# Patient Record
Sex: Female | Born: 1985 | Race: Black or African American | Hispanic: No | Marital: Single | Smoking: Never smoker
Health system: Southern US, Community
[De-identification: ages and names within clinical notes are randomized; demographics above are authoritative.]

## PROBLEM LIST (undated history)

## (undated) ENCOUNTER — Inpatient Hospital Stay (HOSPITAL_COMMUNITY): Payer: Self-pay

## (undated) DIAGNOSIS — D573 Sickle-cell trait: Secondary | ICD-10-CM

## (undated) DIAGNOSIS — R87629 Unspecified abnormal cytological findings in specimens from vagina: Secondary | ICD-10-CM

## (undated) DIAGNOSIS — B999 Unspecified infectious disease: Secondary | ICD-10-CM

## (undated) DIAGNOSIS — O021 Missed abortion: Secondary | ICD-10-CM

## (undated) DIAGNOSIS — F329 Major depressive disorder, single episode, unspecified: Secondary | ICD-10-CM

## (undated) DIAGNOSIS — D649 Anemia, unspecified: Secondary | ICD-10-CM

## (undated) DIAGNOSIS — IMO0002 Reserved for concepts with insufficient information to code with codable children: Secondary | ICD-10-CM

## (undated) DIAGNOSIS — F32A Depression, unspecified: Secondary | ICD-10-CM

## (undated) DIAGNOSIS — F99 Mental disorder, not otherwise specified: Secondary | ICD-10-CM

## (undated) DIAGNOSIS — R51 Headache: Secondary | ICD-10-CM

## (undated) DIAGNOSIS — F319 Bipolar disorder, unspecified: Secondary | ICD-10-CM

## (undated) DIAGNOSIS — M109 Gout, unspecified: Secondary | ICD-10-CM

## (undated) DIAGNOSIS — F419 Anxiety disorder, unspecified: Secondary | ICD-10-CM

## (undated) HISTORY — DX: Depression, unspecified: F32.A

## (undated) HISTORY — PX: DILATION AND CURETTAGE OF UTERUS: SHX78

## (undated) HISTORY — DX: Headache: R51

## (undated) HISTORY — PX: MOUTH SURGERY: SHX715

## (undated) HISTORY — DX: Reserved for concepts with insufficient information to code with codable children: IMO0002

## (undated) HISTORY — DX: Unspecified infectious disease: B99.9

## (undated) HISTORY — DX: Anemia, unspecified: D64.9

## (undated) HISTORY — DX: Major depressive disorder, single episode, unspecified: F32.9

---
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
## 2006-02-05 ENCOUNTER — Inpatient Hospital Stay (HOSPITAL_COMMUNITY): Admission: AD | Admit: 2006-02-05 | Discharge: 2006-02-05 | Payer: Self-pay | Admitting: Obstetrics

## 2006-04-09 ENCOUNTER — Ambulatory Visit (HOSPITAL_COMMUNITY): Admission: RE | Admit: 2006-04-09 | Discharge: 2006-04-09 | Payer: Self-pay | Admitting: Obstetrics & Gynecology

## 2006-05-19 ENCOUNTER — Inpatient Hospital Stay (HOSPITAL_COMMUNITY): Admission: AD | Admit: 2006-05-19 | Discharge: 2006-05-19 | Payer: Self-pay | Admitting: Obstetrics & Gynecology

## 2006-06-26 ENCOUNTER — Emergency Department: Payer: Self-pay | Admitting: Emergency Medicine

## 2006-07-05 ENCOUNTER — Observation Stay: Payer: Self-pay | Admitting: Unknown Physician Specialty

## 2006-07-20 ENCOUNTER — Inpatient Hospital Stay (HOSPITAL_COMMUNITY): Admission: AD | Admit: 2006-07-20 | Discharge: 2006-07-21 | Payer: Self-pay | Admitting: Family Medicine

## 2006-08-02 ENCOUNTER — Inpatient Hospital Stay (HOSPITAL_COMMUNITY): Admission: AD | Admit: 2006-08-02 | Discharge: 2006-08-05 | Payer: Self-pay | Admitting: Obstetrics

## 2006-08-03 ENCOUNTER — Encounter (INDEPENDENT_AMBULATORY_CARE_PROVIDER_SITE_OTHER): Payer: Self-pay | Admitting: Specialist

## 2006-12-13 ENCOUNTER — Emergency Department (HOSPITAL_COMMUNITY): Admission: EM | Admit: 2006-12-13 | Discharge: 2006-12-13 | Payer: Self-pay | Admitting: Emergency Medicine

## 2007-09-06 ENCOUNTER — Emergency Department (HOSPITAL_COMMUNITY): Admission: EM | Admit: 2007-09-06 | Discharge: 2007-09-06 | Payer: Self-pay | Admitting: Emergency Medicine

## 2008-07-07 ENCOUNTER — Ambulatory Visit (HOSPITAL_COMMUNITY): Admission: RE | Admit: 2008-07-07 | Discharge: 2008-07-07 | Payer: Self-pay | Admitting: Obstetrics & Gynecology

## 2008-08-28 ENCOUNTER — Emergency Department (HOSPITAL_COMMUNITY): Admission: EM | Admit: 2008-08-28 | Discharge: 2008-08-28 | Payer: Self-pay | Admitting: Emergency Medicine

## 2008-09-05 ENCOUNTER — Encounter: Admission: RE | Admit: 2008-09-05 | Discharge: 2008-09-05 | Payer: Self-pay | Admitting: Gastroenterology

## 2008-11-20 ENCOUNTER — Encounter: Admission: RE | Admit: 2008-11-20 | Discharge: 2008-11-20 | Payer: Self-pay | Admitting: Gastroenterology

## 2010-02-09 ENCOUNTER — Emergency Department (HOSPITAL_COMMUNITY): Admission: EM | Admit: 2010-02-09 | Discharge: 2010-02-09 | Payer: Self-pay | Admitting: Emergency Medicine

## 2010-08-21 ENCOUNTER — Inpatient Hospital Stay (HOSPITAL_COMMUNITY)
Admission: AD | Admit: 2010-08-21 | Discharge: 2010-08-21 | Payer: Self-pay | Source: Home / Self Care | Attending: Obstetrics & Gynecology | Admitting: Obstetrics & Gynecology

## 2010-09-08 HISTORY — PX: WISDOM TOOTH EXTRACTION: SHX21

## 2010-09-29 ENCOUNTER — Encounter: Payer: Self-pay | Admitting: Gastroenterology

## 2010-09-29 ENCOUNTER — Inpatient Hospital Stay (HOSPITAL_COMMUNITY)
Admission: AD | Admit: 2010-09-29 | Discharge: 2010-09-29 | Payer: Self-pay | Source: Home / Self Care | Attending: Obstetrics & Gynecology | Admitting: Obstetrics & Gynecology

## 2010-10-01 LAB — COMPREHENSIVE METABOLIC PANEL
ALT: <8 U/L (ref 0–35)
AST: 10 U/L (ref 0–37)
Albumin: 3.1 g/dL — ABNORMAL LOW (ref 3.5–5.2)
Alkaline Phosphatase: 46 U/L (ref 39–117)
BUN: 3 mg/dL — ABNORMAL LOW (ref 6–23)
Chloride: 107 mEq/L (ref 96–112)
GFR calc Af Amer: >60 mL/min (ref >60)
Potassium: 3.7 mEq/L (ref 3.5–5.1)
Sodium: 138 mEq/L (ref 135–145)
Total Bilirubin: 0.3 mg/dL (ref 0.3–1.2)
Total Protein: 6.4 g/dL (ref 6.0–8.3)

## 2010-10-01 LAB — URINALYSIS, ROUTINE W REFLEX MICROSCOPIC
Bilirubin Urine: NEGATIVE
Hgb urine dipstick: NEGATIVE
Specific Gravity, Urine: 1.02 (ref 1.005–1.030)
Urine Glucose, Fasting: NEGATIVE mg/dL
Urobilinogen, UA: 0.2 mg/dL (ref 0.0–1.0)

## 2010-10-01 LAB — CBC
MCV: 79 fL (ref 78.0–100.0)
Platelets: 226 10*3/uL (ref 150–400)
RBC: 3.9 MIL/uL (ref 3.87–5.11)
WBC: 8.7 10*3/uL (ref 4.0–10.5)

## 2010-10-01 LAB — WET PREP, GENITAL: Trich, Wet Prep: NONE SEEN

## 2010-10-18 ENCOUNTER — Inpatient Hospital Stay (HOSPITAL_COMMUNITY)
Admission: AD | Admit: 2010-10-18 | Discharge: 2010-10-18 | Disposition: A | Discharge status: Home / Self Care | Payer: Medicaid Other | Source: Ambulatory Visit | Attending: Obstetrics & Gynecology | Admitting: Obstetrics & Gynecology

## 2010-10-18 DIAGNOSIS — B370 Candidal stomatitis: Secondary | ICD-10-CM | POA: Insufficient documentation

## 2010-10-18 DIAGNOSIS — R05 Cough: Secondary | ICD-10-CM | POA: Insufficient documentation

## 2010-10-18 DIAGNOSIS — J029 Acute pharyngitis, unspecified: Secondary | ICD-10-CM | POA: Insufficient documentation

## 2010-10-18 DIAGNOSIS — J069 Acute upper respiratory infection, unspecified: Secondary | ICD-10-CM | POA: Insufficient documentation

## 2010-10-18 DIAGNOSIS — R059 Cough, unspecified: Secondary | ICD-10-CM | POA: Insufficient documentation

## 2010-10-18 DIAGNOSIS — O9989 Other specified diseases and conditions complicating pregnancy, childbirth and the puerperium: Secondary | ICD-10-CM

## 2010-10-18 DIAGNOSIS — O99891 Other specified diseases and conditions complicating pregnancy: Secondary | ICD-10-CM | POA: Insufficient documentation

## 2010-10-18 LAB — URINALYSIS, ROUTINE W REFLEX MICROSCOPIC
Bilirubin Urine: NEGATIVE
Ketones, ur: NEGATIVE mg/dL
Specific Gravity, Urine: 1.015 (ref 1.005–1.030)
Urine Glucose, Fasting: NEGATIVE mg/dL
pH: 8 (ref 5.0–8.0)

## 2010-10-18 LAB — WET PREP, GENITAL: Trich, Wet Prep: NONE SEEN

## 2010-10-22 LAB — GC/CHLAMYDIA PROBE AMP, GENITAL: Chlamydia, DNA Probe: NEGATIVE

## 2010-11-18 LAB — URINALYSIS, ROUTINE W REFLEX MICROSCOPIC
Bilirubin Urine: NEGATIVE
Glucose, UA: NEGATIVE mg/dL
Ketones, ur: 15 mg/dL — AB
Nitrite: NEGATIVE
Protein, ur: NEGATIVE mg/dL
pH: 6 (ref 5.0–8.0)

## 2010-11-18 LAB — WET PREP, GENITAL: Trich, Wet Prep: NONE SEEN

## 2010-11-18 LAB — GC/CHLAMYDIA PROBE AMP, GENITAL
Chlamydia, DNA Probe: NEGATIVE
GC Probe Amp, Genital: NEGATIVE

## 2010-11-30 ENCOUNTER — Inpatient Hospital Stay (HOSPITAL_COMMUNITY)
Admission: AD | Admit: 2010-11-30 | Discharge: 2010-12-01 | Disposition: A | Discharge status: Home / Self Care | Payer: Medicaid Other | Source: Ambulatory Visit | Attending: Obstetrics | Admitting: Obstetrics

## 2010-11-30 DIAGNOSIS — K5289 Other specified noninfective gastroenteritis and colitis: Secondary | ICD-10-CM | POA: Insufficient documentation

## 2010-11-30 DIAGNOSIS — R109 Unspecified abdominal pain: Secondary | ICD-10-CM | POA: Insufficient documentation

## 2010-11-30 DIAGNOSIS — O9989 Other specified diseases and conditions complicating pregnancy, childbirth and the puerperium: Secondary | ICD-10-CM

## 2010-11-30 DIAGNOSIS — O99891 Other specified diseases and conditions complicating pregnancy: Secondary | ICD-10-CM | POA: Insufficient documentation

## 2010-12-01 LAB — URINALYSIS, ROUTINE W REFLEX MICROSCOPIC
Bilirubin Urine: NEGATIVE
Ketones, ur: 15 mg/dL — AB
Nitrite: NEGATIVE
Urobilinogen, UA: 1 mg/dL (ref 0.0–1.0)
pH: 6 (ref 5.0–8.0)
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
## 2011-01-28 ENCOUNTER — Inpatient Hospital Stay (HOSPITAL_COMMUNITY)
Admission: AD | Admit: 2011-01-28 | Discharge: 2011-01-28 | Disposition: A | Discharge status: Home / Self Care | Payer: Medicaid Other | Source: Ambulatory Visit | Attending: Obstetrics | Admitting: Obstetrics

## 2011-01-28 ENCOUNTER — Inpatient Hospital Stay (HOSPITAL_COMMUNITY): Payer: Medicaid Other

## 2011-01-28 DIAGNOSIS — O9989 Other specified diseases and conditions complicating pregnancy, childbirth and the puerperium: Secondary | ICD-10-CM

## 2011-01-28 DIAGNOSIS — O99891 Other specified diseases and conditions complicating pregnancy: Secondary | ICD-10-CM | POA: Insufficient documentation

## 2011-01-28 DIAGNOSIS — R109 Unspecified abdominal pain: Secondary | ICD-10-CM | POA: Insufficient documentation

## 2011-01-28 DIAGNOSIS — O210 Mild hyperemesis gravidarum: Secondary | ICD-10-CM | POA: Insufficient documentation

## 2011-01-28 LAB — URINALYSIS, ROUTINE W REFLEX MICROSCOPIC
Bilirubin Urine: NEGATIVE
Glucose, UA: NEGATIVE mg/dL
Hgb urine dipstick: NEGATIVE
Specific Gravity, Urine: 1.02 (ref 1.005–1.030)
pH: 6 (ref 5.0–8.0)

## 2011-01-28 LAB — URINE MICROSCOPIC-ADD ON

## 2011-01-28 LAB — OB RESULTS CONSOLE GC/CHLAMYDIA: Gonorrhea: NEGATIVE

## 2011-01-29 LAB — GC/CHLAMYDIA PROBE AMP, GENITAL: GC Probe Amp, Genital: NEGATIVE

## 2011-01-31 LAB — URINE CULTURE: Colony Count: 30000

## 2011-02-03 ENCOUNTER — Observation Stay: Payer: Self-pay | Admitting: Obstetrics and Gynecology

## 2011-02-03 ENCOUNTER — Inpatient Hospital Stay (HOSPITAL_COMMUNITY)
Admission: AD | Admit: 2011-02-03 | Discharge: 2011-02-03 | Disposition: A | Discharge status: Home / Self Care | Payer: Medicaid Other | Source: Ambulatory Visit | Attending: Obstetrics & Gynecology | Admitting: Obstetrics & Gynecology

## 2011-02-03 DIAGNOSIS — O4700 False labor before 37 completed weeks of gestation, unspecified trimester: Secondary | ICD-10-CM | POA: Insufficient documentation

## 2011-02-03 LAB — URINALYSIS, ROUTINE W REFLEX MICROSCOPIC
Bilirubin Urine: NEGATIVE
Nitrite: NEGATIVE
Specific Gravity, Urine: 1.02 (ref 1.005–1.030)
Urobilinogen, UA: 0.2 mg/dL (ref 0.0–1.0)

## 2011-02-03 LAB — URINE MICROSCOPIC-ADD ON

## 2011-02-13 ENCOUNTER — Ambulatory Visit (HOSPITAL_COMMUNITY)
Admission: RE | Admit: 2011-02-13 | Discharge: 2011-02-13 | Disposition: A | Discharge status: Home / Self Care | Payer: Medicaid Other | Source: Ambulatory Visit | Attending: Obstetrics & Gynecology | Admitting: Obstetrics & Gynecology

## 2011-02-13 ENCOUNTER — Other Ambulatory Visit: Payer: Self-pay | Admitting: Obstetrics & Gynecology

## 2011-02-13 DIAGNOSIS — O288 Other abnormal findings on antenatal screening of mother: Secondary | ICD-10-CM

## 2011-02-13 DIAGNOSIS — Z3689 Encounter for other specified antenatal screening: Secondary | ICD-10-CM | POA: Insufficient documentation

## 2011-02-13 DIAGNOSIS — O368390 Maternal care for abnormalities of the fetal heart rate or rhythm, unspecified trimester, not applicable or unspecified: Secondary | ICD-10-CM | POA: Insufficient documentation

## 2011-02-18 LAB — OB RESULTS CONSOLE GC/CHLAMYDIA
Chlamydia: NEGATIVE
Gonorrhea: NEGATIVE

## 2011-02-19 ENCOUNTER — Inpatient Hospital Stay (HOSPITAL_COMMUNITY)
Admission: AD | Admit: 2011-02-19 | Discharge: 2011-02-19 | Disposition: A | Discharge status: Home / Self Care | Payer: Medicaid Other | Source: Ambulatory Visit | Attending: Obstetrics | Admitting: Obstetrics

## 2011-02-19 DIAGNOSIS — R0602 Shortness of breath: Secondary | ICD-10-CM | POA: Insufficient documentation

## 2011-02-19 DIAGNOSIS — O99891 Other specified diseases and conditions complicating pregnancy: Secondary | ICD-10-CM | POA: Insufficient documentation

## 2011-02-28 ENCOUNTER — Inpatient Hospital Stay (HOSPITAL_COMMUNITY)
Admission: AD | Admit: 2011-02-28 | Discharge: 2011-02-28 | Disposition: A | Discharge status: Home / Self Care | Payer: Medicaid Other | Source: Ambulatory Visit | Attending: Obstetrics & Gynecology | Admitting: Obstetrics & Gynecology

## 2011-02-28 DIAGNOSIS — Z79899 Other long term (current) drug therapy: Secondary | ICD-10-CM | POA: Insufficient documentation

## 2011-02-28 DIAGNOSIS — O99340 Other mental disorders complicating pregnancy, unspecified trimester: Secondary | ICD-10-CM | POA: Insufficient documentation

## 2011-02-28 DIAGNOSIS — D573 Sickle-cell trait: Secondary | ICD-10-CM | POA: Insufficient documentation

## 2011-02-28 DIAGNOSIS — O9989 Other specified diseases and conditions complicating pregnancy, childbirth and the puerperium: Secondary | ICD-10-CM

## 2011-02-28 DIAGNOSIS — R109 Unspecified abdominal pain: Secondary | ICD-10-CM

## 2011-02-28 DIAGNOSIS — R197 Diarrhea, unspecified: Secondary | ICD-10-CM | POA: Insufficient documentation

## 2011-02-28 DIAGNOSIS — J45909 Unspecified asthma, uncomplicated: Secondary | ICD-10-CM | POA: Insufficient documentation

## 2011-02-28 DIAGNOSIS — F319 Bipolar disorder, unspecified: Secondary | ICD-10-CM

## 2011-02-28 DIAGNOSIS — O99891 Other specified diseases and conditions complicating pregnancy: Secondary | ICD-10-CM | POA: Insufficient documentation

## 2011-02-28 LAB — URINALYSIS, ROUTINE W REFLEX MICROSCOPIC
Bilirubin Urine: NEGATIVE
Hgb urine dipstick: NEGATIVE
Ketones, ur: NEGATIVE mg/dL
Nitrite: NEGATIVE
Protein, ur: NEGATIVE mg/dL
Urobilinogen, UA: 0.2 mg/dL (ref 0.0–1.0)

## 2011-02-28 LAB — URINE MICROSCOPIC-ADD ON

## 2011-03-10 ENCOUNTER — Inpatient Hospital Stay (HOSPITAL_COMMUNITY)
Admission: AD | Admit: 2011-03-10 | Discharge: 2011-03-11 | DRG: 775 | Disposition: A | Discharge status: Home / Self Care | Payer: Medicaid Other | Source: Ambulatory Visit | Attending: Obstetrics | Admitting: Obstetrics

## 2011-03-10 ENCOUNTER — Other Ambulatory Visit: Payer: Self-pay | Admitting: Obstetrics & Gynecology

## 2011-03-10 DIAGNOSIS — O328XX0 Maternal care for other malpresentation of fetus, not applicable or unspecified: Secondary | ICD-10-CM | POA: Diagnosis present

## 2011-03-10 LAB — CBC
HCT: 33.1 % — ABNORMAL LOW (ref 36.0–46.0)
Hemoglobin: 10.8 g/dL — ABNORMAL LOW (ref 12.0–15.0)
MCH: 25.7 pg — ABNORMAL LOW (ref 26.0–34.0)
MCHC: 32.6 g/dL (ref 30.0–36.0)
RBC: 4.2 MIL/uL (ref 3.87–5.11)

## 2011-03-10 LAB — RPR: RPR Ser Ql: NONREACTIVE

## 2011-03-11 LAB — CBC
HCT: 27.1 % — ABNORMAL LOW (ref 36.0–46.0)
Hemoglobin: 9 g/dL — ABNORMAL LOW (ref 12.0–15.0)
MCHC: 33.2 g/dL (ref 30.0–36.0)
MCV: 78.8 fL (ref 78.0–100.0)
RDW: 14.6 % (ref 11.5–15.5)

## 2011-03-14 NOTE — H&P (Signed)
  Lindsay Ramirez, Lindsay Ramirez             ACCOUNT NO.:  [id number]  MEDICAL RECORD NO.:  [id number]  LOCATION:  9121                          FACILITY:  [facility]  PHYSICIAN:  [name], M.D.DATE OF BIRTH:  [birth year]  DATE OF ADMISSION:  03/10/2011 DATE OF DISCHARGE:                             HISTORY & PHYSICAL   CHIEF COMPLAINT:  The patient is a 25 year old para 1 with an estimated date of confinement of March 15, 2011 with an intrauterine pregnancy at 39 plus weeks complaining of rupture of membranes.  HISTORY OF PRESENT ILLNESS:  Please see the above.  ALLERGIES:  No known drug allergies.  MEDICATIONS:  Please see the medication reconciliation form.  OB RISK FACTORS:  Dental caries, sickle cell trait, history of depression, asthma.  PRENATAL LABS:  Chlamydia probe negative.  Urine culture and sensitivity insignificant growth.  GC probe negative.  Hepatitis B surface antigen negative.  Hematocrit 32.1, hemoglobin 10.9, HIV nonreactive, platelets 256,000.  Blood type is O+, antibody screen negative, RPR nonreactive, rubella immune.  Sickle cell positive.  Varicella immune.  GBS negative on February 20, 2011.  PAST OB HISTORY:  In November 2007, she was delivered at term 7 pounds and 6 ounces female, vaginal delivery, no complications.  PAST MEDICAL HISTORY:  Please see the above.  There is a history of hepatitis.  SOCIAL HISTORY:  She denies any tobacco, ethanol, or drug use.  PAST SURGICAL HISTORY:  She denies.  REVIEW OF SYSTEMS:  GU:  Please see the above.  PHYSICAL EXAMINATION:  VITAL SIGNS:  Stable, afebrile.  The fetal heart tracing is 140 beats per minute, moderate long-term variability.  No decelerations.  Uterine contractions every 5 minutes.  Sterile vaginal exam per the RN.  The cervix is 3-4 cm dilated.  ASSESSMENT:  Primipara at term, latent labor.  Fetal heart tracing consistent with fetal well-being, category 1.  GBS negative.  PLAN:  Admission,  anticipate a spontaneous vaginal delivery.     [name], M.D.     Judee Clara  D:  03/10/2011  T:  03/10/2011  Job:  [id number]  Electronically Signed by Antionette Char M.D. on 03/14/2011 03:46:55 PM

## 2011-05-01 ENCOUNTER — Inpatient Hospital Stay (INDEPENDENT_AMBULATORY_CARE_PROVIDER_SITE_OTHER)
Admission: RE | Admit: 2011-05-01 | Discharge: 2011-05-01 | Disposition: A | Discharge status: Home / Self Care | Payer: Medicaid Other | Source: Ambulatory Visit | Attending: Family Medicine | Admitting: Family Medicine

## 2011-05-01 DIAGNOSIS — K029 Dental caries, unspecified: Secondary | ICD-10-CM

## 2011-06-25 ENCOUNTER — Inpatient Hospital Stay (HOSPITAL_COMMUNITY): Payer: Medicaid Other

## 2011-06-25 ENCOUNTER — Encounter (HOSPITAL_COMMUNITY): Payer: Self-pay | Admitting: *Deleted

## 2011-06-25 ENCOUNTER — Inpatient Hospital Stay (HOSPITAL_COMMUNITY)
Admission: AD | Admit: 2011-06-25 | Discharge: 2011-06-25 | Disposition: A | Discharge status: Home / Self Care | Payer: Medicaid Other | Source: Ambulatory Visit | Attending: Obstetrics & Gynecology | Admitting: Obstetrics & Gynecology

## 2011-06-25 DIAGNOSIS — O26899 Other specified pregnancy related conditions, unspecified trimester: Secondary | ICD-10-CM

## 2011-06-25 DIAGNOSIS — R109 Unspecified abdominal pain: Secondary | ICD-10-CM | POA: Insufficient documentation

## 2011-06-25 DIAGNOSIS — O99891 Other specified diseases and conditions complicating pregnancy: Secondary | ICD-10-CM | POA: Insufficient documentation

## 2011-06-25 HISTORY — DX: Sickle-cell trait: D57.3

## 2011-06-25 LAB — URINALYSIS, ROUTINE W REFLEX MICROSCOPIC
Bilirubin Urine: NEGATIVE
Glucose, UA: NEGATIVE mg/dL
Hgb urine dipstick: NEGATIVE
Protein, ur: NEGATIVE mg/dL
Specific Gravity, Urine: 1.02 (ref 1.005–1.030)

## 2011-06-25 LAB — POCT PREGNANCY, URINE: Preg Test, Ur: POSITIVE

## 2011-06-25 NOTE — ED Provider Notes (Signed)
History   Pt presents today c/o possible early pregnancy. She states she recently had some dental work and was placed on hydrocodone and was worried this could affect her baby. She also c/o some lower abd cramping. She denies vag dc or bleeding. She denies fever or any other sx at this time.  Chief Complaint  Patient presents with  . Abdominal Pain   HPI  OB History    No data available      No past medical history on file.  No past surgical history on file.  No family history on file.  History  Substance Use Topics  . Smoking status: Not on file  . Smokeless tobacco: Not on file  . Alcohol Use: Not on file    Allergies: Allergies not on file  No prescriptions prior to admission    Review of Systems  Constitutional: Negative for fever.  Cardiovascular: Negative for chest pain.  Gastrointestinal: Positive for nausea, vomiting and abdominal pain. Negative for diarrhea, constipation and blood in stool.  Genitourinary: Negative for dysuria, urgency, frequency and hematuria.  Neurological: Negative for dizziness and headaches.  Psychiatric/Behavioral: Negative for depression and suicidal ideas.   Physical Exam   Blood pressure 122/67, pulse 56, temperature 98.6 F (37 C), resp. rate 20, height 5\' 1"  (1.549 m), weight 172 lb 3.2 oz (78.109 kg), last menstrual period 04/12/2011, SpO2 99.00%.  Physical Exam  Nursing note and vitals reviewed. Constitutional: She is oriented to person, place, and time. She appears well-developed and well-nourished. No distress.  HENT:  Head: Normocephalic and atraumatic.  Eyes: EOM are normal. Pupils are equal, round, and reactive to light.  GI: Soft. She exhibits no distension. There is no tenderness. There is no rebound and no guarding.  Neurological: She is alert and oriented to person, place, and time.  Skin: Skin is warm and dry. She is not diaphoretic.  Psychiatric: She has a normal mood and affect. Her behavior is normal. Judgment  and thought content normal.    MAU Course  Procedures  US shows IUP with yolk sac consistent with 5.4wks with an EDC of 02/21/12.  Results for orders placed during the hospital encounter of 06/25/11 (from the past 24 hour(s))  URINALYSIS, ROUTINE W REFLEX MICROSCOPIC     Status: Normal   Collection Time   06/25/11  3:30 PM      Component Value Range   Color, Urine YELLOW  YELLOW    Appearance CLEAR  CLEAR    Specific Gravity, Urine 1.020  1.005 - 1.030    pH 6.5  5.0 - 8.0    Glucose, UA NEGATIVE  NEGATIVE (mg/dL)   Hgb urine dipstick NEGATIVE  NEGATIVE    Bilirubin Urine NEGATIVE  NEGATIVE    Ketones, ur NEGATIVE  NEGATIVE (mg/dL)   Protein, ur NEGATIVE  NEGATIVE (mg/dL)   Urobilinogen, UA 1.0  0.0 - 1.0 (mg/dL)   Nitrite NEGATIVE  NEGATIVE    Leukocytes, UA NEGATIVE  NEGATIVE   POCT PREGNANCY, URINE     Status: Normal   Collection Time   06/25/11  3:35 PM      Component Value Range   Preg Test, Ur POSITIVE       Assessment and Plan  Pregnancy: discussed with pt at length. She will begin prenatal care. Discussed diet, activity, risks, and precautions.  Clinton Gallant. Rice III, DrHSc, MPAS, PA-C  06/25/2011, 3:42 PM   Henrietta Hoover, PA 06/25/11 1718

## 2011-06-25 NOTE — Progress Notes (Signed)
Pt states she had oral surgery on Monday. That afternoon started having abdominal pain and nausea. Has taken two HPT's that have both been POS.  Has been taking pain medication and is concerned about the pregnancy.

## 2011-08-05 LAB — OB RESULTS CONSOLE ANTIBODY SCREEN: Antibody Screen: NEGATIVE

## 2011-08-05 LAB — OB RESULTS CONSOLE VARICELLA ZOSTER ANTIBODY, IGG: Varicella: IMMUNE

## 2011-08-05 LAB — OB RESULTS CONSOLE ABO/RH: RH Type: POSITIVE

## 2011-08-05 LAB — OB RESULTS CONSOLE HIV ANTIBODY (ROUTINE TESTING): HIV: NONREACTIVE

## 2011-09-09 NOTE — L&D Delivery Note (Signed)
This patient has no babies on file.Delivery Note At 7:58 PM a viable female was delivered via Vaginal, Spontaneous Delivery (Presentation: ;  ).  APGAR: , ; weight .   Placenta status: , .  Cord:  with the following complications: .  Cord pH: not done  Anesthesia:   Episiotomy:  Lacerations:  Suture Repair: 2.0 Est. Blood Loss (mL):   Mom to postpartum.  Baby to nursery-stable.  Urie Loughner A 02/14/2012, 8:12 PM

## 2011-10-04 ENCOUNTER — Encounter (HOSPITAL_COMMUNITY): Payer: Self-pay | Admitting: Obstetrics and Gynecology

## 2011-10-04 ENCOUNTER — Inpatient Hospital Stay (HOSPITAL_COMMUNITY)
Admission: AD | Admit: 2011-10-04 | Discharge: 2011-10-04 | Disposition: A | Discharge status: Home / Self Care | Payer: Medicaid Other | Source: Ambulatory Visit | Attending: Obstetrics | Admitting: Obstetrics

## 2011-10-04 DIAGNOSIS — O2340 Unspecified infection of urinary tract in pregnancy, unspecified trimester: Secondary | ICD-10-CM

## 2011-10-04 DIAGNOSIS — N949 Unspecified condition associated with female genital organs and menstrual cycle: Secondary | ICD-10-CM

## 2011-10-04 DIAGNOSIS — N390 Urinary tract infection, site not specified: Secondary | ICD-10-CM | POA: Insufficient documentation

## 2011-10-04 DIAGNOSIS — R109 Unspecified abdominal pain: Secondary | ICD-10-CM | POA: Insufficient documentation

## 2011-10-04 DIAGNOSIS — O2390 Unspecified genitourinary tract infection in pregnancy, unspecified trimester: Secondary | ICD-10-CM | POA: Insufficient documentation

## 2011-10-04 LAB — URINE MICROSCOPIC-ADD ON

## 2011-10-04 LAB — URINALYSIS, ROUTINE W REFLEX MICROSCOPIC
Glucose, UA: NEGATIVE mg/dL
Nitrite: NEGATIVE
Specific Gravity, Urine: 1.01 (ref 1.005–1.030)
pH: 6 (ref 5.0–8.0)

## 2011-10-04 MED ORDER — NITROFURANTOIN MONOHYD MACRO 100 MG PO CAPS: 100.0000 mg | ORAL_CAPSULE | Freq: Two times a day (BID) | ORAL | Status: AC

## 2011-10-04 NOTE — Progress Notes (Signed)
Painful urination x 2 days.

## 2011-10-04 NOTE — Progress Notes (Signed)
Onset of back and pelvic pain two days worse today, lower back pain and lower abdominal pain, worse when walking and changing position, G3P2

## 2011-10-04 NOTE — Progress Notes (Signed)
Pt presents to MAU with chief complaint of pelvic and back pain. Pt is [redacted] weeks pregnant, says the pain started 3 days ago and has gotten worse. The pain is described as sharp, shooting pain down her pelvis and back.

## 2011-10-04 NOTE — ED Provider Notes (Signed)
History    G3P2002 presents at [redacted]w[redacted]d with report of lower abdominal pain, described as constant sharp pain.  Pt points to painful areas indicating inguinal and suprapubic pain.  She reports urinary frequency x3 days.  Pt reports pain x3 days with worsening pain today.  She denies LOF, vaginal bleeding, unusual vaginal discharge or itching.     Chief Complaint  Patient presents with  . Pelvic Pain  . Back Pain   HPI  OB History    Grav Para Term Preterm Abortions TAB SAB Ect Mult Living   3 2 2  0 0 0 0 0 0 2      Past Medical History  Diagnosis Date  . Asthma   . Sickle cell trait   . Hepatitis B carrier     Past Surgical History  Procedure Date  . Mouth surgery     Family History  Problem Relation Age of Onset  . Hypertension Mother   . Asthma Mother   . Sickle cell trait Mother     History  Substance Use Topics  . Smoking status: Never Smoker   . Smokeless tobacco: Not on file  . Alcohol Use: No    Allergies: No Known Allergies  No prescriptions prior to admission    Review of Systems  Gastrointestinal: Positive for abdominal pain.  Genitourinary: Positive for frequency.   Physical Exam   Blood pressure 108/57, pulse 71, temperature 99.1 F (37.3 C), temperature source Oral, height 5\' 2"  (1.575 m), weight 80.196 kg (176 lb 12.8 oz), last menstrual period 04/12/2011.  Physical Exam  Constitutional: She is oriented to person, place, and time. She appears well-developed and well-nourished.  Respiratory: Effort normal.  GI: Soft.  Neurological: She is alert and oriented to person, place, and time.  Skin: Skin is warm and dry.  Psychiatric: She has a normal mood and affect. Her behavior is normal. Judgment and thought content normal.   Results for orders placed during the hospital encounter of 10/04/11 (from the past 48 hour(s))  URINALYSIS, ROUTINE W REFLEX MICROSCOPIC     Status: Abnormal   Collection Time   10/04/11  6:40 PM      Component Value  Range Comment   Color, Urine YELLOW  YELLOW     APPearance HAZY (*) CLEAR     Specific Gravity, Urine 1.010  1.005 - 1.030     pH 6.0  5.0 - 8.0     Glucose, UA NEGATIVE  NEGATIVE (mg/dL)    Hgb urine dipstick NEGATIVE  NEGATIVE     Bilirubin Urine NEGATIVE  NEGATIVE     Ketones, ur NEGATIVE  NEGATIVE (mg/dL)    Protein, ur NEGATIVE  NEGATIVE (mg/dL)    Urobilinogen, UA 0.2  0.0 - 1.0 (mg/dL)    Nitrite NEGATIVE  NEGATIVE     Leukocytes, UA SMALL (*) NEGATIVE    URINE MICROSCOPIC-ADD ON     Status: Abnormal   Collection Time   10/04/11  6:40 PM      Component Value Range Comment   Squamous Epithelial / LPF MANY (*) RARE     WBC, UA 3-6  <3 (WBC/hpf)    RBC / HPF 0-2  <3 (RBC/hpf)    Bacteria, UA MANY (*) RARE     MAU Course  Procedures U/A Pelvic exam with wet prep, GC/Chlamydia Urine sent for culture  Discussed POC with Dr Clearance Coots and he agrees with POC to treat UTI, sent urine for culture, and D/C home.  Assessment  and Plan  A: Urinary tract infection Round ligament pain  P: D/C home Prescription for Macrobid BID 7 days Discussed use of warm, moist heat, belly support bands, and good body mechanics for round ligament pain  LEFTWICH-KIRBY, Aletta Edmunds 10/04/2011, 7:17 PM

## 2011-10-05 NOTE — ED Provider Notes (Signed)
Attestation of Attending Supervision of Advanced Practitioner: Evaluation and management procedures were performed by the PA/NP/CNM/OB Fellow under my supervision/collaboration. Chart reviewed, and agree with management and plan.  Traniyah Hallett, M.D. 10/05/2011 7:34 AM 

## 2011-12-10 ENCOUNTER — Inpatient Hospital Stay (HOSPITAL_COMMUNITY): Payer: Medicaid Other

## 2011-12-10 ENCOUNTER — Inpatient Hospital Stay (HOSPITAL_COMMUNITY)
Admission: AD | Admit: 2011-12-10 | Discharge: 2011-12-10 | Disposition: A | Discharge status: Home / Self Care | Payer: Medicaid Other | Source: Ambulatory Visit | Attending: Obstetrics | Admitting: Obstetrics

## 2011-12-10 ENCOUNTER — Encounter (HOSPITAL_COMMUNITY): Payer: Self-pay | Admitting: *Deleted

## 2011-12-10 DIAGNOSIS — O99891 Other specified diseases and conditions complicating pregnancy: Secondary | ICD-10-CM | POA: Insufficient documentation

## 2011-12-10 DIAGNOSIS — M549 Dorsalgia, unspecified: Secondary | ICD-10-CM | POA: Insufficient documentation

## 2011-12-10 DIAGNOSIS — O26899 Other specified pregnancy related conditions, unspecified trimester: Secondary | ICD-10-CM

## 2011-12-10 LAB — URINALYSIS, ROUTINE W REFLEX MICROSCOPIC
Bilirubin Urine: NEGATIVE
Hgb urine dipstick: NEGATIVE
Ketones, ur: NEGATIVE mg/dL
Protein, ur: NEGATIVE mg/dL
Urobilinogen, UA: 1 mg/dL (ref 0.0–1.0)

## 2011-12-10 LAB — WET PREP, GENITAL: Trich, Wet Prep: NONE SEEN

## 2011-12-10 NOTE — MAU Note (Signed)
Patient state she has been having a watery white discharge that runs down her legs for about one month. Has been having lower back pain and right abdominal pain for about one month. Patient is not wearing a pad.Reports good fetal movement, had some blood on the tissue yesterday, none today.

## 2011-12-10 NOTE — Discharge Instructions (Signed)

## 2011-12-10 NOTE — MAU Provider Note (Signed)
History     CSN: [id number]  Arrival date and time: 12/10/11 1339   First Provider Initiated Contact with Patient 12/10/11 1426      Chief Complaint  Patient presents with  . Vaginal Discharge   HPI MAKELLE MARRONE is 26 y.o. [id number] [redacted]w[redacted]d weeks presenting with "liquid running out".  Patient of Dr. Thomes Lolling.  States she has been leaking fluid for 1 month, most when she sneezes.  States she is having back that is constant and pain on her sides that is intermittent.   Reports spotting yesterday.  Nausea the entire pregnancy.   Past Medical History  Diagnosis Date  . Asthma   . Sickle cell trait   . Hepatitis B carrier     Past Surgical History  Procedure Date  . Mouth surgery     Family History  Problem Relation Age of Onset  . Hypertension Mother   . Asthma Mother   . Sickle cell trait Mother     History  Substance Use Topics  . Smoking status: Never Smoker   . Smokeless tobacco: Not on file  . Alcohol Use: No    Allergies: No Known Allergies  Prescriptions prior to admission  Medication Sig Dispense Refill  . albuterol (PROVENTIL HFA;VENTOLIN HFA) 108 (90 BASE) MCG/ACT inhaler Inhale 2 puffs into the lungs every 6 (six) hours as needed. For asthma. Pt has not needed in a long time        Review of Systems  Gastrointestinal: Positive for nausea and vomiting.  Genitourinary:       +_ for leaking of ?urine/fluid + Spotted yesterday   Physical Exam   Blood pressure 111/60, pulse 78, temperature 97.6 F (36.4 C), temperature source Oral, resp. rate 16, height 5\' 2"  (1.575 m), weight 86.456 kg (190 lb 9.6 oz), last menstrual period 04/12/2011, SpO2 100.00%.  Physical Exam  Constitutional: She is oriented to person, place, and time. She appears well-developed and well-nourished.  HENT:  Head: Normocephalic.  Neck: Normal range of motion.  Cardiovascular: Normal rate.   Respiratory: Effort normal.  GI: Soft. There is no tenderness. There is no rebound and  no guarding.  Genitourinary: Uterus is enlarged (gravid). Uterus is not tender. Cervix exhibits no motion tenderness, no discharge and no friability. No bleeding around the vagina. Vaginal discharge (small amount of white discharge without odor) found.  Neurological: She is alert and oriented to person, place, and time.  Skin: Skin is warm and dry.  Psychiatric: She has a normal mood and affect. Her behavior is normal.   Results for orders placed during the hospital encounter of 12/10/11 (from the past 24 hour(s))  URINALYSIS, ROUTINE W REFLEX MICROSCOPIC     Status: Normal   Collection Time   12/10/11  1:55 PM      Component Value Range   Color, Urine YELLOW  YELLOW    APPearance CLEAR  CLEAR    Specific Gravity, Urine 1.015  1.005 - 1.030    pH 6.5  5.0 - 8.0    Glucose, UA NEGATIVE  NEGATIVE (mg/dL)   Hgb urine dipstick NEGATIVE  NEGATIVE    Bilirubin Urine NEGATIVE  NEGATIVE    Ketones, ur NEGATIVE  NEGATIVE (mg/dL)   Protein, ur NEGATIVE  NEGATIVE (mg/dL)   Urobilinogen, UA 1.0  0.0 - 1.0 (mg/dL)   Nitrite NEGATIVE  NEGATIVE    Leukocytes, UA NEGATIVE  NEGATIVE   WET PREP, GENITAL     Status: Abnormal   Collection Time  12/10/11  2:30 PM      Component Value Range   Yeast Wet Prep HPF POC NONE SEEN  NONE SEEN    Trich, Wet Prep NONE SEEN  NONE SEEN    Clue Cells Wet Prep HPF POC NONE SEEN  NONE SEEN    WBC, Wet Prep HPF POC MANY (*) NONE SEEN     Ultrasound:  Single live IUP in cephalic presentation.  No Acute abnormality.  Subj and quantitatively normal amniotic fluid volume.  [redacted]w[redacted]d gestation.  Cervical length 3.5cm.  FHR 153 Placenta posterior.  EDD 02/21/12. MAU Course  Procedures   FMS  Baseline FHR 140, no contractions seen.                        FERN negative, + sperm on slide  MDM 15:18  Reported patient MSE, FMS and lab results.  Order given for a limited U/S to check amniotic fluid.   Assessment and Plan  A; 29w4 days gestation    Back pain    Crist Fat is negative      P: May use tylenol prn for pain.     Keep scheduled appointment in the office for prenatal care, call if symptoms worsen.  John Vasconcelos,EVE M 12/10/2011, 2:34 PM

## 2011-12-13 ENCOUNTER — Inpatient Hospital Stay (HOSPITAL_COMMUNITY)
Admission: AD | Admit: 2011-12-13 | Discharge: 2011-12-13 | Disposition: A | Discharge status: Hospice | Payer: Medicaid Other | Source: Ambulatory Visit | Attending: Obstetrics & Gynecology | Admitting: Obstetrics & Gynecology

## 2011-12-13 ENCOUNTER — Encounter (HOSPITAL_COMMUNITY): Payer: Self-pay | Admitting: *Deleted

## 2011-12-13 DIAGNOSIS — A0811 Acute gastroenteropathy due to Norwalk agent: Secondary | ICD-10-CM

## 2011-12-13 DIAGNOSIS — O219 Vomiting of pregnancy, unspecified: Secondary | ICD-10-CM

## 2011-12-13 DIAGNOSIS — O212 Late vomiting of pregnancy: Secondary | ICD-10-CM | POA: Insufficient documentation

## 2011-12-13 DIAGNOSIS — R197 Diarrhea, unspecified: Secondary | ICD-10-CM | POA: Insufficient documentation

## 2011-12-13 LAB — DIFFERENTIAL
Basophils Absolute: 0 10*3/uL (ref 0.0–0.1)
Basophils Relative: 0 % (ref 0–1)
Eosinophils Absolute: 0 10*3/uL (ref 0.0–0.7)
Lymphs Abs: 1.3 10*3/uL (ref 0.7–4.0)
Neutrophils Relative %: 83 % — ABNORMAL HIGH (ref 43–77)

## 2011-12-13 LAB — COMPREHENSIVE METABOLIC PANEL
ALT: 6 U/L (ref 0–35)
Albumin: 2.7 g/dL — ABNORMAL LOW (ref 3.5–5.2)
Alkaline Phosphatase: 66 U/L (ref 39–117)
Potassium: 3 mEq/L — ABNORMAL LOW (ref 3.5–5.1)
Sodium: 136 mEq/L (ref 135–145)
Total Protein: 5.9 g/dL — ABNORMAL LOW (ref 6.0–8.3)

## 2011-12-13 LAB — URINALYSIS, ROUTINE W REFLEX MICROSCOPIC
Bilirubin Urine: NEGATIVE
Nitrite: NEGATIVE
Specific Gravity, Urine: 1.025 (ref 1.005–1.030)
Urobilinogen, UA: 0.2 mg/dL (ref 0.0–1.0)

## 2011-12-13 LAB — CBC
MCH: 26.2 pg (ref 26.0–34.0)
MCHC: 33.1 g/dL (ref 30.0–36.0)
Platelets: 216 10*3/uL (ref 150–400)
RBC: 3.74 MIL/uL — ABNORMAL LOW (ref 3.87–5.11)

## 2011-12-13 MED ORDER — ONDANSETRON 8 MG PO TBDP: 8.0000 mg | ORAL_TABLET | Freq: Three times a day (TID) | ORAL | Status: AC | PRN

## 2011-12-13 MED ORDER — ONDANSETRON HCL 4 MG/2ML IJ SOLN
4.0000 mg | Freq: Once | INTRAMUSCULAR | Status: AC
  Administered 2011-12-13: 4 mg via INTRAVENOUS
  Filled 2011-12-13: qty 2

## 2011-12-13 MED ORDER — LACTATED RINGERS IV SOLN
INTRAVENOUS | Status: DC
  Administered 2011-12-13: 11:00:00 via INTRAVENOUS

## 2011-12-13 NOTE — MAU Note (Signed)
Pt reports waking up at 3am with N/V/D and havign abd cramping and pain. Vomit was blood streaked.

## 2011-12-13 NOTE — MAU Provider Note (Signed)
History     CSN: [id number]  Arrival date & time 12/13/11  0934   None     Chief Complaint  Patient presents with  . Emesis    HPI Lindsay Ramirez is a 26 y.o. female @ [redacted]w[redacted]d gestation who presents to MAU for nausea, vomiting and diarrhea that started approximately 3 am. The patient states that she has had multiple episodes of watery diarrhea and vomiting. Has burning in upper abdomen for vomiting. Denies any other problems.   Past Medical History  Diagnosis Date  . Asthma   . Sickle cell trait   . Hepatitis B carrier     Past Surgical History  Procedure Date  . Mouth surgery     Family History  Problem Relation Age of Onset  . Hypertension Mother   . Asthma Mother   . Sickle cell trait Mother     History  Substance Use Topics  . Smoking status: Never Smoker   . Smokeless tobacco: Not on file  . Alcohol Use: No    OB History    Grav Para Term Preterm Abortions TAB SAB Ect Mult Living   3 2 2  0 0 0 0 0 0 2      Review of Systems  Constitutional: Negative for fever and chills.  HENT: Negative.   Eyes: Negative.   Respiratory: Negative.   Gastrointestinal: Positive for nausea, vomiting, abdominal pain and diarrhea.  Genitourinary: Negative for dysuria, frequency, vaginal bleeding, vaginal discharge and pelvic pain.  Neurological: Negative for dizziness and headaches.  Psychiatric/Behavioral: Negative for confusion and agitation.    Allergies  Review of patient's allergies indicates no known allergies.  Home Medications   Current Outpatient Rx  Name Route Sig Dispense Refill  . ONDANSETRON 8 MG PO TBDP Oral Take 1 tablet (8 mg total) by mouth every 8 (eight) hours as needed for nausea. 20 tablet 0    BP 107/57  Pulse 70  Temp 98.2 F (36.8 C)  Resp 16  Ht 5\' 2"  (1.575 m)  Wt 189 lb 3.2 oz (85.821 kg)  BMI 34.61 kg/m2  LMP 04/12/2011  Physical Exam  Nursing note and vitals reviewed. Constitutional: She is oriented to person, place, and time.  She appears well-developed and well-nourished. No distress.  HENT:  Head: Normocephalic.  Eyes: EOM are normal.  Neck: Neck supple.  Cardiovascular: Normal rate.   Pulmonary/Chest: Effort normal.  Abdominal: Soft. There is tenderness in the epigastric area. There is no rigidity, no rebound and no guarding.  Musculoskeletal: Normal range of motion.  Neurological: She is alert and oriented to person, place, and time. No cranial nerve deficit.  Skin: Skin is warm and dry.  Psychiatric: She has a normal mood and affect. Her behavior is normal. Judgment and thought content normal.   EFM: Reactive tracing  ED Course  Procedures  Labs Reviewed  CBC - Abnormal; Notable for the following:    WBC 11.7 (*)    RBC 3.74 (*)    Hemoglobin 9.8 (*)    HCT 29.6 (*)    All other components within normal limits  DIFFERENTIAL - Abnormal; Notable for the following:    Neutrophils Relative 83 (*)    Neutro Abs 9.7 (*)    Lymphocytes Relative 11 (*)    All other components within normal limits  COMPREHENSIVE METABOLIC PANEL - Abnormal; Notable for the following:    Potassium 3.0 (*)    Glucose, Bld 106 (*)    BUN 4 (*)  Total Protein 5.9 (*)    Albumin 2.7 (*)    Total Bilirubin 0.2 (*)    All other components within normal limits  URINALYSIS, ROUTINE W REFLEX MICROSCOPIC    Patient feeling much better after IV hydration and Zofran, taking po fluids and eating crackers without difficulty.  Assessment: Nausea, vomiting and diarrhea probably NORO Virus  Plan:   Home, rest, clear liquids today and then advance to B.R.A.T. Diet   Zofran for nausea   Follow up in the office   Return here as needed.     MDM

## 2011-12-13 NOTE — Discharge Instructions (Signed)
STAY ON CLEAR LIQUIDS FOR THE NEXT 12 TO 24 HOURS THEN ADVANCE TO THE B.R.A.T. DIET. BE SURE YOU ARE DRINKING PLENTY OF FLUIDS. TAKE THE MEDICATION FOR NAUSEA AS DIRECTED. FOLLOW UP WITH DR. Tamela Oddi. RETURN HERE IF SYMPTOMS WORSEN.  Norovirus Infection Norovirus illness is caused by a viral infection. The term norovirus refers to a group of viruses. Any of those viruses can cause norovirus illness. This illness is often referred to by other names such as viral gastroenteritis, stomach flu, and food poisoning. Anyone can get a norovirus infection. People can have the illness multiple times during their lifetime. CAUSES  Norovirus is found in the stool or vomit of infected people. It is easily spread from person to person (contagious). People with norovirus are contagious from the moment they begin feeling ill. They may remain contagious for as long as 3 days to 2 weeks after recovery. People can become infected with the virus in several ways. This includes:  Eating food or drinking liquids that are contaminated with norovirus.   Touching surfaces or objects contaminated with norovirus, and then placing your hand in your mouth.   Having direct contact with a person who is infected and shows symptoms. This may occur while caring for someone with illness or while sharing foods or eating utensils with someone who is ill.  SYMPTOMS  Symptoms usually begin 1 to 2 days after ingestion of the virus. Symptoms may include:  Nausea.   Vomiting.   Diarrhea.   Stomach cramps.   Low-grade fever.   Chills.   Headache.   Muscle aches.   Tiredness.  Most people with norovirus illness get better within 1 to 2 days. Some people become dehydrated because they cannot drink enough liquids to replace those lost from vomiting and diarrhea. This is especially true for young children, the elderly, and others who are unable to care for themselves. DIAGNOSIS  Diagnosis is based on your symptoms and exam.  Currently, only state public health laboratories have the ability to test for norovirus in stool or vomit. TREATMENT  No specific treatment exists for norovirus infections. No vaccine is available to prevent infections. Norovirus illness is usually brief in healthy people. If you are ill with vomiting and diarrhea, you should drink enough water and fluids to keep your urine clear or pale yellow. Dehydration is the most serious health effect that can result from this infection. By drinking oral rehydration solution (ORS), people can reduce their chance of becoming dehydrated. There are many commercially available pre-made and powdered ORS designed to safely rehydrate people. These may be recommended by your caregiver. Replace any new fluid losses from diarrhea or vomiting with ORS as follows:  If your child weighs 10 kg or less (22 lb or less), give 60 to 120 ml ( to  cup or 2 to 4 oz) of ORS for each diarrheal stool or vomiting episode.   If your child weighs more than 10 kg (more than 22 lb), give 120 to 240 ml ( to 1 cup or 4 to 8 oz) of ORS for each diarrheal stool or vomiting episode.  HOME CARE INSTRUCTIONS   Follow all your caregiver's instructions.   Avoid sugar-free and alcoholic drinks while ill.   Only take over-the-counter or prescription medicines for pain, vomiting, diarrhea, or fever as directed by your caregiver.  You can decrease your chances of coming in contact with norovirus or spreading it by following these steps:  Frequently wash your hands, especially after using the  toilet, changing diapers, and before eating or preparing food.   Carefully wash fruits and vegetables. Cook shellfish before eating them.   Do not prepare food for others while you are infected and for at least 3 days after recovering from illness.   Thoroughly clean and disinfect contaminated surfaces immediately after an episode of illness using a bleach-based household cleaner.   Immediately remove  and wash clothing or linens that may be contaminated with the virus.   Use the toilet to dispose of any vomit or stool. Make sure the surrounding area is kept clean.   Food that may have been contaminated by an ill person should be discarded.  SEEK IMMEDIATE MEDICAL CARE IF:   You develop symptoms of dehydration that do not improve with fluid replacement. This may include:   Excessive sleepiness.   Lack of tears.   Dry mouth.   Dizziness when standing.   Weak pulse.  Document Released: 11/15/2002 Document Revised: 08/14/2011 Document Reviewed: 12/17/2009 [facility] Patient Information 2012 [place], [place].Norovirus Infection Norovirus illness is caused by a viral infection. The term norovirus refers to a group of viruses. Any of those viruses can cause norovirus illness. This illness is often referred to by other names such as viral gastroenteritis, stomach flu, and food poisoning. Anyone can get a norovirus infection. People can have the illness multiple times during their lifetime. CAUSES  Norovirus is found in the stool or vomit of infected people. It is easily spread from person to person (contagious). People with norovirus are contagious from the moment they begin feeling ill. They may remain contagious for as long as 3 days to 2 weeks after recovery. People can become infected with the virus in several ways. This includes:  Eating food or drinking liquids that are contaminated with norovirus.   Touching surfaces or objects contaminated with norovirus, and then placing your hand in your mouth.   Having direct contact with a person who is infected and shows symptoms. This may occur while caring for someone with illness or while sharing foods or eating utensils with someone who is ill.  SYMPTOMS  Symptoms usually begin 1 to 2 days after ingestion of the virus. Symptoms may include:  Nausea.   Vomiting.   Diarrhea.   Stomach cramps.   Low-grade fever.   Chills.    Headache.   Muscle aches.   Tiredness.  Most people with norovirus illness get better within 1 to 2 days. Some people become dehydrated because they cannot drink enough liquids to replace those lost from vomiting and diarrhea. This is especially true for young children, the elderly, and others who are unable to care for themselves. DIAGNOSIS  Diagnosis is based on your symptoms and exam. Currently, only state public health laboratories have the ability to test for norovirus in stool or vomit. TREATMENT  No specific treatment exists for norovirus infections. No vaccine is available to prevent infections. Norovirus illness is usually brief in healthy people. If you are ill with vomiting and diarrhea, you should drink enough water and fluids to keep your urine clear or pale yellow. Dehydration is the most serious health effect that can result from this infection. By drinking oral rehydration solution (ORS), people can reduce their chance of becoming dehydrated. There are many commercially available pre-made and powdered ORS designed to safely rehydrate people. These may be recommended by your caregiver. Replace any new fluid losses from diarrhea or vomiting with ORS as follows:  If your child weighs 10 kg or less (  22 lb or less), give 60 to 120 ml ( to  cup or 2 to 4 oz) of ORS for each diarrheal stool or vomiting episode.   If your child weighs more than 10 kg (more than 22 lb), give 120 to 240 ml ( to 1 cup or 4 to 8 oz) of ORS for each diarrheal stool or vomiting episode.  HOME CARE INSTRUCTIONS   Follow all your caregiver's instructions.   Avoid sugar-free and alcoholic drinks while ill.   Only take over-the-counter or prescription medicines for pain, vomiting, diarrhea, or fever as directed by your caregiver.  You can decrease your chances of coming in contact with norovirus or spreading it by following these steps:  Frequently wash your hands, especially after using the toilet,  changing diapers, and before eating or preparing food.   Carefully wash fruits and vegetables. Cook shellfish before eating them.   Do not prepare food for others while you are infected and for at least 3 days after recovering from illness.   Thoroughly clean and disinfect contaminated surfaces immediately after an episode of illness using a bleach-based household cleaner.   Immediately remove and wash clothing or linens that may be contaminated with the virus.   Use the toilet to dispose of any vomit or stool. Make sure the surrounding area is kept clean.   Food that may have been contaminated by an ill person should be discarded.  SEEK IMMEDIATE MEDICAL CARE IF:   You develop symptoms of dehydration that do not improve with fluid replacement. This may include:   Excessive sleepiness.   Lack of tears.   Dry mouth.   Dizziness when standing.   Weak pulse.  Document Released: 11/15/2002 Document Revised: 08/14/2011 Document Reviewed: 12/17/2009 [facility] Patient Information 2012 [place], [place].

## 2012-01-25 ENCOUNTER — Encounter (HOSPITAL_COMMUNITY): Payer: Self-pay

## 2012-01-25 ENCOUNTER — Inpatient Hospital Stay (HOSPITAL_COMMUNITY): Payer: Medicaid Other

## 2012-01-25 ENCOUNTER — Inpatient Hospital Stay (HOSPITAL_COMMUNITY)
Admission: AD | Admit: 2012-01-25 | Discharge: 2012-01-26 | DRG: 781 | Disposition: A | Discharge status: Home / Self Care | Payer: Medicaid Other | Source: Ambulatory Visit | Attending: Obstetrics & Gynecology | Admitting: Obstetrics & Gynecology

## 2012-01-25 DIAGNOSIS — Y92009 Unspecified place in unspecified non-institutional (private) residence as the place of occurrence of the external cause: Secondary | ICD-10-CM

## 2012-01-25 DIAGNOSIS — O99891 Other specified diseases and conditions complicating pregnancy: Principal | ICD-10-CM | POA: Diagnosis present

## 2012-01-25 DIAGNOSIS — O4700 False labor before 37 completed weeks of gestation, unspecified trimester: Secondary | ICD-10-CM | POA: Diagnosis present

## 2012-01-25 DIAGNOSIS — W1809XA Striking against other object with subsequent fall, initial encounter: Secondary | ICD-10-CM | POA: Diagnosis present

## 2012-01-25 DIAGNOSIS — O479 False labor, unspecified: Secondary | ICD-10-CM

## 2012-01-25 DIAGNOSIS — W19XXXA Unspecified fall, initial encounter: Secondary | ICD-10-CM

## 2012-01-25 DIAGNOSIS — S93409A Sprain of unspecified ligament of unspecified ankle, initial encounter: Secondary | ICD-10-CM | POA: Diagnosis present

## 2012-01-25 HISTORY — DX: Bipolar disorder, unspecified: F31.9

## 2012-01-25 HISTORY — DX: Mental disorder, not otherwise specified: F99

## 2012-01-25 LAB — CBC
HCT: 33.2 % — ABNORMAL LOW (ref 36.0–46.0)
MCHC: 31.9 g/dL (ref 30.0–36.0)
MCV: 74.9 fL — ABNORMAL LOW (ref 78.0–100.0)
RDW: 14.4 % (ref 11.5–15.5)

## 2012-01-25 MED ORDER — LACTATED RINGERS IV BOLUS (SEPSIS)
1000.0000 mL | Freq: Once | INTRAVENOUS | Status: AC
  Administered 2012-01-25: 1000 mL via INTRAVENOUS

## 2012-01-25 MED ORDER — NIFEDIPINE 10 MG PO CAPS
10.0000 mg | ORAL_CAPSULE | ORAL | Status: DC | PRN
  Administered 2012-01-25: 10 mg via ORAL
  Filled 2012-01-25: qty 1

## 2012-01-25 MED ORDER — LACTATED RINGERS IV SOLN
INTRAVENOUS | Status: DC
  Administered 2012-01-25 – 2012-01-26 (×2): via INTRAVENOUS

## 2012-01-25 MED ORDER — OXYCODONE-ACETAMINOPHEN 5-325 MG PO TABS
2.0000 | ORAL_TABLET | Freq: Once | ORAL | Status: AC
  Administered 2012-01-25: 2 via ORAL
  Filled 2012-01-25: qty 2

## 2012-01-25 MED ORDER — ZOLPIDEM TARTRATE 10 MG PO TABS: 10.0000 mg | ORAL_TABLET | Freq: Every evening | ORAL | Status: DC | PRN

## 2012-01-25 NOTE — MAU Note (Signed)
Patient reports falling trying to get to trash can to vomit landed jamming right foot under refrigerator and landing on left side of abdomen, patient c/o right ankle swollen and generalized abdominal apin.

## 2012-01-25 NOTE — MAU Provider Note (Signed)
Chief Complaint:  Fall, Ankle Pain and Abdominal Pain    Lindsay Ramirez is  26 y.o. [id number].  Patient's last menstrual period was 04/12/2011..  [redacted]w[redacted]d  She presents complaining of Fall, Ankle Pain and Abdominal Pain . Onset is described as sudden fall approximately 30 mins ago. Fell on right abd. Pain and swelling in right ankle. Denies vaginal bleeding, LOF, back pain. States she has not felt baby move since fall.  Obstetrical/Gynecological History: OB History    Grav Para Term Preterm Abortions TAB SAB Ect Mult Living   3 2 2  0 0 0 0 0 0 2      Past Medical History: Past Medical History  Diagnosis Date  . Asthma   . Sickle cell trait   . Hepatitis B carrier   . Mental disorder   . Bipolar affective disorder     Past Surgical History: Past Surgical History  Procedure Date  . Mouth surgery     Family History: Family History  Problem Relation Age of Onset  . Hypertension Mother   . Asthma Mother   . Sickle cell trait Mother     Social History: History  Substance Use Topics  . Smoking status: Never Smoker   . Smokeless tobacco: Not on file  . Alcohol Use: No    Allergies: No Known Allergies  Prescriptions prior to admission  Medication Sig Dispense Refill  . albuterol (PROVENTIL HFA;VENTOLIN HFA) 108 (90 BASE) MCG/ACT inhaler Inhale 2 puffs into the lungs every 6 (six) hours as needed. For asthma. Pt has not needed in a long time        Review of Systems - Negative except what has been reviewed in HPI  Physical Exam   Blood pressure 110/61, pulse 81, temperature 97.9 F (36.6 C), temperature source Oral, resp. rate 16, height 5\' 1"  (1.549 m), last menstrual period 04/12/2011.  General: General appearance - alert, well appearing, and in no distress, oriented to person, place, and time and overweight Mental status - alert, oriented to person, place, and time, normal mood, behavior, speech, dress, motor activity, and thought processes, affect appropriate to  mood Abdomen - gravid, non tender, good resting tone Extremities - pedal edema 1 +, intact peripheral pulses, Right ankle swollen, no discoloration, tender to palpation, able to wiggle toes, flex and point toe, limited ROM r/t pain Focused Gynecological Exam: CERVIX: 3/60/vtx/-2/bbow  Labs: O positive/Antibody negative per prenatal record No results found for this or any previous visit (from the past 24 hour(s)). Imaging Studies:  *RADIOLOGY REPORT*  Clinical Data: History of fall complaining of ankle pain.  RIGHT ANKLE - COMPLETE 3+ VIEW  Comparison: No priors.  Findings: Three views of the right ankle demonstrate no acute  fracture, subluxation, dislocation, joint or soft tissue  abnormality.  IMPRESSION:  1. No acute radiographic abnormality of the right ankle.  Original Report Authenticated By: Florencia Reasons, M.D.   MD Consult: Discussed with Dr. Clearance Coots Admit to observation, IVF, Procardia 10mg  x 3   Assessment: [redacted]w[redacted]d s/p Fall, Contracting Fetal Testing c/w Well-Being  Plan: Admit to OBS Continuous monitoring  Mette Southgate E. 01/25/2012,4:39 PM

## 2012-01-25 NOTE — Progress Notes (Signed)
Called Dr. Clearance Coots and verified plan of care. Orders to watch FHR and UC's overnight. Pt may have Ambien 10mg  po tonight for sleep.

## 2012-01-26 ENCOUNTER — Encounter (HOSPITAL_COMMUNITY): Payer: Self-pay | Admitting: Obstetrics

## 2012-01-26 DIAGNOSIS — O4700 False labor before 37 completed weeks of gestation, unspecified trimester: Secondary | ICD-10-CM | POA: Diagnosis present

## 2012-01-26 DIAGNOSIS — S93409A Sprain of unspecified ligament of unspecified ankle, initial encounter: Secondary | ICD-10-CM | POA: Diagnosis present

## 2012-01-26 MED ORDER — OXYCODONE-ACETAMINOPHEN 5-325 MG PO TABS: 2.0000 | ORAL_TABLET | Freq: Four times a day (QID) | ORAL | Status: DC | PRN

## 2012-01-26 NOTE — Progress Notes (Signed)
Pt stating that she would like for her cervix to be checked.  Pt also stating that this was addressed with Dr. Tamela Oddi and that MD didn't feel that it was necessary.  MD called and notified that pt was still unhappy with not having her SVE.  Per MD RN may check cervix.

## 2012-01-26 NOTE — Discharge Instructions (Signed)
Ankle Sprain An ankle sprain is an injury to the strong, fibrous tissues (ligaments) that hold the bones of your ankle joint together.  CAUSES Ankle sprain usually is caused by a fall or by twisting your ankle. People who participate in sports are more prone to these types of injuries.  SYMPTOMS  Symptoms of ankle sprain include:  Pain in your ankle. The pain may be present at rest or only when you are trying to stand or walk.   Swelling.   Bruising. Bruising may develop immediately or within 1 to 2 days after your injury.   Difficulty standing or walking.  DIAGNOSIS  Your caregiver will ask you details about your injury and perform a physical exam of your ankle to determine if you have an ankle sprain. During the physical exam, your caregiver will press and squeeze specific areas of your foot and ankle. Your caregiver will try to move your ankle in certain ways. An X-ray exam may be done to be sure a bone was not broken or a ligament did not separate from one of the bones in your ankle (avulsion).  TREATMENT  Certain types of braces can help stabilize your ankle. Your caregiver can make a recommendation for this. Your caregiver may recommend the use of medication for pain. If your sprain is severe, your caregiver may refer you to a surgeon who helps to restore function to parts of your skeletal system (orthopedist) or a physical therapist. HOME CARE INSTRUCTIONS  Apply ice to your injury for 1 to 2 days or as directed by your caregiver. Applying ice helps to reduce inflammation and pain.  Put ice in a plastic bag.   Place a towel between your skin and the bag.   Leave the ice on for 15 to 20 minutes at a time, every 2 hours while you are awake.   Take over-the-counter or prescription medicines for pain, discomfort, or fever only as directed by your caregiver.   Keep your injured leg elevated, when possible, to lessen swelling.   If your caregiver recommends crutches, use them as  instructed. Gradually, put weight on the affected ankle. Continue to use crutches or a cane until you can walk without feeling pain in your ankle.   If you have a plaster splint, wear the splint as directed by your caregiver. Do not rest it on anything harder than a pillow the first 24 hours. Do not put weight on it. Do not get it wet. You may take it off to take a shower or bath.   You may have been given an elastic bandage to wear around your ankle to provide support. If the elastic bandage is too tight (you have numbness or tingling in your foot or your foot becomes cold and blue), adjust the bandage to make it comfortable.   If you have an air splint, you may blow more air into it or let air out to make it more comfortable. You may take your splint off at night and before taking a shower or bath.   Wiggle your toes in the splint several times per day if you are able.  SEEK MEDICAL CARE IF:   You have an increase in bruising, swelling, or pain.   Your toes feel cold.   Pain relief is not achieved with medication.  SEEK IMMEDIATE MEDICAL CARE IF: Your toes are numb or blue or you have severe pain. MAKE SURE YOU:   Understand these instructions.   Will watch your condition.     Will get help right away if you are not doing well or get worse.  Document Released: 08/25/2005 Document Revised: 08/14/2011 Document Reviewed: 03/29/2008 [employer] Patient Information 2012 [employer]. 

## 2012-01-26 NOTE — Discharge Summary (Signed)
  Physician Discharge Summary  Patient ID: Lindsay Ramirez MRN: [id number] DOB/AGE: [birth year] 25 y.o.  Admit date: 01/25/2012 Discharge date: 01/26/2012  Admission Diagnoses: Active Problems:  Ankle sprain  Threatened premature labor, antepartum  Discharge Diagnoses:  Active Problems:  Ankle sprain  Threatened premature labor, antepartum   Discharged Condition: stable  Hospital Course:   The patient received a dose of po Procardia for tocolysis.  The contractions abated.  The FHR was reassuring.  An X-ray of the right ankle failed to show a break/fracture.  Consults: None  Significant Diagnostic Studies: see above  Treatments: IV hydration, analgesia: Percocet   Discharge Exam: Blood pressure 108/58, pulse 74, temperature 98.4 F (36.9 C), temperature source Oral, resp. rate 18, height 5\' 1"  (1.549 m), weight 83.915 kg (185 lb), last menstrual period 04/12/2011. General appearance: alert GI: soft, non-tender; bowel sounds normal; no masses,  no organomegaly  Disposition: Home  Discharge Orders    Future Orders Please Complete By Expires   Discharge activity:      Scheduling Instructions:   Rest right ankle   Discharge diet:  No restrictions      No sexual activity restrictions      OB RESULTS CONSOLE RPR      Comments:   This external order was created through the Results Console.   OB RESULTS CONSOLE HIV antibody      Comments:   This external order was created through the Results Console.   OB RESULTS CONSOLE Hepatitis B surface antigen      Comments:   This external order was created through the Results Console.   OB RESULTS CONSOLE Rubella Antibody      Comments:   This external order was created through the Results Console.   OB RESULTS CONSOLE Varicella zoster antibody, IgG      Comments:   This external order was created through the Results Console.   OB RESULTS CONSOLE GC/Chlamydia      Comments:   This external order was created through the Results  Console.   OB RESULTS CONSOLE GC/Chlamydia      Comments:   This external order was created through the Results Console.   OB RESULTS CONSOLE ABO/Rh      Comments:   This external order was created through the Results Console.   OB RESULTS CONSOLE Antibody Screen      Comments:   This external order was created through the Results Console.     Medication List  As of 01/26/2012  8:14 AM   TAKE these medications         albuterol 108 (90 BASE) MCG/ACT inhaler   Commonly known as: PROVENTIL HFA;VENTOLIN HFA   Inhale 2 puffs into the lungs every 6 (six) hours as needed. For asthma. Pt has not needed in a long time      oxyCODONE-acetaminophen 5-325 MG per tablet   Commonly known as: PERCOCET   Take 2 tablets by mouth every 6 (six) hours as needed for pain.           Follow-up Information    Please follow up. (Keep previous appointment)          Signed: Antionette Char A 01/26/2012, 8:14 AM

## 2012-01-26 NOTE — H&P (Signed)
Lindsay Ramirez is a 26 y.o. female presenting for sprained right ankle and UC's. Maternal Medical History:  Reason for admission: Reason for admission: contractions.  Contractions: Onset was 3-5 hours ago.   Frequency: regular.   Perceived severity is moderate.    Fetal activity: Perceived fetal activity is normal.   Last perceived fetal movement was within the past hour.    Prenatal complications: no prenatal complications   OB History    Grav Para Term Preterm Abortions TAB SAB Ect Mult Living   3 2 2  0 0 0 0 0 0 2     Past Medical History  Diagnosis Date  . Asthma   . Sickle cell trait   . Hepatitis B carrier   . Mental disorder   . Bipolar affective disorder    Past Surgical History  Procedure Date  . Mouth surgery    Family History: family history includes Asthma in her mother; Hypertension in her mother; and Sickle cell trait in her mother. Social History:  reports that she has never smoked. She has never used smokeless tobacco. She reports that she does not drink alcohol or use illicit drugs.  Review of Systems  All other systems reviewed and are negative.    Dilation: 3 Effacement (%): 60 Station: -2 Exam by:: S.Shores,CNM Blood pressure 108/58, pulse 74, temperature 98.4 F (36.9 C), temperature source Oral, resp. rate 18, height 5\' 1"  (1.549 m), weight 83.915 kg (185 lb), last menstrual period 04/12/2011. Maternal Exam:  Uterine Assessment: Contraction strength is moderate.  Contraction frequency is regular.   Abdomen: Patient reports no abdominal tenderness. Fetal presentation: vertex     Physical Exam  Nursing note and vitals reviewed. Constitutional: She is oriented to person, place, and time. She appears well-developed and well-nourished.  HENT:  Head: Normocephalic and atraumatic.  Eyes: Conjunctivae are normal. Pupils are equal, round, and reactive to light.  Neck: Normal range of motion. Neck supple.  Cardiovascular: Normal rate and  regular rhythm.   Respiratory: Effort normal.  GI: Soft.  Musculoskeletal: Normal range of motion. She exhibits tenderness.       Right ankle slightly swollen and tender.  Neurological: She is alert and oriented to person, place, and time.  Skin: Skin is warm and dry.  Psychiatric: She has a normal mood and affect. Her behavior is normal. Judgment and thought content normal.    Prenatal labs: ABO, Rh: O/Positive/-- (11/27 0000) Antibody: Negative (11/27 0000) Rubella: Immune (11/27 0000) RPR: NON REACTIVE (05/19 1650)  HBsAg: Negative (11/27 0000)  HIV: Non-reactive (11/27 0000)  GBS:     Assessment/Plan: 36.2 weeks.  Right ankle sprain after fall.  Threatened PTL.  Admitted for observation.   Cristiano Capri A 01/26/2012, 8:25 AM

## 2012-01-29 ENCOUNTER — Inpatient Hospital Stay (HOSPITAL_COMMUNITY)
Admission: AD | Admit: 2012-01-29 | Discharge: 2012-01-29 | Disposition: A | Discharge status: Home / Self Care | Payer: Medicaid Other | Source: Ambulatory Visit | Attending: Obstetrics | Admitting: Obstetrics

## 2012-01-29 ENCOUNTER — Encounter (HOSPITAL_COMMUNITY): Payer: Self-pay | Admitting: *Deleted

## 2012-01-29 DIAGNOSIS — O99891 Other specified diseases and conditions complicating pregnancy: Secondary | ICD-10-CM | POA: Insufficient documentation

## 2012-01-29 DIAGNOSIS — S93409A Sprain of unspecified ligament of unspecified ankle, initial encounter: Secondary | ICD-10-CM

## 2012-01-29 DIAGNOSIS — O4700 False labor before 37 completed weeks of gestation, unspecified trimester: Secondary | ICD-10-CM

## 2012-01-29 DIAGNOSIS — O479 False labor, unspecified: Secondary | ICD-10-CM | POA: Insufficient documentation

## 2012-01-29 DIAGNOSIS — K089 Disorder of teeth and supporting structures, unspecified: Secondary | ICD-10-CM | POA: Insufficient documentation

## 2012-01-29 NOTE — Discharge Instructions (Signed)
Normal Labor and Delivery Your caregiver must first be sure you are in labor. Signs of labor include:  You may pass what is called "the mucus plug" before labor begins. This is a small amount of blood stained mucus.   Regular uterine contractions.   The time between contractions get closer together.   The discomfort and pain gradually gets more intense.   Pains are mostly located in the back.   Pains get worse when walking.   The cervix (the opening of the uterus becomes thinner (begins to efface) and opens up (dilates).  Once you are in labor and admitted into the hospital or care center, your caregiver will do the following:  A complete physical examination.   Check your vital signs (blood pressure, pulse, temperature and the fetal heart rate).   Do a vaginal examination (using a sterile glove and lubricant) to determine:   The position (presentation) of the baby (head [vertex] or buttock first).   The level (station) of the baby's head in the birth canal.   The effacement and dilatation of the cervix.   You may have your pubic hair shaved and be given an enema depending on your caregiver and the circumstance.   An electronic monitor is usually placed on your abdomen. The monitor follows the length and intensity of the contractions, as well as the baby's heart rate.   Usually, your caregiver will insert an IV in your arm with a bottle of sugar water. This is done as a precaution so that medications can be given to you quickly during labor or delivery.  NORMAL LABOR AND DELIVERY IS DIVIDED UP INTO 3 STAGES: First Stage This is when regular contractions begin and the cervix begins to efface and dilate. This stage can last from 3 to 15 hours. The end of the first stage is when the cervix is 100% effaced and 10 centimeters dilated. Pain medications may be given by   Injection (morphine, demerol, etc.)   Regional anesthesia (spinal, caudal or epidural, anesthetics given in  different locations of the spine). Paracervical pain medication may be given, which is an injection of and anesthetic on each side of the cervix.  A pregnant woman may request to have "Natural Childbirth" which is not to have any medications or anesthesia during her labor and delivery. Second Stage This is when the baby comes down through the birth canal (vagina) and is born. This can take 1 to 4 hours. As the baby's head comes down through the birth canal, you may feel like you are going to have a bowel movement. You will get the urge to bear down and push until the baby is delivered. As the baby's head is being delivered, the caregiver will decide if an episiotomy (a cut in the perineum and vagina area) is needed to prevent tearing of the tissue in this area. The episiotomy is sewn up after the delivery of the baby and placenta. Sometimes a mask with nitrous oxide is given for the mother to breath during the delivery of the baby to help if there is too much pain. The end of Stage 2 is when the baby is fully delivered. Then when the umbilical cord stops pulsating it is clamped and cut. Third Stage The third stage begins after the baby is completely delivered and ends after the placenta (afterbirth) is delivered. This usually takes 5 to 30 minutes. After the placenta is delivered, a medication is given either by intravenous or injection to help contract   the uterus and prevent bleeding. The third stage is not painful and pain medication is usually not necessary. If an episiotomy was done, it is repaired at this time. After the delivery, the mother is watched and monitored closely for 1 to 2 hours to make sure there is no postpartum bleeding (hemorrhage). If there is a lot of bleeding, medication is given to contract the uterus and stop the bleeding. Document Released: 06/03/2008 Document Revised: 08/14/2011 Document Reviewed: 06/03/2008 [employer] Patient Information 2012 [employer]. 

## 2012-01-29 NOTE — MAU Note (Signed)
SAYS UC HURT BAD - SINCE Sunday- SHE FELL-  STAYED OVERNIGHT - WENT HOME Monday. THEN UC  LESS. THEN  ON WED UC  HURT AGAIN.   VE  HERE- 3 CM.   ALSO HAS TOOTHACHE IN R UPPER  TOOTH.- WENT TO DENTIST TODAY- GAVE RX- AMOX.- NOT FILLED YET.  BACK PAIN STARTED  ON Sunday- AND HAS CONTINUED- RADIATES  DOWN HIP ON L SIDE.

## 2012-02-07 ENCOUNTER — Inpatient Hospital Stay (HOSPITAL_COMMUNITY): Payer: Medicaid Other

## 2012-02-07 ENCOUNTER — Encounter (HOSPITAL_COMMUNITY): Payer: Self-pay | Admitting: Obstetrics and Gynecology

## 2012-02-07 ENCOUNTER — Inpatient Hospital Stay (HOSPITAL_COMMUNITY)
Admission: AD | Admit: 2012-02-07 | Discharge: 2012-02-07 | Disposition: A | Discharge status: Home / Self Care | Payer: Medicaid Other | Source: Ambulatory Visit | Attending: Obstetrics | Admitting: Obstetrics

## 2012-02-07 DIAGNOSIS — O99891 Other specified diseases and conditions complicating pregnancy: Secondary | ICD-10-CM | POA: Insufficient documentation

## 2012-02-07 DIAGNOSIS — O4700 False labor before 37 completed weeks of gestation, unspecified trimester: Secondary | ICD-10-CM

## 2012-02-07 DIAGNOSIS — IMO0002 Reserved for concepts with insufficient information to code with codable children: Secondary | ICD-10-CM | POA: Insufficient documentation

## 2012-02-07 DIAGNOSIS — R109 Unspecified abdominal pain: Secondary | ICD-10-CM | POA: Insufficient documentation

## 2012-02-07 DIAGNOSIS — O9A213 Injury, poisoning and certain other consequences of external causes complicating pregnancy, third trimester: Secondary | ICD-10-CM

## 2012-02-07 DIAGNOSIS — S301XXA Contusion of abdominal wall, initial encounter: Secondary | ICD-10-CM

## 2012-02-07 LAB — URINALYSIS, ROUTINE W REFLEX MICROSCOPIC
Hgb urine dipstick: NEGATIVE
Nitrite: NEGATIVE
Protein, ur: NEGATIVE mg/dL
Specific Gravity, Urine: 1.01 (ref 1.005–1.030)
Urobilinogen, UA: 0.2 mg/dL (ref 0.0–1.0)

## 2012-02-07 LAB — URINE MICROSCOPIC-ADD ON

## 2012-02-07 MED ORDER — CYCLOBENZAPRINE HCL 10 MG PO TABS: 10.0000 mg | ORAL_TABLET | Freq: Two times a day (BID) | ORAL | Status: DC | PRN

## 2012-02-07 MED ORDER — OXYCODONE-ACETAMINOPHEN 5-325 MG PO TABS
2.0000 | ORAL_TABLET | Freq: Once | ORAL | Status: AC
  Administered 2012-02-07: 2 via ORAL
  Filled 2012-02-07: qty 2

## 2012-02-07 MED ORDER — CYCLOBENZAPRINE HCL 10 MG PO TABS
10.0000 mg | ORAL_TABLET | Freq: Once | ORAL | Status: AC
  Administered 2012-02-07: 10 mg via ORAL
  Filled 2012-02-07: qty 1

## 2012-02-07 NOTE — Progress Notes (Signed)
Dr Clearance Coots notified of AFI results, 4 hour monitoring complete, cervical exam 3, 70,-3, patient says she was 3-4 cm in the office this week. Pts pain not any better, I requested to trial a flexeril for pain, orders received and requested NP to write prescription for patient to go home with flexeril.
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
## 2012-02-08 LAB — URINE CULTURE
Colony Count: 55000
Special Requests: NORMAL

## 2012-02-14 ENCOUNTER — Encounter (HOSPITAL_COMMUNITY): Payer: Self-pay | Admitting: Obstetrics and Gynecology

## 2012-02-14 ENCOUNTER — Inpatient Hospital Stay (HOSPITAL_COMMUNITY): Payer: Medicaid Other | Admitting: Anesthesiology

## 2012-02-14 ENCOUNTER — Encounter (HOSPITAL_COMMUNITY): Payer: Self-pay | Admitting: Anesthesiology

## 2012-02-14 ENCOUNTER — Inpatient Hospital Stay (HOSPITAL_COMMUNITY)
Admission: AD | Admit: 2012-02-14 | Discharge: 2012-02-16 | DRG: 775 | Disposition: A | Discharge status: Home / Self Care | Payer: Medicaid Other | Source: Ambulatory Visit | Attending: Obstetrics & Gynecology | Admitting: Obstetrics & Gynecology

## 2012-02-14 ENCOUNTER — Encounter (HOSPITAL_COMMUNITY): Payer: Self-pay | Admitting: Family Medicine

## 2012-02-14 DIAGNOSIS — O4700 False labor before 37 completed weeks of gestation, unspecified trimester: Secondary | ICD-10-CM

## 2012-02-14 DIAGNOSIS — S93409A Sprain of unspecified ligament of unspecified ankle, initial encounter: Secondary | ICD-10-CM

## 2012-02-14 LAB — CBC
Hemoglobin: 9.9 g/dL — ABNORMAL LOW (ref 12.0–15.0)
MCV: 73.1 fL — ABNORMAL LOW (ref 78.0–100.0)
Platelets: 214 10*3/uL (ref 150–400)
RBC: 4.35 MIL/uL (ref 3.87–5.11)
WBC: 15.6 10*3/uL — ABNORMAL HIGH (ref 4.0–10.5)

## 2012-02-14 LAB — OB RESULTS CONSOLE GBS: GBS: NEGATIVE

## 2012-02-14 MED ORDER — WITCH HAZEL-GLYCERIN EX PADS: 1.0000 "application " | MEDICATED_PAD | CUTANEOUS | Status: DC | PRN

## 2012-02-14 MED ORDER — PHENYLEPHRINE 40 MCG/ML (10ML) SYRINGE FOR IV PUSH (FOR BLOOD PRESSURE SUPPORT)
80.0000 ug | PREFILLED_SYRINGE | INTRAVENOUS | Status: DC | PRN
  Filled 2012-02-14: qty 5

## 2012-02-14 MED ORDER — IBUPROFEN 600 MG PO TABS
600.0000 mg | ORAL_TABLET | Freq: Four times a day (QID) | ORAL | Status: DC | PRN
  Filled 2012-02-14 (×6): qty 1

## 2012-02-14 MED ORDER — TETANUS-DIPHTH-ACELL PERTUSSIS 5-2.5-18.5 LF-MCG/0.5 IM SUSP
0.5000 mL | Freq: Once | INTRAMUSCULAR | Status: DC
  Filled 2012-02-14: qty 0.5

## 2012-02-14 MED ORDER — BENZOCAINE-MENTHOL 20-0.5 % EX AERO
1.0000 "application " | INHALATION_SPRAY | CUTANEOUS | Status: DC | PRN
  Administered 2012-02-14: 1 via TOPICAL
  Filled 2012-02-14: qty 56

## 2012-02-14 MED ORDER — DIBUCAINE 1 % RE OINT: 1.0000 "application " | TOPICAL_OINTMENT | RECTAL | Status: DC | PRN

## 2012-02-14 MED ORDER — LACTATED RINGERS IV SOLN
INTRAVENOUS | Status: DC
  Administered 2012-02-14: 999 mL/h via INTRAVENOUS
  Administered 2012-02-14: 18:00:00 via INTRAVENOUS

## 2012-02-14 MED ORDER — LACTATED RINGERS IV SOLN: 500.0000 mL | INTRAVENOUS | Status: DC | PRN

## 2012-02-14 MED ORDER — LIDOCAINE HCL (PF) 1 % IJ SOLN
INTRAMUSCULAR | Status: DC | PRN
  Administered 2012-02-14 (×2): 8 mL

## 2012-02-14 MED ORDER — OXYCODONE-ACETAMINOPHEN 5-325 MG PO TABS
1.0000 | ORAL_TABLET | ORAL | Status: DC | PRN
  Administered 2012-02-15 – 2012-02-16 (×4): 1 via ORAL
  Filled 2012-02-14 (×5): qty 1

## 2012-02-14 MED ORDER — FENTANYL 2.5 MCG/ML BUPIVACAINE 1/10 % EPIDURAL INFUSION (WH - ANES)
14.0000 mL/h | INTRAMUSCULAR | Status: DC
  Filled 2012-02-14: qty 60

## 2012-02-14 MED ORDER — LIDOCAINE HCL (PF) 1 % IJ SOLN
30.0000 mL | INTRAMUSCULAR | Status: DC | PRN
  Filled 2012-02-14: qty 30

## 2012-02-14 MED ORDER — LANOLIN HYDROUS EX OINT: TOPICAL_OINTMENT | CUTANEOUS | Status: DC | PRN

## 2012-02-14 MED ORDER — FERROUS SULFATE 325 (65 FE) MG PO TABS
325.0000 mg | ORAL_TABLET | Freq: Two times a day (BID) | ORAL | Status: DC
  Administered 2012-02-15 (×2): 325 mg via ORAL
  Filled 2012-02-14 (×2): qty 1

## 2012-02-14 MED ORDER — ONDANSETRON HCL 4 MG PO TABS: 4.0000 mg | ORAL_TABLET | ORAL | Status: DC | PRN

## 2012-02-14 MED ORDER — EPHEDRINE 5 MG/ML INJ: 10.0000 mg | INTRAVENOUS | Status: DC | PRN

## 2012-02-14 MED ORDER — DIPHENHYDRAMINE HCL 50 MG/ML IJ SOLN: 12.5000 mg | INTRAMUSCULAR | Status: DC | PRN

## 2012-02-14 MED ORDER — SIMETHICONE 80 MG PO CHEW: 80.0000 mg | CHEWABLE_TABLET | ORAL | Status: DC | PRN

## 2012-02-14 MED ORDER — EPHEDRINE 5 MG/ML INJ
10.0000 mg | INTRAVENOUS | Status: DC | PRN
  Filled 2012-02-14: qty 4

## 2012-02-14 MED ORDER — OXYTOCIN BOLUS FROM INFUSION
500.0000 mL | Freq: Once | INTRAVENOUS | Status: DC
  Filled 2012-02-14: qty 1000
  Filled 2012-02-14: qty 500

## 2012-02-14 MED ORDER — ONDANSETRON HCL 4 MG/2ML IJ SOLN: 4.0000 mg | INTRAMUSCULAR | Status: DC | PRN

## 2012-02-14 MED ORDER — PHENYLEPHRINE 40 MCG/ML (10ML) SYRINGE FOR IV PUSH (FOR BLOOD PRESSURE SUPPORT): 80.0000 ug | PREFILLED_SYRINGE | INTRAVENOUS | Status: DC | PRN

## 2012-02-14 MED ORDER — SENNOSIDES-DOCUSATE SODIUM 8.6-50 MG PO TABS
2.0000 | ORAL_TABLET | Freq: Every day | ORAL | Status: DC
  Administered 2012-02-14: 2 via ORAL

## 2012-02-14 MED ORDER — FENTANYL 2.5 MCG/ML BUPIVACAINE 1/10 % EPIDURAL INFUSION (WH - ANES)
INTRAMUSCULAR | Status: DC | PRN
  Administered 2012-02-14: 13 mL/h via EPIDURAL

## 2012-02-14 MED ORDER — PRENATAL MULTIVITAMIN CH
1.0000 | ORAL_TABLET | Freq: Every day | ORAL | Status: DC
  Administered 2012-02-15: 1 via ORAL
  Filled 2012-02-14: qty 1

## 2012-02-14 MED ORDER — CITRIC ACID-SODIUM CITRATE 334-500 MG/5ML PO SOLN: 30.0000 mL | ORAL | Status: DC | PRN

## 2012-02-14 MED ORDER — LACTATED RINGERS IV SOLN: 500.0000 mL | Freq: Once | INTRAVENOUS | Status: DC

## 2012-02-14 MED ORDER — ACETAMINOPHEN 325 MG PO TABS: 650.0000 mg | ORAL_TABLET | ORAL | Status: DC | PRN

## 2012-02-14 MED ORDER — ONDANSETRON HCL 4 MG/2ML IJ SOLN: 4.0000 mg | Freq: Four times a day (QID) | INTRAMUSCULAR | Status: DC | PRN

## 2012-02-14 MED ORDER — IBUPROFEN 600 MG PO TABS
600.0000 mg | ORAL_TABLET | Freq: Four times a day (QID) | ORAL | Status: DC
  Administered 2012-02-14 – 2012-02-16 (×6): 600 mg via ORAL
  Filled 2012-02-14: qty 1

## 2012-02-14 MED ORDER — DIPHENHYDRAMINE HCL 25 MG PO CAPS: 25.0000 mg | ORAL_CAPSULE | Freq: Four times a day (QID) | ORAL | Status: DC | PRN

## 2012-02-14 MED ORDER — BUTORPHANOL TARTRATE 2 MG/ML IJ SOLN
1.0000 mg | INTRAMUSCULAR | Status: DC | PRN
  Filled 2012-02-14: qty 1

## 2012-02-14 MED ORDER — BUTORPHANOL TARTRATE 2 MG/ML IJ SOLN
2.0000 mg | Freq: Once | INTRAMUSCULAR | Status: AC
  Administered 2012-02-14: 2 mg via INTRAVENOUS

## 2012-02-14 MED ORDER — FLEET ENEMA 7-19 GM/118ML RE ENEM: 1.0000 | ENEMA | RECTAL | Status: DC | PRN

## 2012-02-14 MED ORDER — OXYTOCIN 20 UNITS IN LACTATED RINGERS INFUSION - SIMPLE: 125.0000 mL/h | Freq: Once | INTRAVENOUS | Status: DC

## 2012-02-14 MED ORDER — OXYCODONE-ACETAMINOPHEN 5-325 MG PO TABS: 1.0000 | ORAL_TABLET | ORAL | Status: DC | PRN

## 2012-02-14 MED ORDER — ZOLPIDEM TARTRATE 5 MG PO TABS: 5.0000 mg | ORAL_TABLET | Freq: Every evening | ORAL | Status: DC | PRN

## 2012-02-14 NOTE — Progress Notes (Signed)
NSVD of a viable female.  

## 2012-02-14 NOTE — H&P (Signed)
This is Dr. Francoise Ceo dictating the history and physical on Lindsay Ramirez she's a 26 year old gravida 3 para 202 at 72 weeks EDC 02/21/2012 she is a negative GBS and she was admitted in labor she is now 8 cm 100% and vertex at S1 station amniotomy was performed and the fluids clear Past medical history negative Past surgical history negative Social history negative System review negative Physical exam revealed a well-developed female in labor HTA HEENT negative Breasts negative Heart regular rhythm no murmurs no gallops Lungs clear Abdomen term Pelvic as described above Extremities negative

## 2012-02-14 NOTE — Progress Notes (Signed)
Lindsay Ramirez is a 26 y.o. G3P2002 at [redacted]w[redacted]d by ultrasound admitted for active labor  Subjective:   Objective: BP 107/47  Pulse 67  Temp(Src) 98.5 F (36.9 C) (Oral)  Resp 18  Ht 5\' 2"  (1.575 m)  Wt 88.451 kg (195 lb)  BMI 35.67 kg/m2  SpO2 100%  LMP 04/12/2011      FHT:  FHR: 21617 bpm, variability: moderate,  accelerations:  Present,  decelerations:  Absent UC:   none SVE:   Dilation: Lip/rim Effacement (%): 100 Station: +2 Exam by:: Everrett Coombe RN  Labs: Lab Results  Component Value Date   WBC 15.6* 02/14/2012   HGB 9.9* 02/14/2012   HCT 31.8* 02/14/2012   MCV 73.1* 02/14/2012   PLT 214 02/14/2012    Assessment / Plan: Spontaneous labor, progressing normally  Labor: Progressing normally Preeclampsia:  no signs or symptoms of toxicity Fetal Wellbeing:  Category I Pain Control:  Labor support without medications I/D:  n/a Anticipated MOD:  NSVD  Edmon Magid A 02/14/2012, 8:06 PM

## 2012-02-14 NOTE — MAU Note (Signed)
"  I started having UC's around 1200 this afternoon.  I don't know how close they are.  I was dilated 3 or 4 cm this week at my doctor's appointment.  No bleeding and I don't know about leaking fluid. (+) FM."

## 2012-02-14 NOTE — Anesthesia Preprocedure Evaluation (Signed)
Anesthesia Evaluation  Patient identified by MRN, date of birth, ID band Patient awake    Reviewed: Allergy & Precautions, H&P , NPO status , Patient's Chart, lab work & pertinent test results  Airway Mallampati: II TM Distance: >3 FB Neck ROM: full    Dental No notable dental hx.    Pulmonary    Pulmonary exam normal       Cardiovascular negative cardio ROS      Neuro/Psych negative neurological ROS     GI/Hepatic negative GI ROS, Neg liver ROS,   Endo/Other  Morbid obesity  Renal/GU negative Renal ROS  negative genitourinary   Musculoskeletal negative musculoskeletal ROS (+)   Abdominal (+) + obese,   Peds negative pediatric ROS (+)  Hematology negative hematology ROS (+)   Anesthesia Other Findings   Reproductive/Obstetrics (+) Pregnancy                           Anesthesia Physical Anesthesia Plan  ASA: III  Anesthesia Plan: Epidural   Post-op Pain Management:    Induction:   Airway Management Planned:   Additional Equipment:   Intra-op Plan:   Post-operative Plan:   Informed Consent: I have reviewed the patients History and Physical, chart, labs and discussed the procedure including the risks, benefits and alternatives for the proposed anesthesia with the patient or authorized representative who has indicated his/her understanding and acceptance.     Plan Discussed with:   Anesthesia Plan Comments:         Anesthesia Quick Evaluation

## 2012-02-14 NOTE — Anesthesia Procedure Notes (Signed)
Epidural Patient location during procedure: OB Start time: 02/14/2012 5:40 PM End time: 02/14/2012 5:44 PM Reason for block: procedure for pain  Staffing Anesthesiologist: Sandrea Hughs Performed by: anesthesiologist   Preanesthetic Checklist Completed: patient identified, site marked, surgical consent, pre-op evaluation, timeout performed, IV checked, risks and benefits discussed and monitors and equipment checked  Epidural Patient position: sitting Prep: site prepped and draped and DuraPrep Patient monitoring: continuous pulse ox and blood pressure Approach: midline Injection technique: LOR air  Needle:  Needle type: Tuohy  Needle gauge: 17 G Needle length: 9 cm Needle insertion depth: 6 cm Catheter type: closed end flexible Catheter size: 19 Gauge Catheter at skin depth: 11 cm Test dose: negative and Other  Assessment Sensory level: T8 Events: blood not aspirated, injection not painful, no injection resistance, negative IV test and no paresthesia

## 2012-02-15 ENCOUNTER — Encounter (HOSPITAL_COMMUNITY): Payer: Self-pay | Admitting: *Deleted

## 2012-02-15 LAB — CBC
Platelets: 213 10*3/uL (ref 150–400)
RDW: 15.4 % (ref 11.5–15.5)
WBC: 21.6 10*3/uL — ABNORMAL HIGH (ref 4.0–10.5)

## 2012-02-15 NOTE — Anesthesia Postprocedure Evaluation (Signed)
  Anesthesia Post-op Note  Patient: Lindsay Ramirez  Procedure(s) Performed: * No procedures listed *  Patient Location: Mother/Baby  Anesthesia Type: Epidural  Level of Consciousness: awake, alert  and oriented  Airway and Oxygen Therapy: Patient Spontanous Breathing  Post-op Pain: mild  Post-op Assessment: Patient's Cardiovascular Status Stable and Respiratory Function Stable  Post-op Vital Signs: stable  Complications: No apparent anesthesia complications

## 2012-02-16 MED ORDER — SENNOSIDES-DOCUSATE SODIUM 8.6-50 MG PO TABS: 2.0000 | ORAL_TABLET | Freq: Every day | ORAL | Status: DC

## 2012-02-16 MED ORDER — OXYCODONE-ACETAMINOPHEN 5-325 MG PO TABS: 1.0000 | ORAL_TABLET | ORAL | Status: AC | PRN

## 2012-02-16 MED ORDER — ONDANSETRON HCL 4 MG/2ML IJ SOLN: 4.0000 mg | Freq: Once | INTRAMUSCULAR | Status: DC

## 2012-02-16 MED ORDER — IBUPROFEN 600 MG PO TABS: 600.0000 mg | ORAL_TABLET | Freq: Four times a day (QID) | ORAL | Status: DC

## 2012-02-16 MED ORDER — ZOLPIDEM TARTRATE 5 MG PO TABS: 5.0000 mg | ORAL_TABLET | Freq: Every evening | ORAL | Status: DC | PRN

## 2012-02-16 MED ORDER — BENZOCAINE-MENTHOL 20-0.5 % EX AERO: 1.0000 "application " | INHALATION_SPRAY | CUTANEOUS | Status: DC | PRN

## 2012-02-16 MED ORDER — PRENATAL MULTIVITAMIN CH
1.0000 | ORAL_TABLET | Freq: Every day | ORAL | Status: DC
  Administered 2012-02-16: 1 via ORAL
  Filled 2012-02-16: qty 1

## 2012-02-16 MED ORDER — ONDANSETRON HCL 4 MG PO TABS: 4.0000 mg | ORAL_TABLET | ORAL | Status: DC | PRN

## 2012-02-16 MED ORDER — OXYCODONE-ACETAMINOPHEN 5-325 MG PO TABS: 1.0000 | ORAL_TABLET | ORAL | Status: DC | PRN

## 2012-02-16 MED ORDER — TETANUS-DIPHTH-ACELL PERTUSSIS 5-2.5-18.5 LF-MCG/0.5 IM SUSP: 0.5000 mL | Freq: Once | INTRAMUSCULAR | Status: DC

## 2012-02-16 MED ORDER — DIPHENHYDRAMINE HCL 25 MG PO CAPS: 25.0000 mg | ORAL_CAPSULE | Freq: Four times a day (QID) | ORAL | Status: DC | PRN

## 2012-02-16 MED ORDER — ONDANSETRON HCL 4 MG/2ML IJ SOLN: 4.0000 mg | INTRAMUSCULAR | Status: DC | PRN

## 2012-02-16 MED ORDER — DIBUCAINE 1 % RE OINT: 1.0000 "application " | TOPICAL_OINTMENT | RECTAL | Status: DC | PRN

## 2012-02-16 MED ORDER — FERROUS SULFATE 325 (65 FE) MG PO TABS
325.0000 mg | ORAL_TABLET | Freq: Two times a day (BID) | ORAL | Status: DC
  Administered 2012-02-16: 325 mg via ORAL
  Filled 2012-02-16: qty 1

## 2012-02-16 MED ORDER — LANOLIN HYDROUS EX OINT: TOPICAL_OINTMENT | CUTANEOUS | Status: DC | PRN

## 2012-02-16 MED ORDER — SIMETHICONE 80 MG PO CHEW: 80.0000 mg | CHEWABLE_TABLET | ORAL | Status: DC | PRN

## 2012-02-16 MED ORDER — WITCH HAZEL-GLYCERIN EX PADS: 1.0000 "application " | MEDICATED_PAD | CUTANEOUS | Status: DC | PRN

## 2012-02-16 NOTE — Discharge Summary (Signed)
Obstetric Discharge Summary Reason for Admission: onset of labor Prenatal Procedures: ultrasound Intrapartum Procedures: spontaneous vaginal delivery Postpartum Procedures: none Complications-Operative and Postpartum: none Hemoglobin  Date Value Range Status  02/15/2012 9.2* 12.0-15.0 (g/dL) Final     HCT  Date Value Range Status  02/15/2012 29.8* 36.0-46.0 (%) Final    Physical Exam:  General: alert and no distress Lochia: appropriate Uterine Fundus: firm Incision: healing well DVT Evaluation: No evidence of DVT seen on physical exam.  Discharge Diagnoses: Term Pregnancy-delivered  Discharge Information: Date: 02/16/2012 Activity: pelvic rest Diet: routine Medications: PNV, Ibuprofen, Colace and Percocet Condition: stable Instructions: refer to practice specific booklet Discharge to: home Follow-up Information    Follow up with Theran Vandergrift A, MD. Schedule an appointment as soon as possible for a visit in 6 weeks.   Contact information:   [address] [place] [address] [phone]          Newborn Data: Live born female  Birth Weight: 6 lb 14.1 oz (3120 g) APGAR: 8, 9  Home with mother.  Shenna Brissette A 02/16/2012, 8:48 AM

## 2012-02-16 NOTE — Progress Notes (Signed)
UR chart review completed.  

## 2012-02-16 NOTE — Progress Notes (Signed)
Post Partum Day 2 Subjective: no complaints, up ad lib, voiding, tolerating PO and ready for discharge.  Objective: Blood pressure 121/69, pulse 59, temperature 97.5 F (36.4 C), temperature source Oral, resp. rate 18, height 5\' 2"  (1.575 m), weight 88.451 kg (195 lb), last menstrual period 04/12/2011, SpO2 100.00%, unknown if currently breastfeeding.  Physical Exam:  General: alert and no distress Lochia: appropriate Uterine Fundus: firm Incision: healing well DVT Evaluation: No evidence of DVT seen on physical exam.   Basename 02/15/12 0614 02/14/12 1655  HGB 9.2* 9.9*  HCT 29.8* 31.8*    Assessment/Plan: Discharge home   LOS: 2 days   Aryanah Enslow A 02/16/2012, 8:43 AM

## 2012-05-14 ENCOUNTER — Inpatient Hospital Stay (HOSPITAL_COMMUNITY)
Admission: AD | Admit: 2012-05-14 | Discharge: 2012-05-14 | Disposition: A | Discharge status: Home / Self Care | Payer: Medicaid Other | Source: Ambulatory Visit | Attending: Obstetrics & Gynecology | Admitting: Obstetrics & Gynecology

## 2012-05-14 ENCOUNTER — Encounter (HOSPITAL_COMMUNITY): Payer: Self-pay | Admitting: *Deleted

## 2012-05-14 DIAGNOSIS — J069 Acute upper respiratory infection, unspecified: Secondary | ICD-10-CM | POA: Insufficient documentation

## 2012-05-14 DIAGNOSIS — K5900 Constipation, unspecified: Secondary | ICD-10-CM | POA: Insufficient documentation

## 2012-05-14 DIAGNOSIS — Z331 Pregnant state, incidental: Secondary | ICD-10-CM

## 2012-05-14 DIAGNOSIS — Z3201 Encounter for pregnancy test, result positive: Secondary | ICD-10-CM | POA: Insufficient documentation

## 2012-05-14 LAB — POCT PREGNANCY, URINE: Preg Test, Ur: POSITIVE — AB

## 2012-05-14 MED ORDER — PRENATAL PLUS 27-1 MG PO TABS: 1.0000 | ORAL_TABLET | Freq: Every day | ORAL | Status: DC

## 2012-05-14 MED ORDER — DOCUSATE SODIUM 100 MG PO CAPS
100.0000 mg | ORAL_CAPSULE | Freq: Two times a day (BID) | ORAL | Status: AC | PRN
Start: 2012-05-14 — End: 2012-05-24

## 2012-05-14 NOTE — MAU Provider Note (Signed)
Chief Complaint: URI   Initiated contact at 1820   SUBJECTIVE HPI: Lindsay Ramirez is a 26 y.o. [id number] amenorrheic since delivered June 7, had a pos HPT at the end of Aug and a neg HPT a few days before that, ie approximately 5-6 wks now. She presents to MAU reporting UR congestion and mild sore throat with post nasal drip. No productive cough.  Denies seasonal allergies. No CP, SOB, wheezing. Also has chronic constipation and stools are hard.  Bottlefeeding and no contraception. No abd pain or vaginal bleeding.   Past Medical History  Diagnosis Date  . Asthma   . Sickle cell trait   . Mental disorder   . Bipolar affective disorder    OB History    Grav Para Term Preterm Abortions TAB SAB Ect Mult Living   3 3 3  0 0 0 0 0 0 3     # Outc Date GA Lbr Len/2nd Wgt Sex Del Anes PTL Lv   1 TRM 6/13 [redacted]w[redacted]d 07:40 / 00:18 6lb14.1oz(3.12kg) M SVD EPI  Yes   Comments: WNL   2 TRM            3 TRM              Past Surgical History  Procedure Date  . Mouth surgery   . No past surgeries    History   Social History  . Marital Status: Single    Spouse Name: N/A    Number of Children: N/A  . Years of Education: N/A   Occupational History  . Not on file.   Social History Main Topics  . Smoking status: Never Smoker   . Smokeless tobacco: Never Used  . Alcohol Use: No  . Drug Use: No  . Sexually Active: Yes    Birth Control/ Protection: None   Other Topics Concern  . Not on file   Social History Narrative  . No narrative on file   No current facility-administered medications on file prior to encounter.   Current Outpatient Prescriptions on File Prior to Encounter  Medication Sig Dispense Refill  . ibuprofen (ADVIL,MOTRIN) 600 MG tablet Take 1 tablet (600 mg total) by mouth every 6 (six) hours.  30 tablet  5   No Known Allergies  ROS: Pertinent items in HPI  OBJECTIVE Blood pressure 130/78, pulse 68, temperature 98.8 F (37.1 C), temperature source Oral, resp. rate  18, height 5\' 2"  (1.575 m), weight 86.637 kg (191 lb), SpO2 100.00%, not currently breastfeeding. GENERAL: Well-developed, well-nourished female in no acute distress.  HEENT: Normocephalic. No nasal mucosa redness or discharge. Throat clear.  HEART: normal rate RESP: normal effort. CTAB ABDOMEN: Soft, non-tender EXTREMITIES: Nontender, no edema  LAB RESULTS Results for orders placed during the hospital encounter of 05/14/12 (from the past 24 hour(s))  POCT PREGNANCY, URINE     Status: Abnormal   Collection Time   05/14/12  6:10 PM      Component Value Range   Preg Test, Ur POSITIVE (*) NEGATIVE    IMAGING Bedside US by me: Fetal cardiac activity seen    ASSESSMENT 1. URI, acute   2. Pregnancy, incidental   3. Constipation   Approximately 6-7 wks IUP  PLAN Discharge home with pregnancy precautions, PNV, pregnnacy verification letter Medication List  As of 05/14/2012  6:50 PM   STOP taking these medications         ibuprofen 600 MG tablet         TAKE  these medications         acetaminophen 500 MG tablet   Commonly known as: TYLENOL   Take 500 mg by mouth every 6 (six) hours as needed. For pain      docusate sodium 100 MG capsule   Commonly known as: COLACE   Take 1 capsule (100 mg total) by mouth 2 (two) times daily as needed for constipation.      prenatal vitamin w/FE, FA 27-1 MG Tabs   Take 1 tablet by mouth daily.          Follow up with provider of choice from list given. May use neosynephrine nose gtts, Tylenol for URI Advised Sennokot. AVS instructions on constipation, URI   Danae Orleans, CNM 05/14/2012  6:16 PM

## 2012-05-14 NOTE — MAU Note (Signed)
+  HPT end of Aug/ beginning of Sept.

## 2012-05-14 NOTE — MAU Note (Signed)
Guess having a cold for past couple days.  Taking tylenol, not helping.  Has pain in nose, is stopped up.  Throat hurts, headache.  Non- productive cough

## 2012-05-21 LAB — OB RESULTS CONSOLE HEPATITIS B SURFACE ANTIGEN: Hepatitis B Surface Ag: NEGATIVE

## 2012-05-21 LAB — OB RESULTS CONSOLE GC/CHLAMYDIA: Chlamydia: NEGATIVE

## 2012-05-21 LAB — OB RESULTS CONSOLE ABO/RH: RH Type: POSITIVE

## 2012-05-21 LAB — OB RESULTS CONSOLE HIV ANTIBODY (ROUTINE TESTING): HIV: NONREACTIVE

## 2012-05-21 LAB — OB RESULTS CONSOLE RPR: RPR: NONREACTIVE

## 2012-05-21 LAB — OB RESULTS CONSOLE RUBELLA ANTIBODY, IGM: Rubella: IMMUNE

## 2012-06-02 ENCOUNTER — Ambulatory Visit (INDEPENDENT_AMBULATORY_CARE_PROVIDER_SITE_OTHER): Payer: Self-pay | Admitting: Obstetrics and Gynecology

## 2012-06-02 DIAGNOSIS — Z331 Pregnant state, incidental: Secondary | ICD-10-CM

## 2012-06-02 NOTE — Progress Notes (Signed)
NOB interview with pt's mother present.. Pt transferring from [facility] without records.  States had labs and U/S. ROI signed. Pt does not know pregravid weight. Denies any concerns. Pt states does not currently have counselor to manage depression. Has not taken any meds recently. Denies suicidal or homicidal thoughts.  Referred to [facility] or [facility]. Pt agreeable.

## 2012-06-21 ENCOUNTER — Ambulatory Visit (INDEPENDENT_AMBULATORY_CARE_PROVIDER_SITE_OTHER): Payer: Medicaid Other

## 2012-06-21 ENCOUNTER — Ambulatory Visit (INDEPENDENT_AMBULATORY_CARE_PROVIDER_SITE_OTHER): Payer: Medicaid Other | Admitting: Obstetrics and Gynecology

## 2012-06-21 VITALS — BP 110/60 | Wt 193.0 lb

## 2012-06-21 DIAGNOSIS — O368390 Maternal care for abnormalities of the fetal heart rate or rhythm, unspecified trimester, not applicable or unspecified: Secondary | ICD-10-CM

## 2012-06-21 DIAGNOSIS — N760 Acute vaginitis: Secondary | ICD-10-CM

## 2012-06-21 DIAGNOSIS — B9689 Other specified bacterial agents as the cause of diseases classified elsewhere: Secondary | ICD-10-CM | POA: Insufficient documentation

## 2012-06-21 DIAGNOSIS — Z3490 Encounter for supervision of normal pregnancy, unspecified, unspecified trimester: Secondary | ICD-10-CM

## 2012-06-21 DIAGNOSIS — Z331 Pregnant state, incidental: Secondary | ICD-10-CM

## 2012-06-21 DIAGNOSIS — K2990 Gastroduodenitis, unspecified, without bleeding: Secondary | ICD-10-CM

## 2012-06-21 DIAGNOSIS — K219 Gastro-esophageal reflux disease without esophagitis: Secondary | ICD-10-CM

## 2012-06-21 DIAGNOSIS — K2970 Gastritis, unspecified, without bleeding: Secondary | ICD-10-CM

## 2012-06-21 DIAGNOSIS — O4700 False labor before 37 completed weeks of gestation, unspecified trimester: Secondary | ICD-10-CM

## 2012-06-21 DIAGNOSIS — R112 Nausea with vomiting, unspecified: Secondary | ICD-10-CM

## 2012-06-21 DIAGNOSIS — A499 Bacterial infection, unspecified: Secondary | ICD-10-CM

## 2012-06-21 LAB — US OB LIMITED

## 2012-06-21 MED ORDER — PANTOPRAZOLE SODIUM 40 MG PO TBEC: 40.0000 mg | DELAYED_RELEASE_TABLET | Freq: Every day | ORAL | Status: DC

## 2012-06-21 MED ORDER — METRONIDAZOLE 500 MG PO TABS: 500.0000 mg | ORAL_TABLET | Freq: Three times a day (TID) | ORAL | Status: AC

## 2012-06-21 MED ORDER — ONDANSETRON 4 MG PO TBDP: 4.0000 mg | ORAL_TABLET | Freq: Three times a day (TID) | ORAL | Status: DC | PRN

## 2012-06-21 NOTE — Progress Notes (Signed)
[redacted]w[redacted]d Subjective:    Lindsay Ramirez is being seen today for her first obstetrical visit at [redacted]w[redacted]d gestation by LMP. The patient had a baby 4 months ago and had not used contraception.  She reports nausea and severe acid reflux in this pregnancy.   Her obstetrical history is significant for: The patient has had 3 NSVD. In her second pregnancy she had an episode of "Threathened PTL" but carried to term.  Patient Active Problem List  Diagnosis  . Ankle sprain  . Threatened premature labor, antepartum  Surgical Hx: R/o Wisdom Teeth with no complications from Anesthesia.  Relationship with FOB:  involved and has been with her partner for 6 years.  Feeding plan:  Combined Breast and bottle feeding.   Pregnancy history fully reviewed.    Review of Systems Pertinent ROS is described in HPI   Objective:   BP 110/60  Wt 193 lb (87.544 kg)  LMP 04/11/2012  Breastfeeding? Unknown Wt Readings from Last 1 Encounters:  06/21/12 193 lb (87.544 kg)   BMI: 34.1 Early Glucola advised.  General: alert, cooperative and no distress Respiratory: clear to auscultation bilaterally Cardiovascular: regular rate and rhythm, S1, S2 normal, no murmur Breasts:  No dominant masses, nipples erect, Tender on exmination. Gastrointestinal: soft, non-tender; no masses,  no organomegaly Extremities: extremities normal, no pain or edema Vaginal Bleeding: None  EXTERNAL GENITALIA: normal appearing vulva with no masses, tenderness or lesions VAGINA: no abnormal discharge or lesions CERVIX: no lesions or cervical motion tenderness; cervix closed, long, firm UTERUS: gravid and consistent with 10 weeks ADNEXA: no masses palpable and nontender OB EXAM PELVIMETRY: appears adequate   FHR:  151 bpm by USS today.  Assessment:    Pregnancy at  [redacted]w[redacted]d  Plan:     Prenatal panel reviewed and discussed with the patient:yes Pap smear collected:yes GC/Chlamydia collected:yes from Pap Wet prep:  PH 4.5  Clue Cells seen - c/w BV Discussion of Genetic testing options: Prenatal vitamins recommended Problem list reviewed and updated.  Plan of care: Follow up in 4 weeks for ROB and TOC for BV    Earl Gala, CNM.

## 2012-06-21 NOTE — Assessment & Plan Note (Signed)
Had Threatened PTL in 2nd pregnancy

## 2012-06-21 NOTE — Progress Notes (Signed)
NOB work-up Pt stated she thinks she has a sinus infection/ and yesterday pt stated she was throwing up some blood. Pt stated last pap was in 09/2011 wnl

## 2012-07-19 ENCOUNTER — Encounter: Payer: Medicaid Other | Admitting: Obstetrics and Gynecology

## 2012-07-26 ENCOUNTER — Ambulatory Visit (INDEPENDENT_AMBULATORY_CARE_PROVIDER_SITE_OTHER): Payer: Medicaid Other | Admitting: Obstetrics and Gynecology

## 2012-07-26 VITALS — BP 110/64 | Wt 196.0 lb

## 2012-07-26 DIAGNOSIS — O09899 Supervision of other high risk pregnancies, unspecified trimester: Secondary | ICD-10-CM

## 2012-07-26 DIAGNOSIS — Z23 Encounter for immunization: Secondary | ICD-10-CM

## 2012-07-26 DIAGNOSIS — K219 Gastro-esophageal reflux disease without esophagitis: Secondary | ICD-10-CM

## 2012-07-26 DIAGNOSIS — E559 Vitamin D deficiency, unspecified: Secondary | ICD-10-CM

## 2012-07-26 DIAGNOSIS — Z331 Pregnant state, incidental: Secondary | ICD-10-CM

## 2012-07-26 MED ORDER — METRONIDAZOLE 0.75 % VA GEL: VAGINAL | Status: DC

## 2012-07-26 MED ORDER — AMBULATORY NON FORMULARY MEDICATION: Status: DC

## 2012-07-26 NOTE — Progress Notes (Signed)
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  Zantac 150 mg hs and am rx'd  Pt complains of right hip pain.  Ibuprofen 600mg  q 6h prn  Nausea.  Zofran encouraged Reviewed labs from [facility].  Hgb electrophoresis ordered. Vit D level=17.  rec Vit D 4000IU daily Quad screen today [place] for anatomy NV

## 2012-07-27 ENCOUNTER — Other Ambulatory Visit: Payer: Self-pay

## 2012-07-27 DIAGNOSIS — Z3689 Encounter for other specified antenatal screening: Secondary | ICD-10-CM

## 2012-07-27 LAB — AFP, QUAD SCREEN
Age Alone: 1:1030 {titer}
Down Syndrome Scr Risk Est: 1:38500 {titer}
HCG, Total: 9956 m[IU]/mL
INH: 78.1 pg/mL
MoM for INH: 0.43
MoM for hCG: 0.37
Open Spina bifida: NEGATIVE
Tri 18 Scr Risk Est: NEGATIVE
Trisomy 18 (Edward) Syndrome Interp.: <1:18200 {titer}

## 2012-07-28 LAB — HEMOGLOBINOPATHY EVALUATION
Hgb A: 55.6 % — ABNORMAL LOW (ref 96.8–97.8)
Hgb S Quant: 41.1 % — ABNORMAL HIGH

## 2012-08-09 ENCOUNTER — Encounter: Payer: Self-pay | Admitting: Obstetrics and Gynecology

## 2012-08-09 DIAGNOSIS — D573 Sickle-cell trait: Secondary | ICD-10-CM | POA: Insufficient documentation

## 2012-08-18 ENCOUNTER — Telehealth: Payer: Self-pay | Admitting: Obstetrics and Gynecology

## 2012-08-23 ENCOUNTER — Encounter: Payer: Self-pay | Admitting: Obstetrics and Gynecology

## 2012-08-23 ENCOUNTER — Ambulatory Visit (INDEPENDENT_AMBULATORY_CARE_PROVIDER_SITE_OTHER): Payer: Self-pay | Admitting: Obstetrics and Gynecology

## 2012-08-23 ENCOUNTER — Ambulatory Visit (INDEPENDENT_AMBULATORY_CARE_PROVIDER_SITE_OTHER): Payer: Medicaid Other

## 2012-08-23 VITALS — BP 104/64 | Wt 200.0 lb

## 2012-08-23 DIAGNOSIS — Z3689 Encounter for other specified antenatal screening: Secondary | ICD-10-CM

## 2012-08-23 DIAGNOSIS — Z331 Pregnant state, incidental: Secondary | ICD-10-CM

## 2012-08-23 NOTE — Progress Notes (Signed)
[redacted]w[redacted]d Ultrasound: Single gestation, normal fluid, cervix 4.06 cm, bilateral pyelectasis, 2.8 cm cystic area within the umbilical cord, and other anatomy appears normal. Refer to maternal fetal medicine for evaluation and repeat ultrasound. Return to office in 4 weeks. Dr. Stefano Gaul

## 2012-08-23 NOTE — Progress Notes (Signed)
[redacted]w[redacted]d Pt c/o cramping.

## 2012-08-25 ENCOUNTER — Other Ambulatory Visit: Payer: Self-pay | Admitting: Obstetrics and Gynecology

## 2012-08-25 ENCOUNTER — Telehealth: Payer: Self-pay | Admitting: Obstetrics and Gynecology

## 2012-08-25 DIAGNOSIS — Z3689 Encounter for other specified antenatal screening: Secondary | ICD-10-CM

## 2012-08-25 DIAGNOSIS — O358XX0 Maternal care for other (suspected) fetal abnormality and damage, not applicable or unspecified: Secondary | ICD-10-CM

## 2012-08-25 NOTE — Telephone Encounter (Signed)
Message copied by Mason Jim on Wed Aug 25, 2012  4:25 PM ------      Message from: Janine Limbo      Created: Mon Aug 23, 2012 10:15 PM      Regarding: needs maternal-fetal medicine consult       Please make an appointment for the patient be seen by maternal fetal medicine.  She has bilateral pyelectasis.  She also has a cyst in the umbilical cord.            Dr. Stefano Gaul

## 2012-08-25 NOTE — Telephone Encounter (Signed)
MFM appt scheduled 09/07/12 at 2:00.   TC to pt. Informed of appt.  Also scheduled for ROB in 4 weeks , 09/21/11.  Pt verbalizes comprehension.

## 2012-08-26 ENCOUNTER — Telehealth: Payer: Self-pay | Admitting: Obstetrics and Gynecology

## 2012-08-26 NOTE — Telephone Encounter (Signed)
TC to pt.  Informed incorrect appt. Given  To keep appt at MFM. Pt verbalizes comprehension.

## 2012-08-26 NOTE — Telephone Encounter (Signed)
TC to pt. Informed of appt at [facility] today. States daughter has appt today.  Pt to call to R/S fo r12/20/13.  .Pt states will do so. Phone # given.

## 2012-08-30 LAB — US OB COMP + 14 WK

## 2012-09-02 ENCOUNTER — Telehealth: Payer: Self-pay | Admitting: Obstetrics and Gynecology

## 2012-09-02 NOTE — Telephone Encounter (Signed)
TC to pt. States is having cramping especially with walking and constipation. Last BM 3 days ago. Is only drinking 2 glasses water/day.  Advised to increase to 8-10 glasses.  May use Dulcolax suppository or Miralax and then take Colace daily once has moved bowels. Discussed high fiber diet. Pt to call if cramping continues, with bleeding or no relief of constipation. Pt verbalizes comprehension.

## 2012-09-07 ENCOUNTER — Encounter (HOSPITAL_COMMUNITY): Payer: Self-pay

## 2012-09-07 ENCOUNTER — Ambulatory Visit (HOSPITAL_COMMUNITY)
Admission: RE | Admit: 2012-09-07 | Discharge: 2012-09-07 | Disposition: A | Discharge status: Home / Self Care | Payer: Medicaid Other | Source: Ambulatory Visit | Attending: Obstetrics and Gynecology | Admitting: Obstetrics and Gynecology

## 2012-09-07 VITALS — BP 108/61 | HR 62 | Wt 201.5 lb

## 2012-09-07 DIAGNOSIS — Z1389 Encounter for screening for other disorder: Secondary | ICD-10-CM | POA: Insufficient documentation

## 2012-09-07 DIAGNOSIS — Z3689 Encounter for other specified antenatal screening: Secondary | ICD-10-CM

## 2012-09-07 DIAGNOSIS — Z363 Encounter for antenatal screening for malformations: Secondary | ICD-10-CM | POA: Insufficient documentation

## 2012-09-07 DIAGNOSIS — O358XX0 Maternal care for other (suspected) fetal abnormality and damage, not applicable or unspecified: Secondary | ICD-10-CM

## 2012-09-07 NOTE — Consult Note (Signed)
Maternal Fetal Medicine Consultation  Requesting Provider(s): Michael Litter, MD  Reason for consultation: Bilateral renal pylectasis, suspected umbilical cord cyst  HPI: Lindsay Ramirez is a 26 yo [id number] currently at 41 1/7 weeks who is seen today due to bilateral renal pylectasis and suspected umbilical cord cyst on recent ultrasound.  The patient previously underwent quad screen that was low risk for aneuploidy.  Her prenatal course has also been complicated by hx of Sickle cell trait - she reports that the father of the baby has been tested and does not carry Sickle cell trait.  She is without complaints today.  OB History: OB History    Grav Para Term Preterm Abortions TAB SAB Ect Mult Living   4 3 3  0 0 0 0 0 0 3    Term SVD x 3 without complications  PMH:  Past Medical History  Diagnosis Date  . Sickle cell trait   . Mental disorder   . Bipolar affective disorder   . Bipolar affective disorder 2006    TOOK MEDS X 3 YR  . Depression     PP 2013  . Abnormal Pap smear     LAST PAP 06/2011  . Asthma     [facility]HAL[facility]; [facility]  . Hepatitis 2011    HEPATITIS B ????/  . Infection     UTI  . Anemia     CHRONIC  . Headache     SEVERAL TIMES/DAY    PSH:  Past Surgical History  Procedure Date  . Mouth surgery   . No past surgeries   . Wisdom tooth extraction 2012   Meds:  Prenatal vitamins Vitamin D Protonix Zofran ODT  Allergies: NKDA  FH: Neg for birth defects and hereditary disorders  Soc: Denies tobacco use, alcohol or illicit drug use   PE:   Filed Vitals:   09/07/12 1447  BP: 108/61  Pulse: 62    GEN: well-appearing female ABD: gravid, NT  Ultrasound: Single IUP at 23 1/7 weeks by dates Bilateral renal pylectasis is noted without calyceal dilation (7.6 cm on right; 8.5 cm on left) A 3 cm x 2.3 cm umbilical cord cyst is noted.  Although located on the proximal umbilical cord, it is clearly separate from the abdominal wall insertion site No  other fetal anomalies noted Normal amniotic fluid volume   A/P: 1) Single IUP at 23 1/7 weeks         2) Umbilical cord cyst - in general, isolated/ single umbilical cord cysts are associated with a good fetal prognosis while multiple umbilical cord cysts are more frequently associated with aneuploidy and fetal anomalies.  On ultrasound, the umbilical cord cyst is clearly separate from the abdominal wall - there is no evidence of oomphalocele.  Given the size of the umbilical cord cyst, recommend serial growth scans.  The patient is currently scheduled for follow up in 4 weeks - will reevaluate growth, fetal kidneys and the umbilical cord cyst at that time.         3) Bilateral renal pylectasis - findings were reviewed with the patient.  There is currently no evidence of renal calyceal dilation / hydronephrosis.  Will continue to follow.  If persistent, recommend imaging study of the newborn after delivery.  The patient is aware that bilateral renal pylectasis is considered a soft marker for Down syndrome, but with a low risk Quad screen and the patient's age, would not recommend further testing at this time.   Thank  you for the opportunity to be a part of the care of Lindsay Ramirez. Please contact our office if we can be of further assistance.   I spent approximately 30 minutes with this patient with over 50% of time spent in face-to-face counseling.  Alpha Gula, MD Maternal Fetal Medicine

## 2012-09-08 NOTE — L&D Delivery Note (Signed)
Delivery Note   At 8:54 PM a viable female was delivered via Vaginal, Spontaneous Delivery (Presentation: Right Occiput Anterior).  APGAR: 7, 9; weight: pending   Placenta status: Intact, Spontaneous Pathology.  Cord: 3 vessels with the following complications: None.  Cord pH: n/a About 4cm distal to umbilicus diameter of umbilical cord enlarged (cyst noted on [place])   Anesthesia: Epidural  Episiotomy: None Lacerations: None Suture Repair: n/a Est. Blood Loss (mL): 200  Mom to postpartum.  Baby to nursery-stable. Pt notified me after delivery that family member is going to be adopting baby, will order SW consult  Notified approx 30 min after delivery, pt's temp elevated, will give tylenol and motrin and CTO at present, staff to notify nursery   Malissa Hippo 12/12/2012, 9:36 PM

## 2012-09-10 ENCOUNTER — Telehealth: Payer: Self-pay | Admitting: Obstetrics and Gynecology

## 2012-09-10 NOTE — Telephone Encounter (Signed)
Pt called, states beginning at around 0100-0200 today started having vomiting, has so far vomited a total of 9 times today.  Pt also c/o diarrhea and having some abdominal cramping.  Pt denies any vaginal bleeding and fever, however has not checked to see if she had a fever.  Pt reports that her 3 children all have/had stomach viruses.  Pt advised to consume a clear liquid diet and BRAT diet, allow the virus to pass and to call the office with any concerns.  Pt voices agreement.

## 2012-09-20 ENCOUNTER — Encounter: Payer: Medicaid Other | Admitting: Obstetrics and Gynecology

## 2012-09-27 ENCOUNTER — Ambulatory Visit: Payer: Medicaid Other | Admitting: Obstetrics and Gynecology

## 2012-09-27 VITALS — BP 106/58 | Wt 202.0 lb

## 2012-09-27 DIAGNOSIS — Z331 Pregnant state, incidental: Secondary | ICD-10-CM

## 2012-09-27 LAB — CBC
HCT: 31.3 % — ABNORMAL LOW (ref 36.0–46.0)
Hemoglobin: 10.4 g/dL — ABNORMAL LOW (ref 12.0–15.0)
MCH: 26.7 pg (ref 26.0–34.0)
MCHC: 33.2 g/dL (ref 30.0–36.0)
MCV: 80.5 fL (ref 78.0–100.0)
RBC: 3.89 MIL/uL (ref 3.87–5.11)

## 2012-09-27 NOTE — Progress Notes (Signed)
[redacted]w[redacted]d Pt doing well. Discussed MFM US findings -- f/u visit scheduled 1/29 for recheck of umbilcal cord cyst and renal pyelectasis Glucola Hgb RPR today

## 2012-09-27 NOTE — Progress Notes (Signed)
Pt will discuss with mom rgd GTT/ c/o back pain . Pt stated no other  issues today. glucola given  11:23 draw @ 12:23.

## 2012-09-28 ENCOUNTER — Encounter: Payer: Self-pay | Admitting: Obstetrics and Gynecology

## 2012-09-28 DIAGNOSIS — O99012 Anemia complicating pregnancy, second trimester: Secondary | ICD-10-CM | POA: Insufficient documentation

## 2012-09-28 LAB — RPR

## 2012-09-29 ENCOUNTER — Telehealth: Payer: Self-pay | Admitting: Obstetrics and Gynecology

## 2012-09-29 NOTE — Telephone Encounter (Signed)
Pt called, states she has experienced some anal bleeding when she wiped after having a BM.  Pt states that she is constipated and is unsure if she has hemorrhoids.  Pt has not taken any measures to relieve her constipation.  Pt denies the blood being seen in the toilet, only when she wipes.  Pt advised to refer to booklet and try pushing fluids, drink warm fluids, stool softener BID, Miralax or Dulcolax suppository.  If she has hemorrhoids to try Tucks pads, witch hazel or Preparation H and refer to booklet, does report + fm.  Pt advised to call if no relief or if sxs worsen, pt voices agreement.

## 2012-10-03 ENCOUNTER — Telehealth: Payer: Self-pay | Admitting: Obstetrics and Gynecology

## 2012-10-03 DIAGNOSIS — O358XX0 Maternal care for other (suspected) fetal abnormality and damage, not applicable or unspecified: Secondary | ICD-10-CM

## 2012-10-03 NOTE — Telephone Encounter (Signed)
TC from patient--26-27 weeks, sporadic sharp pain on sides and in vagina. Denies UCs, leaking, bleeding, dysuria or any other issues.  Reports +FM. Has upcoming f/u US at MFM on 1/28 due to umbilical cord cyst and bilateral pyelectasis.  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 May try Ibuprophen 600 mg po q 6 hours x 24 hour--may use Tylenol beyond that. Other comfort measures reviewed. Call prn.

## 2012-10-06 ENCOUNTER — Ambulatory Visit (HOSPITAL_COMMUNITY): Admission: RE | Admit: 2012-10-06 | Payer: Medicaid Other | Source: Ambulatory Visit

## 2012-10-11 ENCOUNTER — Other Ambulatory Visit: Payer: Self-pay | Admitting: Obstetrics and Gynecology

## 2012-10-11 ENCOUNTER — Ambulatory Visit (HOSPITAL_COMMUNITY): Payer: Medicaid Other

## 2012-10-11 DIAGNOSIS — O2690 Pregnancy related conditions, unspecified, unspecified trimester: Secondary | ICD-10-CM

## 2012-10-13 ENCOUNTER — Telehealth: Payer: Self-pay | Admitting: Obstetrics and Gynecology

## 2012-10-13 NOTE — Telephone Encounter (Signed)
Pt had not arrived to MAU as previously discused, Attempt to call pt, LM at cell # listed in chart, then called home #, female answered and said she wasn't available, LM for her to call us back at anytime.

## 2012-10-13 NOTE — Telephone Encounter (Signed)
TC to pt. States is having difficulty sleeping.  Then states has had vomiting x 2 days and has not been able to retain food or fluids. Having body aches.  States + FM. +cramping.  Pt then states is feeling depressed and has thought about harming herself. Does not have a plan. After consult with Dr Maryellen Pile, pt advised to go to [facility] ER. Pt contracted not to harm herself before eval at ER. States currently has someone with her but will contact mother for transportation .  To arrive at hospital prior to 4:00 PM. TC to charge nurse  Candise Bowens at [facility]. Pt's history given. Given DR VPH  Phone number to call with any OB concerns.  Jen states  ACT team is available to see pt at MAU.  Candise Bowens states she will contact pt and advise her to go to [facility].  Return call from [place] who states she spoke with pt and pt is agreeable to be seen at MAU. Dr VPH notified of plan. TC to pt. States is going to MAU.

## 2012-10-14 ENCOUNTER — Telehealth: Payer: Self-pay | Admitting: Obstetrics and Gynecology

## 2012-10-14 NOTE — Telephone Encounter (Signed)
Message copied by Mason Jim on Thu Oct 14, 2012  8:40 AM ------      Message from: Malissa Hippo.      Created: Thu Oct 14, 2012 12:05 AM      Regarding: please call to check on pt, she never came to MAU       See note            SL

## 2012-10-14 NOTE — Telephone Encounter (Signed)
LM to return call to call ASAP.

## 2012-10-14 NOTE — Telephone Encounter (Signed)
Message copied by Mason Jim on Thu Oct 14, 2012  8:15 AM ------      Message from: Malissa Hippo.      Created: Thu Oct 14, 2012 12:05 AM      Regarding: please call to check on pt, she never came to MAU       See note            SL

## 2012-10-14 NOTE — Telephone Encounter (Signed)
TC to pt.  Spoke with female. States pt is not there at present but should return shortly. LM that MD office called.

## 2012-10-14 NOTE — Telephone Encounter (Signed)
Spoke with Dr Maryellen Pile. Informed attempted to contact pt x 2 and LM. Per DR VPH, no further F/U indicated at this time.

## 2012-10-15 ENCOUNTER — Ambulatory Visit (HOSPITAL_COMMUNITY)
Admission: RE | Admit: 2012-10-15 | Discharge: 2012-10-15 | Disposition: A | Discharge status: Home / Self Care | Payer: Medicaid Other | Source: Ambulatory Visit | Attending: Obstetrics and Gynecology | Admitting: Obstetrics and Gynecology

## 2012-10-15 VITALS — BP 106/61 | HR 79 | Wt 203.8 lb

## 2012-10-15 DIAGNOSIS — D649 Anemia, unspecified: Secondary | ICD-10-CM

## 2012-10-15 DIAGNOSIS — O2690 Pregnancy related conditions, unspecified, unspecified trimester: Secondary | ICD-10-CM

## 2012-10-15 DIAGNOSIS — O358XX0 Maternal care for other (suspected) fetal abnormality and damage, not applicable or unspecified: Secondary | ICD-10-CM

## 2012-10-15 NOTE — Progress Notes (Signed)
Lindsay Ramirez  was seen today for an ultrasound appointment.  See full report in [facility].  Impression: Single IUP at 28 4/7 weeks by dates A 3 cm x 3 cm umbilical cord cyst is again noted on the proximal aspect of the umbilical cord.  The umbilical cord appears somewhat more thickened distally to the umbilical cord cyst than was previously noted (? wharton's jelly) The renal pelvi appear prominent but do not meet criteria for renal pylectasis (4 mm on the right; 6 mm on the left). Interval growth is appropriate (27th %tile), but the [facility] measures at the 5th %tile. (asymmetric growth) Normal umbilical artery Dopplers studies noted for gestational age. Normal amniotic fluid volume  Recommendations: Due to lagging [facility] and large umbilical cord cyst, recommend weekly BPP with UA Dopplers until 32 weeks and 2x weekly NSTs thereafter (please contact our office if you would prefer to have this testing performed here) Follow up for interval growth in 3 weeks.  Alpha Gula, MD

## 2012-10-18 ENCOUNTER — Ambulatory Visit: Payer: Medicaid Other | Admitting: Obstetrics and Gynecology

## 2012-10-18 ENCOUNTER — Telehealth: Payer: Self-pay | Admitting: Obstetrics and Gynecology

## 2012-10-18 ENCOUNTER — Other Ambulatory Visit: Payer: Self-pay

## 2012-10-18 ENCOUNTER — Encounter: Payer: Medicaid Other | Admitting: Obstetrics and Gynecology

## 2012-10-18 VITALS — BP 132/62 | Wt 204.0 lb

## 2012-10-18 DIAGNOSIS — O368190 Decreased fetal movements, unspecified trimester, not applicable or unspecified: Secondary | ICD-10-CM

## 2012-10-18 NOTE — Progress Notes (Signed)
[redacted]w[redacted]d Pt worked in for decreased FM  Pt states she has not felt baby move but one time today around 11 am.  Pt did not feel baby move at all yesterday .

## 2012-10-18 NOTE — Telephone Encounter (Signed)
TC from pt. States did not feel FM 10/17/12.  Thinks moved x 1 today.  Has been eating and drinking. Pt to office for eval.ASAP.

## 2012-10-18 NOTE — Progress Notes (Signed)
[redacted]w[redacted]d 1. The patient called last week complaining of "feeling bad" and depression.  She stated last week that she may hurt herself.  She was instructed to go to the emergency department for evaluation and treatment.  She did not go.  Multiple attempts were made to contact the patient which were not successful.  The patient reports that she does not feel bad today.  She denies suicidal and homicidal thoughts.  She stated that she was stressed last week.  The patient agrees to speak with a counselor.  Arrangements will be made.  Her affect is totally appropriate today.  She makes good eye contact.  She smiles as we talk about her child and her care. 2. The patient complains of decreased fetal movement today.  The nonstress test was reactive today.  She reports that the baby hasn't moved twice since she has been in the office. 3. The patient has a known umbilical cord cyst.  An ultrasound was performed at maternal fetal medicine on October 15, 2012.  The ultrasound showed a 2 lbs. 5 oz. Infant (27th percentile).  The abdominal circumference was at the 5th percentile.  An umbilical Doppler was performed that was normal.  There was a 3 cm x 3 cm umbilical cord cyst noted.  The amniotic fluid volume was normal.  The cervix measured 3.5 cm.  MFM recommended weekly biophysical profiles with umbilical artery Dopplers.  At 32 weeks they recommend that we begin doing nonstress tests twice each week. The patient agrees.  She will return to the office tomorrow (when she already has an appointment) and have the studies done. 4. Glucola equals 69.  Hemoglobin was 10.4.  Platelets equal 240,000.  RPR nonreactive. Dr. Stefano Gaul

## 2012-10-19 ENCOUNTER — Telehealth: Payer: Self-pay

## 2012-10-19 ENCOUNTER — Ambulatory Visit: Payer: Medicaid Other

## 2012-10-19 ENCOUNTER — Encounter: Payer: Self-pay | Admitting: Obstetrics and Gynecology

## 2012-10-19 ENCOUNTER — Ambulatory Visit: Payer: Medicaid Other | Admitting: Obstetrics and Gynecology

## 2012-10-19 VITALS — BP 90/56 | Wt 203.0 lb

## 2012-10-19 DIAGNOSIS — O358XX0 Maternal care for other (suspected) fetal abnormality and damage, not applicable or unspecified: Secondary | ICD-10-CM

## 2012-10-19 DIAGNOSIS — O368190 Decreased fetal movements, unspecified trimester, not applicable or unspecified: Secondary | ICD-10-CM

## 2012-10-19 DIAGNOSIS — O35EXX0 Maternal care for other (suspected) fetal abnormality and damage, fetal genitourinary anomalies, not applicable or unspecified: Secondary | ICD-10-CM

## 2012-10-19 NOTE — Progress Notes (Signed)
[redacted]w[redacted]d Patient reports feeling well today and denies SI. She also reports good FM. U/S report: cervix length 4.22cm, AFI 20.48 cm, BPP 8/8 in 12 minutes, cervix closed.  Umbilical cord cyst noted, Bilateral pyelectasis (slightly increased on both kidneys (R=0.73 cm, L=0.80cm, Dopplers are normal. Reviewed nl quad screen and questions answered Cont weekly BPP with dopplers per MFM recs and start 2x/wk NSTs at 32wks I would also like Dr. Claudean Severance to comment on mode of delivery if there are any contraindications with that umbilical cyst to a vag delivery. ROB in 1wk for BPP and dopplers FKCs

## 2012-10-19 NOTE — Telephone Encounter (Signed)
Referral to [facility] Faxed today per AVS.

## 2012-10-20 ENCOUNTER — Telehealth: Payer: Self-pay | Admitting: Obstetrics and Gynecology

## 2012-10-20 NOTE — Telephone Encounter (Signed)
Message copied by Mason Jim on Wed Oct 20, 2012  9:00 AM ------      Message from: Osborn Coho      Created: Tue Oct 19, 2012 12:47 PM       Please contact MFM and ask if they can have Dr. Claudean Severance comment on mode of delivery with this patient who has an umbilical cyst.  He saw her last week. Thank you ------

## 2012-10-20 NOTE — Telephone Encounter (Signed)
TC to [place] at [facility]. Informed Dr Alinda Sierras would like Dr Claudean Severance comment on mode of delivery since pt has umbilical cyst.  Pt has appt 11/04/12. Will make note to review at that visit.

## 2012-10-25 ENCOUNTER — Telehealth: Payer: Self-pay

## 2012-10-25 NOTE — Telephone Encounter (Signed)
Pt has appt at [facility] on 11-09-12 @ 11am. Will make AVS aware.  [facility] CMA

## 2012-10-27 ENCOUNTER — Other Ambulatory Visit: Payer: Self-pay

## 2012-10-28 ENCOUNTER — Other Ambulatory Visit: Payer: Self-pay | Admitting: Obstetrics and Gynecology

## 2012-10-28 ENCOUNTER — Ambulatory Visit: Payer: Medicaid Other

## 2012-10-28 ENCOUNTER — Ambulatory Visit: Payer: Medicaid Other | Admitting: Certified Nurse Midwife

## 2012-10-28 ENCOUNTER — Encounter: Payer: Self-pay | Admitting: Obstetrics and Gynecology

## 2012-10-28 VITALS — BP 92/58 | Wt 204.0 lb

## 2012-10-28 NOTE — Progress Notes (Signed)
[redacted]w[redacted]d BPP  today  Pt has no complaints

## 2012-10-28 NOTE — Progress Notes (Signed)
[redacted]w[redacted]d Pt here today for ROB and BPP/UA Doppler U/S: cx closed; AFI 15.2 cm; BPP 8/8 in 13 min; umbilical cord cyst still noted - no change Kidneys visualized but no comment on status of pyelectasis Rv'd reason for BPP, and rv'd rec for NST 2x/week after 32 weeks Pt asked about rec for mode of delivery - has an appt with Dr. Claudean Severance 2/27 for him to discuss with her. BPP and UA doppler in 1 week ROB in 2 weeks

## 2012-11-01 ENCOUNTER — Other Ambulatory Visit: Payer: Self-pay

## 2012-11-04 ENCOUNTER — Ambulatory Visit (HOSPITAL_COMMUNITY)
Admission: RE | Admit: 2012-11-04 | Discharge: 2012-11-04 | Disposition: A | Discharge status: Home / Self Care | Payer: Medicaid Other | Source: Ambulatory Visit | Attending: Obstetrics and Gynecology | Admitting: Obstetrics and Gynecology

## 2012-11-04 ENCOUNTER — Encounter (HOSPITAL_COMMUNITY): Payer: Self-pay

## 2012-11-04 ENCOUNTER — Other Ambulatory Visit (HOSPITAL_COMMUNITY): Payer: Self-pay | Admitting: Maternal and Fetal Medicine

## 2012-11-04 DIAGNOSIS — D649 Anemia, unspecified: Secondary | ICD-10-CM

## 2012-11-04 DIAGNOSIS — O358XX0 Maternal care for other (suspected) fetal abnormality and damage, not applicable or unspecified: Secondary | ICD-10-CM | POA: Insufficient documentation

## 2012-11-05 ENCOUNTER — Other Ambulatory Visit: Payer: Self-pay | Admitting: Certified Nurse Midwife

## 2012-11-05 ENCOUNTER — Ambulatory Visit: Payer: Medicaid Other | Admitting: Family Medicine

## 2012-11-05 ENCOUNTER — Ambulatory Visit: Payer: Medicaid Other

## 2012-11-05 ENCOUNTER — Encounter: Payer: Self-pay | Admitting: Family Medicine

## 2012-11-05 VITALS — BP 120/64 | Wt 207.0 lb

## 2012-11-05 DIAGNOSIS — F329 Major depressive disorder, single episode, unspecified: Secondary | ICD-10-CM

## 2012-11-05 DIAGNOSIS — F32A Depression, unspecified: Secondary | ICD-10-CM

## 2012-11-05 DIAGNOSIS — Z331 Pregnant state, incidental: Secondary | ICD-10-CM

## 2012-11-05 MED ORDER — SERTRALINE HCL 25 MG PO TABS: 25.0000 mg | ORAL_TABLET | Freq: Every day | ORAL | Status: DC

## 2012-11-05 NOTE — Progress Notes (Signed)
[redacted]w[redacted]d U/S for today canceled. Pt had all done at MFM yesterday

## 2012-11-05 NOTE — Progress Notes (Signed)
[redacted]w[redacted]d S: Feeling frustrated and sad, down and depressed.  Patient reports she in the last week she has lost interest but no SI/HI.  She reports history of depression, but stopped taking her medication.   She would like to have a medication today if possible. O: Patient making periodic eye contact, but looking down.  Dressed appropriate for the weather.  Accompanied by mother and two children.  Depression PHQ-9 score 23.  Talked with patient at length about SE/MOA/and fetal effects, benefits vs. risk of treatment.  Encouraged her to f/u with therapy.   A: Pregnancy     Depression P: ROB in 1 week with NST     Zoloft 25 mg PO Daily, consulted with Dr. Normand Sloop.     U/S reviewed from MFM.  Continued umbilical cord cyst, mild dilation of extra-renal pelves, bilaterally, normal amniotic fluid volume and appropriate interval         growth with EFW at the 30th %tile, AC at 8th %tile, UA dolpplers were normal for GA, BPP 8/8 and patient without questions.     Needs a growth [place] in 3 weeks.     L.Nahiara Kretzschmar, FNP-BC

## 2012-11-05 NOTE — Patient Instructions (Addendum)
Sertraline oral solution What is this medicine? SERTRALINE (SER tra leen) is used to treat depression. It may also be used to treat obsessive compulsive disorder, panic disorder, post-trauma stress, premenstrual dysphoric disorder (PMDD) or social anxiety. This medicine may be used for other purposes; ask your health care provider or pharmacist if you have questions. What should I tell my health care provider before I take this medicine? They need to know if you have any of these conditions: -bipolar disorder or a family history of bipolar disorder -diabetes -heart disease -liver disease -receiving electroconvulsive therapy -seizures (convulsions) -suicidal thoughts, plans, or attempt by you or a family member -an unusual or allergic reaction to sertraline, other medicines, foods, dyes, or preservatives -pregnant or trying to get pregnant -breast-feeding How should I use this medicine? Take this medicine by mouth. Follow the directions on the prescription label. Before taking your dose, you need to dilute the solution in a beverage. Measure your medicine dose using the dropper in the bottle. Next, place the measured dose in at least 4 ounces (one-half cup) of water, ginger-ale, lemon-lime soda, lemonade or orange juice and mix. Do not mix in grapefruit juice or in any other liquids other than those listed. Drink all of mixed liquid immediately. Do not mix the dose and store it for later. It must be taken right away. You may take this medicine with or without food. Take your medicine at regular intervals. Do not take your medicine more often than directed. Do not stop taking this medicine suddenly except upon the advice of your doctor. Stopping this medicine too quickly may cause serious side effects or your condition may worsen. A special MedGuide will be given to you by the pharmacist with each prescription and refill. Be sure to read this information carefully each time. Talk to your  pediatrician regarding the use of this medicine in children. While this drug may be prescribed for children as young as 7 years for selected conditions, precautions do apply. Overdosage: If you think you have taken too much of this medicine contact a poison control center or emergency room at once. NOTE: This medicine is only for you. Do not share this medicine with others. What if I miss a dose? If you miss a dose, take it as soon as you can. If it is almost time for your next dose, take only that dose. Do not take double or extra doses. What may interact with this medicine? Do not take this medicine with any of the following medications: -disulfiram -linezolid -MAOIs like Carbex, Eldepryl, Marplan, Nardil, and Parnate -metronidazole -methylene blue (injected into a vein) -pimozide -thioridazine This medicine may also interact with the following medications: -alcohol -aspirin and aspirin-like medicines -certain medicines for depression, anxiety, or psychotic disturbances -certain medicines for migraine headaches like almotriptan, eletriptan, frovatriptan, naratriptan, rizatriptan, sumatriptan, zolmitriptan -certain medicines for seizures like carbamazepine, valproic acid, phenytoin -cimetidine -digoxin -diuretics -fentanyl -furazolidone -isoniazid -lithium -medicines that treat or prevent blood clots like warfarin, enoxaparin, and dalteparin -medicines to control heart rhythm like flecainide or propafenone -medicines for sleep -NSAIDs, medicines for pain and inflammation, like ibuprofen or naproxen -procarbazine -rasagiline -supplements like St. John's wort, kava kava, valerian -tolbutamide -tramadol -tryptophan This list may not describe all possible interactions. Give your health care provider a list of all the medicines, herbs, non-prescription drugs, or dietary supplements you use. Also tell them if you smoke, drink alcohol, or use illegal drugs. Some items may interact with  your medicine. What should I watch  for while using this medicine? Tell your doctor if your symptoms do not get better or if they get worse. Visit your doctor or health care professional for regular checks on your progress. Because it may take several weeks to see the full effects of this medicine, it is important to continue your treatment as prescribed by your doctor. Patients and their families should watch out for new or worsening thoughts of suicide or depression. Also watch out for sudden changes in feelings such as feeling anxious, agitated, panicky, irritable, hostile, aggressive, impulsive, severely restless, overly excited and hyperactive, or not being able to sleep. If this happens, especially at the beginning of treatment or after a change in dose, call your health care professional. Bonita Quin may get drowsy or dizzy. Do not drive, use machinery, or do anything that needs mental alertness until you know how this medicine affects you. Do not stand or sit up quickly, especially if you are an older patient. This reduces the risk of dizzy or fainting spells. Alcohol may interfere with the effect of this medicine. Avoid alcoholic drinks. This medicine contains a high percentage of alcohol that may interact with medicines used to treat alcohol abuse, like Antabuse (disulfiram). You should not take these medicines together. Your mouth may get dry. Chewing sugarless gum or sucking hard candy, and drinking plenty of water may help. Contact your doctor if the problem does not go away or is severe. What side effects may I notice from receiving this medicine? Side effects that you should report to your doctor or health care professional as soon as possible: -allergic reactions like skin rash, itching or hives, swelling of the face, lips, or tongue -black or bloody stools, blood in the urine or vomit -fast, irregular heartbeat -feeling faint or lightheaded, falls -hallucination, loss of contact with  reality -seizures -suicidal thoughts or other mood changes -unusual bleeding or bruising -unusually weak or tired -vomiting Side effects that usually do not require medical attention (report to your doctor or health care professional if they continue or are bothersome): -change in appetite -change in sex drive or performance -diarrhea -indigestion, nausea -increased sweating -tremors This list may not describe all possible side effects. Call your doctor for medical advice about side effects. You may report side effects to FDA at 1-800-FDA-1088. Where should I keep my medicine? Keep out of the reach of children. Store at room temperature between 15 and 30 degrees C (59 and 86 degrees F). Throw away any unused medicine after the expiration date. NOTE: This sheet is a summary. It may not cover all possible information. If you have questions about this medicine, talk to your doctor, pharmacist, or health care provider.  2013, [employer]. (01/09/2012 8:58:17 PM)

## 2012-11-09 ENCOUNTER — Other Ambulatory Visit: Payer: Medicaid Other

## 2012-11-09 ENCOUNTER — Ambulatory Visit: Payer: Medicaid Other | Admitting: Family Medicine

## 2012-11-09 ENCOUNTER — Encounter: Payer: Self-pay | Admitting: Family Medicine

## 2012-11-09 VITALS — BP 94/50 | Temp 99.3°F | Wt 202.0 lb

## 2012-11-09 DIAGNOSIS — Z331 Pregnant state, incidental: Secondary | ICD-10-CM

## 2012-11-09 DIAGNOSIS — F32A Depression, unspecified: Secondary | ICD-10-CM

## 2012-11-09 NOTE — Progress Notes (Signed)
[redacted]w[redacted]d S: Presented to office with c/o low-grade temp (99) x 1 day, nausea, and sadness. Inability to eat, but keeping liquids down. Patient denies SI/HI, but upset b/c of FOB leaving her.  She is taking Zoloft as prescribed and missed morning therapy session. O: Patient sitting on exam talking crying and looking down.  Discussed with patient at length the need to focus on having a healthy pregnancy, taking care of herself and other children.  Confirmed her feelings of being let down, but encouraged patient to remain positive and use family support system (her mom).  Discussed risk of going into PTL due to high stress.    T: 99.1 Lungs CTAB, Heart RRR no M/G/R. Skin warm and dry.        NST reactive (135 with 10x10 accels, moderate variability, contraction x 1-90 secs.)  A/P:  Pregnancy: ROB on Thursday for NST.         Depression: continue Zoloft 25 mg daily, discussed make take up to 2 -8 weeks to feel improvements and will taper accord to mood.  Need to reschedule           her therapy session. L.Carter, FNP-BC

## 2012-11-09 NOTE — Progress Notes (Signed)
[redacted]w[redacted]d Very depressed, no appetite, and low grade temp NST done today

## 2012-11-10 NOTE — Progress Notes (Signed)
Encouraged small, frequent meals.  Tylenol 650 mg for low-grade temp.  (q 4-6/PRN not to exceed 10 tabs/day).  Drink lots of fluids.  Call office if temp >100.4 or symptoms worsens. L.Carter, FNP-BC

## 2012-11-11 ENCOUNTER — Encounter: Payer: Self-pay | Admitting: Family Medicine

## 2012-11-11 ENCOUNTER — Inpatient Hospital Stay (HOSPITAL_COMMUNITY)
Admission: AD | Admit: 2012-11-11 | Discharge: 2012-11-11 | Disposition: A | Discharge status: Home / Self Care | Payer: Medicaid Other | Source: Ambulatory Visit | Attending: Obstetrics and Gynecology | Admitting: Obstetrics and Gynecology

## 2012-11-11 ENCOUNTER — Encounter (HOSPITAL_COMMUNITY): Payer: Self-pay | Admitting: *Deleted

## 2012-11-11 ENCOUNTER — Ambulatory Visit: Payer: Medicaid Other | Admitting: Family Medicine

## 2012-11-11 ENCOUNTER — Other Ambulatory Visit: Payer: Self-pay | Admitting: Obstetrics and Gynecology

## 2012-11-11 ENCOUNTER — Other Ambulatory Visit: Payer: Medicaid Other

## 2012-11-11 DIAGNOSIS — N898 Other specified noninflammatory disorders of vagina: Secondary | ICD-10-CM

## 2012-11-11 DIAGNOSIS — O99340 Other mental disorders complicating pregnancy, unspecified trimester: Secondary | ICD-10-CM | POA: Insufficient documentation

## 2012-11-11 DIAGNOSIS — N760 Acute vaginitis: Secondary | ICD-10-CM

## 2012-11-11 DIAGNOSIS — O4700 False labor before 37 completed weeks of gestation, unspecified trimester: Secondary | ICD-10-CM | POA: Insufficient documentation

## 2012-11-11 DIAGNOSIS — F319 Bipolar disorder, unspecified: Secondary | ICD-10-CM | POA: Diagnosis present

## 2012-11-11 DIAGNOSIS — O212 Late vomiting of pregnancy: Secondary | ICD-10-CM | POA: Insufficient documentation

## 2012-11-11 DIAGNOSIS — Z331 Pregnant state, incidental: Secondary | ICD-10-CM

## 2012-11-11 DIAGNOSIS — F3289 Other specified depressive episodes: Secondary | ICD-10-CM | POA: Insufficient documentation

## 2012-11-11 LAB — CBC
HCT: 31.9 % — ABNORMAL LOW (ref 36.0–46.0)
Hemoglobin: 10.9 g/dL — ABNORMAL LOW (ref 12.0–15.0)
MCH: 29.1 pg (ref 26.0–34.0)
MCV: 85.3 fL (ref 78.0–100.0)
Platelets: 237 10*3/uL (ref 150–400)
RBC: 3.74 MIL/uL — ABNORMAL LOW (ref 3.87–5.11)
WBC: 7.1 10*3/uL (ref 4.0–10.5)

## 2012-11-11 LAB — POCT WET PREP (WET MOUNT)
Clue Cells Wet Prep Whiff POC: POSITIVE
Trichomonas Wet Prep HPF POC: NEGATIVE
pH: 5

## 2012-11-11 LAB — COMPREHENSIVE METABOLIC PANEL
AST: 14 U/L (ref 0–37)
CO2: 20 mEq/L (ref 19–32)
Calcium: 8.9 mg/dL (ref 8.4–10.5)
Chloride: 102 mEq/L (ref 96–112)
Creatinine, Ser: 0.7 mg/dL (ref 0.50–1.10)
GFR calc Af Amer: >90 mL/min (ref >90)
GFR calc non Af Amer: >90 mL/min (ref >90)
Glucose, Bld: 86 mg/dL (ref 70–99)
Total Bilirubin: 0.4 mg/dL (ref 0.3–1.2)

## 2012-11-11 LAB — FETAL FIBRONECTIN: Fetal Fibronectin: POSITIVE — AB

## 2012-11-11 MED ORDER — ONDANSETRON HCL 4 MG/2ML IJ SOLN: 4.0000 mg | Freq: Once | INTRAMUSCULAR | Status: DC

## 2012-11-11 MED ORDER — LACTATED RINGERS IV BOLUS (SEPSIS)
1000.0000 mL | Freq: Once | INTRAVENOUS | Status: AC
  Administered 2012-11-11: 1000 mL via INTRAVENOUS

## 2012-11-11 MED ORDER — ONDANSETRON HCL 4 MG/2ML IJ SOLN
4.0000 mg | Freq: Once | INTRAMUSCULAR | Status: AC
  Administered 2012-11-11: 4 mg via INTRAVENOUS
  Filled 2012-11-11: qty 2

## 2012-11-11 MED ORDER — SERTRALINE HCL 25 MG PO TABS: 50.0000 mg | ORAL_TABLET | Freq: Every day | ORAL | Status: DC

## 2012-11-11 MED ORDER — LACTATED RINGERS IV SOLN
INTRAVENOUS | Status: DC
  Administered 2012-11-11: 15:00:00 via INTRAVENOUS

## 2012-11-11 MED ORDER — ONDANSETRON HCL 4 MG PO TABS: 4.0000 mg | ORAL_TABLET | Freq: Three times a day (TID) | ORAL | Status: DC | PRN

## 2012-11-11 NOTE — Progress Notes (Signed)
S: Vomiting x 24 hours.  Vomiting in office.  Unable to keep liquids down.  Sharp pains in lower abdomen x 2 days, intermittently with thick white discharge.  Denies itching/burning/VB/LOF.  Denies dysuria, denies intercourse x 24 hours.  Hasn't taking anything for nausea/pain.  Feels better and taking Zoloft, didn't rescheduled therapy visit (don't know the name of the facility). Denies SI/HI.  O: Patient making eye contact today, sitting on exam table in mild distress.   Normal hair distrubtion, External genital appropriately colored with large amount of malodorus thin, white discharge, left enlarge bartholin cyst and skene normal size without enlargement.  Parous cervix with no lestions, discharge or CMT.  Thin, white discharge in vault.  Gravida uterus.  No tenderness or masses to adnexal region.   A: Wet mount: negative for trich/yeast/+clues     UA: negative     NST: reactive (135, moderate variability, with 10x10 accels, contractions q 2-6 minutes 60-90 seconds, mild to palpation). P: Will treat BV with metrogel as ordered.      Talked with SL will admitted to MAU for fluids/IV antiemetics.       RTO for Bi-weekly NST/ ROB in 1 week. L.Carter, FNP-BC

## 2012-11-11 NOTE — MAU Provider Note (Signed)
History   27 yo [id number] at 43 3/7 weeks presented from office for IV hydration and monitoring due to N/V x 24 hours, inability to keep f/f down last 24 hour.  Had mild UCs on NST today (has 2x/week NSTs due to umbilical cord cyst).  Cervix was L/C, FFN and cultures done.  +BV on wet prep, Rx'd with Metrogel.  Also has hx of depression, with new start of Zoloft 25 mg po on 11/05/12.  Reports she "may need to be admitted for depression".  Some episodes of SI, but no plan in place.  Feels very depressed and hopeless.  Denies HI.   Patient Active Problem List  Diagnosis  . Threatened premature labor, antepartum  . Acid reflux  . Gastritis  . BV (bacterial vaginosis)  . Short interval between pregnancies complicating pregnancy, antepartum  . Vitamin D deficiency  . Hemoglobin A-S genotype  . Anemia  . Abnormal umbilical cord--cord cyst  . Pyelectasis of fetus on prenatal ultrasound  . Bipolar disorder, unspecified/depression     Chief Complaint  Patient presents with  . Hyperemesis Gravidarum    OB History   Grav Para Term Preterm Abortions TAB SAB Ect Mult Living   4 3 3  0 0 0 0 0 0 3      Past Medical History  Diagnosis Date  . Sickle cell trait   . Mental disorder   . Bipolar affective disorder   . Bipolar affective disorder 2006    TOOK MEDS X 3 YR  . Depression     PP 2013  . Abnormal Pap smear     LAST PAP 06/2011  . Asthma     [facility]; [facility]  . Hepatitis 2011    HEPATITIS B ????/  . Infection     UTI  . Anemia     CHRONIC  . Headache     SEVERAL TIMES/DAY    Past Surgical History  Procedure Laterality Date  . Mouth surgery    . No past surgeries    . Wisdom tooth extraction  2012    Family History  Problem Relation Age of Onset  . Hypertension Mother   . Asthma Mother   . Heart disease Mother   . Hyperlipidemia Mother   . Thyroid disease Mother   . Alcohol abuse Father   . Asthma Sister   . Depression Sister   . Diabetes Maternal Aunt    . HIV Maternal Aunt   . Cancer Maternal Grandmother 37    COLON  . Depression Maternal Grandmother   . Alcohol abuse Maternal Grandmother   . Drug abuse Maternal Grandmother   . Alcohol abuse Maternal Grandfather   . Drug abuse Maternal Grandfather   . Stroke Maternal Grandfather   . Alcohol abuse Paternal Grandfather   . Drug abuse Paternal Grandfather   . Asthma Sister     History  Substance Use Topics  . Smoking status: Never Smoker   . Smokeless tobacco: Never Used  . Alcohol Use: No    Allergies: No Known Allergies  Prescriptions prior to admission  Medication Sig Dispense Refill  . sertraline (ZOLOFT) 25 MG tablet Take 25 mg by mouth at bedtime.      . [DISCONTINUED] sertraline (ZOLOFT) 25 MG tablet Take 1 tablet (25 mg total) by mouth daily.  30 tablet  0     Physical Exam   Blood pressure 112/61, pulse 77, temperature 98.5 F (36.9 C), temperature source Oral, resp. rate 20, weight  199 lb (90.266 kg), last menstrual period 04/11/2012.  Depressed affect, but in no apparent physical discomfort. Chest clear Heart RRR without murmur Abd gravid, NT Pelvic--closed, long at office Ext WNL  FHR Category 1 UCs--sporadic, mild, occasional irritability    ED Course  IUP at 32 3/7 weeks N/V Worsening depression  Plan: IV hydration Zofran CBC CMP Reviewed issues with patient--will have ACT team see patient in MAU to assess for severity/acuity of depression. No current active SI--no clear need to implement suicide precautions at present, but needs more complete assessment today of status by mental health professional. ACT team will be contacted by nursing staff.   Nigel Bridgeman CNM, [place] 11/11/2012 1:57 PM  Addendum: Just spoke with ACT team member, Duke Salvia will come to see patient.  Patient may not meet criteria for inpatient admission, but I requested she come evaluate the patient's mental status more fully than I.  Requested she determine what  therapeutic care arrangements and medication/dose would be most appropriate for the patient.  Patient feeling less nauseated now--has taken po fluids. Willing to try soup and crackers--ordered.  FHR Category 1 Very occasional contractions.  FFN still pending.  Will await ACT team evaluation.  Nigel Bridgeman, CNM 11/11/12 3:45p

## 2012-11-11 NOTE — MAU Note (Signed)
ACT team has given pt numerous resources, pt verbalizes understanding of all instructions.

## 2012-11-11 NOTE — MAU Note (Signed)
Intake person contacted for 2nd time regarding ACT team coming to see pt.  States someone will be calling us.
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
## 2012-11-11 NOTE — MAU Note (Signed)
Patient sent from the office for IV fluids. Patient states she is having nausea and vomiting and contractions.

## 2012-11-11 NOTE — MAU Note (Signed)
ACT team called to say they are on the way to see pt.

## 2012-11-11 NOTE — MAU Provider Note (Signed)
Seen by Act Team--admission not needed.  See those notes for further details of that assessment. Multiple social issues (FOB has left her, took her car), coupled with hx of depression/bipolar.  Patient feeling better--still has some nause, but tolerated soup and crackers, ginger ale.  Results for orders placed during the hospital encounter of 11/11/12 (from the past 24 hour(s))  CBC     Status: Abnormal   Collection Time    11/11/12  1:35 PM      Result Value Range   WBC 7.1  4.0 - 10.5 K/uL   RBC 3.74 (*) 3.87 - 5.11 MIL/uL   Hemoglobin 10.9 (*) 12.0 - 15.0 g/dL   HCT 60.4 (*) 54.0 - 98.1 %   MCV 85.3  78.0 - 100.0 fL   MCH 29.1  26.0 - 34.0 pg   MCHC 34.2  30.0 - 36.0 g/dL   RDW 19.1  47.8 - 29.5 %   Platelets 237  150 - 400 K/uL  COMPREHENSIVE METABOLIC PANEL     Status: Abnormal   Collection Time    11/11/12  1:35 PM      Result Value Range   Sodium 134 (*) 135 - 145 mEq/L   Potassium 3.5  3.5 - 5.1 mEq/L   Chloride 102  96 - 112 mEq/L   CO2 20  19 - 32 mEq/L   Glucose, Bld 86  70 - 99 mg/dL   BUN 4 (*) 6 - 23 mg/dL   Creatinine, Ser 6.21  0.50 - 1.10 mg/dL   Calcium 8.9  8.4 - 30.8 mg/dL   Total Protein 6.6  6.0 - 8.3 g/dL   Albumin 2.8 (*) 3.5 - 5.2 g/dL   AST 14  0 - 37 U/L   ALT 7  0 - 35 U/L   Alkaline Phosphatase 119 (*) 39 - 117 U/L   Total Bilirubin 0.4  0.3 - 1.2 mg/dL   GFR calc non Af Amer >90  >90 mL/min   GFR calc Af Amer >90  >90 mL/min   FFN still pending--I called [facility] lab.  No results yet--lab working to locate specimen.  They will continue to  Investigate, will f/u with CNM on call when issue resolved.  Plan: D/C home with resource information from Act Team on outpatient resources for therapy. I will increase Zoloft to maintenance dose of 50 mg daily (new Rx sent to pharmacy). Rx Zofran 4 mg po TID prn nausea. To call with any worsening of sx. Keep appt at [facility] on Monday, or call prn.  Nigel Bridgeman, CNM 11/11/12 5:10p

## 2012-11-11 NOTE — MAU Note (Signed)
Manfred Arch CNM & ACT team in to talk with pt.

## 2012-11-11 NOTE — MAU Note (Signed)
ACT team called, pt information given, team member to contact CNM regarding pt status.

## 2012-11-11 NOTE — Progress Notes (Signed)
[redacted]w[redacted]d, NST today. Pt is c/o frequent vomitting. Pt is only taking Zolft. No PNV's. Melody Comas A. Pt also c/o low sharp pains occ.

## 2012-11-11 NOTE — MAU Note (Signed)
Sent for office for IV fluids,

## 2012-11-11 NOTE — MAU Note (Signed)
When asking pt about psych questions, pt stated she is being treated for depression & has thoughts of harming herself at times but not today.  Pt asked what would be done for someone if they were having thoughts of suicide - explained procedure to pt, ACT team, sitter, removing items from room, etc.  Questioned pt again about thoughts of suicide today, which she denies.  Informed pt our SW could come talk to her about resources, pt is interested.  CNM informed - wants ACT team to see pt.

## 2012-11-12 ENCOUNTER — Other Ambulatory Visit: Payer: Self-pay | Admitting: Family Medicine

## 2012-11-12 LAB — GC/CHLAMYDIA PROBE AMP
CT Probe RNA: NEGATIVE
GC Probe RNA: NEGATIVE

## 2012-11-13 ENCOUNTER — Encounter (HOSPITAL_COMMUNITY): Payer: Self-pay

## 2012-11-13 ENCOUNTER — Inpatient Hospital Stay (HOSPITAL_COMMUNITY)
Admission: AD | Admit: 2012-11-13 | Discharge: 2012-11-13 | Disposition: A | Discharge status: Home / Self Care | Payer: Medicaid Other | Source: Ambulatory Visit | Attending: Obstetrics and Gynecology | Admitting: Obstetrics and Gynecology

## 2012-11-13 DIAGNOSIS — R109 Unspecified abdominal pain: Secondary | ICD-10-CM | POA: Insufficient documentation

## 2012-11-13 DIAGNOSIS — O4703 False labor before 37 completed weeks of gestation, third trimester: Secondary | ICD-10-CM

## 2012-11-13 DIAGNOSIS — O4700 False labor before 37 completed weeks of gestation, unspecified trimester: Secondary | ICD-10-CM | POA: Insufficient documentation

## 2012-11-13 LAB — URINALYSIS, ROUTINE W REFLEX MICROSCOPIC
Bilirubin Urine: NEGATIVE
Hgb urine dipstick: NEGATIVE
Nitrite: NEGATIVE
Protein, ur: NEGATIVE mg/dL
Specific Gravity, Urine: <1.005 — ABNORMAL LOW (ref 1.005–1.030)
Urobilinogen, UA: 1 mg/dL (ref 0.0–1.0)

## 2012-11-13 MED ORDER — BETAMETHASONE SOD PHOS & ACET 6 (3-3) MG/ML IJ SUSP
12.0000 mg | INTRAMUSCULAR | Status: DC
  Administered 2012-11-13: 12 mg via INTRAMUSCULAR
  Filled 2012-11-13: qty 2

## 2012-11-13 NOTE — MAU Provider Note (Signed)
History    CSN: [id number]  Arrival date and time: 11/13/12 2009  Chief Complaint  Patient presents with  . Abdominal Pain   HPI Pt is a 27yo G4 P3,0,0,3 who presents at 32w 5d with c/o of abdominal pain and cramping throughout the day today.  Pt reports active fetus.  Denies ROM or bldg.  States she is unsure of the frequency of the cramping.  Pt was seen in MAU on 11/11/12 due to nausea and vomiting and pt states that issue has resolved.  Pt also had UCs on NST (being done due to umbilical cord cyst) and was sent to MAU for eval and tx with IVF as well as monitoring.  FFN was collected at her office visit prior to SVE.  Pt was placed on Metrogel as well due to BV.  The FFN result was not available prior to discharge from MAU.  The FFN returned with a positive result.  BMZ had been ordered but pt states she was unaware of the result and the plan for BMZ prior to her visit today.  SVE at time of last visit was closed/long.  She reports her depression symptoms have improved at the present time since her last visit.  She does not have any previous history of preterm labor or preterm delivery.  She has had increased social issues during this pregnancy with increased stress.   OB History   Grav Para Term Preterm Abortions TAB SAB Ect Mult Living   4 3 3  0 0 0 0 0 0 3      Past Medical History  Diagnosis Date  . Sickle cell trait   . Mental disorder   . Bipolar affective disorder   . Bipolar affective disorder 2006    TOOK MEDS X 3 YR  . Depression     PP 2013  . Abnormal Pap smear     LAST PAP 06/2011  . Asthma     [facility]; [facility]  . Hepatitis 2011    HEPATITIS B ????/  . Infection     UTI  . Anemia     CHRONIC  . Headache     SEVERAL TIMES/DAY    Past Surgical History  Procedure Laterality Date  . Mouth surgery    . No past surgeries    . Wisdom tooth extraction  2012    Family History  Problem Relation Age of Onset  . Hypertension Mother   . Asthma Mother   .  Heart disease Mother   . Hyperlipidemia Mother   . Thyroid disease Mother   . Alcohol abuse Father   . Asthma Sister   . Depression Sister   . Diabetes Maternal Aunt   . HIV Maternal Aunt   . Cancer Maternal Grandmother 37    COLON  . Depression Maternal Grandmother   . Alcohol abuse Maternal Grandmother   . Drug abuse Maternal Grandmother   . Alcohol abuse Maternal Grandfather   . Drug abuse Maternal Grandfather   . Stroke Maternal Grandfather   . Alcohol abuse Paternal Grandfather   . Drug abuse Paternal Grandfather   . Asthma Sister     History  Substance Use Topics  . Smoking status: Never Smoker   . Smokeless tobacco: Never Used  . Alcohol Use: No    Allergies: No Known Allergies  Prescriptions prior to admission  Medication Sig Dispense Refill  . ondansetron (ZOFRAN) 4 MG tablet Take 1 tablet (4 mg total) by mouth every 8 (eight)  hours as needed for nausea.  20 tablet  0  . sertraline (ZOLOFT) 25 MG tablet Take 2 tablets (50 mg total) by mouth daily.  60 tablet  2    Review of Systems  Constitutional: Negative.   HENT:       Contd intermittent stress headaches which are mild and unchanged.  Eyes: Negative.   Respiratory: Negative.   Cardiovascular: Negative.   Gastrointestinal: Negative.   Genitourinary: Positive for dysuria.       Noted slight dysuria this AM x 1.    Musculoskeletal: Negative.   Skin: Negative.   Neurological: Positive for headaches.  Endo/Heme/Allergies: Negative.   Psychiatric/Behavioral: Negative.    Physical Exam   Blood pressure 106/61, pulse 69, temperature 98.3 F (36.8 C), resp. rate 20, height 5' 1.5" (1.562 m), weight 203 lb 12.8 oz (92.443 kg), last menstrual period 04/11/2012, SpO2 100.00%.  Physical Exam  Constitutional: She is oriented to person, place, and time. She appears well-developed and well-nourished.  HENT:  Head: Normocephalic and atraumatic.  Right Ear: External ear normal.  Left Ear: External ear normal.   Nose: Nose normal.  Eyes: Conjunctivae are normal. Pupils are equal, round, and reactive to light.  Neck: Normal range of motion. Neck supple.  Cardiovascular: Normal rate, regular rhythm and intact distal pulses.   Respiratory: Effort normal and breath sounds normal.  GI: Soft. Bowel sounds are normal. She exhibits no distension. There is no tenderness. There is no rebound and no guarding.  Genitourinary: Vagina normal and uterus normal.  SVE: Closed/thick/-2/vtx  Musculoskeletal: Normal range of motion.  Neurological: She is alert and oriented to person, place, and time. She has normal reflexes.  Skin: Skin is warm and dry.  Psychiatric: She has a normal mood and affect. Her behavior is normal.   FHR baseline: 135 bpm Variability: Moderate Accels:  Present Decels: Absent Toco: uterine irritability present with rare UC noted.  Mild to palpation.  UC x 3-4/hr noted x 2hrs of monitoring.  MAU Course  Procedures U/A Results for orders placed during the hospital encounter of 11/13/12 (from the past 24 hour(s))  URINALYSIS, ROUTINE W REFLEX MICROSCOPIC     Status: Abnormal   Collection Time    11/13/12  9:36 PM      Result Value Range   Color, Urine YELLOW  YELLOW   APPearance CLEAR  CLEAR   Specific Gravity, Urine <1.005 (*) 1.005 - 1.030   pH 6.5  5.0 - 8.0   Glucose, UA NEGATIVE  NEGATIVE mg/dL   Hgb urine dipstick NEGATIVE  NEGATIVE   Bilirubin Urine NEGATIVE  NEGATIVE   Ketones, ur NEGATIVE  NEGATIVE mg/dL   Protein, ur NEGATIVE  NEGATIVE mg/dL   Urobilinogen, UA 1.0  0.0 - 1.0 mg/dL   Nitrite NEGATIVE  NEGATIVE   Leukocytes, UA NEGATIVE  NEGATIVE   Betamethasone 12mg  IM given (1st dose)  Assessment and Plan  IUP at 32w 5d Positive FFN Preterm contractions  Pt to return 11/14/12 for 2nd dose of BMZ 12mg  IM. Revd s/s preterm labor. Rec pelvic rest at present and rest when able as well as increase po fluids. Complete Metrogel as previously rxed.    RTO as sched on  Monday, 11/15/12.    SMITH,NONA O. 11/13/2012, 9:41 PM

## 2012-11-13 NOTE — MAU Note (Signed)
I had vomiting from Tues to Thurs. And had pain in upper and lower stomach during that time. Came to MAU and had IV flds and felt better. Today having upper and lower stomach pain again. Threw up one time today. No diarrhea

## 2012-11-14 ENCOUNTER — Inpatient Hospital Stay (HOSPITAL_COMMUNITY)
Admission: AD | Admit: 2012-11-14 | Discharge: 2012-11-14 | Disposition: A | Discharge status: Home / Self Care | Payer: Medicaid Other | Source: Ambulatory Visit | Attending: Obstetrics and Gynecology | Admitting: Obstetrics and Gynecology

## 2012-11-14 DIAGNOSIS — O358XX0 Maternal care for other (suspected) fetal abnormality and damage, not applicable or unspecified: Secondary | ICD-10-CM

## 2012-11-14 DIAGNOSIS — O289 Unspecified abnormal findings on antenatal screening of mother: Secondary | ICD-10-CM | POA: Insufficient documentation

## 2012-11-14 DIAGNOSIS — O4700 False labor before 37 completed weeks of gestation, unspecified trimester: Secondary | ICD-10-CM | POA: Insufficient documentation

## 2012-11-14 DIAGNOSIS — F319 Bipolar disorder, unspecified: Secondary | ICD-10-CM

## 2012-11-14 MED ORDER — BETAMETHASONE SOD PHOS & ACET 6 (3-3) MG/ML IJ SUSP
12.0000 mg | Freq: Once | INTRAMUSCULAR | Status: AC
  Administered 2012-11-14: 12 mg via INTRAMUSCULAR
  Filled 2012-11-14: qty 2

## 2012-11-14 NOTE — MAU Note (Signed)
Pt reports she is here for repeat BMZ injection. Denies pain, bleeding or other complaints. Reports good fetal movement.

## 2012-11-15 ENCOUNTER — Ambulatory Visit: Payer: Medicaid Other | Admitting: Family Medicine

## 2012-11-15 ENCOUNTER — Ambulatory Visit: Payer: Medicaid Other

## 2012-11-15 ENCOUNTER — Other Ambulatory Visit: Payer: Self-pay | Admitting: Family Medicine

## 2012-11-15 ENCOUNTER — Telehealth: Payer: Self-pay

## 2012-11-15 DIAGNOSIS — O358XX0 Maternal care for other (suspected) fetal abnormality and damage, not applicable or unspecified: Secondary | ICD-10-CM

## 2012-11-15 DIAGNOSIS — R112 Nausea with vomiting, unspecified: Secondary | ICD-10-CM

## 2012-11-15 DIAGNOSIS — O288 Other abnormal findings on antenatal screening of mother: Secondary | ICD-10-CM

## 2012-11-15 MED ORDER — PROMETHAZINE HCL 25 MG RE SUPP: 25.0000 mg | Freq: Four times a day (QID) | RECTAL | Status: DC | PRN

## 2012-11-15 NOTE — Patient Instructions (Addendum)
Nausea and Vomiting Nausea is a sick feeling that often comes before throwing up (vomiting). Vomiting is a reflex where stomach contents come out of your mouth. Vomiting can cause severe loss of body fluids (dehydration). Children and elderly adults can become dehydrated quickly, especially if they also have diarrhea. Nausea and vomiting are symptoms of a condition or disease. It is important to find the cause of your symptoms. CAUSES   Direct irritation of the stomach lining. This irritation can result from increased acid production (gastroesophageal reflux disease), infection, food poisoning, taking certain medicines (such as nonsteroidal anti-inflammatory drugs), alcohol use, or tobacco use.  Signals from the brain.These signals could be caused by a headache, heat exposure, an inner ear disturbance, increased pressure in the brain from injury, infection, a tumor, or a concussion, pain, emotional stimulus, or metabolic problems.  An obstruction in the gastrointestinal tract (bowel obstruction).  Illnesses such as diabetes, hepatitis, gallbladder problems, appendicitis, kidney problems, cancer, sepsis, atypical symptoms of a heart attack, or eating disorders.  Medical treatments such as chemotherapy and radiation.  Receiving medicine that makes you sleep (general anesthetic) during surgery. DIAGNOSIS Your caregiver may ask for tests to be done if the problems do not improve after a few days. Tests may also be done if symptoms are severe or if the reason for the nausea and vomiting is not clear. Tests may include:  Urine tests.  Blood tests.  Stool tests.  Cultures (to look for evidence of infection).  X-rays or other imaging studies. Test results can help your caregiver make decisions about treatment or the need for additional tests. TREATMENT You need to stay well hydrated. Drink frequently but in small amounts.You may wish to drink water, sports drinks, clear broth, or eat frozen  ice pops or gelatin dessert to help stay hydrated.When you eat, eating slowly may help prevent nausea.There are also some antinausea medicines that may help prevent nausea. HOME CARE INSTRUCTIONS   Take all medicine as directed by your caregiver.  If you do not have an appetite, do not force yourself to eat. However, you must continue to drink fluids.  If you have an appetite, eat a normal diet unless your caregiver tells you differently.  Eat a variety of complex carbohydrates (rice, wheat, potatoes, bread), lean meats, yogurt, fruits, and vegetables.  Avoid high-fat foods because they are more difficult to digest.  Drink enough water and fluids to keep your urine clear or pale yellow.  If you are dehydrated, ask your caregiver for specific rehydration instructions. Signs of dehydration may include:  Severe thirst.  Dry lips and mouth.  Dizziness.  Dark urine.  Decreasing urine frequency and amount.  Confusion.  Rapid breathing or pulse. SEEK IMMEDIATE MEDICAL CARE IF:   You have blood or brown flecks (like coffee grounds) in your vomit.  You have black or bloody stools.  You have a severe headache or stiff neck.  You are confused.  You have severe abdominal pain.  You have chest pain or trouble breathing.  You do not urinate at least once every 8 hours.  You develop cold or clammy skin.  You continue to vomit for longer than 24 to 48 hours.  You have a fever. MAKE SURE YOU:   Understand these instructions.  Will watch your condition.  Will get help right away if you are not doing well or get worse. Document Released: 08/25/2005 Document Revised: 11/17/2011 Document Reviewed: 01/22/2011 [facility] Patient Information 2013 [place], [place].  B.R.A.T. Diet Your doctor  has recommended the B.R.A.T. diet for you or your child until the condition improves. This is often used to help control diarrhea and vomiting symptoms. If you or your child can tolerate clear  liquids, you may have:  Bananas.   Rice.   Applesauce.   Toast (and other simple starches such as crackers, potatoes, noodles).  Be sure to avoid dairy products, meats, and fatty foods until symptoms are better. Fruit juices such as apple, grape, and prune juice can make diarrhea worse. Avoid these. Continue this diet for 2 days or as instructed by your caregiver. Document Released: 08/25/2005 Document Revised: 08/14/2011 Document Reviewed: 02/11/2007 [facility] Patient Information 2012 [place], [place].

## 2012-11-15 NOTE — Telephone Encounter (Signed)
Message copied by Darien Ramus on Mon Nov 15, 2012  8:17 AM ------      Message from: Elane Fritz      Created: Fri Nov 12, 2012  2:26 PM       Please call this patient, she needs to go to MAU for Betamethasone. L.Carter, FNP-BC ------

## 2012-11-15 NOTE — Telephone Encounter (Signed)
Pt was seen for injection at MAU on Saturday 03/08 and Sunday 03/09.   Darien Ramus, CMA

## 2012-11-15 NOTE — Progress Notes (Signed)
[redacted]w[redacted]d  NST only

## 2012-11-15 NOTE — Progress Notes (Signed)
[redacted]w[redacted]d Patient with lots of nausea and decreased appetite. PO challenge in the office with water and juice, pt tolerated without difficulty.  Would like to go home and try BRAT diet and ginger ale with rectal phenergan 25 mg q 6 hours and alternate with zofran.  Declines hospital admission. Doing better with zoloft, FOB still out the picture, patient is engaged in conversation and smiling today.  Denies SI/HI. NST: FHR 130 with 15x15 accels, minimal to moderate variability, no contractions. POC consult with Dr. Normand Sloop. BPP: 8/8 in 16 minutes. ROB as scheduled for NST on Thursday. L.Carter, FNP-BC

## 2012-11-18 ENCOUNTER — Ambulatory Visit: Payer: Medicaid Other | Admitting: Family Medicine

## 2012-11-18 ENCOUNTER — Ambulatory Visit: Payer: Medicaid Other

## 2012-11-18 DIAGNOSIS — Z331 Pregnant state, incidental: Secondary | ICD-10-CM

## 2012-11-18 NOTE — Progress Notes (Unsigned)
Pt is here today for NST first and ROB with LC. Pt stated no issues today.

## 2012-11-18 NOTE — Progress Notes (Signed)
[redacted]w[redacted]d Doing well.  Good fetal movement. NST: reactive today 135 with moderate variability and 10 x 10 accels, toco: contraction x 1. Feels better, FOB helping with kids.  Still taking Zoloft as prescribed, no therapy.  Denies SI/HI. Patient is sitting on exam table smiling and laughing, has on appropriate clothes and hair done (no pjs). F/U Monday and Thursday for NST next week. L.Carter, FNP-BC

## 2012-11-22 ENCOUNTER — Ambulatory Visit: Payer: Medicaid Other

## 2012-11-22 ENCOUNTER — Ambulatory Visit (HOSPITAL_COMMUNITY)
Admission: RE | Admit: 2012-11-22 | Discharge: 2012-11-22 | Disposition: A | Discharge status: Home / Self Care | Payer: Medicaid Other | Source: Ambulatory Visit | Attending: Obstetrics and Gynecology | Admitting: Obstetrics and Gynecology

## 2012-11-22 ENCOUNTER — Encounter (HOSPITAL_COMMUNITY): Payer: Self-pay

## 2012-11-22 VITALS — BP 100/52 | Wt 200.0 lb

## 2012-11-22 DIAGNOSIS — O358XX0 Maternal care for other (suspected) fetal abnormality and damage, not applicable or unspecified: Secondary | ICD-10-CM

## 2012-11-22 DIAGNOSIS — O288 Other abnormal findings on antenatal screening of mother: Secondary | ICD-10-CM

## 2012-11-22 DIAGNOSIS — F319 Bipolar disorder, unspecified: Secondary | ICD-10-CM

## 2012-11-22 DIAGNOSIS — Z3689 Encounter for other specified antenatal screening: Secondary | ICD-10-CM | POA: Insufficient documentation

## 2012-11-22 DIAGNOSIS — D649 Anemia, unspecified: Secondary | ICD-10-CM

## 2012-11-22 DIAGNOSIS — O289 Unspecified abnormal findings on antenatal screening of mother: Secondary | ICD-10-CM | POA: Insufficient documentation

## 2012-11-22 NOTE — Progress Notes (Signed)
Pt sent from the office with a non-reactive but reassuring NST for a f/u BPP.  Biweekly NSTs done d/t an umbilical cord cyst.  BPP 8/8.  Pt sent home to keep next scheduled appointment.

## 2012-11-22 NOTE — Progress Notes (Unsigned)
Pt is here for NST only. 

## 2012-11-23 ENCOUNTER — Encounter (HOSPITAL_COMMUNITY): Payer: Self-pay | Admitting: *Deleted

## 2012-11-23 ENCOUNTER — Inpatient Hospital Stay (HOSPITAL_COMMUNITY)
Admission: AD | Admit: 2012-11-23 | Discharge: 2012-11-23 | Disposition: A | Discharge status: Home / Self Care | Payer: Medicaid Other | Source: Ambulatory Visit | Attending: Obstetrics and Gynecology | Admitting: Obstetrics and Gynecology

## 2012-11-23 DIAGNOSIS — D649 Anemia, unspecified: Secondary | ICD-10-CM

## 2012-11-23 DIAGNOSIS — F319 Bipolar disorder, unspecified: Secondary | ICD-10-CM

## 2012-11-23 DIAGNOSIS — M545 Low back pain, unspecified: Secondary | ICD-10-CM | POA: Insufficient documentation

## 2012-11-23 DIAGNOSIS — R109 Unspecified abdominal pain: Secondary | ICD-10-CM | POA: Insufficient documentation

## 2012-11-23 DIAGNOSIS — Y92009 Unspecified place in unspecified non-institutional (private) residence as the place of occurrence of the external cause: Secondary | ICD-10-CM | POA: Insufficient documentation

## 2012-11-23 DIAGNOSIS — W010XXA Fall on same level from slipping, tripping and stumbling without subsequent striking against object, initial encounter: Secondary | ICD-10-CM | POA: Insufficient documentation

## 2012-11-23 DIAGNOSIS — O358XX0 Maternal care for other (suspected) fetal abnormality and damage, not applicable or unspecified: Secondary | ICD-10-CM

## 2012-11-23 DIAGNOSIS — Y9301 Activity, walking, marching and hiking: Secondary | ICD-10-CM | POA: Insufficient documentation

## 2012-11-23 DIAGNOSIS — O368190 Decreased fetal movements, unspecified trimester, not applicable or unspecified: Secondary | ICD-10-CM | POA: Insufficient documentation

## 2012-11-23 MED ORDER — HYDROCODONE-ACETAMINOPHEN 5-325 MG PO TABS
1.0000 | ORAL_TABLET | ORAL | Status: DC | PRN
  Administered 2012-11-23: 1 via ORAL
  Filled 2012-11-23 (×2): qty 1

## 2012-11-23 MED ORDER — HYDROCODONE-ACETAMINOPHEN 5-325 MG PO TABS: 1.0000 | ORAL_TABLET | ORAL | Status: DC | PRN

## 2012-11-23 NOTE — MAU Note (Signed)
Neighbors dogs jumped the fence, she went to the stairs to get away and slipped on the ice. Landed on right side.  Hurting on right side and low back, getting worse.. No movement since fall.

## 2012-11-24 ENCOUNTER — Other Ambulatory Visit: Payer: Self-pay | Admitting: Obstetrics and Gynecology

## 2012-11-25 ENCOUNTER — Ambulatory Visit (HOSPITAL_COMMUNITY)
Admission: RE | Admit: 2012-11-25 | Discharge: 2012-11-25 | Disposition: A | Discharge status: Home / Self Care | Payer: Medicaid Other | Source: Ambulatory Visit | Attending: Obstetrics and Gynecology | Admitting: Obstetrics and Gynecology

## 2012-11-25 DIAGNOSIS — O358XX0 Maternal care for other (suspected) fetal abnormality and damage, not applicable or unspecified: Secondary | ICD-10-CM | POA: Insufficient documentation

## 2012-12-07 ENCOUNTER — Other Ambulatory Visit: Payer: Self-pay

## 2012-12-09 ENCOUNTER — Encounter (HOSPITAL_COMMUNITY): Payer: Self-pay

## 2012-12-09 ENCOUNTER — Inpatient Hospital Stay (HOSPITAL_COMMUNITY)
Admission: AD | Admit: 2012-12-09 | Discharge: 2012-12-09 | Disposition: A | Discharge status: Home / Self Care | Payer: Medicaid Other | Source: Ambulatory Visit | Attending: Obstetrics and Gynecology | Admitting: Obstetrics and Gynecology

## 2012-12-09 DIAGNOSIS — D649 Anemia, unspecified: Secondary | ICD-10-CM

## 2012-12-09 DIAGNOSIS — O358XX0 Maternal care for other (suspected) fetal abnormality and damage, not applicable or unspecified: Secondary | ICD-10-CM

## 2012-12-09 DIAGNOSIS — O4703 False labor before 37 completed weeks of gestation, third trimester: Secondary | ICD-10-CM

## 2012-12-09 DIAGNOSIS — O4700 False labor before 37 completed weeks of gestation, unspecified trimester: Secondary | ICD-10-CM | POA: Insufficient documentation

## 2012-12-09 DIAGNOSIS — F319 Bipolar disorder, unspecified: Secondary | ICD-10-CM

## 2012-12-09 NOTE — MAU Note (Signed)
Patient states she is having contractions every 2-7 minutes. Denies bleeding or leaking and reports that the baby does not move much. States this is normal for her baby.

## 2012-12-09 NOTE — MAU Note (Signed)
JOxley, CNM returned call, notified pt in MAU for eval of contractions q2 minutes apart. Pt was 3.5 cm Tuesday. RN to check pt's cervix and let pt know.

## 2012-12-09 NOTE — MAU Note (Signed)
Pt states ctx's began around 0730 this am. Lindsay Ramirez to office today for eval of ctx's. Has been 3.5 cm in office prior to today.

## 2012-12-09 NOTE — MAU Provider Note (Signed)
  History  26yo [id number] at [redacted]w[redacted]d presents with c/o UCs 2-7 min apart starting at 0730 this am.  Denies VB, LOF, HA, or visual changes.  While in MAU pt reports UCs decreased in intensity and frequency.  CSN: [id number]  Arrival date and time: 12/09/12 1619   None     Chief Complaint  Patient presents with  . Labor Eval   HPI  OB History   Grav Para Term Preterm Abortions TAB SAB Ect Mult Living   4 3 3  0 0 0 0 0 0 3      Past Medical History  Diagnosis Date  . Sickle cell trait   . Mental disorder   . Bipolar affective disorder   . Bipolar affective disorder 2006    TOOK MEDS X 3 YR  . Depression     PP 2013  . Abnormal Pap smear     LAST PAP 06/2011  . Asthma     [facility]; [facility]  . Infection     UTI  . Anemia     CHRONIC  . Headache     SEVERAL TIMES/DAY  . Hepatitis 2011    HEPATITIS B ????/States negative 11/23/12    Past Surgical History  Procedure Laterality Date  . Mouth surgery    . No past surgeries    . Wisdom tooth extraction  2012  . Vaginal delivery      X 3    Family History  Problem Relation Age of Onset  . Hypertension Mother   . Asthma Mother   . Heart disease Mother   . Hyperlipidemia Mother   . Thyroid disease Mother   . Alcohol abuse Father   . Asthma Sister   . Depression Sister   . Diabetes Maternal Aunt   . HIV Maternal Aunt   . Cancer Maternal Grandmother 37    COLON  . Depression Maternal Grandmother   . Alcohol abuse Maternal Grandmother   . Drug abuse Maternal Grandmother   . Alcohol abuse Maternal Grandfather   . Drug abuse Maternal Grandfather   . Stroke Maternal Grandfather   . Alcohol abuse Paternal Grandfather   . Drug abuse Paternal Grandfather   . Asthma Sister     History  Substance Use Topics  . Smoking status: Never Smoker   . Smokeless tobacco: Never Used  . Alcohol Use: No    Allergies: No Known Allergies  No prescriptions prior to admission    ROS - see HPI above  Physical Exam    Blood pressure 110/52, pulse 62, temperature 98.1 F (36.7 C), temperature source Oral, resp. rate 16, height 5\' 1"  (1.549 m), weight 203 lb 3.2 oz (92.171 kg), last menstrual period 04/11/2012, SpO2 99.00%.  Physical Exam  Chest: Clear Heart: RRR Pelvic exam: 3 / 70 / high - no change since last exam in the office on 12/07/12  FHT: Cat I UCs: Irregular  MAU Course  Procedures  NST  Assessment and Plan  IUP at [redacted]w[redacted]d R/o labor  D/c with labor precautions Encourage to call if symptoms wornsen    Lindsay Ramirez CNM, MSN 12/09/2012, 10:02 PM

## 2012-12-12 ENCOUNTER — Inpatient Hospital Stay (HOSPITAL_COMMUNITY): Payer: Medicaid Other | Admitting: Anesthesiology

## 2012-12-12 ENCOUNTER — Inpatient Hospital Stay (HOSPITAL_COMMUNITY)
Admission: AD | Admit: 2012-12-12 | Discharge: 2012-12-14 | DRG: 775 | Disposition: A | Discharge status: Home / Self Care | Payer: Medicaid Other | Source: Ambulatory Visit | Attending: Obstetrics and Gynecology | Admitting: Obstetrics and Gynecology

## 2012-12-12 ENCOUNTER — Encounter (HOSPITAL_COMMUNITY): Payer: Self-pay | Admitting: Anesthesiology

## 2012-12-12 ENCOUNTER — Encounter (HOSPITAL_COMMUNITY): Payer: Self-pay | Admitting: Family

## 2012-12-12 DIAGNOSIS — F319 Bipolar disorder, unspecified: Secondary | ICD-10-CM

## 2012-12-12 DIAGNOSIS — O358XX0 Maternal care for other (suspected) fetal abnormality and damage, not applicable or unspecified: Secondary | ICD-10-CM | POA: Diagnosis present

## 2012-12-12 DIAGNOSIS — O99344 Other mental disorders complicating childbirth: Secondary | ICD-10-CM | POA: Diagnosis present

## 2012-12-12 DIAGNOSIS — D649 Anemia, unspecified: Secondary | ICD-10-CM

## 2012-12-12 DIAGNOSIS — D573 Sickle-cell trait: Secondary | ICD-10-CM | POA: Diagnosis present

## 2012-12-12 DIAGNOSIS — O35EXX0 Maternal care for other (suspected) fetal abnormality and damage, fetal genitourinary anomalies, not applicable or unspecified: Secondary | ICD-10-CM

## 2012-12-12 DIAGNOSIS — O9902 Anemia complicating childbirth: Principal | ICD-10-CM | POA: Diagnosis present

## 2012-12-12 LAB — CBC
HCT: 31.1 % — ABNORMAL LOW (ref 36.0–46.0)
Hemoglobin: 10.6 g/dL — ABNORMAL LOW (ref 12.0–15.0)
MCHC: 34.1 g/dL (ref 30.0–36.0)
WBC: 11.2 10*3/uL — ABNORMAL HIGH (ref 4.0–10.5)

## 2012-12-12 LAB — TYPE AND SCREEN
ABO/RH(D): O POS
Antibody Screen: NEGATIVE

## 2012-12-12 MED ORDER — LIDOCAINE HCL (PF) 1 % IJ SOLN
30.0000 mL | INTRAMUSCULAR | Status: DC | PRN
  Filled 2012-12-12 (×2): qty 30

## 2012-12-12 MED ORDER — OXYTOCIN BOLUS FROM INFUSION
500.0000 mL | INTRAVENOUS | Status: DC
  Administered 2012-12-12: 500 mL via INTRAVENOUS

## 2012-12-12 MED ORDER — SERTRALINE HCL 50 MG PO TABS
50.0000 mg | ORAL_TABLET | Freq: Every day | ORAL | Status: DC
  Filled 2012-12-12: qty 1

## 2012-12-12 MED ORDER — FENTANYL 2.5 MCG/ML BUPIVACAINE 1/10 % EPIDURAL INFUSION (WH - ANES)
INTRAMUSCULAR | Status: DC | PRN
  Administered 2012-12-12: 12 mL/h via EPIDURAL

## 2012-12-12 MED ORDER — DIBUCAINE 1 % RE OINT: 1.0000 | TOPICAL_OINTMENT | RECTAL | Status: DC | PRN

## 2012-12-12 MED ORDER — DIPHENHYDRAMINE HCL 50 MG/ML IJ SOLN: 12.5000 mg | INTRAMUSCULAR | Status: DC | PRN

## 2012-12-12 MED ORDER — EPHEDRINE 5 MG/ML INJ
10.0000 mg | INTRAVENOUS | Status: DC | PRN
  Filled 2012-12-12: qty 2

## 2012-12-12 MED ORDER — ONDANSETRON HCL 4 MG/2ML IJ SOLN
4.0000 mg | Freq: Four times a day (QID) | INTRAMUSCULAR | Status: DC | PRN
  Administered 2012-12-12: 4 mg via INTRAVENOUS
  Filled 2012-12-12: qty 2

## 2012-12-12 MED ORDER — BENZOCAINE-MENTHOL 20-0.5 % EX AERO
1.0000 "application " | INHALATION_SPRAY | CUTANEOUS | Status: DC | PRN
  Administered 2012-12-13: 1 via TOPICAL
  Filled 2012-12-12: qty 56

## 2012-12-12 MED ORDER — ONDANSETRON HCL 4 MG/2ML IJ SOLN: 4.0000 mg | INTRAMUSCULAR | Status: DC | PRN

## 2012-12-12 MED ORDER — FENTANYL 2.5 MCG/ML BUPIVACAINE 1/10 % EPIDURAL INFUSION (WH - ANES)
12.0000 mL/h | INTRAMUSCULAR | Status: DC | PRN
Start: 2012-12-12 — End: 2012-12-12

## 2012-12-12 MED ORDER — BISACODYL 10 MG RE SUPP: 10.0000 mg | Freq: Every day | RECTAL | Status: DC | PRN

## 2012-12-12 MED ORDER — MEDROXYPROGESTERONE ACETATE 150 MG/ML IM SUSP: 150.0000 mg | INTRAMUSCULAR | Status: DC | PRN

## 2012-12-12 MED ORDER — FLEET ENEMA 7-19 GM/118ML RE ENEM: 1.0000 | ENEMA | Freq: Every day | RECTAL | Status: DC | PRN

## 2012-12-12 MED ORDER — ONDANSETRON HCL 4 MG PO TABS: 4.0000 mg | ORAL_TABLET | ORAL | Status: DC | PRN

## 2012-12-12 MED ORDER — LANOLIN HYDROUS EX OINT: TOPICAL_OINTMENT | CUTANEOUS | Status: DC | PRN

## 2012-12-12 MED ORDER — LACTATED RINGERS IV SOLN
INTRAVENOUS | Status: DC
  Administered 2012-12-12: 15:00:00 via INTRAVENOUS

## 2012-12-12 MED ORDER — MEASLES, MUMPS & RUBELLA VAC ~~LOC~~ INJ
0.5000 mL | INJECTION | Freq: Once | SUBCUTANEOUS | Status: DC
  Filled 2012-12-12: qty 0.5

## 2012-12-12 MED ORDER — PHENYLEPHRINE 40 MCG/ML (10ML) SYRINGE FOR IV PUSH (FOR BLOOD PRESSURE SUPPORT)
80.0000 ug | PREFILLED_SYRINGE | INTRAVENOUS | Status: DC | PRN
  Filled 2012-12-12: qty 5
  Filled 2012-12-12: qty 2

## 2012-12-12 MED ORDER — FENTANYL 2.5 MCG/ML BUPIVACAINE 1/10 % EPIDURAL INFUSION (WH - ANES)
14.0000 mL/h | INTRAMUSCULAR | Status: DC | PRN
  Filled 2012-12-12: qty 125

## 2012-12-12 MED ORDER — IBUPROFEN 600 MG PO TABS
600.0000 mg | ORAL_TABLET | Freq: Four times a day (QID) | ORAL | Status: DC
  Administered 2012-12-13 – 2012-12-14 (×6): 600 mg via ORAL
  Filled 2012-12-12 (×6): qty 1

## 2012-12-12 MED ORDER — GUAIFENESIN 100 MG/5ML PO SOLN: 5.0000 mL | ORAL | Status: DC | PRN

## 2012-12-12 MED ORDER — OXYCODONE-ACETAMINOPHEN 5-325 MG PO TABS
1.0000 | ORAL_TABLET | ORAL | Status: DC | PRN
  Administered 2012-12-13 (×2): 2 via ORAL
  Administered 2012-12-13 – 2012-12-14 (×4): 1 via ORAL
  Filled 2012-12-12: qty 1
  Filled 2012-12-12: qty 2
  Filled 2012-12-12: qty 1
  Filled 2012-12-12: qty 2
  Filled 2012-12-12 (×3): qty 1

## 2012-12-12 MED ORDER — OXYTOCIN 40 UNITS IN LACTATED RINGERS INFUSION - SIMPLE MED
62.5000 mL/h | INTRAVENOUS | Status: DC
  Filled 2012-12-12: qty 1000

## 2012-12-12 MED ORDER — PRENATAL MULTIVITAMIN CH
1.0000 | ORAL_TABLET | Freq: Every day | ORAL | Status: DC
  Administered 2012-12-13 – 2012-12-14 (×2): 1 via ORAL
  Filled 2012-12-12 (×2): qty 1

## 2012-12-12 MED ORDER — IBUPROFEN 600 MG PO TABS
600.0000 mg | ORAL_TABLET | Freq: Four times a day (QID) | ORAL | Status: DC | PRN
  Administered 2012-12-12: 600 mg via ORAL
  Filled 2012-12-12: qty 1

## 2012-12-12 MED ORDER — LACTATED RINGERS IV SOLN
500.0000 mL | Freq: Once | INTRAVENOUS | Status: AC
  Administered 2012-12-12: 14:00:00 via INTRAVENOUS

## 2012-12-12 MED ORDER — FENTANYL CITRATE 0.05 MG/ML IJ SOLN: 100.0000 ug | INTRAMUSCULAR | Status: DC | PRN

## 2012-12-12 MED ORDER — CITRIC ACID-SODIUM CITRATE 334-500 MG/5ML PO SOLN: 30.0000 mL | ORAL | Status: DC | PRN

## 2012-12-12 MED ORDER — OXYCODONE-ACETAMINOPHEN 5-325 MG PO TABS: 1.0000 | ORAL_TABLET | ORAL | Status: DC | PRN

## 2012-12-12 MED ORDER — EPHEDRINE 5 MG/ML INJ
10.0000 mg | INTRAVENOUS | Status: DC | PRN
  Filled 2012-12-12: qty 2
  Filled 2012-12-12: qty 4

## 2012-12-12 MED ORDER — WITCH HAZEL-GLYCERIN EX PADS: 1.0000 "application " | MEDICATED_PAD | CUTANEOUS | Status: DC | PRN

## 2012-12-12 MED ORDER — TETANUS-DIPHTH-ACELL PERTUSSIS 5-2.5-18.5 LF-MCG/0.5 IM SUSP: 0.5000 mL | Freq: Once | INTRAMUSCULAR | Status: DC

## 2012-12-12 MED ORDER — LACTATED RINGERS IV SOLN
500.0000 mL | INTRAVENOUS | Status: DC | PRN
  Administered 2012-12-12: 500 mL via INTRAVENOUS

## 2012-12-12 MED ORDER — ACETAMINOPHEN 325 MG PO TABS
650.0000 mg | ORAL_TABLET | ORAL | Status: DC | PRN
  Administered 2012-12-12: 650 mg via ORAL
  Filled 2012-12-12: qty 2

## 2012-12-12 MED ORDER — ZOLPIDEM TARTRATE 5 MG PO TABS: 5.0000 mg | ORAL_TABLET | Freq: Every evening | ORAL | Status: DC | PRN

## 2012-12-12 MED ORDER — SENNOSIDES-DOCUSATE SODIUM 8.6-50 MG PO TABS
2.0000 | ORAL_TABLET | Freq: Every day | ORAL | Status: DC
  Administered 2012-12-13: 2 via ORAL

## 2012-12-12 MED ORDER — DIPHENHYDRAMINE HCL 25 MG PO CAPS
25.0000 mg | ORAL_CAPSULE | Freq: Four times a day (QID) | ORAL | Status: DC | PRN
  Administered 2012-12-12: 25 mg via ORAL
  Filled 2012-12-12: qty 1

## 2012-12-12 MED ORDER — PHENYLEPHRINE 40 MCG/ML (10ML) SYRINGE FOR IV PUSH (FOR BLOOD PRESSURE SUPPORT)
80.0000 ug | PREFILLED_SYRINGE | INTRAVENOUS | Status: DC | PRN
  Filled 2012-12-12: qty 2

## 2012-12-12 MED ORDER — SIMETHICONE 80 MG PO CHEW: 80.0000 mg | CHEWABLE_TABLET | ORAL | Status: DC | PRN

## 2012-12-12 MED ORDER — LIDOCAINE HCL (PF) 1 % IJ SOLN
INTRAMUSCULAR | Status: DC | PRN
  Administered 2012-12-12: 3 mL
  Administered 2012-12-12: 4 mL

## 2012-12-12 NOTE — Anesthesia Preprocedure Evaluation (Signed)
Anesthesia Evaluation  Patient identified by MRN, date of birth, ID band Patient awake    Reviewed: Allergy & Precautions, H&P , Patient's Chart, lab work & pertinent test results  Airway Mallampati: III TM Distance: >3 FB Neck ROM: full    Dental no notable dental hx. (+) Teeth Intact   Pulmonary neg pulmonary ROS, asthma ,  breath sounds clear to auscultation  Pulmonary exam normal       Cardiovascular negative cardio ROS  Rhythm:regular Rate:Normal     Neuro/Psych  Headaches, PSYCHIATRIC DISORDERS Depression    GI/Hepatic GERD-  Medicated and Controlled,(+) Hepatitis -, B  Endo/Other  negative endocrine ROS  Renal/GU negative Renal ROS  negative genitourinary   Musculoskeletal negative musculoskeletal ROS (+)   Abdominal Normal abdominal exam  (+)   Peds  Hematology negative hematology ROS (+)   Anesthesia Other Findings   Reproductive/Obstetrics (+) Pregnancy 37 weeks                           Anesthesia Physical Anesthesia Plan  ASA: II  Anesthesia Plan: Epidural   Post-op Pain Management:    Induction:   Airway Management Planned:   Additional Equipment:   Intra-op Plan:   Post-operative Plan:   Informed Consent: I have reviewed the patients History and Physical, chart, labs and discussed the procedure including the risks, benefits and alternatives for the proposed anesthesia with the patient or authorized representative who has indicated his/her understanding and acceptance.     Plan Discussed with: Anesthesiologist  Anesthesia Plan Comments:         Anesthesia Quick Evaluation

## 2012-12-12 NOTE — H&P (Signed)
Lindsay Ramirez is a 27 y.o. female presenting for regular painful ctx since last night. Denies LOF or VB, GFM.   HPI: Pt began PNC at [facility] at 10wks   Maternal Medical History:  Reason for admission: Contractions.   Contractions: Onset was 6-12 hours ago.   Frequency: regular.   Duration is approximately 60 seconds.   Perceived severity is moderate.    Fetal activity: Perceived fetal activity is normal.   Last perceived fetal movement was within the past hour.      OB History   Grav Para Term Preterm Abortions TAB SAB Ect Mult Living   4 3 3  0 0 0 0 0 0 3     Past Medical History  Diagnosis Date  . Sickle cell trait   . Mental disorder   . Bipolar affective disorder   . Bipolar affective disorder 2006    TOOK MEDS X 3 YR  . Depression     PP 2013  . Abnormal Pap smear     LAST PAP 06/2011  . Asthma     [facility]; [facility]  . Infection     UTI  . Anemia     CHRONIC  . Headache     SEVERAL TIMES/DAY  . Hepatitis 2011    HEPATITIS B ????/States negative 11/23/12   Past Surgical History  Procedure Laterality Date  . Mouth surgery    . No past surgeries    . Wisdom tooth extraction  2012  . Vaginal delivery      X 3   Family History: family history includes Alcohol abuse in her father, maternal grandfather, maternal grandmother, and paternal grandfather; Asthma in her mother and sisters; Cancer (age of onset: 5) in her maternal grandmother; Depression in her maternal grandmother and sister; Diabetes in her maternal aunt; Drug abuse in her maternal grandfather, maternal grandmother, and paternal grandfather; HIV in her maternal aunt; Heart disease in her mother; Hyperlipidemia in her mother; Hypertension in her mother; Stroke in her maternal grandfather; and Thyroid disease in her mother. Social History:  reports that she has never smoked. She has never used smokeless tobacco. She reports that she does not drink alcohol or use illicit drugs.   Prenatal Transfer  Tool  Maternal Diabetes: No Genetic Screening: Normal Maternal Ultrasounds/Referrals: Abnormal:  Findings:   Fetal renal pyelectasis, Other: umbilical cord cyst  Fetal Ultrasounds or other Referrals:  Referred to Materal Fetal Medicine  Maternal Substance Abuse:  No Significant Maternal Medications:  Meds include: Zoloft Significant Maternal Lab Results:  None Other Comments:  None  ROS  Dilation: 7 Effacement (%): 100 Station: -2 Exam by:: L. Cresenzo, RN Blood pressure 92/56, pulse 98, temperature 98.5 F (36.9 C), temperature source Axillary, resp. rate 20, height 5\' 1"  (1.549 Ramirez), weight 203 lb (92.08 kg), last menstrual period 04/11/2012, SpO2 99.00%. Exam Physical Exam  Prenatal labs: ABO, Rh: --/--/O POS (04/06 1428) Antibody: NEG (04/06 1428) Rubella: Immune (09/13 0000) RPR: NON REAC (01/20 1132)  HBsAg: Negative (09/13 0000)  HIV: Non-reactive (09/13 0000)  GBS: Negative (04/01 0000)   Assessment/Plan: IUP at [redacted]w[redacted]d Early/active labor GBS neg FHR reassuring  Admit to b.s. Per c/w Dr Estanislado Pandy Routine L&D orders Fentanyl IVP  Epidural PRN Expectant mgmnt    Lindsay Ramirez 12/12/2012, 8:38 PM

## 2012-12-12 NOTE — Progress Notes (Signed)
Patient ID: Lindsay Ramirez, female   DOB: [birth year], 27 y.o.   MRN: [id number] HINDY PERRAULT is a 27 y.o. [phone] at [redacted]w[redacted]d admitted for labor  Subjective: Comfortable w epidural   Objective: BP 98/47  Pulse 64  Temp(Src) 98 F (36.7 C) (Oral)  Resp 20  Ht 5\' 1"  (1.549 m)  Wt 203 lb (92.08 kg)  BMI 38.38 kg/m2  SpO2 99%  LMP 04/11/2012  Total I/O In: -  Out: 450 [Urine:450]  FHT:  FHR: 130 bpm, variability: moderate,  accelerations:  Present,  decelerations:  Absent UC:   regular, every 3-5  minutes SVE:   Dilation: 5 Effacement (%): 100 Station: -2 Exam by:: SLiLLard,CNM  AROM, lg amt clear fluid   Assessment / Plan: Spontaneous labor, progressing normally  Labor: Progressing normally Preeclampsia:  no signs or symptoms of toxicity Fetal Wellbeing:  Category I Pain Control:  Epidural Anticipated MOD:  NSVD  Recheck 2 hours   Update physician PRN   Malissa Hippo 12/12/2012, 6:58 PM

## 2012-12-12 NOTE — Progress Notes (Signed)
Lab at bs for labs. For epidural. Pt sitting on SOB.

## 2012-12-12 NOTE — MAU Note (Signed)
Patient presents to MAU with c/o contractions worsening since 1030 today. Denies vaginal bleeding, LOF. Reports last fetal movement on Friday.

## 2012-12-12 NOTE — Anesthesia Procedure Notes (Signed)
Epidural Patient location during procedure: OB Start time: 12/12/2012 3:06 PM  Staffing Anesthesiologist: Keishla Oyer A. Performed by: anesthesiologist   Preanesthetic Checklist Completed: patient identified, site marked, surgical consent, pre-op evaluation, timeout performed, IV checked, risks and benefits discussed and monitors and equipment checked  Epidural Patient position: sitting Prep: site prepped and draped and DuraPrep Patient monitoring: continuous pulse ox and blood pressure Approach: midline Injection technique: LOR air  Needle:  Needle type: Tuohy  Needle gauge: 17 G Needle length: 9 cm and 9 Needle insertion depth: 7 cm Catheter type: closed end flexible Catheter size: 19 Gauge Catheter at skin depth: 12 cm Test dose: negative and Other  Assessment Events: blood not aspirated, injection not painful, no injection resistance, negative IV test and no paresthesia  Additional Notes Patient identified. Risks and benefits discussed including failed block, incomplete  Pain control, post dural puncture headache, nerve damage, paralysis, blood pressure Changes, nausea, vomiting, reactions to medications-both toxic and allergic and post Partum back pain. All questions were answered. Patient expressed understanding and wished to proceed. Sterile technique was used throughout procedure. Epidural site was Dressed with sterile barrier dressing. No paresthesias, signs of intravascular injection Or signs of intrathecal spread were encountered. Difficult due to poor positioning. Attempt x 3. Patient was more comfortable after the epidural was dosed. Please see RN's note for documentation of vital signs and FHR which are stable.

## 2012-12-13 ENCOUNTER — Encounter (HOSPITAL_COMMUNITY): Payer: Self-pay | Admitting: *Deleted

## 2012-12-13 LAB — CBC
HCT: 29.3 % — ABNORMAL LOW (ref 36.0–46.0)
Hemoglobin: 9.9 g/dL — ABNORMAL LOW (ref 12.0–15.0)
MCH: 28 pg (ref 26.0–34.0)
MCHC: 33.8 g/dL (ref 30.0–36.0)
MCV: 82.8 fL (ref 78.0–100.0)
Platelets: 210 K/uL (ref 150–400)
RBC: 3.54 MIL/uL — ABNORMAL LOW (ref 3.87–5.11)
RDW: 15.1 % (ref 11.5–15.5)
WBC: 15.3 K/uL — ABNORMAL HIGH (ref 4.0–10.5)

## 2012-12-13 MED ORDER — SODIUM CHLORIDE 0.9 % IV SOLN
3.0000 g | Freq: Four times a day (QID) | INTRAVENOUS | Status: DC
  Administered 2012-12-13 (×3): 3 g via INTRAVENOUS
  Filled 2012-12-13 (×4): qty 3

## 2012-12-13 MED ORDER — MEDROXYPROGESTERONE ACETATE 150 MG/ML IM SUSP
150.0000 mg | Freq: Once | INTRAMUSCULAR | Status: AC
  Administered 2012-12-13: 150 mg via INTRAMUSCULAR
  Filled 2012-12-13: qty 1

## 2012-12-13 MED ORDER — MEDROXYPROGESTERONE ACETATE 150 MG/ML IM SUSP: 150.0000 mg | Freq: Once | INTRAMUSCULAR | Status: DC

## 2012-12-13 NOTE — Progress Notes (Signed)
Ur chart review completed.  

## 2012-12-13 NOTE — Clinical Social Work Maternal (Signed)
Clinical Social Work Department PSYCHOSOCIAL ASSESSMENT - MATERNAL/CHILD 12/13/2012  Patient:  Lindsay Ramirez, Lindsay Ramirez  Account Number:  [id number]  Admit Date:  12/12/2012  Marjo Bicker Name:   Dorann Lodge    Clinical Social Worker:  Nobie Putnam, LCSW   Date/Time:  12/13/2012 02:43 PM  Date Referred:  12/13/2012   Referral source  CN     Referred reason  Behavioral Health Issues  Adoption   Other referral source:    I:  FAMILY / HOME ENVIRONMENT Child's legal guardian:  PARENT  Guardian - Name Guardian - Age Guardian - Address  [place] [address] [place], [place] [address]  Estil Daft 40 (same as above)   Other household support members/support persons Name Relationship DOB   DAUGHTER 08/03/06   DAUGHTER 03/10/11   SON 02/14/12   Other support:    II  PSYCHOSOCIAL DATA Information Source:  Patient Interview  Event organiser Employment:   Financial resources:  Medicaid If Medicaid - County:  [place] Other  Sales executive  WIC   School / Grade:   Maternity Care Coordinator / Child Services Coordination / Early Interventions:  Cultural issues impacting care:    III  STRENGTHS Strengths  Adequate Resources  Supportive family/friends   Strength comment:    IV  RISK FACTORS AND CURRENT PROBLEMS Current Problem:  YES   Risk Factor & Current Problem Patient Issue Family Issue Risk Factor / Current Problem Comment  Mental Illness Y N Hx of bipolar disorder, PP depression & SI  Other - See comment Y N Adoption plan    V  SOCIAL WORK ASSESSMENT CSW met with a tearful pt to discuss her decision to make an adoption plan.  Pt told CSW that she has 3 small children already & did not have the finances to care for another child.  Additionally, pt spoke briefly about the FOB having a substance abuse problem, which causes him to leave for long periods of time.  Pt said she decided to make an adoption plan with her mother, Irene Collings at the  beginning of the pregnancy.  Since delivery, pt has a change of heart & does not want to go through with the plan but afraid to tell her mother. Pt explained that her mother would be very upset with her & probably stop speaking to her.  The pt has not purchased any supplies for the infant & thinks her mother will withhold all items she has purchased thus far.  CSW explained the adoption process & necessary paperwork required.  Pt told CSW that she was unaware that her mother hired an attorney & she is expected to sign relinquishment paper work today.  Pt seems willing to go through with adoption plan so that her mother will not be upset.  Pt's mother will not allow her to bond (skin-to-skin) with the infant.  CSW explained [place] adoption law & informed that FOB would have to relinquish his rights as well.  Pt told CSW that she does not think FOB wants to go through with plan but willing to sign, for her.  FOB joined the conversation after pt called to request his presence/input.  FOB told CSW that he thinks the adoption plan is best because the couple has 2 small children & "finances are tight."  FOB says he feels comfortable with the plan because they will be able to see the child often.  CSW suggested that the couple speak privately & openly about the  situation (since pt's mother is no longer at the hospital).  CSW will return once pt calls when she is ready to discuss further.  CSW is concerned about pt's mental health & limited support.  CSW will discuss mental health follow up, when pt is ready to discuss situation further.      VI SOCIAL WORK PLAN Social Work Plan  No Further Intervention Required / No Barriers to Discharge   Type of pt/family education:   If child protective services report - county:   If child protective services report - date:   Information/referral to community resources comment:   Other social work plan:

## 2012-12-13 NOTE — Anesthesia Postprocedure Evaluation (Signed)
  Anesthesia Post-op Note  Patient: Lindsay Ramirez  Procedure(s) Performed: * No procedures listed *  Patient Location: Mother/Baby  Anesthesia Type:Epidural  Level of Consciousness: awake, alert  and oriented  Airway and Oxygen Therapy: Patient Spontanous Breathing  Post-op Pain: none  Post-op Assessment: Post-op Vital signs reviewed, Patient's Cardiovascular Status Stable, Respiratory Function Stable, Patent Airway, No signs of Nausea or vomiting and Pain level controlled  Post-op Vital Signs: Reviewed and stable  Complications: No apparent anesthesia complications

## 2012-12-13 NOTE — Progress Notes (Signed)
Post Partum Day 1:S/P SVB, intact perineum Subjective: Patient up ad lib, denies syncope or dizziness.  Wants d/c today (delivered 8:54pm, mother taking custody of baby) Feeding:  Bottle Contraceptive plan:   DepoProvera at d/c  Objective: Blood pressure 106/63, pulse 62, temperature 98.1 F (36.7 C), temperature source Oral, resp. rate 18, height 5\' 1"  (1.549 m), weight 203 lb (92.08 kg), last menstrual period 04/11/2012, SpO2 95.00%, unknown if currently breastfeeding.  Physical Exam:  General: alert Lochia: appropriate Uterine Fundus: firm Incision: NA DVT Evaluation: No evidence of DVT seen on physical exam. Negative Homan's sign.   Recent Labs  12/12/12 1420 12/13/12 0620  HGB 10.6* 9.9*  HCT 31.1* 29.3*    Assessment/Plan: S/P Vaginal delivery day 1 Desires early d/c Wants DepoProvera at d/c  Continue current care Will await peds decision regarding d/c for baby. D/C later today if OK with peds.   LOS: 1 day   Nigel Bridgeman 12/13/2012, 9:14 AM

## 2012-12-14 MED ORDER — IBUPROFEN 600 MG PO TABS: 600.0000 mg | ORAL_TABLET | Freq: Four times a day (QID) | ORAL | Status: DC

## 2012-12-14 MED ORDER — OXYCODONE-ACETAMINOPHEN 5-325 MG PO TABS: 1.0000 | ORAL_TABLET | ORAL | Status: DC | PRN

## 2012-12-14 NOTE — Progress Notes (Signed)
Pt decided to sign relinquishment paper work yesterday evening. Copy placed in the infants chart. Pt told CSW that she feels good about decision. CSW is concerned about pt's increased risk of PP depression. Pt is also concerned & agrees to restart Zoloft. CSW spoke with midwife to request that pt received a prescription prior to discharge. Pt's mother was in the room, attending to the infant. CSW encouraged pt to seek medical attention if needed or contact this CSW for counseling referrals.       

## 2012-12-14 NOTE — Discharge Summary (Signed)
  Vaginal Delivery Discharge Summary  TEAL RABEN  DOB:    1986-01-04 MRN:    [id number] CSN:    [id number]  Date of admission:                  12/12/12  Date of discharge:                   12/14/12  Procedures this admission:  SVD   Newborn Data:  Live born female  Birth Weight: 5 lb 11.9 oz (2605 g) APGAR: 7, 9  Home with mother.  History of Present Illness:  Ms. JALACIA MATTILA is a 27 y.o. female, [phone], who presents at [redacted]w[redacted]d weeks gestation. The patient has been followed at the [facility] Gy[facility]cology division of [facility]. She was admitted onset of labor. Her pregnancy has been complicated by:   Patient Active Problem List  Diagnosis  . Acid reflux  . BV (bacterial vaginosis)  . Short interval between pregnancies complicating pregnancy, antepartum  . Vitamin D deficiency  . Hemoglobin A-S genotype  . Pyelectasis of fetus on prenatal ultrasound  . Bipolar disorder, unspecified/depression  . NSVD (normal spontaneous vaginal delivery)    Hospital course:  The patient was admitted for onset of labor.   Her labor was not complicated. She proceeded to have a vaginal delivery of a healthy infant. Her delivery was not complicated. Her postpartum course was complicated by challenges surrounding pt's decision to give baby up to pt's mother.  According to SW consult pt was not sure this is what she wanted to do but by day of d/c she signed the adoption papers.  She was discharged to home on postpartum day 2 doing well.  Feeding:  bottle  Contraception:  Depo-Provera given today  Discharge hemoglobin:  Hemoglobin  Date Value Range Status  12/13/2012 9.9* 12.0 - 15.0 g/dL Final     HCT  Date Value Range Status  12/13/2012 29.3* 36.0 - 46.0 % Final    Discharge Physical Exam:   General: alert, cooperative and no distress Lochia: appropriate Uterine Fundus: firm Incision: n/a DVT Evaluation: No evidence of DVT seen on  physical exam. Negative Homan's sign.  Intrapartum Procedures: spontaneous vaginal delivery Postpartum Procedures: none Complications-Operative and Postpartum: none  Discharge Diagnoses: Term Pregnancy-delivered  Discharge Information:  Activity:           per [facility] handbook Diet:                routine Medications: Ibuprofen, Percocet and Zoloft Condition:      stable Instructions:  refer to practice specific booklet Discharge to: home  Follow-up Information   Follow up with [facility]. Schedule an appointment as soon as possible for a visit in 5 weeks. (Call with any questions or concerns)    Contact information:   [address] [place] [address] [phone]       Haroldine Laws CNM, MSN 12/14/2012

## 2012-12-16 ENCOUNTER — Ambulatory Visit (HOSPITAL_COMMUNITY)
Admission: RE | Admit: 2012-12-16 | Discharge: 2012-12-16 | Disposition: A | Discharge status: Home / Self Care | Payer: Medicaid Other | Source: Ambulatory Visit | Attending: Internal Medicine | Admitting: Internal Medicine

## 2012-12-16 ENCOUNTER — Encounter (HOSPITAL_COMMUNITY): Payer: Medicaid Other

## 2012-12-16 ENCOUNTER — Other Ambulatory Visit (HOSPITAL_COMMUNITY): Payer: Self-pay | Admitting: Obstetrics and Gynecology

## 2012-12-16 DIAGNOSIS — M25569 Pain in unspecified knee: Secondary | ICD-10-CM

## 2013-03-10 ENCOUNTER — Encounter (HOSPITAL_COMMUNITY): Payer: Self-pay | Admitting: Family Medicine

## 2013-03-10 ENCOUNTER — Emergency Department (HOSPITAL_COMMUNITY): Payer: Medicaid Other

## 2013-03-10 ENCOUNTER — Emergency Department (HOSPITAL_COMMUNITY)
Admission: EM | Admit: 2013-03-10 | Discharge: 2013-03-10 | Disposition: A | Discharge status: Home / Self Care | Payer: Medicaid Other | Attending: Emergency Medicine | Admitting: Emergency Medicine

## 2013-03-10 DIAGNOSIS — Z87440 Personal history of urinary (tract) infections: Secondary | ICD-10-CM | POA: Insufficient documentation

## 2013-03-10 DIAGNOSIS — F319 Bipolar disorder, unspecified: Secondary | ICD-10-CM | POA: Insufficient documentation

## 2013-03-10 DIAGNOSIS — J45909 Unspecified asthma, uncomplicated: Secondary | ICD-10-CM | POA: Insufficient documentation

## 2013-03-10 DIAGNOSIS — Z8719 Personal history of other diseases of the digestive system: Secondary | ICD-10-CM | POA: Insufficient documentation

## 2013-03-10 DIAGNOSIS — S6391XA Sprain of unspecified part of right wrist and hand, initial encounter: Secondary | ICD-10-CM

## 2013-03-10 DIAGNOSIS — Y9389 Activity, other specified: Secondary | ICD-10-CM | POA: Insufficient documentation

## 2013-03-10 DIAGNOSIS — S6390XA Sprain of unspecified part of unspecified wrist and hand, initial encounter: Secondary | ICD-10-CM | POA: Insufficient documentation

## 2013-03-10 DIAGNOSIS — W208XXA Other cause of strike by thrown, projected or falling object, initial encounter: Secondary | ICD-10-CM | POA: Insufficient documentation

## 2013-03-10 DIAGNOSIS — Z8659 Personal history of other mental and behavioral disorders: Secondary | ICD-10-CM | POA: Insufficient documentation

## 2013-03-10 DIAGNOSIS — Z862 Personal history of diseases of the blood and blood-forming organs and certain disorders involving the immune mechanism: Secondary | ICD-10-CM | POA: Insufficient documentation

## 2013-03-10 DIAGNOSIS — Y929 Unspecified place or not applicable: Secondary | ICD-10-CM | POA: Insufficient documentation

## 2013-03-10 NOTE — ED Notes (Signed)
Pt waiting for xray.

## 2013-03-10 NOTE — ED Provider Notes (Signed)
History  This chart was scribed for non-physician practitioner working with Flint Melter, MD by Greggory Stallion, ED scribe. This patient was seen in room TR07C/TR07C and the patient's care was started at 5:47 PM.  CSN: [id number] Arrival date & time 03/10/13  1732   Chief Complaint  Patient presents with  . Hand Injury    The history is provided by the patient. No language interpreter was used.    HPI Comments: Lindsay Ramirez is a 27 y.o. female who presents to the Emergency Department complaining of right hand injury that happened last night when she was moving a desk and dropping it on her hand. Pt rates her pain 7/10 today but states it was 10/10 last night, began to swell this morning. She states movement of her hand and touching it makes the pain worse. She denies wrist pain. Pt states she has taken ibuprofen and Tylenol with no relief. Also requesting a pregnancy test at triage and questioning multiple other complaints. States she was at work all day and could not make it to her PCP office for a general visit, decided to come to the ED instead. Main concern today is hand pain.  Past Medical History  Diagnosis Date  . Sickle cell trait   . Mental disorder   . Bipolar affective disorder   . Bipolar affective disorder 2006    TOOK MEDS X 3 YR  . Depression     PP 2013  . Abnormal Pap smear     LAST PAP 06/2011  . Asthma     [facility]; [facility]  . Infection     UTI  . Anemia     CHRONIC  . Headache(784.0)     SEVERAL TIMES/DAY  . Hepatitis 2011    HEPATITIS B ????/States negative 11/23/12   Past Surgical History  Procedure Laterality Date  . Mouth surgery    . No past surgeries    . Wisdom tooth extraction  2012  . Vaginal delivery      X 3   Family History  Problem Relation Age of Onset  . Hypertension Mother   . Asthma Mother   . Heart disease Mother   . Hyperlipidemia Mother   . Thyroid disease Mother   . Alcohol abuse Father   . Asthma Sister   .  Depression Sister   . Diabetes Maternal Aunt   . HIV Maternal Aunt   . Cancer Maternal Grandmother 37    COLON  . Depression Maternal Grandmother   . Alcohol abuse Maternal Grandmother   . Drug abuse Maternal Grandmother   . Alcohol abuse Maternal Grandfather   . Drug abuse Maternal Grandfather   . Stroke Maternal Grandfather   . Alcohol abuse Paternal Grandfather   . Drug abuse Paternal Grandfather   . Asthma Sister    History  Substance Use Topics  . Smoking status: Never Smoker   . Smokeless tobacco: Never Used  . Alcohol Use: No   OB History   Grav Para Term Preterm Abortions TAB SAB Ect Mult Living   4 4 3 1  0 0 0 0 0 4     Review of Systems  A complete 10 system review of systems was obtained and all systems are negative except as noted in the HPI and PMH.   Allergies  Review of patient's allergies indicates no known allergies.  Home Medications   Current Outpatient Rx  Name  Route  Sig  Dispense  Refill  .  ibuprofen (ADVIL,MOTRIN) 600 MG tablet   Oral   Take 1 tablet (600 mg total) by mouth every 6 (six) hours.   30 tablet   2   . oxyCODONE-acetaminophen (PERCOCET/ROXICET) 5-325 MG per tablet   Oral   Take 1-2 tablets by mouth every 4 (four) hours as needed.   30 tablet   0   . sertraline (ZOLOFT) 50 MG tablet   Oral   Take 50 mg by mouth at bedtime.          BP 119/72  Pulse 68  Temp(Src) 98.9 F (37.2 C) (Oral)  Resp 18  SpO2 97%  LMP 01/08/2013  Physical Exam  Nursing note and vitals reviewed. Constitutional: She is oriented to person, place, and time. She appears well-developed and well-nourished. No distress.  HENT:  Head: Normocephalic and atraumatic.  Mouth/Throat: Oropharynx is clear and moist.  Eyes: Conjunctivae and EOM are normal.  Neck: Normal range of motion. Neck supple.  Cardiovascular: Normal rate, regular rhythm, normal heart sounds and intact distal pulses.   Capillary refill < 3 seconds. +2 radial pulses.    Pulmonary/Chest: Effort normal and breath sounds normal. No respiratory distress.  Musculoskeletal: Normal range of motion. She exhibits no edema.  Swelling to right first MTP joint. Decreased ROM limited by pain. Tenderness to first MTP joint and thenar eminence. Full ROM of right wrist.   Neurological: She is alert and oriented to person, place, and time. No sensory deficit.  Skin: Skin is warm and dry.  Psychiatric: She has a normal mood and affect. Her behavior is normal.    ED Course  Procedures (including critical care time)  DIAGNOSTIC STUDIES: Oxygen Saturation is 97% on RA, normal by my interpretation.    COORDINATION OF CARE: 5:50 PM-Discussed treatment plan which includes hand xray with pt at bedside and pt agreed to plan.   Labs Reviewed - No data to display Dg Hand Complete Right  03/10/2013   *RADIOLOGY REPORT*  Clinical Data: Right hand injury, bruising to anterior first digit  RIGHT HAND - COMPLETE 3+ VIEW  Comparison: None.  Findings: No fracture or dislocation is seen.  The joint spaces are preserved.  Visualized soft tissues are grossly unremarkable.  IMPRESSION: No fracture or dislocation is seen.   Original Report Authenticated By: Charline Bills, M.D.   1. Hand sprain, right, initial encounter     MDM  Right hand sprain-x-ray without any acute abnormality. Ace wrap given. Conservative measures discussed. She'll rest, ice and elevate her hand. Tylenol Motrin for pain. Patient states understanding of plan and is agreeable.      I personally performed the services described in this documentation, which was scribed in my presence. The recorded information has been reviewed and is accurate.   Trevor Mace, PA-C 03/10/13 [phone]

## 2013-03-10 NOTE — ED Notes (Signed)
Per pt she was moving a desk and it fell on her hand and now right hand is swollen. sts also thinks she has gout in right foot. Pt requesting pregnancy test.

## 2013-03-11 NOTE — ED Provider Notes (Signed)
Medical screening examination/treatment/procedure(s) were performed by non-physician practitioner and as supervising physician I was immediately available for consultation/collaboration.  Flint Melter, MD 03/11/13 [phone]

## 2013-06-03 ENCOUNTER — Encounter (HOSPITAL_COMMUNITY): Payer: Self-pay

## 2013-06-03 ENCOUNTER — Emergency Department (HOSPITAL_COMMUNITY): Payer: Medicaid Other

## 2013-06-03 ENCOUNTER — Emergency Department (HOSPITAL_COMMUNITY)
Admission: EM | Admit: 2013-06-03 | Discharge: 2013-06-03 | Disposition: A | Discharge status: Home / Self Care | Payer: Medicaid Other | Attending: Emergency Medicine | Admitting: Emergency Medicine

## 2013-06-03 DIAGNOSIS — R0789 Other chest pain: Secondary | ICD-10-CM

## 2013-06-03 DIAGNOSIS — J9801 Acute bronchospasm: Secondary | ICD-10-CM | POA: Insufficient documentation

## 2013-06-03 DIAGNOSIS — Z87440 Personal history of urinary (tract) infections: Secondary | ICD-10-CM | POA: Insufficient documentation

## 2013-06-03 DIAGNOSIS — Z862 Personal history of diseases of the blood and blood-forming organs and certain disorders involving the immune mechanism: Secondary | ICD-10-CM | POA: Insufficient documentation

## 2013-06-03 DIAGNOSIS — Z3202 Encounter for pregnancy test, result negative: Secondary | ICD-10-CM | POA: Insufficient documentation

## 2013-06-03 DIAGNOSIS — J45901 Unspecified asthma with (acute) exacerbation: Secondary | ICD-10-CM | POA: Insufficient documentation

## 2013-06-03 DIAGNOSIS — F411 Generalized anxiety disorder: Secondary | ICD-10-CM | POA: Insufficient documentation

## 2013-06-03 DIAGNOSIS — Z8619 Personal history of other infectious and parasitic diseases: Secondary | ICD-10-CM | POA: Insufficient documentation

## 2013-06-03 DIAGNOSIS — R071 Chest pain on breathing: Secondary | ICD-10-CM | POA: Insufficient documentation

## 2013-06-03 DIAGNOSIS — Z8659 Personal history of other mental and behavioral disorders: Secondary | ICD-10-CM | POA: Insufficient documentation

## 2013-06-03 LAB — BASIC METABOLIC PANEL
Chloride: 104 mEq/L (ref 96–112)
Creatinine, Ser: 0.82 mg/dL (ref 0.50–1.10)
GFR calc Af Amer: >90 mL/min (ref >90)
GFR calc non Af Amer: >90 mL/min (ref >90)
Potassium: 3.6 mEq/L (ref 3.5–5.1)

## 2013-06-03 LAB — POCT I-STAT TROPONIN I

## 2013-06-03 LAB — URINALYSIS, ROUTINE W REFLEX MICROSCOPIC
Bilirubin Urine: NEGATIVE
Glucose, UA: NEGATIVE mg/dL
Ketones, ur: NEGATIVE mg/dL
Protein, ur: NEGATIVE mg/dL
pH: 7 (ref 5.0–8.0)

## 2013-06-03 LAB — URINE MICROSCOPIC-ADD ON

## 2013-06-03 LAB — PRO B NATRIURETIC PEPTIDE: Pro B Natriuretic peptide (BNP): 22.1 pg/mL (ref 0–125)

## 2013-06-03 MED ORDER — ALBUTEROL SULFATE HFA 108 (90 BASE) MCG/ACT IN AERS
1.0000 | INHALATION_SPRAY | Freq: Once | RESPIRATORY_TRACT | Status: AC
  Administered 2013-06-03: 1 via RESPIRATORY_TRACT
  Filled 2013-06-03: qty 6.7

## 2013-06-03 NOTE — ED Notes (Signed)
Pt reports sudden onset of SOB, mid-sternum chest pain, blurred vision, and numbness to her top lip starting approx 30 mins ago. Neuro is intact, no arm drift, no facial droop or slurred speech noted

## 2013-06-03 NOTE — ED Provider Notes (Addendum)
CSN: [id number]     Arrival date & time 06/03/13  1236 History   First MD Initiated Contact with Patient 06/03/13 1608     Chief Complaint  Patient presents with  . Shortness of Breath   (Consider location/radiation/quality/duration/timing/severity/associated sxs/prior Treatment) Patient is a 27 y.o. female presenting with chest pain. The history is provided by the patient.  Chest Pain Pain location:  Substernal area Pain quality: sharp and stabbing   Pain radiates to:  Does not radiate Pain radiates to the back: no   Pain severity:  Moderate Onset quality:  Sudden Duration:  1 hour Timing:  Constant Progression:  Improving Chronicity:  Recurrent Context: breathing   Relieved by:  Nothing Worsened by:  Deep breathing and movement Ineffective treatments:  None tried Associated symptoms: anxiety   Associated symptoms: no abdominal pain, no altered mental status, no cough, no dysphagia, no numbness, no shortness of breath, not vomiting and no weakness   Associated symptoms comment:  Wheezing earlier today and last night.  Hx of asthma.  States when all this started and some tingling in her upper lip and blurred vision. Risk factors: no birth control, no coronary artery disease, no diabetes mellitus, no high cholesterol, no hypertension, not pregnant, no prior DVT/PE and no smoking     Past Medical History  Diagnosis Date  . Sickle cell trait   . Mental disorder   . Bipolar affective disorder   . Bipolar affective disorder 2006    TOOK MEDS X 3 YR  . Depression     PP 2013  . Abnormal Pap smear     LAST PAP 06/2011  . Asthma     [facility]; [facility]  . Infection     UTI  . Anemia     CHRONIC  . Headache(784.0)     SEVERAL TIMES/DAY  . Hepatitis 2011    HEPATITIS B ????/States negative 11/23/12   Past Surgical History  Procedure Laterality Date  . Mouth surgery    . No past surgeries    . Wisdom tooth extraction  2012  . Vaginal delivery      X 3   Family  History  Problem Relation Age of Onset  . Hypertension Mother   . Asthma Mother   . Heart disease Mother   . Hyperlipidemia Mother   . Thyroid disease Mother   . Alcohol abuse Father   . Asthma Sister   . Depression Sister   . Diabetes Maternal Aunt   . HIV Maternal Aunt   . Cancer Maternal Grandmother 37    COLON  . Depression Maternal Grandmother   . Alcohol abuse Maternal Grandmother   . Drug abuse Maternal Grandmother   . Alcohol abuse Maternal Grandfather   . Drug abuse Maternal Grandfather   . Stroke Maternal Grandfather   . Alcohol abuse Paternal Grandfather   . Drug abuse Paternal Grandfather   . Asthma Sister    History  Substance Use Topics  . Smoking status: Never Smoker   . Smokeless tobacco: Never Used  . Alcohol Use: No   OB History   Grav Para Term Preterm Abortions TAB SAB Ect Mult Living   4 4 3 1  0 0 0 0 0 4     Review of Systems  HENT: Negative for trouble swallowing.   Respiratory: Negative for cough and shortness of breath.   Cardiovascular: Positive for chest pain.  Gastrointestinal: Negative for vomiting and abdominal pain.  Neurological: Negative for weakness and  numbness.  All other systems reviewed and are negative.    Allergies  Review of patient's allergies indicates no known allergies.  Home Medications  No current outpatient prescriptions on file. BP 109/72  Pulse 63  Temp(Src) 98.1 F (36.7 C) (Oral)  Resp 18  SpO2 100%  LMP 04/10/2013 Physical Exam  Nursing note and vitals reviewed. Constitutional: She is oriented to person, place, and time. She appears well-developed and well-nourished. No distress.  HENT:  Head: Normocephalic and atraumatic.  Mouth/Throat: Oropharynx is clear and moist.  Eyes: Conjunctivae and EOM are normal. Pupils are equal, round, and reactive to light.  Neck: Normal range of motion. Neck supple.  Cardiovascular: Normal rate, regular rhythm and intact distal pulses.   No murmur  heard. Pulmonary/Chest: Effort normal and breath sounds normal. No respiratory distress. She has no wheezes. She has no rales. She exhibits tenderness.  Abdominal: Soft. She exhibits no distension. There is no tenderness. There is no rebound and no guarding.  Musculoskeletal: Normal range of motion. She exhibits no edema and no tenderness.  Neurological: She is alert and oriented to person, place, and time.  Skin: Skin is warm and dry. No rash noted. No erythema.  Psychiatric: She has a normal mood and affect. Her behavior is normal.    ED Course  Procedures (including critical care time) Labs Review Labs Reviewed  BASIC METABOLIC PANEL - Abnormal; Notable for the following:    BUN 4 (*)    All other components within normal limits  PRO B NATRIURETIC PEPTIDE  URINALYSIS, ROUTINE W REFLEX MICROSCOPIC  POCT I-STAT TROPONIN I   Imaging Review Dg Chest 2 View  06/03/2013   CLINICAL DATA:  Shortness of breath. Asthma. History of sickle cell trait.  EXAM: CHEST  2 VIEW  COMPARISON:  08/28/2008  FINDINGS: Cardiothoracic indexed 51%, compatible with borderline cardiomegaly. The lungs appear clear. No pleural effusion. No significant osseous abnormality.  IMPRESSION: 1. Borderline cardiomegaly. Otherwise, no significant abnormalities are observed.   Electronically Signed   By: Herbie Baltimore   On: 06/03/2013 13:36    Date: 06/03/2013  Rate: 69  Rhythm: normal sinus rhythm  QRS Axis: normal  Intervals: normal  ST/T Wave abnormalities: nonspecific T wave changes  Conduction Disutrbances:none  Narrative Interpretation:   Old EKG Reviewed: none available    MDM   1. Chest wall pain   2. Bronchospasm     Patient presents with atypical chest pain that is pleuritic in nature that started earlier today. She states it is still currently there but denies any shortness of breath. When this started she also felt panicked and had tingling in her upper lip. She does suffer from bipolar disease  and depression and states she's had episodes like this before but this one was longer than normal. She has chest wall tenderness with palpation but clear lungs without wheezing.  Patient's urine pregnancy test is negative and she is perc negative.  Patient does have a history of asthma but denies any URI symptoms recently.  Patient is currently taking no medications and denies any tobacco use.  CBC, CMP, troponin, BNP all done in triage are negative. Chest x-ray looks within normal limits. EKG with nonspecific T-wave changes but no signs of acute MI. The patient's symptoms resolved after albuterol. He'll most likely patient's symptoms are caused by asthma today. She was given return precautions and she will follow up with her PCP if symptoms persist   Gwyneth Sprout, MD 06/03/13 1729  Gwyneth Sprout,  MD 06/03/13 1731

## 2013-06-15 DIAGNOSIS — J45909 Unspecified asthma, uncomplicated: Secondary | ICD-10-CM | POA: Diagnosis present

## 2013-06-15 DIAGNOSIS — M109 Gout, unspecified: Secondary | ICD-10-CM | POA: Insufficient documentation

## 2013-07-29 ENCOUNTER — Emergency Department (HOSPITAL_COMMUNITY)
Admission: EM | Admit: 2013-07-29 | Discharge: 2013-07-29 | Disposition: A | Discharge status: Home / Self Care | Payer: Medicaid Other | Attending: Emergency Medicine | Admitting: Emergency Medicine

## 2013-07-29 ENCOUNTER — Encounter (HOSPITAL_COMMUNITY): Payer: Self-pay | Admitting: Emergency Medicine

## 2013-07-29 DIAGNOSIS — J45909 Unspecified asthma, uncomplicated: Secondary | ICD-10-CM | POA: Insufficient documentation

## 2013-07-29 DIAGNOSIS — F319 Bipolar disorder, unspecified: Secondary | ICD-10-CM | POA: Insufficient documentation

## 2013-07-29 DIAGNOSIS — Z8619 Personal history of other infectious and parasitic diseases: Secondary | ICD-10-CM | POA: Insufficient documentation

## 2013-07-29 DIAGNOSIS — Z87440 Personal history of urinary (tract) infections: Secondary | ICD-10-CM | POA: Insufficient documentation

## 2013-07-29 DIAGNOSIS — F329 Major depressive disorder, single episode, unspecified: Secondary | ICD-10-CM

## 2013-07-29 DIAGNOSIS — Z3202 Encounter for pregnancy test, result negative: Secondary | ICD-10-CM | POA: Insufficient documentation

## 2013-07-29 DIAGNOSIS — Z862 Personal history of diseases of the blood and blood-forming organs and certain disorders involving the immune mechanism: Secondary | ICD-10-CM | POA: Insufficient documentation

## 2013-07-29 DIAGNOSIS — Z79899 Other long term (current) drug therapy: Secondary | ICD-10-CM | POA: Insufficient documentation

## 2013-07-29 LAB — COMPREHENSIVE METABOLIC PANEL
ALT: 8 U/L (ref 0–35)
AST: 14 U/L (ref 0–37)
BUN: 6 mg/dL (ref 6–23)
CO2: 21 mEq/L (ref 19–32)
Chloride: 106 mEq/L (ref 96–112)
Creatinine, Ser: 0.73 mg/dL (ref 0.50–1.10)
GFR calc Af Amer: >90 mL/min (ref >90)
GFR calc non Af Amer: >90 mL/min (ref >90)
Glucose, Bld: 113 mg/dL — ABNORMAL HIGH (ref 70–99)
Total Bilirubin: 0.2 mg/dL — ABNORMAL LOW (ref 0.3–1.2)
Total Protein: 7.8 g/dL (ref 6.0–8.3)

## 2013-07-29 LAB — CBC
HCT: 35.4 % — ABNORMAL LOW (ref 36.0–46.0)
Hemoglobin: 12.1 g/dL (ref 12.0–15.0)
MCHC: 34.2 g/dL (ref 30.0–36.0)
MCV: 79.4 fL (ref 78.0–100.0)
Platelets: 310 10*3/uL (ref 150–400)
RBC: 4.46 MIL/uL (ref 3.87–5.11)
WBC: 9.1 10*3/uL (ref 4.0–10.5)

## 2013-07-29 LAB — RAPID URINE DRUG SCREEN, HOSP PERFORMED
Amphetamines: NOT DETECTED
Barbiturates: NOT DETECTED
Benzodiazepines: NOT DETECTED

## 2013-07-29 LAB — PREGNANCY, URINE: Preg Test, Ur: NEGATIVE

## 2013-07-29 MED ORDER — SERTRALINE HCL 25 MG PO TABS: 25.0000 mg | ORAL_TABLET | Freq: Every day | ORAL | Status: DC

## 2013-07-29 NOTE — ED Notes (Addendum)
Patient upset stating that she is being mistreated. Upon asking patient about why she feels that way, patient became somewhat verbally aggressive stating that the doctor said things about her kids being in danger. Patient feels that we are not helping her, but that we are causing her more harm. Patient currently denies SI/HI/AVH.

## 2013-07-29 NOTE — ED Provider Notes (Signed)
CSN: [id number]     Arrival date & time 07/29/13  1725 History   First MD Initiated Contact with Patient 07/29/13 1846     Chief Complaint  Patient presents with  . Suicidal    HPI Pt reports she has been feeling increased depression. She reports she had brief episode today where she thought she might overdose on ibuprofen, but that quickly passed and now denies SI/HI She has no recent h/o SI She denies drug abuse Her course is improving Stress worsens her symptoms.  She reports since recent delivery, she has not been back on her zoloft She lives at home with boyfriend and children.   Past Medical History  Diagnosis Date  . Sickle cell trait   . Mental disorder   . Bipolar affective disorder   . Bipolar affective disorder 2006    TOOK MEDS X 3 YR  . Depression     PP 2013  . Abnormal Pap smear     LAST PAP 06/2011  . Asthma     [facility]; [facility]  . Infection     UTI  . Anemia     CHRONIC  . Headache(784.0)     SEVERAL TIMES/DAY  . Hepatitis 2011    HEPATITIS B ????/States negative 11/23/12   Past Surgical History  Procedure Laterality Date  . Mouth surgery    . No past surgeries    . Wisdom tooth extraction  2012  . Vaginal delivery      X 3   Family History  Problem Relation Age of Onset  . Hypertension Mother   . Asthma Mother   . Heart disease Mother   . Hyperlipidemia Mother   . Thyroid disease Mother   . Alcohol abuse Father   . Asthma Sister   . Depression Sister   . Diabetes Maternal Aunt   . HIV Maternal Aunt   . Cancer Maternal Grandmother 37    COLON  . Depression Maternal Grandmother   . Alcohol abuse Maternal Grandmother   . Drug abuse Maternal Grandmother   . Alcohol abuse Maternal Grandfather   . Drug abuse Maternal Grandfather   . Stroke Maternal Grandfather   . Alcohol abuse Paternal Grandfather   . Drug abuse Paternal Grandfather   . Asthma Sister    History  Substance Use Topics  . Smoking status: Never Smoker   .  Smokeless tobacco: Never Used  . Alcohol Use: No   OB History   Grav Para Term Preterm Abortions TAB SAB Ect Mult Living   4 4 3 1  0 0 0 0 0 4     Review of Systems  Constitutional: Negative for fever.  Respiratory: Negative for shortness of breath.   Cardiovascular: Negative for chest pain.  Neurological: Negative for headaches.  Psychiatric/Behavioral: Negative for self-injury.  All other systems reviewed and are negative.    Allergies  Review of patient's allergies indicates no known allergies.  Home Medications   Current Outpatient Rx  Name  Route  Sig  Dispense  Refill  . sertraline (ZOLOFT) 25 MG tablet   Oral   Take 1 tablet (25 mg total) by mouth daily.   15 tablet   0    BP 112/76  Pulse 82  Temp(Src) 98 F (36.7 C) (Oral)  Resp 18  Ht 5\' 2"  (1.575 m)  Wt 211 lb 6.4 oz (95.89 kg)  BMI 38.66 kg/m2  SpO2 99%  LMP 06/08/2013 Physical Exam CONSTITUTIONAL: Well developed/well nourished HEAD: Normocephalic/atraumatic  EYES: EOMI/PERRL ENMT: Mucous membranes moist NECK: supple no meningeal signs SPINE:entire spine nontender CV: S1/S2 noted, no murmurs/rubs/gallops noted LUNGS: Lungs are clear to auscultation bilaterally, no apparent distress ABDOMEN: soft, nontender, no rebound or guarding GU:no cva tenderness NEURO: Pt is awake/alert, moves all extremitiesx4 EXTREMITIES: pulses normal, full ROM SKIN: warm, color normal PSYCH: no abnormalities of mood noted  ED Course  Procedures (including critical care time) Labs Review Labs Reviewed  CBC - Abnormal; Notable for the following:    HCT 35.4 (*)    All other components within normal limits  COMPREHENSIVE METABOLIC PANEL - Abnormal; Notable for the following:    Glucose, Bld 113 (*)    Total Bilirubin 0.2 (*)    All other components within normal limits  SALICYLATE LEVEL - Abnormal; Notable for the following:    Salicylate Lvl <2.0 (*)    All other components within normal limits  ETHANOL   ACETAMINOPHEN LEVEL  URINE RAPID DRUG SCREEN (HOSP PERFORMED)  PREGNANCY, URINE   Imaging Review No results found.  EKG Interpretation   None       MDM   1. Depression    Nursing notes including past medical history and social history reviewed and considered in documentation Labs/vital reviewed and considered  Initially pt had reported brief SI, but none now, she is appropriate with children and I feel she is safe for d/c home.  I don't feel she is in danger and her children are well cared for and appear safe at home   She just requests to go back on her zoloft, this was restarted and she was given outpatient referrals.  Pt is agreeable with plan.      Joya Gaskins, MD 07/29/13 2035

## 2013-07-29 NOTE — ED Notes (Signed)
Entered room to provide discharge teaching, patient calm and appropriate at this time. Still denies SI/HI/AVH. MD feels patient is ok for discharge with outpatient follow-up. Patient agrees and verbalized understanding of importance of follow-up.

## 2013-07-29 NOTE — ED Notes (Signed)
Pt is in room with 2 young children. Pt is caring for children appropriately. Pt states that earlier she felt like taking ibuprofen pills as a plan to end her life. Pt had insight to come to ED for help. Pt states she had this feeling about 30 days ago and she took ibuprofen then. (Pt is unsure of how many pills she took then.) Pt is currently in paper scrubs.

## 2013-07-29 NOTE — ED Notes (Addendum)
Pt states she has not taken her bi-polar medication since she delivered her baby 6 months ago.  She states she is feeling increasingly depressed and today had thoughts of overdosing on medication.  Her daughter is being seen in [place] (child is with her grandma) and pt has 2 of her children with her.  Belongings labeled and [facility] at [facility].

## 2013-08-03 ENCOUNTER — Emergency Department (HOSPITAL_COMMUNITY)
Admission: EM | Admit: 2013-08-03 | Discharge: 2013-08-03 | Disposition: A | Discharge status: Home / Self Care | Payer: Medicaid Other | Attending: Emergency Medicine | Admitting: Emergency Medicine

## 2013-08-03 ENCOUNTER — Encounter (HOSPITAL_COMMUNITY): Payer: Self-pay | Admitting: Emergency Medicine

## 2013-08-03 DIAGNOSIS — Z79899 Other long term (current) drug therapy: Secondary | ICD-10-CM | POA: Insufficient documentation

## 2013-08-03 DIAGNOSIS — F319 Bipolar disorder, unspecified: Secondary | ICD-10-CM | POA: Insufficient documentation

## 2013-08-03 DIAGNOSIS — Z3202 Encounter for pregnancy test, result negative: Secondary | ICD-10-CM | POA: Insufficient documentation

## 2013-08-03 DIAGNOSIS — A084 Viral intestinal infection, unspecified: Secondary | ICD-10-CM

## 2013-08-03 DIAGNOSIS — A088 Other specified intestinal infections: Secondary | ICD-10-CM | POA: Insufficient documentation

## 2013-08-03 DIAGNOSIS — Z862 Personal history of diseases of the blood and blood-forming organs and certain disorders involving the immune mechanism: Secondary | ICD-10-CM | POA: Insufficient documentation

## 2013-08-03 DIAGNOSIS — J45909 Unspecified asthma, uncomplicated: Secondary | ICD-10-CM | POA: Insufficient documentation

## 2013-08-03 DIAGNOSIS — Z87440 Personal history of urinary (tract) infections: Secondary | ICD-10-CM | POA: Insufficient documentation

## 2013-08-03 DIAGNOSIS — Z8719 Personal history of other diseases of the digestive system: Secondary | ICD-10-CM | POA: Insufficient documentation

## 2013-08-03 LAB — COMPREHENSIVE METABOLIC PANEL
ALT: 9 U/L (ref 0–35)
AST: 13 U/L (ref 0–37)
Albumin: 3.9 g/dL (ref 3.5–5.2)
Alkaline Phosphatase: 85 U/L (ref 39–117)
BUN: 6 mg/dL (ref 6–23)
Calcium: 9.2 mg/dL (ref 8.4–10.5)
Chloride: 108 mEq/L (ref 96–112)
Creatinine, Ser: 0.7 mg/dL (ref 0.50–1.10)
Potassium: 3.7 mEq/L (ref 3.5–5.1)
Sodium: 141 mEq/L (ref 135–145)
Total Bilirubin: 0.3 mg/dL (ref 0.3–1.2)

## 2013-08-03 LAB — CBC WITH DIFFERENTIAL/PLATELET
Basophils Absolute: 0 10*3/uL (ref 0.0–0.1)
Basophils Relative: 0 % (ref 0–1)
Eosinophils Relative: 2 % (ref 0–5)
HCT: 37.4 % (ref 36.0–46.0)
MCH: 27.2 pg (ref 26.0–34.0)
MCHC: 33.4 g/dL (ref 30.0–36.0)
Monocytes Relative: 8 % (ref 3–12)
Neutro Abs: 3.8 10*3/uL (ref 1.7–7.7)
Neutrophils Relative %: 71 % (ref 43–77)
Platelets: 282 10*3/uL (ref 150–400)
RDW: 13.6 % (ref 11.5–15.5)

## 2013-08-03 LAB — URINALYSIS, ROUTINE W REFLEX MICROSCOPIC
Glucose, UA: NEGATIVE mg/dL
Hgb urine dipstick: NEGATIVE
Ketones, ur: NEGATIVE mg/dL
Leukocytes, UA: NEGATIVE
Nitrite: NEGATIVE
Protein, ur: NEGATIVE mg/dL
pH: 5.5 (ref 5.0–8.0)

## 2013-08-03 LAB — LIPASE, BLOOD: Lipase: 11 U/L (ref 11–59)

## 2013-08-03 MED ORDER — SODIUM CHLORIDE 0.9 % IV BOLUS (SEPSIS)
1000.0000 mL | Freq: Once | INTRAVENOUS | Status: AC
  Administered 2013-08-03: 1000 mL via INTRAVENOUS

## 2013-08-03 MED ORDER — ONDANSETRON HCL 4 MG/2ML IJ SOLN
4.0000 mg | Freq: Once | INTRAMUSCULAR | Status: AC
  Administered 2013-08-03: 4 mg via INTRAVENOUS
  Filled 2013-08-03: qty 2

## 2013-08-03 MED ORDER — ONDANSETRON HCL 4 MG/2ML IJ SOLN: 4.0000 mg | Freq: Once | INTRAMUSCULAR | Status: DC

## 2013-08-03 MED ORDER — MORPHINE SULFATE 2 MG/ML IJ SOLN: 2.0000 mg | Freq: Once | INTRAMUSCULAR | Status: DC

## 2013-08-03 NOTE — ED Provider Notes (Signed)
CSN: [id number]     Arrival date & time 08/03/13  1056 History   First MD Initiated Contact with Patient 08/03/13 1105     Chief Complaint  Patient presents with  . Emesis  . Diarrhea   (Consider location/radiation/quality/duration/timing/severity/associated sxs/prior Treatment) Patient is a 27 y.o. female presenting with vomiting and diarrhea. The history is provided by the patient.  Emesis Severity:  Mild Duration:  2 days Timing:  Intermittent Number of daily episodes:  5 Quality:  Stomach contents Feeding tolerance: nothing. Progression:  Unchanged Chronicity:  New Recent urination:  Normal Context: not post-tussive and not self-induced   Relieved by:  None tried Worsened by:  Nothing tried Ineffective treatments:  None tried Associated symptoms: abdominal pain and diarrhea   Associated symptoms: no arthralgias, no chills, no cough, no fever, no headaches, no myalgias, no sore throat and no URI   Abdominal pain:    Location:  LLQ and RLQ   Quality:  Cramping   Severity:  Mild   Onset quality:  Gradual   Duration:  3 days   Timing:  Intermittent   Progression:  Unchanged   Chronicity:  New Diarrhea:    Quality:  Watery (non bloody)   Number of occurrences:  8-10   Severity:  Mild   Duration:  3 days   Timing:  Intermittent   Progression:  Unchanged Risk factors: sick contacts ( son with similar symptoms day before onset for patient)   Risk factors: no prior abdominal surgery, no suspect food intake and no travel to endemic areas   Diarrhea Associated symptoms: abdominal pain and vomiting   Associated symptoms: no arthralgias, no chills, no recent cough, no fever, no headaches, no myalgias and no URI     Past Medical History  Diagnosis Date  . Sickle cell trait   . Mental disorder   . Bipolar affective disorder   . Bipolar affective disorder 2006    TOOK MEDS X 3 YR  . Depression     PP 2013  . Abnormal Pap smear     LAST PAP 06/2011  . Asthma    [facility]; [facility]  . Infection     UTI  . Anemia     CHRONIC  . Headache(784.0)     SEVERAL TIMES/DAY  . Hepatitis 2011    HEPATITIS B ????/States negative 11/23/12   Past Surgical History  Procedure Laterality Date  . Mouth surgery    . No past surgeries    . Wisdom tooth extraction  2012  . Vaginal delivery      X 3   Family History  Problem Relation Age of Onset  . Hypertension Mother   . Asthma Mother   . Heart disease Mother   . Hyperlipidemia Mother   . Thyroid disease Mother   . Alcohol abuse Father   . Asthma Sister   . Depression Sister   . Diabetes Maternal Aunt   . HIV Maternal Aunt   . Cancer Maternal Grandmother 37    COLON  . Depression Maternal Grandmother   . Alcohol abuse Maternal Grandmother   . Drug abuse Maternal Grandmother   . Alcohol abuse Maternal Grandfather   . Drug abuse Maternal Grandfather   . Stroke Maternal Grandfather   . Alcohol abuse Paternal Grandfather   . Drug abuse Paternal Grandfather   . Asthma Sister    History  Substance Use Topics  . Smoking status: Never Smoker   . Smokeless tobacco: Never Used  .  Alcohol Use: No   OB History   Grav Para Term Preterm Abortions TAB SAB Ect Mult Living   4 4 3 1  0 0 0 0 0 4     Review of Systems  Constitutional: Negative for fever and chills.  HENT: Negative for ear pain, rhinorrhea and sore throat.   Eyes: Negative for visual disturbance.  Respiratory: Negative for cough and shortness of breath.   Cardiovascular: Negative for chest pain.  Gastrointestinal: Positive for nausea, vomiting, abdominal pain and diarrhea.  Genitourinary: Negative for dysuria.  Musculoskeletal: Negative for arthralgias and myalgias.  Skin: Negative for rash.  Neurological: Negative for headaches.  Hematological: Negative for adenopathy.  Psychiatric/Behavioral: Negative for agitation.    Allergies  Review of patient's allergies indicates no known allergies.  Home Medications   Current  Outpatient Rx  Name  Route  Sig  Dispense  Refill  . albuterol (PROVENTIL HFA;VENTOLIN HFA) 108 (90 BASE) MCG/ACT inhaler   Inhalation   Inhale 2 puffs into the lungs every 6 (six) hours as needed for wheezing or shortness of breath.         Marland Kitchen ibuprofen (ADVIL,MOTRIN) 200 MG tablet   Oral   Take 400 mg by mouth every 6 (six) hours as needed for moderate pain.         Marland Kitchen sertraline (ZOLOFT) 25 MG tablet   Oral   Take 1 tablet (25 mg total) by mouth daily.   15 tablet   0    BP 106/53  Pulse 65  Temp(Src) 98.2 F (36.8 C) (Oral)  Resp 18  Ht 5\' 2"  (1.575 m)  Wt 208 lb 14.4 oz (94.756 kg)  BMI 38.20 kg/m2  SpO2 100%  LMP 06/08/2013 Physical Exam  Nursing note and vitals reviewed. Constitutional: She is oriented to person, place, and time. She appears well-developed and well-nourished.  HENT:  Head: Normocephalic and atraumatic.  Right Ear: External ear normal.  Left Ear: External ear normal.  Mouth/Throat: Oropharynx is clear and moist.  Eyes: Conjunctivae and EOM are normal.  Neck: Normal range of motion. Neck supple.  Cardiovascular: Normal rate, regular rhythm, normal heart sounds and intact distal pulses.   Pulmonary/Chest: Effort normal and breath sounds normal. She exhibits no tenderness.  Abdominal: Soft. Bowel sounds are normal. She exhibits no distension. There is no tenderness. There is no rebound and no guarding.  Musculoskeletal: Normal range of motion.  Neurological: She is alert and oriented to person, place, and time.  Skin: Skin is warm and dry.  Psychiatric: She has a normal mood and affect.    ED Course  Procedures (including critical care time) Labs Review Labs Reviewed  COMPREHENSIVE METABOLIC PANEL - Abnormal; Notable for the following:    Glucose, Bld 111 (*)    All other components within normal limits  CBC WITH DIFFERENTIAL  LIPASE, BLOOD  URINALYSIS, ROUTINE W REFLEX MICROSCOPIC  POCT PREGNANCY, URINE   Imaging Review No results  found.  EKG Interpretation   None       MDM   1. Viral gastroenteritis    27 yo female with acute N/V/D x 3 days. No report to me of bloody diarrhea nor emesis. Child with similar symptoms this week as well. Afebrile and nontoxic appearing. Abd soft with no focal tenderness, no evidence to suggest acute intra-abdominal pathology such as appendicitis nor diverticulitis, no emergent imaging indicated. Labs WNL, no leukocytosis, normal lipase--doubt pancreatitis. UA reveals no evidence of UTI. After hydration and zofran, patient able to  tolerate oral crackers and soda with no further vomiting/diarrhea. Felt stable for d/c. Strict return instructions discussed and provided to the patient in writing at time of d/c.      Simmie Davies, NP 08/03/13 1332

## 2013-08-03 NOTE — ED Provider Notes (Signed)
Medical screening examination/treatment/procedure(s) were performed by non-physician practitioner and as supervising physician I was immediately available for consultation/collaboration.  EKG Interpretation   None        Doug Sou, MD 08/03/13 2048

## 2013-08-03 NOTE — ED Notes (Signed)
Pt reports n/v/d x 3 days and now vomiting blood.

## 2013-09-30 ENCOUNTER — Other Ambulatory Visit: Payer: Self-pay | Admitting: Obstetrics and Gynecology

## 2013-09-30 ENCOUNTER — Ambulatory Visit (HOSPITAL_COMMUNITY)
Admission: RE | Admit: 2013-09-30 | Discharge: 2013-09-30 | Disposition: A | Discharge status: Home / Self Care | Payer: Medicaid Other | Source: Ambulatory Visit | Attending: Obstetrics and Gynecology | Admitting: Obstetrics and Gynecology

## 2013-09-30 DIAGNOSIS — N949 Unspecified condition associated with female genital organs and menstrual cycle: Secondary | ICD-10-CM | POA: Insufficient documentation

## 2013-09-30 DIAGNOSIS — O34599 Maternal care for other abnormalities of gravid uterus, unspecified trimester: Secondary | ICD-10-CM | POA: Insufficient documentation

## 2013-09-30 DIAGNOSIS — O99891 Other specified diseases and conditions complicating pregnancy: Secondary | ICD-10-CM | POA: Insufficient documentation

## 2013-09-30 DIAGNOSIS — D251 Intramural leiomyoma of uterus: Secondary | ICD-10-CM | POA: Insufficient documentation

## 2013-09-30 DIAGNOSIS — O208 Other hemorrhage in early pregnancy: Secondary | ICD-10-CM | POA: Insufficient documentation

## 2013-09-30 DIAGNOSIS — R102 Pelvic and perineal pain: Secondary | ICD-10-CM

## 2013-09-30 DIAGNOSIS — O3410 Maternal care for benign tumor of corpus uteri, unspecified trimester: Secondary | ICD-10-CM | POA: Insufficient documentation

## 2013-09-30 DIAGNOSIS — O9989 Other specified diseases and conditions complicating pregnancy, childbirth and the puerperium: Principal | ICD-10-CM

## 2013-09-30 DIAGNOSIS — N831 Corpus luteum cyst of ovary, unspecified side: Secondary | ICD-10-CM | POA: Insufficient documentation

## 2013-10-08 ENCOUNTER — Emergency Department: Payer: Self-pay | Admitting: Emergency Medicine

## 2013-10-08 LAB — CBC WITH DIFFERENTIAL/PLATELET
BASOS ABS: 0 10*3/uL (ref 0.0–0.1)
BASOS PCT: 0.1 %
EOS ABS: 0 10*3/uL (ref 0.0–0.7)
Eosinophil %: 0.4 %
HCT: 34.1 % — ABNORMAL LOW (ref 35.0–47.0)
HGB: 11.4 g/dL — ABNORMAL LOW (ref 12.0–16.0)
LYMPHS ABS: 1.1 10*3/uL (ref 1.0–3.6)
Lymphocyte %: 11 %
MCH: 27.2 pg (ref 26.0–34.0)
MCHC: 33.5 g/dL (ref 32.0–36.0)
MCV: 81 fL (ref 80–100)
MONO ABS: 1 x10 3/mm — AB (ref 0.2–0.9)
Monocyte %: 9.7 %
NEUTROS ABS: 8 10*3/uL — AB (ref 1.4–6.5)
Neutrophil %: 78.8 %
Platelet: 242 10*3/uL (ref 150–440)
RBC: 4.19 10*6/uL (ref 3.80–5.20)
RDW: 13.9 % (ref 11.5–14.5)
WBC: 10.1 10*3/uL (ref 3.6–11.0)

## 2013-10-08 LAB — HCG, QUANTITATIVE, PREGNANCY: BETA HCG, QUANT.: 95243 m[IU]/mL — AB

## 2013-10-08 LAB — URINALYSIS, COMPLETE
BACTERIA: NONE SEEN
GLUCOSE, UR: NEGATIVE mg/dL (ref 0–75)
Ketone: NEGATIVE
LEUKOCYTE ESTERASE: NEGATIVE
Nitrite: NEGATIVE
PH: 5 (ref 4.5–8.0)
Protein: NEGATIVE
RBC,UR: 1 /HPF (ref 0–5)
Specific Gravity: 1.015 (ref 1.003–1.030)
Squamous Epithelial: 1

## 2013-10-09 DIAGNOSIS — O021 Missed abortion: Secondary | ICD-10-CM

## 2013-10-09 HISTORY — DX: Missed abortion: O02.1

## 2013-10-31 ENCOUNTER — Inpatient Hospital Stay (HOSPITAL_COMMUNITY)
Admission: AD | Admit: 2013-10-31 | Discharge: 2013-10-31 | Disposition: A | Discharge status: Home / Self Care | Payer: Medicaid Other | Source: Ambulatory Visit | Attending: Obstetrics and Gynecology | Admitting: Obstetrics and Gynecology

## 2013-10-31 ENCOUNTER — Encounter (HOSPITAL_COMMUNITY): Payer: Self-pay | Admitting: *Deleted

## 2013-10-31 DIAGNOSIS — O99019 Anemia complicating pregnancy, unspecified trimester: Secondary | ICD-10-CM | POA: Insufficient documentation

## 2013-10-31 DIAGNOSIS — F319 Bipolar disorder, unspecified: Secondary | ICD-10-CM | POA: Insufficient documentation

## 2013-10-31 DIAGNOSIS — M545 Low back pain, unspecified: Secondary | ICD-10-CM | POA: Insufficient documentation

## 2013-10-31 DIAGNOSIS — O9989 Other specified diseases and conditions complicating pregnancy, childbirth and the puerperium: Principal | ICD-10-CM

## 2013-10-31 DIAGNOSIS — O99891 Other specified diseases and conditions complicating pregnancy: Secondary | ICD-10-CM | POA: Insufficient documentation

## 2013-10-31 DIAGNOSIS — D573 Sickle-cell trait: Secondary | ICD-10-CM | POA: Insufficient documentation

## 2013-10-31 DIAGNOSIS — W010XXA Fall on same level from slipping, tripping and stumbling without subsequent striking against object, initial encounter: Secondary | ICD-10-CM | POA: Insufficient documentation

## 2013-10-31 DIAGNOSIS — Y9329 Activity, other involving ice and snow: Secondary | ICD-10-CM | POA: Insufficient documentation

## 2013-10-31 LAB — URINALYSIS, ROUTINE W REFLEX MICROSCOPIC
BILIRUBIN URINE: NEGATIVE
GLUCOSE, UA: NEGATIVE mg/dL
KETONES UR: NEGATIVE mg/dL
Nitrite: NEGATIVE
PH: 6 (ref 5.0–8.0)
Protein, ur: NEGATIVE mg/dL
Specific Gravity, Urine: 1.02 (ref 1.005–1.030)
Urobilinogen, UA: 0.2 mg/dL (ref 0.0–1.0)

## 2013-10-31 LAB — POCT PREGNANCY, URINE: Preg Test, Ur: POSITIVE — AB

## 2013-10-31 LAB — URINE MICROSCOPIC-ADD ON

## 2013-10-31 NOTE — Discharge Instructions (Signed)
Pregnancy - First Trimester  During sexual intercourse, millions of sperm go into the vagina. Only 1 sperm will penetrate and fertilize the female egg while it is in the Fallopian tube. One week later, the fertilized egg implants into the wall of the uterus. An embryo begins to develop into a baby. At 6 to 8 weeks, the eyes and face are formed and the heartbeat can be seen on ultrasound. At the end of 12 weeks (first trimester), all the baby's organs are formed. Now that you are pregnant, you will want to do everything you can to have a healthy baby. Two of the most important things are to get good prenatal care and follow your caregiver's instructions. Prenatal care is all the medical care you receive before the baby's birth. It is given to prevent, find, and treat problems during the pregnancy and childbirth.  PRENATAL EXAMS  · During prenatal visits, your weight, blood pressure, and urine are checked. This is done to make sure you are healthy and progressing normally during the pregnancy.  · A pregnant woman should gain 25 to 35 pounds during the pregnancy. However, if you are overweight or underweight, your caregiver will advise you regarding your weight.  · Your caregiver will ask and answer questions for you.  · Blood work, cervical cultures, other necessary tests, and a Pap test are done during your prenatal exams. These tests are done to check on your health and the probable health of your baby. Tests are strongly recommended and done for HIV with your permission. This is the virus that causes AIDS. These tests are done because medicines can be given to help prevent your baby from being born with this infection should you have been infected without knowing it. Blood work is also used to find out your blood type, previous infections, and follow your blood levels (hemoglobin).  · Low hemoglobin (anemia) is common during pregnancy. Iron and vitamins are given to help prevent this. Later in the pregnancy, blood  tests for diabetes will be done along with any other tests if any problems develop.  · You may need other tests to make sure you and the baby are doing well.  CHANGES DURING THE FIRST TRIMESTER   Your body goes through many changes during pregnancy. They vary from person to person. Talk to your caregiver about changes you notice and are concerned about. Changes can include:  · Your menstrual period stops.  · The egg and sperm carry the genes that determine what you look like. Genes from you and your partner are forming a baby. The female genes determine whether the baby is a boy or a girl.  · Your body increases in girth and you may feel bloated.  · Feeling sick to your stomach (nauseous) and throwing up (vomiting). If the vomiting is uncontrollable, call your caregiver.  · Your breasts will begin to enlarge and become tender.  · Your nipples may stick out more and become darker.  · The need to urinate more. Painful urination may mean you have a bladder infection.  · Tiring easily.  · Loss of appetite.  · Cravings for certain kinds of food.  · At first, you may gain or lose a couple of pounds.  · You may have changes in your emotions from day to day (excited to be pregnant or concerned something may go wrong with the pregnancy and baby).  · You may have more vivid and strange dreams.  HOME CARE INSTRUCTIONS   ·   It is very important to avoid all smoking, alcohol and non-prescribed drugs during your pregnancy. These affect the formation and growth of the baby. Avoid chemicals while pregnant to ensure the delivery of a healthy infant.  · Start your prenatal visits by the 12th week of pregnancy. They are usually scheduled monthly at first, then more often in the last 2 months before delivery. Keep your caregiver's appointments. Follow your caregiver's instructions regarding medicine use, blood and lab tests, exercise, and diet.  · During pregnancy, you are providing food for you and your baby. Eat regular, well-balanced  meals. Choose foods such as meat, fish, milk and other low fat dairy products, vegetables, fruits, and whole-grain breads and cereals. Your caregiver will tell you of the ideal weight gain.  · You can help morning sickness by keeping soda crackers at the bedside. Eat a couple before arising in the morning. You may want to use the crackers without salt on them.  · Eating 4 to 5 small meals rather than 3 large meals a day also may help the nausea and vomiting.  · Drinking liquids between meals instead of during meals also seems to help nausea and vomiting.  · A physical sexual relationship may be continued throughout pregnancy if there are no other problems. Problems may be early (premature) leaking of amniotic fluid from the membranes, vaginal bleeding, or belly (abdominal) pain.  · Exercise regularly if there are no restrictions. Check with your caregiver or physical therapist if you are unsure of the safety of some of your exercises. Greater weight gain will occur in the last 2 trimesters of pregnancy. Exercising will help:  · Control your weight.  · Keep you in shape.  · Prepare you for labor and delivery.  · Help you lose your pregnancy weight after you deliver your baby.  · Wear a good support or jogging bra for breast tenderness during pregnancy. This may help if worn during sleep too.  · Ask when prenatal classes are available. Begin classes when they are offered.  · Do not use hot tubs, steam rooms, or saunas.  · Wear your seat belt when driving. This protects you and your baby if you are in an accident.  · Avoid raw meat, uncooked cheese, cat litter boxes, and soil used by cats throughout the pregnancy. These carry germs that can cause birth defects in the baby.  · The first trimester is a good time to visit your dentist for your dental health. Getting your teeth cleaned is okay. Use a softer toothbrush and brush gently during pregnancy.  · Ask for help if you have financial, counseling, or nutritional needs  during pregnancy. Your caregiver will be able to offer counseling for these needs as well as refer you for other special needs.  · Do not take any medicines or herbs unless told by your caregiver.  · Inform your caregiver if there is any mental or physical domestic violence.  · Make a list of emergency phone numbers of family, friends, hospital, and police and fire departments.  · Write down your questions. Take them to your prenatal visit.  · Do not douche.  · Do not cross your legs.  · If you have to stand for long periods of time, rotate you feet or take small steps in a circle.  · You may have more vaginal secretions that may require a sanitary pad. Do not use tampons or scented sanitary pads.  MEDICINES AND DRUG USE IN PREGNANCY  ·   Take prenatal vitamins as directed. The vitamin should contain 1 milligram of folic acid. Keep all vitamins out of reach of children. Only a couple vitamins or tablets containing iron may be fatal to a baby or young child when ingested.  · Avoid use of all medicines, including herbs, over-the-counter medicines, not prescribed or suggested by your caregiver. Only take over-the-counter or prescription medicines for pain, discomfort, or fever as directed by your caregiver. Do not use aspirin, ibuprofen, or naproxen unless directed by your caregiver.  · Let your caregiver also know about herbs you may be using.  · Alcohol is related to a number of birth defects. This includes fetal alcohol syndrome. All alcohol, in any form, should be avoided completely. Smoking will cause low birth rate and premature babies.  · Street or illegal drugs are very harmful to the baby. They are absolutely forbidden. A baby born to an addicted mother will be addicted at birth. The baby will go through the same withdrawal an adult does.  · Let your caregiver know about any medicines that you have to take and for what reason you take them.  SEEK MEDICAL CARE IF:   You have any concerns or worries during your  pregnancy. It is better to call with your questions if you feel they cannot wait, rather than worry about them.  SEEK IMMEDIATE MEDICAL CARE IF:   · An unexplained oral temperature above 102° F (38.9° C) develops, or as your caregiver suggests.  · You have leaking of fluid from the vagina (birth canal). If leaking membranes are suspected, take your temperature and inform your caregiver of this when you call.  · There is vaginal spotting or bleeding. Notify your caregiver of the amount and how many pads are used.  · You develop a bad smelling vaginal discharge with a change in the color.  · You continue to feel sick to your stomach (nauseated) and have no relief from remedies suggested. You vomit blood or coffee ground-like materials.  · You lose more than 2 pounds of weight in 1 week.  · You gain more than 2 pounds of weight in 1 week and you notice swelling of your face, hands, feet, or legs.  · You gain 5 pounds or more in 1 week (even if you do not have swelling of your hands, face, legs, or feet).  · You get exposed to German measles and have never had them.  · You are exposed to fifth disease or chickenpox.  · You develop belly (abdominal) pain. Round ligament discomfort is a common non-cancerous (benign) cause of abdominal pain in pregnancy. Your caregiver still must evaluate this.  · You develop headache, fever, diarrhea, pain with urination, or shortness of breath.  · You fall or are in a car accident or have any kind of trauma.  · There is mental or physical violence in your home.  Document Released: 08/19/2001 Document Revised: 05/19/2012 Document Reviewed: 02/20/2009  [employer]® Patient Information ©2014 [employer].

## 2013-10-31 NOTE — MAU Provider Note (Signed)
History     CSN: [id number]  Arrival date and time: 10/31/13 1757   None     Chief Complaint  Patient presents with  . Fall   HPI Comments: Pt is a 28yo G5P3 at [redacted]w[redacted]d arrives unannounced w c/o low back pain since falling on the ice on Saturday. Pt states she fell onto her bottom and back, had some pain and worsened today, worse with walking. Denies any bleeding or abdominal pain. Had an [place] earlier in preg. Has not tried anything for the pain.   Fall Pertinent negatives include no abdominal pain.      Past Medical History  Diagnosis Date  . Sickle cell trait   . Mental disorder   . Bipolar affective disorder   . Bipolar affective disorder 2006    TOOK MEDS X 3 YR  . Depression     PP 2013  . Abnormal Pap smear     LAST PAP 06/2011  . Asthma     [facility]; [facility]  . Infection     UTI  . Anemia     CHRONIC  . Headache(784.0)     SEVERAL TIMES/DAY  . Hepatitis 2011    HEPATITIS B ????/States negative 11/23/12    Past Surgical History  Procedure Laterality Date  . Mouth surgery    . No past surgeries    . Wisdom tooth extraction  2012  . Vaginal delivery      X 3    Family History  Problem Relation Age of Onset  . Hypertension Mother   . Asthma Mother   . Heart disease Mother   . Hyperlipidemia Mother   . Thyroid disease Mother   . Alcohol abuse Father   . Asthma Sister   . Depression Sister   . Diabetes Maternal Aunt   . HIV Maternal Aunt   . Cancer Maternal Grandmother 37    COLON  . Depression Maternal Grandmother   . Alcohol abuse Maternal Grandmother   . Drug abuse Maternal Grandmother   . Alcohol abuse Maternal Grandfather   . Drug abuse Maternal Grandfather   . Stroke Maternal Grandfather   . Alcohol abuse Paternal Grandfather   . Drug abuse Paternal Grandfather   . Asthma Sister     History  Substance Use Topics  . Smoking status: Never Smoker   . Smokeless tobacco: Never Used  . Alcohol Use: No    Allergies: No Known  Allergies  No prescriptions prior to admission    Review of Systems  Gastrointestinal: Negative for abdominal pain.  Musculoskeletal: Positive for back pain.   Physical Exam   Blood pressure 131/68, pulse 70, temperature 97.8 F (36.6 C), resp. rate 18, height 5\' 2"  (1.575 m), weight 203 lb (92.08 kg), last menstrual period 08/08/2013.  Physical Exam  Nursing note and vitals reviewed. Constitutional: She is oriented to person, place, and time. She appears well-developed and well-nourished. No distress.  Cardiovascular: Normal rate.   Respiratory: Effort normal.  GI: Soft.  Genitourinary:  Deferred   Musculoskeletal: Normal range of motion. She exhibits no edema and no tenderness.  Neurological: She is alert and oriented to person, place, and time. She has normal reflexes.  Skin: Skin is warm and dry.  Psychiatric: She has a normal mood and affect. Her behavior is normal.    MAU Course  Procedures    Assessment and Plan  IUP at [redacted]w[redacted]d [facility] confirms FHT's 160's, pt reassured Instructed to take tylenol 1000mg  q4-6h Increase PO  hydration, rest as needed D/c home stable condition  F/u office routine   Mayra Brahm M 10/31/2013, 7:56 PM

## 2013-10-31 NOTE — MAU Note (Signed)
Pt presents with complaints of falling this Saturday on ice in her yard. She states she fell on her buttocks and her lower back. She says now her lower back is hurting.

## 2013-11-03 ENCOUNTER — Inpatient Hospital Stay (HOSPITAL_COMMUNITY)
Admission: AD | Admit: 2013-11-03 | Discharge: 2013-11-03 | Disposition: A | Discharge status: Home / Self Care | Payer: Medicaid Other | Source: Ambulatory Visit | Attending: Obstetrics and Gynecology | Admitting: Obstetrics and Gynecology

## 2013-11-03 ENCOUNTER — Inpatient Hospital Stay (HOSPITAL_COMMUNITY): Payer: Medicaid Other

## 2013-11-03 ENCOUNTER — Encounter (HOSPITAL_COMMUNITY): Payer: Self-pay | Admitting: *Deleted

## 2013-11-03 DIAGNOSIS — O021 Missed abortion: Secondary | ICD-10-CM | POA: Insufficient documentation

## 2013-11-03 DIAGNOSIS — F319 Bipolar disorder, unspecified: Secondary | ICD-10-CM | POA: Insufficient documentation

## 2013-11-03 DIAGNOSIS — J45909 Unspecified asthma, uncomplicated: Secondary | ICD-10-CM | POA: Insufficient documentation

## 2013-11-03 DIAGNOSIS — D573 Sickle-cell trait: Secondary | ICD-10-CM | POA: Insufficient documentation

## 2013-11-03 LAB — CBC
HCT: 33.6 % — ABNORMAL LOW (ref 36.0–46.0)
Hemoglobin: 11.3 g/dL — ABNORMAL LOW (ref 12.0–15.0)
MCH: 26.8 pg (ref 26.0–34.0)
MCHC: 33.6 g/dL (ref 30.0–36.0)
MCV: 79.6 fL (ref 78.0–100.0)
PLATELETS: 251 10*3/uL (ref 150–400)
RBC: 4.22 MIL/uL (ref 3.87–5.11)
RDW: 13.3 % (ref 11.5–15.5)
WBC: 5.4 10*3/uL (ref 4.0–10.5)

## 2013-11-03 LAB — ABO/RH: ABO/RH(D): O POS

## 2013-11-03 NOTE — MAU Provider Note (Signed)
History     CSN: [id number]  Arrival date and time: 11/03/13 1531   None     Chief Complaint  Patient presents with  . Abdominal Pain  . Vaginal Bleeding   HPI Pt is early pregnant and came in with spotting.  No heavy bleeding.  She just put on a pad.    OB History   Grav Para Term Preterm Abortions TAB SAB Ect Mult Living   5 4 3 1  0 0 0 0 0 4      Past Medical History  Diagnosis Date  . Sickle cell trait   . Mental disorder   . Bipolar affective disorder   . Bipolar affective disorder 2006    TOOK MEDS X 3 YR  . Depression     PP 2013  . Abnormal Pap smear     LAST PAP 06/2011  . Asthma     [facility]; [facility]  . Infection     UTI  . Anemia     CHRONIC  . Headache(784.0)     SEVERAL TIMES/DAY  . Hepatitis 2011    HEPATITIS B ????/States negative 11/23/12    Past Surgical History  Procedure Laterality Date  . Mouth surgery    . No past surgeries    . Wisdom tooth extraction  2012  . Vaginal delivery      X 3    Family History  Problem Relation Age of Onset  . Hypertension Mother   . Asthma Mother   . Heart disease Mother   . Hyperlipidemia Mother   . Thyroid disease Mother   . Alcohol abuse Father   . Asthma Sister   . Depression Sister   . Diabetes Maternal Aunt   . HIV Maternal Aunt   . Cancer Maternal Grandmother 37    COLON  . Depression Maternal Grandmother   . Alcohol abuse Maternal Grandmother   . Drug abuse Maternal Grandmother   . Alcohol abuse Maternal Grandfather   . Drug abuse Maternal Grandfather   . Stroke Maternal Grandfather   . Alcohol abuse Paternal Grandfather   . Drug abuse Paternal Grandfather   . Asthma Sister     History  Substance Use Topics  . Smoking status: Never Smoker   . Smokeless tobacco: Never Used  . Alcohol Use: No    Allergies: No Known Allergies  No prescriptions prior to admission    ROS Physical Exam   Blood pressure 119/70, pulse 77, temperature 98.8 F (37.1 C), temperature source  Oral, resp. rate 18, height 5' 0.5" (1.537 m), weight 203 lb (92.08 kg), last menstrual period 08/08/2013.  Physical Exam Physical Examination: General appearance - alert, well appearing, and in no distress Chest - clear to auscultation, no wheezes, rales or rhonchi, symmetric air entry Heart - normal rate and regular rhythm Abdomen - soft, nontender, nondistended, no masses or organomegaly Pelvic - VULVA: normal appearing vulva with no masses, tenderness or lesions, VAGINA: normal in appearance with mild VB, CERVIX: normal appearing cervix without discharge or lesions, cx is closed, UTERUS: enlarged to 8 week's size, ADNEXA: normal adnexa in size, nontender and no masses Extremities - peripheral pulses normal, no pedal edema, no clubbing or cyanosis   MAU Course  Procedures  MDM Recent Results (from the past 2160 hour(s))  URINALYSIS, ROUTINE W REFLEX MICROSCOPIC     Status: Abnormal   Collection Time    10/31/13  6:40 PM      Result Value Ref Range  Color, Urine YELLOW  YELLOW   APPearance CLEAR  CLEAR   Specific Gravity, Urine 1.020  1.005 - 1.030   pH 6.0  5.0 - 8.0   Glucose, UA NEGATIVE  NEGATIVE mg/dL   Hgb urine dipstick TRACE (*) NEGATIVE   Bilirubin Urine NEGATIVE  NEGATIVE   Ketones, ur NEGATIVE  NEGATIVE mg/dL   Protein, ur NEGATIVE  NEGATIVE mg/dL   Urobilinogen, UA 0.2  0.0 - 1.0 mg/dL   Nitrite NEGATIVE  NEGATIVE   Leukocytes, UA TRACE (*) NEGATIVE  URINE MICROSCOPIC-ADD ON     Status: Abnormal   Collection Time    10/31/13  6:40 PM      Result Value Ref Range   Squamous Epithelial / LPF FEW (*) RARE   WBC, UA 3-6  <3 WBC/hpf   RBC / HPF 3-6  <3 RBC/hpf   Bacteria, UA FEW (*) RARE  POCT PREGNANCY, URINE     Status: Abnormal   Collection Time    10/31/13  6:50 PM      Result Value Ref Range   Preg Test, Ur POSITIVE (*) NEGATIVE   Comment:            THE SENSITIVITY OF THIS     METHODOLOGY IS >24 mIU/mL  CBC     Status: Abnormal   Collection Time     11/03/13  4:26 PM      Result Value Ref Range   WBC 5.4  4.0 - 10.5 K/uL   RBC 4.22  3.87 - 5.11 MIL/uL   Hemoglobin 11.3 (*) 12.0 - 15.0 g/dL   HCT 33.6 (*) 36.0 - 46.0 %   MCV 79.6  78.0 - 100.0 fL   MCH 26.8  26.0 - 34.0 pg   MCHC 33.6  30.0 - 36.0 g/dL   RDW 13.3  11.5 - 15.5 %   Platelets 251  150 - 400 K/uL  ABO/RH     Status: None   Collection Time    11/03/13  4:30 PM      Result Value Ref Range   ABO/RH(D) O POS       Assessment and Plan  Missed Abortion Pt unsure if she desires observation vs D&E Both options reviewed with the pt She will call the office tomorrow with her decision Bleeding precautions given to the pt  Ramona Ruark A 11/03/2013, 6:53 PM

## 2013-11-03 NOTE — Progress Notes (Signed)
Small amount of vaginal bleeding noted

## 2013-11-03 NOTE — MAU Note (Signed)
Went to the bathroom and saw blood when wiped,  Started having pain in lower abd after seeing the blood. (about an hour ago)

## 2013-11-03 NOTE — Discharge Instructions (Signed)
Incomplete Miscarriage A miscarriage is the sudden loss of an unborn baby (fetus) before the 20th week of pregnancy. In an incomplete miscarriage, parts of the fetus or placenta (afterbirth) remain in the body.  Having a miscarriage can be an emotional experience. Talk with your health care provider about any questions you may have about miscarrying, the grieving process, and your future pregnancy plans. CAUSES   Problems with the fetal chromosomes that make it impossible for the baby to develop normally. Problems with the baby's genes or chromosomes are most often the result of errors that occur by chance as the embryo divides and grows. The problems are not inherited from the parents.  Infection of the cervix or uterus.  Hormone problems.  Problems with the cervix, such as having an incompetent cervix. This is when the tissue in the cervix is not strong enough to hold the pregnancy.  Problems with the uterus, such as an abnormally shaped uterus, uterine fibroids, or congenital abnormalities.  Certain medical conditions.  Smoking, drinking alcohol, or taking illegal drugs.  Trauma. SYMPTOMS   Vaginal bleeding or spotting, with or without cramps or pain.  Pain or cramping in the abdomen or lower back.  Passing fluid, tissue, or blood clots from the vagina. DIAGNOSIS  Your health care provider will perform a physical exam. You may also have an ultrasound to confirm the miscarriage. Blood or urine tests may also be ordered. TREATMENT   Usually, a dilation and curettage (D&C) procedure is performed. During a D&C procedure, the cervix is widened (dilated) and any remaining fetal or placental tissue is gently removed from the uterus.  Antibiotic medicines are prescribed if there is an infection. Other medicines may be given to reduce the size of the uterus (contract) if there is a lot of bleeding.  If you have Rh negative blood and your baby was Rh positive, you will need an Rho(D)  immune globulin shot. This shot will protect any future baby from having Rh blood problems in future pregnancies.  You may be confined to bed rest. This means you should stay in bed and only get up to use the bathroom. HOME CARE INSTRUCTIONS   Rest as directed by your health care provider.  Restrict activity as directed by your health care provider. You may be allowed to continue light activity if curettage was not done but you require further treatment.  Keep track of the number of pads you use each day. Keep track of how soaked (saturated) they are. Record this information.  Do not  use tampons.  Do not douche or have sexual intercourse until approved by your health care provider.  Keep all follow-up appointments for re-evaluation and continuing management.  Only take over-the-counter or prescription medicines for pain, fever, or discomfort as directed by your health care provider.  Take antibiotic medicine as directed by your health care provider. Make sure you finish it even if you start to feel better. SEEK IMMEDIATE MEDICAL CARE IF:   You experience severe cramps in your stomach, back, or abdomen.  You have an unexplained temperature (make sure to record these temperatures).  You pass large clots or tissue (save these for your health care provider to inspect).  Your bleeding increases.  You become light-headed, weak, or have fainting episodes. MAKE SURE YOU:   Understand these instructions.  Will watch your condition.  Will get help right away if you are not doing well or get worse. Document Released: 08/25/2005 Document Revised: 06/15/2013 Document Reviewed: 03/24/2013  Exit[employer] Patient Information 2014 [employer].

## 2013-11-04 ENCOUNTER — Ambulatory Visit (HOSPITAL_COMMUNITY): Payer: Medicaid Other | Admitting: Anesthesiology

## 2013-11-04 ENCOUNTER — Ambulatory Visit (HOSPITAL_COMMUNITY)
Admission: AD | Admit: 2013-11-04 | Discharge: 2013-11-04 | Disposition: A | Discharge status: Home / Self Care | Payer: Medicaid Other | Source: Ambulatory Visit | Attending: Obstetrics and Gynecology | Admitting: Obstetrics and Gynecology

## 2013-11-04 ENCOUNTER — Encounter (HOSPITAL_COMMUNITY)
Admission: AD | Disposition: A | Discharge status: Home / Self Care | Payer: Self-pay | Source: Ambulatory Visit | Attending: Obstetrics and Gynecology

## 2013-11-04 ENCOUNTER — Other Ambulatory Visit: Payer: Self-pay | Admitting: Obstetrics and Gynecology

## 2013-11-04 ENCOUNTER — Encounter (HOSPITAL_COMMUNITY): Payer: Medicaid Other | Admitting: Anesthesiology

## 2013-11-04 DIAGNOSIS — O021 Missed abortion: Secondary | ICD-10-CM | POA: Insufficient documentation

## 2013-11-04 DIAGNOSIS — F3289 Other specified depressive episodes: Secondary | ICD-10-CM | POA: Insufficient documentation

## 2013-11-04 DIAGNOSIS — D649 Anemia, unspecified: Secondary | ICD-10-CM | POA: Insufficient documentation

## 2013-11-04 DIAGNOSIS — J45909 Unspecified asthma, uncomplicated: Secondary | ICD-10-CM | POA: Insufficient documentation

## 2013-11-04 DIAGNOSIS — F329 Major depressive disorder, single episode, unspecified: Secondary | ICD-10-CM | POA: Insufficient documentation

## 2013-11-04 HISTORY — PX: DILATION AND EVACUATION: SHX1459

## 2013-11-04 LAB — CBC
HEMATOCRIT: 33.4 % — AB (ref 36.0–46.0)
Hemoglobin: 11.3 g/dL — ABNORMAL LOW (ref 12.0–15.0)
MCH: 27 pg (ref 26.0–34.0)
MCHC: 33.8 g/dL (ref 30.0–36.0)
MCV: 79.7 fL (ref 78.0–100.0)
Platelets: 252 10*3/uL (ref 150–400)
RBC: 4.19 MIL/uL (ref 3.87–5.11)
RDW: 13.2 % (ref 11.5–15.5)
WBC: 5 10*3/uL (ref 4.0–10.5)

## 2013-11-04 SURGERY — DILATION AND EVACUATION, UTERUS
Anesthesia: Monitor Anesthesia Care | Site: Vagina

## 2013-11-04 MED ORDER — SILVER NITRATE-POT NITRATE 75-25 % EX MISC
CUTANEOUS | Status: DC | PRN
  Administered 2013-11-04: 3 via TOPICAL

## 2013-11-04 MED ORDER — KETOROLAC TROMETHAMINE 30 MG/ML IJ SOLN: 15.0000 mg | Freq: Once | INTRAMUSCULAR | Status: DC | PRN

## 2013-11-04 MED ORDER — LIDOCAINE HCL (CARDIAC) 20 MG/ML IV SOLN
INTRAVENOUS | Status: AC
  Filled 2013-11-04: qty 5

## 2013-11-04 MED ORDER — MIDAZOLAM HCL 2 MG/2ML IJ SOLN
INTRAMUSCULAR | Status: DC | PRN
  Administered 2013-11-04: 2 mg via INTRAVENOUS

## 2013-11-04 MED ORDER — PROPOFOL 10 MG/ML IV EMUL
INTRAVENOUS | Status: AC
  Filled 2013-11-04: qty 20

## 2013-11-04 MED ORDER — MIDAZOLAM HCL 2 MG/2ML IJ SOLN
INTRAMUSCULAR | Status: AC
  Filled 2013-11-04: qty 2

## 2013-11-04 MED ORDER — FENTANYL CITRATE 0.05 MG/ML IJ SOLN
INTRAMUSCULAR | Status: DC | PRN
  Administered 2013-11-04: 100 ug via INTRAVENOUS

## 2013-11-04 MED ORDER — ONDANSETRON HCL 4 MG/2ML IJ SOLN
INTRAMUSCULAR | Status: AC
  Filled 2013-11-04: qty 2

## 2013-11-04 MED ORDER — ONDANSETRON HCL 4 MG/2ML IJ SOLN
INTRAMUSCULAR | Status: DC | PRN
  Administered 2013-11-04: 4 mg via INTRAVENOUS

## 2013-11-04 MED ORDER — CHLOROPROCAINE HCL 1 % IJ SOLN
INTRAMUSCULAR | Status: AC
  Filled 2013-11-04: qty 30

## 2013-11-04 MED ORDER — LIDOCAINE HCL (CARDIAC) 20 MG/ML IV SOLN
INTRAVENOUS | Status: DC | PRN
  Administered 2013-11-04: 30 mg via INTRAVENOUS

## 2013-11-04 MED ORDER — KETOROLAC TROMETHAMINE 30 MG/ML IJ SOLN
INTRAMUSCULAR | Status: AC
  Filled 2013-11-04: qty 1

## 2013-11-04 MED ORDER — ACETAMINOPHEN 325 MG PO TABS: 325.0000 mg | ORAL_TABLET | ORAL | Status: DC | PRN

## 2013-11-04 MED ORDER — KETOROLAC TROMETHAMINE 30 MG/ML IJ SOLN
INTRAMUSCULAR | Status: DC | PRN
  Administered 2013-11-04: 30 mg via INTRAVENOUS

## 2013-11-04 MED ORDER — PROPOFOL 10 MG/ML IV EMUL
INTRAVENOUS | Status: DC | PRN
  Administered 2013-11-04 (×5): 30 mg via INTRAVENOUS

## 2013-11-04 MED ORDER — FENTANYL CITRATE 0.05 MG/ML IJ SOLN
INTRAMUSCULAR | Status: AC
  Filled 2013-11-04: qty 2

## 2013-11-04 MED ORDER — CHLOROPROCAINE HCL 1 % IJ SOLN
INTRAMUSCULAR | Status: DC | PRN
  Administered 2013-11-04: 10 mL

## 2013-11-04 MED ORDER — LACTATED RINGERS IV SOLN
INTRAVENOUS | Status: DC
  Administered 2013-11-04: 16:00:00 via INTRAVENOUS
  Administered 2013-11-04: 125 mL/h via INTRAVENOUS

## 2013-11-04 MED ORDER — ACETAMINOPHEN 160 MG/5ML PO SOLN: 325.0000 mg | ORAL | Status: DC | PRN

## 2013-11-04 MED ORDER — SILVER NITRATE-POT NITRATE 75-25 % EX MISC
CUTANEOUS | Status: AC
  Filled 2013-11-04: qty 1

## 2013-11-04 MED ORDER — FENTANYL CITRATE 0.05 MG/ML IJ SOLN
25.0000 ug | INTRAMUSCULAR | Status: DC | PRN
  Administered 2013-11-04: 50 ug via INTRAVENOUS

## 2013-11-04 MED ORDER — IBUPROFEN 600 MG PO TABS: 600.0000 mg | ORAL_TABLET | Freq: Four times a day (QID) | ORAL | Status: DC | PRN

## 2013-11-04 MED ORDER — LIDOCAINE-EPINEPHRINE (PF) 2 %-1:200000 IJ SOLN
INTRAMUSCULAR | Status: AC
  Filled 2013-11-04: qty 20

## 2013-11-04 MED ORDER — ONDANSETRON HCL 4 MG/2ML IJ SOLN: 4.0000 mg | Freq: Once | INTRAMUSCULAR | Status: DC | PRN

## 2013-11-04 SURGICAL SUPPLY — 20 items
CATH ROBINSON RED A/P 16FR (CATHETERS) ×3 IMPLANT
CLOTH BEACON ORANGE TIMEOUT ST (SAFETY) ×3 IMPLANT
DECANTER SPIKE VIAL GLASS SM (MISCELLANEOUS) ×3 IMPLANT
GLOVE BIOGEL PI IND STRL 7.0 (GLOVE) ×1 IMPLANT
GLOVE BIOGEL PI INDICATOR 7.0 (GLOVE) ×2
GOWN STRL REUS W/TWL LRG LVL3 (GOWN DISPOSABLE) ×6 IMPLANT
KIT BERKELEY 1ST TRIMESTER 3/8 (MISCELLANEOUS) ×3 IMPLANT
NDL SPNL 22GX3.5 QUINCKE BK (NEEDLE) ×1 IMPLANT
NEEDLE SPNL 22GX3.5 QUINCKE BK (NEEDLE) ×3 IMPLANT
NS IRRIG 1000ML POUR BTL (IV SOLUTION) ×3 IMPLANT
PACK VAGINAL MINOR WOMEN LF (CUSTOM PROCEDURE TRAY) ×3 IMPLANT
PAD OB MATERNITY 4.3X12.25 (PERSONAL CARE ITEMS) ×3 IMPLANT
PAD PREP 24X48 CUFFED NSTRL (MISCELLANEOUS) ×3 IMPLANT
SET BERKELEY SUCTION TUBING (SUCTIONS) ×3 IMPLANT
SYR CONTROL 10ML LL (SYRINGE) ×3 IMPLANT
TOWEL OR 17X24 6PK STRL BLUE (TOWEL DISPOSABLE) ×3 IMPLANT
VACURETTE 10 RIGID CVD (CANNULA) ×2 IMPLANT
VACURETTE 7MM CVD STRL WRAP (CANNULA) IMPLANT
VACURETTE 8 RIGID CVD (CANNULA) IMPLANT
VACURETTE 9 RIGID CVD (CANNULA) IMPLANT

## 2013-11-04 NOTE — Anesthesia Preprocedure Evaluation (Addendum)
Anesthesia Evaluation  Patient identified by MRN, date of birth, ID band Patient awake    Reviewed: Allergy & Precautions, H&P , NPO status , Patient's Chart, lab work & pertinent test results  History of Anesthesia Complications Negative for: history of anesthetic complications  Airway Mallampati: II TM Distance: >3 FB Neck ROM: Full    Dental  (+) Teeth Intact   Pulmonary neg pulmonary ROS, asthma , neg recent URI,  No inhaler or recent symptoms breath sounds clear to auscultation        Cardiovascular negative cardio ROS  Rhythm:Regular Rate:Normal     Neuro/Psych Depression Bipolar Disorder negative neurological ROS     GI/Hepatic negative GI ROS, Neg liver ROS,   Endo/Other  negative endocrine ROS  Renal/GU      Musculoskeletal negative musculoskeletal ROS (+)   Abdominal   Peds  Hematology  (+) anemia ,   Anesthesia Other Findings   Reproductive/Obstetrics (+) Pregnancy                          Anesthesia Physical Anesthesia Plan  ASA: II  Anesthesia Plan: MAC   Post-op Pain Management:    Induction: Intravenous  Airway Management Planned: Natural Airway  Additional Equipment: None  Intra-op Plan:   Post-operative Plan:   Informed Consent: I have reviewed the patients History and Physical, chart, labs and discussed the procedure including the risks, benefits and alternatives for the proposed anesthesia with the patient or authorized representative who has indicated his/her understanding and acceptance.   Dental advisory given  Plan Discussed with: CRNA and Surgeon  Anesthesia Plan Comments:         Anesthesia Quick Evaluation

## 2013-11-04 NOTE — H&P (View-Only) (Signed)
   Reason for admission:   Missed Ab   History:     Lindsay Ramirez is a 28 y.o. female, G5P3104, presenting today to undergo a D&E for missed Ab.  Was seen yesterday at MAU for spotting and ultrasound showed a failed pregnancy measuring 8+5 weeks. Blood type: O+  Review of system:  Non-contributory   Past Medical History:   Past Medical History  Diagnosis Date  . Sickle cell trait   . Mental disorder   . Bipolar affective disorder   . Bipolar affective disorder 2006    TOOK MEDS X 3 YR  . Depression     PP 2013  . Abnormal Pap smear     LAST PAP 06/2011  . Asthma     [facility]; [facility]  . Infection     UTI  . Anemia     CHRONIC  . Headache(784.0)     SEVERAL TIMES/DAY  . Hepatitis 2011    HEPATITIS B ????/States negative 11/23/12    Allergies:  NKDA  Social History:   Single, non-smoker  Family History:   Non-contributory  Physical exam:    General Appearance: Alert, appropriate appearance for age. No acute distress Neck / Thyroid: Supple, no masses, nodes or enlargement Lungs: clear to auscultation bilaterally Back: No CVA tenderness. Cardiovascular: Regular rate and rhythm. S1, S2, no murmur Gastrointestinal: Soft, non-tender, no masses or organomegaly Pelvic Exam: VULVA: normal appearing vulva with no masses, tenderness or lesions, VAGINA: normal in appearance with mild VB, CERVIX: normal appearing cervix without discharge or lesions, cx is closed, UTERUS: enlarged to 8 week's size, ADNEXA: normal adnexa in size, nontender and no masses    Assessment:   Missed AB in patient who desires to proceed with D&E   Plan:    Proceed with D&E under sedation. Procedure, risks and benefits reviewed with patient including but not limited to bleeding, infection, persistence of POC. Patient voices understanding and is agreeable to proceed.      Tomasita Beevers A MD  

## 2013-11-04 NOTE — Interval H&P Note (Signed)
History and Physical Interval Note:  11/04/2013 3:29 PM  Beckemeyer  has presented today for surgery, with the diagnosis of Missed abortion [[id number]]  The various methods of treatment have been discussed with the patient and family. After consideration of risks, benefits and other options for treatment, the patient has consented to  Procedure(s): DILATATION AND EVACUATION (N/A) as a surgical intervention .  The patient's history has been reviewed, patient examined, no change in status, stable for surgery.  I have reviewed the patient's chart and labs.  Questions were answered to the patient's satisfaction.   Patient with increased bleeding today. Denies passing tissue.Bedside ultrasound confirms gestational sac still in uterus.    Arran Fessel A

## 2013-11-04 NOTE — H&P (Signed)
   Reason for admission:   Missed Ab   History:     Lindsay Ramirez is a 28 y.o. female, E3P2951, presenting today to undergo a D&E for missed Ab.  Was seen yesterday at MAU for spotting and ultrasound showed a failed pregnancy measuring 8+5 weeks. Blood type: O+  Review of system:  Non-contributory   Past Medical History:   Past Medical History  Diagnosis Date  . Sickle cell trait   . Mental disorder   . Bipolar affective disorder   . Bipolar affective disorder 2006    TOOK MEDS X 3 YR  . Depression     PP 2013  . Abnormal Pap smear     LAST PAP 06/2011  . Asthma     [facility]; [facility]  . Infection     UTI  . Anemia     CHRONIC  . Headache(784.0)     SEVERAL TIMES/DAY  . Hepatitis 2011    HEPATITIS B ????/States negative 11/23/12    Allergies:  NKDA  Social History:   Single, non-smoker  Family History:   Non-contributory  Physical exam:    General Appearance: Alert, appropriate appearance for age. No acute distress Neck / Thyroid: Supple, no masses, nodes or enlargement Lungs: clear to auscultation bilaterally Back: No CVA tenderness. Cardiovascular: Regular rate and rhythm. S1, S2, no murmur Gastrointestinal: Soft, non-tender, no masses or organomegaly Pelvic Exam: VULVA: normal appearing vulva with no masses, tenderness or lesions, VAGINA: normal in appearance with mild VB, CERVIX: normal appearing cervix without discharge or lesions, cx is closed, UTERUS: enlarged to 8 week's size, ADNEXA: normal adnexa in size, nontender and no masses    Assessment:   Missed AB in patient who desires to proceed with D&E   Plan:    Proceed with D&E under sedation. Procedure, risks and benefits reviewed with patient including but not limited to bleeding, infection, persistence of POC. Patient voices understanding and is agreeable to proceed.      Delsa Bern A MD

## 2013-11-04 NOTE — Transfer of Care (Signed)
Immediate Anesthesia Transfer of Care Note  Patient: Lindsay Ramirez  Procedure(s) Performed: Procedure(s): DILATATION AND EVACUATION (N/A)  Patient Location: PACU  Anesthesia Type:MAC  Level of Consciousness: awake, alert , oriented and patient cooperative  Airway & Oxygen Therapy: Patient Spontanous Breathing  Post-op Assessment: Report given to PACU RN, Post -op Vital signs reviewed and stable and Patient moving all extremities X 4  Post vital signs: Reviewed and stable  Complications: No apparent anesthesia complications

## 2013-11-04 NOTE — Discharge Instructions (Signed)
POST-OPERATIVE INSTRUCTIONS TO PATIENT  Call [facility]  [phone])  for excessive pain, bleeding or temperature greater than or equal to 100.4 degrees (orally).    No driving for 24 hours No sexual activity for 2 weeks No tampon for 48 hours  Pain management:  Use Ibuprofen 600 mg every 6 hours for 5 days and then as needed. Use your pain medication as needed to maintain a pain level at or below 3/10 Use Colace 1-2 capsules per day as long as you are using pain  medication to avoid constipation.       Diet: normal  Bathing: may shower day after surgery   Return to Dr. Cletis Media: the office will call you for your post-op visit     Kaizley Aja A MD 11/04/2013 4:09 PM

## 2013-11-04 NOTE — Op Note (Signed)
Preoperative diagnosis: Missed AB  Postoperative diagnosis: Same  Anesthesia: IV sedation and paracervical block  Anesthesiologist: Dr. Ermalene Postin  Procedure: Dilatation and evacuation  Surgeon: Dr. Katharine Look Shekera Beavers  Estimated blood loss: Minimal  Procedure:  After being informed of the planned procedure with possible complications including bleeding, infection and injury to uterus, informed consent is obtained and patient is taken to or #3. She is placed in the lithotomy position and given IV sedation without complication. She is then prepped and draped in a sterile  fashion and her bladder is emptied with an in and out red rubber catheter.  Pelvic exam reveals a anteverted  uterus compatible with 8-[redacted] weeks gestation. Both adnexa are felt and normal.  A weighted speculum is inserted in the vagina and the anterior lip of the cervix was grasped with a tenaculum forcep. We proceed with a paracervical block using 10 cc of Nesacaine 1%. The uterus was then sounded at 10 cm. The cervix was easily dilated using Hegar dilator until  #31. Using a #10 curved cannula, we proceed with evacuation of products of conception without difficulty. We then proceed with sharp curettage of the uterine cavity to confirm complete evacuation.  Instruments are then removed. Instrument and sponge count is complete x2.   Estimated blood loss is minimal.  The procedure is very well tolerated by the patient is taken to recovery room in a well and stable condition.  Specimen: Products of conception sent to pathology

## 2013-11-04 NOTE — Anesthesia Postprocedure Evaluation (Signed)
  Anesthesia Post-op Note  Patient: Lindsay Ramirez  Procedure(s) Performed: Procedure(s): DILATATION AND EVACUATION (N/A)  Patient Location: PACU  Anesthesia Type:MAC  Level of Consciousness: awake, alert  and oriented  Airway and Oxygen Therapy: Patient Spontanous Breathing  Post-op Pain: mild  Post-op Assessment: Post-op Vital signs reviewed, Patient's Cardiovascular Status Stable, Respiratory Function Stable, Patent Airway, No signs of Nausea or vomiting and Pain level controlled  Post-op Vital Signs: Reviewed and stable  Complications: No apparent anesthesia complications

## 2013-11-07 ENCOUNTER — Encounter (HOSPITAL_COMMUNITY): Payer: Self-pay | Admitting: Obstetrics and Gynecology

## 2013-11-11 ENCOUNTER — Inpatient Hospital Stay (HOSPITAL_COMMUNITY): Payer: Medicaid Other

## 2013-11-11 ENCOUNTER — Inpatient Hospital Stay (HOSPITAL_COMMUNITY)
Admission: AD | Admit: 2013-11-11 | Discharge: 2013-11-11 | Disposition: A | Discharge status: Home / Self Care | Payer: Medicaid Other | Source: Ambulatory Visit | Attending: Obstetrics and Gynecology | Admitting: Obstetrics and Gynecology

## 2013-11-11 ENCOUNTER — Encounter (HOSPITAL_COMMUNITY): Payer: Self-pay

## 2013-11-11 DIAGNOSIS — R109 Unspecified abdominal pain: Secondary | ICD-10-CM | POA: Insufficient documentation

## 2013-11-11 DIAGNOSIS — R197 Diarrhea, unspecified: Secondary | ICD-10-CM | POA: Insufficient documentation

## 2013-11-11 DIAGNOSIS — O039 Complete or unspecified spontaneous abortion without complication: Secondary | ICD-10-CM | POA: Diagnosis present

## 2013-11-11 DIAGNOSIS — N719 Inflammatory disease of uterus, unspecified: Secondary | ICD-10-CM

## 2013-11-11 DIAGNOSIS — D251 Intramural leiomyoma of uterus: Secondary | ICD-10-CM | POA: Insufficient documentation

## 2013-11-11 DIAGNOSIS — R509 Fever, unspecified: Secondary | ICD-10-CM | POA: Insufficient documentation

## 2013-11-11 LAB — URINALYSIS, ROUTINE W REFLEX MICROSCOPIC
Bilirubin Urine: NEGATIVE
GLUCOSE, UA: NEGATIVE mg/dL
Ketones, ur: NEGATIVE mg/dL
LEUKOCYTES UA: NEGATIVE
Nitrite: NEGATIVE
PH: 5.5 (ref 5.0–8.0)
PROTEIN: NEGATIVE mg/dL
Urobilinogen, UA: 0.2 mg/dL (ref 0.0–1.0)

## 2013-11-11 LAB — URINE MICROSCOPIC-ADD ON

## 2013-11-11 LAB — CBC WITH DIFFERENTIAL/PLATELET
Basophils Absolute: 0 10*3/uL (ref 0.0–0.1)
Basophils Relative: 0 % (ref 0–1)
EOS ABS: 0.2 10*3/uL (ref 0.0–0.7)
Eosinophils Relative: 3 % (ref 0–5)
HCT: 34.2 % — ABNORMAL LOW (ref 36.0–46.0)
Hemoglobin: 11.5 g/dL — ABNORMAL LOW (ref 12.0–15.0)
LYMPHS ABS: 2.9 10*3/uL (ref 0.7–4.0)
LYMPHS PCT: 35 % (ref 12–46)
MCH: 26.8 pg (ref 26.0–34.0)
MCHC: 33.6 g/dL (ref 30.0–36.0)
MCV: 79.7 fL (ref 78.0–100.0)
Monocytes Absolute: 0.7 10*3/uL (ref 0.1–1.0)
Monocytes Relative: 8 % (ref 3–12)
NEUTROS PCT: 54 % (ref 43–77)
Neutro Abs: 4.4 10*3/uL (ref 1.7–7.7)
Platelets: 268 10*3/uL (ref 150–400)
RBC: 4.29 MIL/uL (ref 3.87–5.11)
RDW: 13.1 % (ref 11.5–15.5)
WBC: 8.2 10*3/uL (ref 4.0–10.5)

## 2013-11-11 MED ORDER — LACTATED RINGERS IV SOLN: INTRAVENOUS | Status: DC

## 2013-11-11 MED ORDER — KETOROLAC TROMETHAMINE 30 MG/ML IJ SOLN: 30.0000 mg | Freq: Once | INTRAMUSCULAR | Status: DC

## 2013-11-11 MED ORDER — HYDROCODONE-ACETAMINOPHEN 5-325 MG PO TABS: 1.0000 | ORAL_TABLET | Freq: Four times a day (QID) | ORAL | Status: DC | PRN

## 2013-11-11 MED ORDER — DOXYCYCLINE HYCLATE 100 MG PO CAPS: 100.0000 mg | ORAL_CAPSULE | Freq: Two times a day (BID) | ORAL | Status: AC

## 2013-11-11 MED ORDER — LACTATED RINGERS IV BOLUS (SEPSIS): 250.0000 mL | Freq: Once | INTRAVENOUS | Status: DC

## 2013-11-11 MED ORDER — KETOROLAC TROMETHAMINE 30 MG/ML IJ SOLN
30.0000 mg | Freq: Once | INTRAMUSCULAR | Status: AC
  Administered 2013-11-11: 30 mg via INTRAMUSCULAR
  Filled 2013-11-11: qty 1

## 2013-11-11 NOTE — Discharge Instructions (Signed)
Endometritis °Endometritis is an irritation, soreness, and swelling (inflammation) of the lining of the uterus (endometrium).  °CAUSES  °· Bacterial infections. °· Sexually transmitted infections (STIs). °· Having a miscarriage or childbirth, especially after a long labor or cesarean delivery. °· Certain gynecological procedures (such as dilation and curettage, hysteroscopy, or contraceptive insertion). °SIGNS AND SYMPTOMS  °· Fever. °· Lower abdominal or pelvic pain. °· Abnormal vaginal discharge or bleeding. °· Abdominal bloating (distention) or swelling. °· General discomfort or ill feeling. °· Discomfort with bowel movements. °DIAGNOSIS  °A physical and pelvic exam are performed. Other tests may include: °· Cultures from the cervix. °· Blood tests. °· Examining a tissue sample of the uterine lining (endometrial biopsy). °· Examining discharge under a microscope (wet prep). °· Laparoscopy. °TREATMENT  °Antibiotic medicines are usually given. Other treatments may include: °· Fluids through an IV tube inserted in your vein. °· Rest. °HOME CARE INSTRUCTIONS  °· Take over-the-counter or prescription medicines for pain, discomfort, or fever as directed by your health care provider. °· Take your antibiotics as directed. Finish them even if you start to feel better. °· Resume your normal diet and activities as directed or as tolerated. °· Do not douche or have sexual intercourse until your health care provider says it is okay. °· Do not have sexual intercourse until your partner has been treated if your endometritis is caused by an STI. °SEEK IMMEDIATE MEDICAL CARE IF:  °· You have swelling or increasing pain in the abdomen. °· You have a fever. °· You have bad smelling vaginal discharge, or you have an increased amount of discharge. °· You have abnormal vaginal bleeding. °· Your medicine is not helping with the pain. °· You experience any problems that may be related to the medicine you are taking. °· You have nausea  and vomiting, or you cannot keep foods down. °· You have pain with bowel movements. °MAKE SURE YOU:  °· Understand these instructions. °· Will watch your condition. °· Will get help right away if you are not doing well or get worse. °Document Released: 08/19/2001 Document Revised: 04/27/2013 Document Reviewed: 03/24/2013 °Exit[employer]® Patient Information ©2014 [employer]. ° °

## 2013-11-11 NOTE — MAU Note (Signed)
Patient states she had a D & E on 2-27 for missed AB. States she started having abdominal pain and diarrhea on 2-18. Reports minimal relief with Ibuprofen. States she is bleeding like a period.

## 2013-11-11 NOTE — MAU Provider Note (Signed)
History   28 yo [id number] s/p D&E 2/27 for missed AB at 8 5/7 weeks by Dr. Cletis Media, presenting after calling office with c/o increased abdominal pain since procedure.  Reported "might" have had low grade fever, but unsure.  No N/V or foul d/c.  No IC since late in pregnancy.  Has been using Motrin without benefit.  POCs were noted on path report.    Patient Active Problem List   Diagnosis Date Noted  . SAB (spontaneous abortion), with D&E 11/04/13 11/13/2013  . Endometritis s/p D&E for SAB 11/13/2013  . Bipolar disorder, unspecified/depression 11/11/2012  . 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 Vitamin D deficiency 07/26/2012  . Acid reflux 06/21/2012  . BV (bacterial vaginosis) 06/21/2012     Chief Complaint  Patient presents with  . Abdominal Pain  . Diarrhea   HPI:  See above  OB History   Grav Para Term Preterm Abortions TAB SAB Ect Mult Living   5 4 3 1  0 0 0 0 0 4      Past Medical History  Diagnosis Date  . Sickle cell trait   . Mental disorder   . Bipolar affective disorder   . Bipolar affective disorder 2006    TOOK MEDS X 3 YR  . Depression     PP 2013  . Abnormal Pap smear     LAST PAP 06/2011  . Asthma     [facility]; [facility]  . Infection     UTI  . Anemia     CHRONIC  . Headache(784.0)     SEVERAL TIMES/DAY  . Hepatitis 2011    HEPATITIS B ????/States negative 11/23/12    Past Surgical History  Procedure Laterality Date  . Mouth surgery    . Wisdom tooth extraction  2012  . Vaginal delivery      X 3  . Dilation and evacuation N/A 11/04/2013    Procedure: DILATATION AND EVACUATION;  Surgeon: Alwyn Pea, MD;  Location: [facility];  Service: Gynecology;  Laterality: N/A;    Family History  Problem Relation Age of Onset  . Hypertension Mother   . Asthma Mother   . Heart disease Mother   . Hyperlipidemia Mother   . Thyroid disease Mother   . Alcohol abuse Father   . Asthma Sister   . Depression Sister   . Diabetes Maternal Aunt   . HIV  Maternal Aunt   . Cancer Maternal Grandmother 37    COLON  . Depression Maternal Grandmother   . Alcohol abuse Maternal Grandmother   . Drug abuse Maternal Grandmother   . Alcohol abuse Maternal Grandfather   . Drug abuse Maternal Grandfather   . Stroke Maternal Grandfather   . Alcohol abuse Paternal Grandfather   . Drug abuse Paternal Grandfather   . Asthma Sister     History  Substance Use Topics  . Smoking status: Never Smoker   . Smokeless tobacco: Never Used  . Alcohol Use: No    Allergies: No Known Allergies  No prescriptions prior to admission    ROS:  Lower abdominal pain. Physical Exam   Blood pressure 110/72, pulse 55, temperature 98.8 F (37.1 C), temperature source Oral, resp. rate 16, height 5\' 1"  (1.549 m), weight 201 lb 3.2 oz (91.264 kg), last menstrual period 08/08/2013, SpO2 99.00%, unknown if currently breastfeeding.  Physical Exam Chest clear Heart RRR without murmur Abd soft, moderately tender in lower abdomen Pelvic--moderate uterine tenderness to palpation, uterus small, minimal blood in  vault, no CMT, no d/c. Ext WNL  Results for orders placed during the hospital encounter of 11/11/13 (from the past 48 hour(s))  CBC WITH DIFFERENTIAL     Status: Abnormal   Collection Time    11/11/13  3:30 PM      Result Value Ref Range   WBC 8.2  4.0 - 10.5 K/uL   RBC 4.29  3.87 - 5.11 MIL/uL   Hemoglobin 11.5 (*) 12.0 - 15.0 g/dL   HCT 34.2 (*) 36.0 - 46.0 %   MCV 79.7  78.0 - 100.0 fL   MCH 26.8  26.0 - 34.0 pg   MCHC 33.6  30.0 - 36.0 g/dL   RDW 13.1  11.5 - 15.5 %   Platelets 268  150 - 400 K/uL   Neutrophils Relative % 54  43 - 77 %   Neutro Abs 4.4  1.7 - 7.7 K/uL   Lymphocytes Relative 35  12 - 46 %   Lymphs Abs 2.9  0.7 - 4.0 K/uL   Monocytes Relative 8  3 - 12 %   Monocytes Absolute 0.7  0.1 - 1.0 K/uL   Eosinophils Relative 3  0 - 5 %   Eosinophils Absolute 0.2  0.0 - 0.7 K/uL   Basophils Relative 0  0 - 1 %   Basophils Absolute 0.0  0.0  - 0.1 K/uL  URINALYSIS, ROUTINE W REFLEX MICROSCOPIC     Status: Abnormal   Collection Time    11/11/13  4:15 PM      Result Value Ref Range   Color, Urine YELLOW  YELLOW   APPearance CLEAR  CLEAR   Specific Gravity, Urine >1.030 (*) 1.005 - 1.030   pH 5.5  5.0 - 8.0   Glucose, UA NEGATIVE  NEGATIVE mg/dL   Hgb urine dipstick MODERATE (*) NEGATIVE   Bilirubin Urine NEGATIVE  NEGATIVE   Ketones, ur NEGATIVE  NEGATIVE mg/dL   Protein, ur NEGATIVE  NEGATIVE mg/dL   Urobilinogen, UA 0.2  0.0 - 1.0 mg/dL   Nitrite NEGATIVE  NEGATIVE   Leukocytes, UA NEGATIVE  NEGATIVE  URINE MICROSCOPIC-ADD ON     Status: Abnormal   Collection Time    11/11/13  4:15 PM      Result Value Ref Range   Squamous Epithelial / LPF FEW (*) RARE   WBC, UA 3-6  <3 WBC/hpf   RBC / HPF 0-2  <3 RBC/hpf   Bacteria, UA FEW (*) RARE   Urine-Other MUCOUS PRESENT     US/IMPRESSION:  Normal thickness of the endometrium with a small amount of fluid in  the endometrial canal.  Small amount of free fluid in the pelvis.  Ovaries WNL.  Received Toradol 30 mg IM in MAU with benefit.  ED Course  S/P D&E for failed pregnancy on 10/26/13 Probable early endometritis  Plan:   Consulted with Dr. Mancel Bale. Rx Doxycycline 100 mg po BID x 7 days. Rx Vicodin 5/325 1-2 po q 4 hours prn pain, #30, no refills Precautions reviewed--will f/u with any increase in bleeding, pain, or occurrence of fever. To f/u in office this week for recheck.  Office to call on Monday to schedule f/u exam.   Donnel Saxon CNM, [place] 11/11/2013 7:22 PM

## 2013-11-11 NOTE — MAU Note (Signed)
Patient states she has had a lot of diarrhea since procedure on  2/28. States all over abdomen is cramping.

## 2013-11-13 DIAGNOSIS — O039 Complete or unspecified spontaneous abortion without complication: Secondary | ICD-10-CM | POA: Diagnosis present

## 2013-11-13 DIAGNOSIS — N719 Inflammatory disease of uterus, unspecified: Secondary | ICD-10-CM

## 2013-12-29 ENCOUNTER — Emergency Department (HOSPITAL_COMMUNITY)
Admission: EM | Admit: 2013-12-29 | Discharge: 2013-12-29 | Disposition: A | Discharge status: Home / Self Care | Payer: Medicaid Other | Attending: Emergency Medicine | Admitting: Emergency Medicine

## 2013-12-29 DIAGNOSIS — Z3202 Encounter for pregnancy test, result negative: Secondary | ICD-10-CM | POA: Insufficient documentation

## 2013-12-29 DIAGNOSIS — Z79899 Other long term (current) drug therapy: Secondary | ICD-10-CM | POA: Insufficient documentation

## 2013-12-29 DIAGNOSIS — R1084 Generalized abdominal pain: Secondary | ICD-10-CM | POA: Insufficient documentation

## 2013-12-29 DIAGNOSIS — Z8659 Personal history of other mental and behavioral disorders: Secondary | ICD-10-CM | POA: Insufficient documentation

## 2013-12-29 DIAGNOSIS — Z862 Personal history of diseases of the blood and blood-forming organs and certain disorders involving the immune mechanism: Secondary | ICD-10-CM | POA: Insufficient documentation

## 2013-12-29 DIAGNOSIS — R112 Nausea with vomiting, unspecified: Secondary | ICD-10-CM

## 2013-12-29 DIAGNOSIS — Z8719 Personal history of other diseases of the digestive system: Secondary | ICD-10-CM | POA: Insufficient documentation

## 2013-12-29 DIAGNOSIS — Z791 Long term (current) use of non-steroidal anti-inflammatories (NSAID): Secondary | ICD-10-CM | POA: Insufficient documentation

## 2013-12-29 DIAGNOSIS — R109 Unspecified abdominal pain: Secondary | ICD-10-CM

## 2013-12-29 DIAGNOSIS — R197 Diarrhea, unspecified: Secondary | ICD-10-CM | POA: Insufficient documentation

## 2013-12-29 DIAGNOSIS — R143 Flatulence: Secondary | ICD-10-CM

## 2013-12-29 DIAGNOSIS — R142 Eructation: Secondary | ICD-10-CM

## 2013-12-29 DIAGNOSIS — Z87440 Personal history of urinary (tract) infections: Secondary | ICD-10-CM | POA: Insufficient documentation

## 2013-12-29 DIAGNOSIS — R141 Gas pain: Secondary | ICD-10-CM | POA: Insufficient documentation

## 2013-12-29 DIAGNOSIS — J45909 Unspecified asthma, uncomplicated: Secondary | ICD-10-CM | POA: Insufficient documentation

## 2013-12-29 LAB — COMPREHENSIVE METABOLIC PANEL
ALT: 11 U/L (ref 0–35)
AST: 14 U/L (ref 0–37)
Albumin: 4 g/dL (ref 3.5–5.2)
Alkaline Phosphatase: 69 U/L (ref 39–117)
BUN: 7 mg/dL (ref 6–23)
CALCIUM: 9.4 mg/dL (ref 8.4–10.5)
CO2: 23 meq/L (ref 19–32)
CREATININE: 0.72 mg/dL (ref 0.50–1.10)
Chloride: 105 mEq/L (ref 96–112)
GLUCOSE: 93 mg/dL (ref 70–99)
Potassium: 3.9 mEq/L (ref 3.7–5.3)
Sodium: 141 mEq/L (ref 137–147)
Total Bilirubin: 0.4 mg/dL (ref 0.3–1.2)
Total Protein: 7.4 g/dL (ref 6.0–8.3)

## 2013-12-29 LAB — CBC WITH DIFFERENTIAL/PLATELET
Basophils Absolute: 0 10*3/uL (ref 0.0–0.1)
Basophils Relative: 0 % (ref 0–1)
EOS PCT: 3 % (ref 0–5)
Eosinophils Absolute: 0.2 10*3/uL (ref 0.0–0.7)
HCT: 35.4 % — ABNORMAL LOW (ref 36.0–46.0)
Hemoglobin: 11.8 g/dL — ABNORMAL LOW (ref 12.0–15.0)
LYMPHS ABS: 1.8 10*3/uL (ref 0.7–4.0)
LYMPHS PCT: 29 % (ref 12–46)
MCH: 26.9 pg (ref 26.0–34.0)
MCHC: 33.3 g/dL (ref 30.0–36.0)
MCV: 80.6 fL (ref 78.0–100.0)
MONO ABS: 0.7 10*3/uL (ref 0.1–1.0)
Monocytes Relative: 11 % (ref 3–12)
Neutro Abs: 3.6 10*3/uL (ref 1.7–7.7)
Neutrophils Relative %: 57 % (ref 43–77)
Platelets: 259 10*3/uL (ref 150–400)
RBC: 4.39 MIL/uL (ref 3.87–5.11)
RDW: 13.2 % (ref 11.5–15.5)
WBC: 6.3 10*3/uL (ref 4.0–10.5)

## 2013-12-29 LAB — URINALYSIS, ROUTINE W REFLEX MICROSCOPIC
BILIRUBIN URINE: NEGATIVE
Glucose, UA: NEGATIVE mg/dL
Hgb urine dipstick: NEGATIVE
Ketones, ur: NEGATIVE mg/dL
Leukocytes, UA: NEGATIVE
Nitrite: NEGATIVE
Protein, ur: NEGATIVE mg/dL
SPECIFIC GRAVITY, URINE: 1.018 (ref 1.005–1.030)
UROBILINOGEN UA: 0.2 mg/dL (ref 0.0–1.0)
pH: 5.5 (ref 5.0–8.0)

## 2013-12-29 LAB — LIPASE, BLOOD: Lipase: 14 U/L (ref 11–59)

## 2013-12-29 LAB — POC URINE PREG, ED: Preg Test, Ur: NEGATIVE

## 2013-12-29 MED ORDER — SODIUM CHLORIDE 0.9 % IV BOLUS (SEPSIS): 1000.0000 mL | Freq: Once | INTRAVENOUS | Status: DC

## 2013-12-29 MED ORDER — ONDANSETRON HCL 4 MG/2ML IJ SOLN
4.0000 mg | Freq: Once | INTRAMUSCULAR | Status: AC
  Administered 2013-12-29: 4 mg via INTRAVENOUS
  Filled 2013-12-29: qty 2

## 2013-12-29 MED ORDER — MORPHINE SULFATE 4 MG/ML IJ SOLN
4.0000 mg | Freq: Once | INTRAMUSCULAR | Status: AC
  Administered 2013-12-29: 4 mg via INTRAVENOUS
  Filled 2013-12-29: qty 1

## 2013-12-29 MED ORDER — ONDANSETRON HCL 4 MG PO TABS: 4.0000 mg | ORAL_TABLET | Freq: Four times a day (QID) | ORAL | Status: DC

## 2013-12-29 MED ORDER — SODIUM CHLORIDE 0.9 % IV BOLUS (SEPSIS)
1000.0000 mL | Freq: Once | INTRAVENOUS | Status: AC
  Administered 2013-12-29: 1000 mL via INTRAVENOUS

## 2013-12-29 NOTE — Discharge Instructions (Signed)
Abdominal Pain, Adult Many things can cause abdominal pain. Usually, abdominal pain is not caused by a disease and will improve without treatment. It can often be observed and treated at home. Your health care provider will do a physical exam and possibly order blood tests and X-rays to help determine the seriousness of your pain. However, in many cases, more time must pass before a clear cause of the pain can be found. Before that point, your health care provider may not know if you need more testing or further treatment. HOME CARE INSTRUCTIONS  Monitor your abdominal pain for any changes. The following actions may help to alleviate any discomfort you are experiencing:  Only take over-the-counter or prescription medicines as directed by your health care provider.  Do not take laxatives unless directed to do so by your health care provider.  Try a clear liquid diet (broth, tea, or water) as directed by your health care provider. Slowly move to a bland diet as tolerated. SEEK MEDICAL CARE IF:  You have unexplained abdominal pain.  You have abdominal pain associated with nausea or diarrhea.  You have pain when you urinate or have a bowel movement.  You experience abdominal pain that wakes you in the night.  You have abdominal pain that is worsened or improved by eating food.  You have abdominal pain that is worsened with eating fatty foods. SEEK IMMEDIATE MEDICAL CARE IF:   Your pain does not go away within 2 hours.  You have a fever.  You keep throwing up (vomiting).  Your pain is felt only in portions of the abdomen, such as the right side or the left lower portion of the abdomen.  You pass bloody or black tarry stools. MAKE SURE YOU:  Understand these instructions.   Will watch your condition.   Will get help right away if you are not doing well or get worse.  Document Released: 06/04/2005 Document Revised: 06/15/2013 Document Reviewed: 05/04/2013 [facility] Patient  Information 2014 [employer]. Nausea and Vomiting Nausea is a sick feeling that often comes before throwing up (vomiting). Vomiting is a reflex where stomach contents come out of your mouth. Vomiting can cause severe loss of body fluids (dehydration). Children and elderly adults can become dehydrated quickly, especially if they also have diarrhea. Nausea and vomiting are symptoms of a condition or disease. It is important to find the cause of your symptoms. CAUSES   Direct irritation of the stomach lining. This irritation can result from increased acid production (gastroesophageal reflux disease), infection, food poisoning, taking certain medicines (such as nonsteroidal anti-inflammatory drugs), alcohol use, or tobacco use.  Signals from the brain.These signals could be caused by a headache, heat exposure, an inner ear disturbance, increased pressure in the brain from injury, infection, a tumor, or a concussion, pain, emotional stimulus, or metabolic problems.  An obstruction in the gastrointestinal tract (bowel obstruction).  Illnesses such as diabetes, hepatitis, gallbladder problems, appendicitis, kidney problems, cancer, sepsis, atypical symptoms of a heart attack, or eating disorders.  Medical treatments such as chemotherapy and radiation.  Receiving medicine that makes you sleep (general anesthetic) during surgery. DIAGNOSIS Your caregiver may ask for tests to be done if the problems do not improve after a few days. Tests may also be done if symptoms are severe or if the reason for the nausea and vomiting is not clear. Tests may include:  Urine tests.  Blood tests.  Stool tests.  Cultures (to look for evidence of infection).  X-rays or other  other imaging studies. °Test results can help your caregiver make decisions about treatment or the need for additional tests. °TREATMENT °You need to stay well hydrated. Drink frequently but in small amounts. You may wish to drink water, sports  drinks, clear broth, or eat frozen ice pops or gelatin dessert to help stay hydrated. When you eat, eating slowly may help prevent nausea. There are also some antinausea medicines that may help prevent nausea. °HOME CARE INSTRUCTIONS  °· Take all medicine as directed by your caregiver. °· If you do not have an appetite, do not force yourself to eat. However, you must continue to drink fluids. °· If you have an appetite, eat a normal diet unless your caregiver tells you differently. °· Eat a variety of complex carbohydrates (rice, wheat, potatoes, bread), lean meats, yogurt, fruits, and vegetables. °· Avoid high-fat foods because they are more difficult to digest. °· Drink enough water and fluids to keep your urine clear or pale yellow. °· If you are dehydrated, ask your caregiver for specific rehydration instructions. Signs of dehydration may include: °· Severe thirst. °· Dry lips and mouth. °· Dizziness. °· Dark urine. °· Decreasing urine frequency and amount. °· Confusion. °· Rapid breathing or pulse. °SEEK IMMEDIATE MEDICAL CARE IF:  °· You have blood or brown flecks (like coffee grounds) in your vomit. °· You have black or bloody stools. °· You have a severe headache or stiff neck. °· You are confused. °· You have severe abdominal pain. °· You have chest pain or trouble breathing. °· You do not urinate at least once every 8 hours. °· You develop cold or clammy skin. °· You continue to vomit for longer than 24 to 48 hours. °· You have a fever. °MAKE SURE YOU:  °· Understand these instructions. °· Will watch your condition. °· Will get help right away if you are not doing well or get worse. °Document Released: 08/25/2005 Document Revised: 11/17/2011 Document Reviewed: 01/22/2011 °[employer]® Patient Information ©2014 [employer]. ° °

## 2013-12-29 NOTE — ED Notes (Signed)
Pt taking PO well with no vomiting 

## 2013-12-29 NOTE — ED Provider Notes (Signed)
Medical screening examination/treatment/procedure(s) were conducted as a shared visit with non-physician practitioner(s) and myself.  I personally evaluated the patient during the encounter.   EKG Interpretation   Date/Time:  Thursday December 29 2013 08:31:25 EDT Ventricular Rate:  43 PR Interval:  135 QRS Duration: 83 QT Interval:  454 QTC Calculation: 384 R Axis:   61 Text Interpretation:  Sinus bradycardia Otherwise within normal limits no  change from previous other than slower rate Confirmed by POLLINA  MD,  CHRISTOPHER [phone]) on 12/29/2013 9:20:30 AM      Patient with n/v/d and cramping. Feeling much better after fluids and meds. Recheck, abd exam benign, nontender. Labs normal. Discharge with symptomatic care.  Orpah Greek, MD 12/29/13 [phone]

## 2013-12-29 NOTE — ED Notes (Signed)
C/o chronic abd pain, vomiting X2d, diarrhea X4d, denies urinary s/s, no fever/chills, ambulatory, A/O X4 and in NAD

## 2013-12-29 NOTE — ED Provider Notes (Signed)
CSN: [id number]     Arrival date & time 12/29/13  4098 History   First MD Initiated Contact with Patient 12/29/13 0703     Chief Complaint  Patient presents with  . Abdominal Pain  . Diarrhea  . Emesis     (Consider location/radiation/quality/duration/timing/severity/associated sxs/prior Treatment) HPI Comments: Patient presents to the ED with a chief complaint of abdominal pain.  She reports having abdominal pain like this several times in the past.  The pain has worsened in th past couple of days.  She reports associated nausea and vomiting x 2 days and diarrhea x 4 days.  She states that she thinks she saw some blood in her vomit and stool, but isn't certain.  She denies fevers, chills, chest pain, SOB, or dysuria.  She has tried taking vicodin with no relief.  She denies history of abdominal surgery.  The history is provided by the patient. No language interpreter was used.    Past Medical History  Diagnosis Date  . Sickle cell trait   . Mental disorder   . Bipolar affective disorder   . Bipolar affective disorder 2006    TOOK MEDS X 3 YR  . Depression     PP 2013  . Abnormal Pap smear     LAST PAP 06/2011  . Asthma     [facility]; [facility]  . Infection     UTI  . Anemia     CHRONIC  . Headache(784.0)     SEVERAL TIMES/DAY  . Hepatitis 2011    HEPATITIS B ????/States negative 11/23/12   Past Surgical History  Procedure Laterality Date  . Mouth surgery    . Wisdom tooth extraction  2012  . Vaginal delivery      X 3  . Dilation and evacuation N/A 11/04/2013    Procedure: DILATATION AND EVACUATION;  Surgeon: Alwyn Pea, MD;  Location: [facility];  Service: Gynecology;  Laterality: N/A;   Family History  Problem Relation Age of Onset  . Hypertension Mother   . Asthma Mother   . Heart disease Mother   . Hyperlipidemia Mother   . Thyroid disease Mother   . Alcohol abuse Father   . Asthma Sister   . Depression Sister   . Diabetes Maternal Aunt   . HIV Maternal  Aunt   . Cancer Maternal Grandmother 37    COLON  . Depression Maternal Grandmother   . Alcohol abuse Maternal Grandmother   . Drug abuse Maternal Grandmother   . Alcohol abuse Maternal Grandfather   . Drug abuse Maternal Grandfather   . Stroke Maternal Grandfather   . Alcohol abuse Paternal Grandfather   . Drug abuse Paternal Grandfather   . Asthma Sister    History  Substance Use Topics  . Smoking status: Never Smoker   . Smokeless tobacco: Never Used  . Alcohol Use: No   OB History   Grav Para Term Preterm Abortions TAB SAB Ect Mult Living   5 4 3 1  0 0 0 0 0 4     Review of Systems  Constitutional: Negative for fever and chills.  Respiratory: Negative for shortness of breath.   Cardiovascular: Negative for chest pain.  Gastrointestinal: Positive for nausea, vomiting, diarrhea and abdominal distention. Negative for constipation.  Genitourinary: Negative for dysuria.      Allergies  Review of patient's allergies indicates no known allergies.  Home Medications   Prior to Admission medications   Medication Sig Start Date End Date Taking?  Authorizing Provider  acetaminophen (TYLENOL) 500 MG tablet Take 500 mg by mouth every 6 (six) hours as needed for moderate pain.   Yes Historical Provider, MD  HYDROcodone-acetaminophen (NORCO/VICODIN) 5-325 MG per tablet Take 1 tablet by mouth every 6 (six) hours as needed for moderate pain. 11/11/13  Yes Donnel Saxon, CNM  ibuprofen (ADVIL,MOTRIN) 200 MG tablet Take 400 mg by mouth every 6 (six) hours as needed for moderate pain.   Yes Historical Provider, MD   BP 116/57  Pulse 54  Temp(Src) 99.1 F (37.3 C) (Oral)  Resp 18  SpO2 100% Physical Exam  Nursing note and vitals reviewed. Constitutional: She is oriented to person, place, and time. She appears well-developed and well-nourished.  HENT:  Head: Normocephalic and atraumatic.  Eyes: Conjunctivae and EOM are normal. Pupils are equal, round, and reactive to light.  Neck:  Normal range of motion. Neck supple.  Cardiovascular: Normal rate and regular rhythm.  Exam reveals no gallop and no friction rub.   No murmur heard. Pulmonary/Chest: Effort normal and breath sounds normal. No respiratory distress. She has no wheezes. She has no rales. She exhibits no tenderness.  Abdominal: Soft. Bowel sounds are normal. She exhibits no distension and no mass. There is tenderness. There is no rebound and no guarding.  Abdomen is diffusely uncomfortable, but no focal abdominal tenderness, no RLQ tenderness or pain at McBurney's point, no RUQ tenderness or Murphy's sign, no left-sided abdominal tenderness, no fluid wave, or signs of peritonitis   Musculoskeletal: Normal range of motion. She exhibits no edema and no tenderness.  Neurological: She is alert and oriented to person, place, and time.  Skin: Skin is warm and dry.  Psychiatric: She has a normal mood and affect. Her behavior is normal. Judgment and thought content normal.    ED Course  Procedures (including critical care time) Results for orders placed during the hospital encounter of 12/29/13  URINALYSIS, ROUTINE W REFLEX MICROSCOPIC      Result Value Ref Range   Color, Urine YELLOW  YELLOW   APPearance CLEAR  CLEAR   Specific Gravity, Urine 1.018  1.005 - 1.030   pH 5.5  5.0 - 8.0   Glucose, UA NEGATIVE  NEGATIVE mg/dL   Hgb urine dipstick NEGATIVE  NEGATIVE   Bilirubin Urine NEGATIVE  NEGATIVE   Ketones, ur NEGATIVE  NEGATIVE mg/dL   Protein, ur NEGATIVE  NEGATIVE mg/dL   Urobilinogen, UA 0.2  0.0 - 1.0 mg/dL   Nitrite NEGATIVE  NEGATIVE   Leukocytes, UA NEGATIVE  NEGATIVE  CBC WITH DIFFERENTIAL      Result Value Ref Range   WBC 6.3  4.0 - 10.5 K/uL   RBC 4.39  3.87 - 5.11 MIL/uL   Hemoglobin 11.8 (*) 12.0 - 15.0 g/dL   HCT 35.4 (*) 36.0 - 46.0 %   MCV 80.6  78.0 - 100.0 fL   MCH 26.9  26.0 - 34.0 pg   MCHC 33.3  30.0 - 36.0 g/dL   RDW 13.2  11.5 - 15.5 %   Platelets 259  150 - 400 K/uL    Neutrophils Relative % 57  43 - 77 %   Neutro Abs 3.6  1.7 - 7.7 K/uL   Lymphocytes Relative 29  12 - 46 %   Lymphs Abs 1.8  0.7 - 4.0 K/uL   Monocytes Relative 11  3 - 12 %   Monocytes Absolute 0.7  0.1 - 1.0 K/uL   Eosinophils Relative 3  0 -  5 %   Eosinophils Absolute 0.2  0.0 - 0.7 K/uL   Basophils Relative 0  0 - 1 %   Basophils Absolute 0.0  0.0 - 0.1 K/uL  COMPREHENSIVE METABOLIC PANEL      Result Value Ref Range   Sodium 141  137 - 147 mEq/L   Potassium 3.9  3.7 - 5.3 mEq/L   Chloride 105  96 - 112 mEq/L   CO2 23  19 - 32 mEq/L   Glucose, Bld 93  70 - 99 mg/dL   BUN 7  6 - 23 mg/dL   Creatinine, Ser 0.72  0.50 - 1.10 mg/dL   Calcium 9.4  8.4 - 10.5 mg/dL   Total Protein 7.4  6.0 - 8.3 g/dL   Albumin 4.0  3.5 - 5.2 g/dL   AST 14  0 - 37 U/L   ALT 11  0 - 35 U/L   Alkaline Phosphatase 69  39 - 117 U/L   Total Bilirubin 0.4  0.3 - 1.2 mg/dL   GFR calc non Af Amer >90  >90 mL/min   GFR calc Af Amer >90  >90 mL/min  LIPASE, BLOOD      Result Value Ref Range   Lipase 14  11 - 59 U/L  POC URINE PREG, ED      Result Value Ref Range   Preg Test, Ur NEGATIVE  NEGATIVE   No results found.   Imaging Review No results found.   EKG Interpretation None      MDM   Final diagnoses:  Nausea vomiting and diarrhea  Abdominal pain    Patient with abdominal pain, nausea, vomiting, and diarrhea.  VSS.  Afebrile.  Abdomen is benign on exam.  Will give fluids, treat pain and nausea, and fluid challenge.  Labs are unremarkable.  9:09 AM Patient seen by and discussed with Dr. Betsey Holiday, who has also reviewed the patient's labs, EKG and workup.  Patient is feeling better. Reassessed x3. She looks well.  Recommend follow-up with PCP.  Abdomen is benign on exam.  Discharge to home.  Dr. Betsey Holiday agrees with this plan.  Patient instructed to return for:  New or worsening symptoms, including, increased abdominal pain, especially pain that localizes to one side, bloody vomit, bloody  diarrhea, fever >101, and intractable vomiting.     Montine Circle, PA-C 12/29/13 [phone]

## 2014-01-03 ENCOUNTER — Inpatient Hospital Stay (HOSPITAL_COMMUNITY): Payer: Medicaid Other

## 2014-01-03 ENCOUNTER — Inpatient Hospital Stay (HOSPITAL_COMMUNITY)
Admission: AD | Admit: 2014-01-03 | Discharge: 2014-01-04 | Disposition: A | Discharge status: Home / Self Care | Payer: Medicaid Other | Source: Ambulatory Visit | Attending: Obstetrics and Gynecology | Admitting: Obstetrics and Gynecology

## 2014-01-03 ENCOUNTER — Encounter (HOSPITAL_COMMUNITY): Payer: Self-pay | Admitting: *Deleted

## 2014-01-03 DIAGNOSIS — R109 Unspecified abdominal pain: Secondary | ICD-10-CM | POA: Insufficient documentation

## 2014-01-03 DIAGNOSIS — D649 Anemia, unspecified: Secondary | ICD-10-CM | POA: Insufficient documentation

## 2014-01-03 DIAGNOSIS — R197 Diarrhea, unspecified: Secondary | ICD-10-CM | POA: Insufficient documentation

## 2014-01-03 DIAGNOSIS — D571 Sickle-cell disease without crisis: Secondary | ICD-10-CM | POA: Insufficient documentation

## 2014-01-03 LAB — URINALYSIS, ROUTINE W REFLEX MICROSCOPIC
Bilirubin Urine: NEGATIVE
GLUCOSE, UA: NEGATIVE mg/dL
Hgb urine dipstick: NEGATIVE
Ketones, ur: NEGATIVE mg/dL
Leukocytes, UA: NEGATIVE
Nitrite: NEGATIVE
Protein, ur: NEGATIVE mg/dL
UROBILINOGEN UA: 0.2 mg/dL (ref 0.0–1.0)
pH: 5 (ref 5.0–8.0)

## 2014-01-03 LAB — CBC WITH DIFFERENTIAL/PLATELET
Basophils Absolute: 0 10*3/uL (ref 0.0–0.1)
Basophils Relative: 0 % (ref 0–1)
Eosinophils Absolute: 0.4 10*3/uL (ref 0.0–0.7)
Eosinophils Relative: 5 % (ref 0–5)
HCT: 34 % — ABNORMAL LOW (ref 36.0–46.0)
Hemoglobin: 11.4 g/dL — ABNORMAL LOW (ref 12.0–15.0)
LYMPHS ABS: 2.4 10*3/uL (ref 0.7–4.0)
LYMPHS PCT: 34 % (ref 12–46)
MCH: 27.1 pg (ref 26.0–34.0)
MCHC: 33.5 g/dL (ref 30.0–36.0)
MCV: 80.8 fL (ref 78.0–100.0)
Monocytes Absolute: 0.5 10*3/uL (ref 0.1–1.0)
Monocytes Relative: 7 % (ref 3–12)
NEUTROS ABS: 3.7 10*3/uL (ref 1.7–7.7)
NEUTROS PCT: 53 % (ref 43–77)
PLATELETS: 256 10*3/uL (ref 150–400)
RBC: 4.21 MIL/uL (ref 3.87–5.11)
RDW: 13.5 % (ref 11.5–15.5)
WBC: 6.9 10*3/uL (ref 4.0–10.5)

## 2014-01-03 LAB — COMPREHENSIVE METABOLIC PANEL
ALT: 11 U/L (ref 0–35)
AST: 13 U/L (ref 0–37)
Albumin: 3.9 g/dL (ref 3.5–5.2)
Alkaline Phosphatase: 76 U/L (ref 39–117)
BUN: 8 mg/dL (ref 6–23)
CO2: 25 mEq/L (ref 19–32)
CREATININE: 0.69 mg/dL (ref 0.50–1.10)
Calcium: 8.9 mg/dL (ref 8.4–10.5)
Chloride: 101 mEq/L (ref 96–112)
GFR calc Af Amer: >90 mL/min (ref >90)
GLUCOSE: 90 mg/dL (ref 70–99)
POTASSIUM: 3.5 meq/L — AB (ref 3.7–5.3)
SODIUM: 140 meq/L (ref 137–147)
Total Bilirubin: 0.3 mg/dL (ref 0.3–1.2)
Total Protein: 6.8 g/dL (ref 6.0–8.3)

## 2014-01-03 LAB — POCT PREGNANCY, URINE: Preg Test, Ur: NEGATIVE

## 2014-01-03 LAB — LIPASE, BLOOD: LIPASE: 19 U/L (ref 11–59)

## 2014-01-03 LAB — AMYLASE: AMYLASE: 54 U/L (ref 0–105)

## 2014-01-03 MED ORDER — IOHEXOL 300 MG/ML  SOLN
100.0000 mL | Freq: Once | INTRAMUSCULAR | Status: AC | PRN
  Administered 2014-01-03: 100 mL via INTRAVENOUS

## 2014-01-03 MED ORDER — KETOROLAC TROMETHAMINE 60 MG/2ML IM SOLN
60.0000 mg | Freq: Once | INTRAMUSCULAR | Status: AC
  Administered 2014-01-03: 60 mg via INTRAMUSCULAR
  Filled 2014-01-03: qty 2

## 2014-01-03 MED ORDER — ONDANSETRON 8 MG PO TBDP
8.0000 mg | ORAL_TABLET | Freq: Once | ORAL | Status: AC
  Administered 2014-01-03: 8 mg via ORAL
  Filled 2014-01-03: qty 1

## 2014-01-03 MED ORDER — IOHEXOL 300 MG/ML  SOLN
50.0000 mL | INTRAMUSCULAR | Status: AC
  Administered 2014-01-03: 50 mL via ORAL

## 2014-01-03 NOTE — MAU Note (Signed)
Pt had miscarriage in February, has not had a period since then.

## 2014-01-03 NOTE — MAU Note (Signed)
Lower & upper abd pain since February, seen in MAU previously for this problem.  Denies bleeding, is having nausea, vomiting & diarrhea.  Diarrhea x 1 1/2 weeks, vomiting for last week.

## 2014-01-03 NOTE — MAU Provider Note (Signed)
History     CSN: [id number]  Arrival date and time: 01/03/14 1712   First Provider Initiated Contact with Patient 01/03/14 1953      No chief complaint on file.  HPI Comments: Pt is a 28yo [id number] arrives unannounced w c/o abdominal pain x2-3 mos and diarrhea about 10 days and vomiting for about 10 days. States she ate about 2hrs ago and also vomited after that. States she has diarrhea several times per day. She was seen at [facility] ED on 4/23 for same complaints, she was given IVF's and morphine and improved and was instructed to f/u w PCP. States at home she has tried motrin, advil and vicodin without any relief. She states her last period was in late March. Is not on any contraception. Denies any pelvic or urinary pain, or any discharge or unusual vaginal bleeding.      Past Medical History  Diagnosis Date  . Sickle cell trait   . Mental disorder   . Bipolar affective disorder   . Bipolar affective disorder 2006    TOOK MEDS X 3 YR  . Depression     PP 2013  . Abnormal Pap smear     LAST PAP 06/2011  . Asthma     INHAL[facility]; [facility]  . Infection     UTI  . Anemia     CHRONIC  . Headache(784.0)     SEVERAL TIMES/DAY  . Hepatitis 2011    HEPATITIS B ????/States negative 11/23/12    Past Surgical History  Procedure Laterality Date  . Mouth surgery    . Wisdom tooth extraction  2012  . Vaginal delivery      X 3  . Dilation and evacuation N/A 11/04/2013    Procedure: DILATATION AND EVACUATION;  Surgeon: Alwyn Pea, MD;  Location: [facility];  Service: Gynecology;  Laterality: N/A;    Family History  Problem Relation Age of Onset  . Hypertension Mother   . Asthma Mother   . Heart disease Mother   . Hyperlipidemia Mother   . Thyroid disease Mother   . Alcohol abuse Father   . Asthma Sister   . Depression Sister   . Diabetes Maternal Aunt   . HIV Maternal Aunt   . Cancer Maternal Grandmother 37    COLON  . Depression Maternal Grandmother   . Alcohol abuse  Maternal Grandmother   . Drug abuse Maternal Grandmother   . Alcohol abuse Maternal Grandfather   . Drug abuse Maternal Grandfather   . Stroke Maternal Grandfather   . Alcohol abuse Paternal Grandfather   . Drug abuse Paternal Grandfather   . Asthma Sister     History  Substance Use Topics  . Smoking status: Never Smoker   . Smokeless tobacco: Never Used  . Alcohol Use: No    Allergies: No Known Allergies  Prescriptions prior to admission  Medication Sig Dispense Refill  . acetaminophen (TYLENOL) 500 MG tablet Take 500 mg by mouth every 6 (six) hours as needed for moderate pain.      Marland Kitchen HYDROcodone-acetaminophen (NORCO/VICODIN) 5-325 MG per tablet Take 1 tablet by mouth every 6 (six) hours as needed for moderate pain.  30 tablet  0  . ibuprofen (ADVIL,MOTRIN) 200 MG tablet Take 400 mg by mouth every 6 (six) hours as needed for moderate pain.      . naproxen sodium (ANAPROX) 220 MG tablet Take 440 mg by mouth daily as needed (used for stomache pain.).      Marland Kitchen  ondansetron (ZOFRAN) 4 MG tablet Take 1 tablet (4 mg total) by mouth every 6 (six) hours.  12 tablet  0    Review of Systems  Constitutional: Negative for fever.  Gastrointestinal: Positive for vomiting, abdominal pain and diarrhea.  Genitourinary: Negative for dysuria and urgency.  All other systems reviewed and are negative.  Physical Exam   Blood pressure 113/64, pulse 92, temperature 98.9 F (37.2 C), resp. rate 18, height 5\' 2"  (1.575 m), weight 199 lb (90.266 kg), SpO2 100.00%, unknown if currently breastfeeding.  Physical Exam  Nursing note and vitals reviewed. Constitutional: She is oriented to person, place, and time. She appears well-developed and well-nourished. No distress.  HENT:  Head: Normocephalic.  Eyes: Pupils are equal, round, and reactive to light.  Neck: Normal range of motion.  Cardiovascular: Normal rate, regular rhythm and normal heart sounds.   Respiratory: Effort normal and breath sounds  normal.  GI: Soft. Bowel sounds are normal. She exhibits distension. She exhibits no mass. There is tenderness. There is no rebound and no guarding.  Mild diffuse tenderness in upper abdomen   Musculoskeletal: Normal range of motion. She exhibits no edema and no tenderness.  Neurological: She is alert and oriented to person, place, and time. She has normal reflexes.  Skin: Skin is warm and dry.  Psychiatric: She has a normal mood and affect. Her behavior is normal.   UPT neg UA neg   MAU Course  Procedures    Assessment and Plan  27yo abdominal pain  Will repeat labs that were done on 4/23, also check amylase and lipase and TSH Will do abdominal/pelvic CT w contrast  Give toradol 60mg  IM zofran 8mg  ODT  D/w DR Mancel Bale, will consult further when results returned   Charlane Ferretti 01/03/2014, 8:49 PM

## 2014-01-04 LAB — TSH: TSH: 1.25 u[IU]/mL (ref 0.350–4.500)

## 2014-01-04 NOTE — Discharge Instructions (Signed)
Abdominal Pain, Adult °Many things can cause abdominal pain. Usually, abdominal pain is not caused by a disease and will improve without treatment. It can often be observed and treated at home. Your health care provider will do a physical exam and possibly order blood tests and X-rays to help determine the seriousness of your pain. However, in many cases, more time must pass before a clear cause of the pain can be found. Before that point, your health care provider may not know if you need more testing or further treatment. °HOME CARE INSTRUCTIONS  °Monitor your abdominal pain for any changes. The following actions may help to alleviate any discomfort you are experiencing: °· Only take over-the-counter or prescription medicines as directed by your health care provider. °· Do not take laxatives unless directed to do so by your health care provider. °· Try a clear liquid diet (broth, tea, or water) as directed by your health care provider. Slowly move to a bland diet as tolerated. °SEEK MEDICAL CARE IF: °· You have unexplained abdominal pain. °· You have abdominal pain associated with nausea or diarrhea. °· You have pain when you urinate or have a bowel movement. °· You experience abdominal pain that wakes you in the night. °· You have abdominal pain that is worsened or improved by eating food. °· You have abdominal pain that is worsened with eating fatty foods. °SEEK IMMEDIATE MEDICAL CARE IF:  °· Your pain does not go away within 2 hours. °· You have a fever. °· You keep throwing up (vomiting). °· Your pain is felt only in portions of the abdomen, such as the right side or the left lower portion of the abdomen. °· You pass bloody or black tarry stools. °MAKE SURE YOU: °· Understand these instructions.   °· Will watch your condition.   °· Will get help right away if you are not doing well or get worse.   °Document Released: 06/04/2005 Document Revised: 06/15/2013 Document Reviewed: 05/04/2013 °[employer]® Patient  Information ©2014 [employer]. ° °

## 2014-03-21 ENCOUNTER — Encounter (HOSPITAL_COMMUNITY): Payer: Self-pay | Admitting: *Deleted

## 2014-03-21 ENCOUNTER — Inpatient Hospital Stay (HOSPITAL_COMMUNITY)
Admission: AD | Admit: 2014-03-21 | Discharge: 2014-03-21 | Disposition: A | Discharge status: Home / Self Care | Payer: Medicaid Other | Source: Ambulatory Visit | Attending: Obstetrics and Gynecology | Admitting: Obstetrics and Gynecology

## 2014-03-21 DIAGNOSIS — R109 Unspecified abdominal pain: Secondary | ICD-10-CM | POA: Insufficient documentation

## 2014-03-21 DIAGNOSIS — D573 Sickle-cell trait: Secondary | ICD-10-CM | POA: Insufficient documentation

## 2014-03-21 DIAGNOSIS — O99019 Anemia complicating pregnancy, unspecified trimester: Secondary | ICD-10-CM | POA: Insufficient documentation

## 2014-03-21 DIAGNOSIS — O99891 Other specified diseases and conditions complicating pregnancy: Secondary | ICD-10-CM | POA: Insufficient documentation

## 2014-03-21 DIAGNOSIS — O0930 Supervision of pregnancy with insufficient antenatal care, unspecified trimester: Secondary | ICD-10-CM | POA: Diagnosis not present

## 2014-03-21 DIAGNOSIS — O9989 Other specified diseases and conditions complicating pregnancy, childbirth and the puerperium: Principal | ICD-10-CM

## 2014-03-21 DIAGNOSIS — G44209 Tension-type headache, unspecified, not intractable: Secondary | ICD-10-CM | POA: Diagnosis not present

## 2014-03-21 HISTORY — DX: Unspecified abnormal cytological findings in specimens from vagina: R87.629

## 2014-03-21 LAB — URINALYSIS, ROUTINE W REFLEX MICROSCOPIC
Bilirubin Urine: NEGATIVE
Glucose, UA: NEGATIVE mg/dL
Ketones, ur: NEGATIVE mg/dL
Nitrite: NEGATIVE
Protein, ur: NEGATIVE mg/dL
Specific Gravity, Urine: 1.015 (ref 1.005–1.030)
UROBILINOGEN UA: 1 mg/dL (ref 0.0–1.0)
pH: 6 (ref 5.0–8.0)

## 2014-03-21 LAB — URINE MICROSCOPIC-ADD ON

## 2014-03-21 LAB — POCT PREGNANCY, URINE: PREG TEST UR: POSITIVE — AB

## 2014-03-21 MED ORDER — BUTALBITAL-APAP-CAFFEINE 50-325-40 MG PO TABS
2.0000 | ORAL_TABLET | Freq: Once | ORAL | Status: AC
  Administered 2014-03-21: 2 via ORAL
  Filled 2014-03-21: qty 2

## 2014-03-21 MED ORDER — BUTALBITAL-APAP-CAFFEINE 50-325-40 MG PO TABS: 1.0000 | ORAL_TABLET | Freq: Four times a day (QID) | ORAL | Status: DC | PRN

## 2014-03-21 NOTE — MAU Note (Signed)
Pt had pos UPT @ MD office, has been told she is 16 weeks, but does not have due date, no prenatal care.

## 2014-03-21 NOTE — Discharge Instructions (Signed)
Tension Headache A tension headache is pain, pressure, or aching felt over the front and sides of the head. Tension headaches often come after stress, feeling worried (anxiety), or feeling sad or down for a while (depressed). HOME CARE  Only take medicine as told by your doctor.  Lie down in a dark, quiet room when you have a headache.  Keep a journal to find out if certain things bring on headaches. For example, write down:  What you eat and drink.  How much sleep you get.  Any change to your diet or medicines.  Relax by getting a massage or doing other relaxing activities.  Put ice or heat packs on the head and neck area as told by your doctor.  Lessen stress.  Sit up straight. Do not tighten (tense) your muscles.  Quit smoking if you smoke.  Lessen how much alcohol you drink.  Lessen how much caffeine you drink, or stop drinking caffeine.  Eat and exercise regularly.  Get enough sleep.  Avoid using too much pain medicine. GET HELP RIGHT AWAY IF:   Your headache becomes really bad.  You have a fever.  You have a stiff neck.  You have trouble seeing.  Your muscles are weak, or you lose muscle control.  You lose your balance or have trouble walking.  You feel like you will pass out (faint), or you pass out.  You have really bad symptoms that are different than your first symptoms.  You have problems with the medicines given to you by your doctor.  Your medicines do not work.  Your headache feels different than the other headaches.  You feel sick to your stomach (nauseous) or throw up (vomit). MAKE SURE YOU:   Understand these instructions.  Will watch your condition.  Will get help right away if you are not doing well or get worse. Document Released: 11/19/2009 Document Revised: 11/17/2011 Document Reviewed: 08/15/2011 [facility] Patient Information 2015 [place], [place]. This information is not intended to replace advice given to you by your health  care provider. Make sure you discuss any questions you have with your health care provider. Pregnancy - First Trimester During sexual intercourse, millions of sperm go into the vagina. Only 1 sperm will penetrate and fertilize the female egg while it is in the Fallopian tube. One week later, the fertilized egg implants into the wall of the uterus. An embryo begins to develop into a baby. At 6 to 8 weeks, the eyes and face are formed and the heartbeat can be seen on ultrasound. At the end of 12 weeks (first trimester), all the baby's organs are formed. Now that you are pregnant, you will want to do everything you can to have a healthy baby. Two of the most important things are to get good prenatal care and follow your caregiver's instructions. Prenatal care is all the medical care you receive before the baby's birth. It is given to prevent, find, and treat problems during the pregnancy and childbirth. PRENATAL EXAMS  During prenatal visits, your weight, blood pressure, and urine are checked. This is done to make sure you are healthy and progressing normally during the pregnancy.  A pregnant woman should gain 25 to 35 pounds during the pregnancy. However, if you are overweight or underweight, your caregiver will advise you regarding your weight.  Your caregiver will ask and answer questions for you.  Blood work, cervical cultures, other necessary tests, and a Pap test are done during your prenatal exams. These tests are  done to check on your health and the probable health of your baby. Tests are strongly recommended and done for HIV with your permission. This is the virus that causes AIDS. These tests are done because medicines can be given to help prevent your baby from being born with this infection should you have been infected without knowing it. Blood work is also used to find out your blood type, previous infections, and follow your blood levels (hemoglobin).  Low hemoglobin (anemia) is common  during pregnancy. Iron and vitamins are given to help prevent this. Later in the pregnancy, blood tests for diabetes will be done along with any other tests if any problems develop.  You may need other tests to make sure you and the baby are doing well. CHANGES DURING THE FIRST TRIMESTER  Your body goes through many changes during pregnancy. They vary from person to person. Talk to your caregiver about changes you notice and are concerned about. Changes can include:  Your menstrual period stops.  The egg and sperm carry the genes that determine what you look like. Genes from you and your partner are forming a baby. The female genes determine whether the baby is a boy or a girl.  Your body increases in girth and you may feel bloated.  Feeling sick to your stomach (nauseous) and throwing up (vomiting). If the vomiting is uncontrollable, call your caregiver.  Your breasts will begin to enlarge and become tender.  Your nipples may stick out more and become darker.  The need to urinate more. Painful urination may mean you have a bladder infection.  Tiring easily.  Loss of appetite.  Cravings for certain kinds of food.  At first, you may gain or lose a couple of pounds.  You may have changes in your emotions from day to day (excited to be pregnant or concerned something may go wrong with the pregnancy and baby).  You may have more vivid and strange dreams. HOME CARE INSTRUCTIONS   It is very important to avoid all smoking, alcohol and non-prescribed drugs during your pregnancy. These affect the formation and growth of the baby. Avoid chemicals while pregnant to ensure the delivery of a healthy infant.  Start your prenatal visits by the 12th week of pregnancy. They are usually scheduled monthly at first, then more often in the last 2 months before delivery. Keep your caregiver's appointments. Follow your caregiver's instructions regarding medicine use, blood and lab tests, exercise, and  diet.  During pregnancy, you are providing food for you and your baby. Eat regular, well-balanced meals. Choose foods such as meat, fish, milk and other low fat dairy products, vegetables, fruits, and whole-grain breads and cereals. Your caregiver will tell you of the ideal weight gain.  You can help morning sickness by keeping soda crackers at the bedside. Eat a couple before arising in the morning. You may want to use the crackers without salt on them.  Eating 4 to 5 small meals rather than 3 large meals a day also may help the nausea and vomiting.  Drinking liquids between meals instead of during meals also seems to help nausea and vomiting.  A physical sexual relationship may be continued throughout pregnancy if there are no other problems. Problems may be early (premature) leaking of amniotic fluid from the membranes, vaginal bleeding, or belly (abdominal) pain.  Exercise regularly if there are no restrictions. Check with your caregiver or physical therapist if you are unsure of the safety of some of your exercises.  Greater weight gain will occur in the last 2 trimesters of pregnancy. Exercising will help:  Control your weight.  Keep you in shape.  Prepare you for labor and delivery.  Help you lose your pregnancy weight after you deliver your baby.  Wear a good support or jogging bra for breast tenderness during pregnancy. This may help if worn during sleep too.  Ask when prenatal classes are available. Begin classes when they are offered.  Do not use hot tubs, steam rooms, or saunas.  Wear your seat belt when driving. This protects you and your baby if you are in an accident.  Avoid raw meat, uncooked cheese, cat litter boxes, and soil used by cats throughout the pregnancy. These carry germs that can cause birth defects in the baby.  The first trimester is a good time to visit your dentist for your dental health. Getting your teeth cleaned is okay. Use a softer toothbrush and  brush gently during pregnancy.  Ask for help if you have financial, counseling, or nutritional needs during pregnancy. Your caregiver will be able to offer counseling for these needs as well as refer you for other special needs.  Do not take any medicines or herbs unless told by your caregiver.  Inform your caregiver if there is any mental or physical domestic violence.  Make a list of emergency phone numbers of family, friends, hospital, and police and fire departments.  Write down your questions. Take them to your prenatal visit.  Do not douche.  Do not cross your legs.  If you have to stand for long periods of time, rotate you feet or take small steps in a circle.  You may have more vaginal secretions that may require a sanitary pad. Do not use tampons or scented sanitary pads. MEDICINES AND DRUG USE IN PREGNANCY  Take prenatal vitamins as directed. The vitamin should contain 1 milligram of folic acid. Keep all vitamins out of reach of children. Only a couple vitamins or tablets containing iron may be fatal to a baby or young child when ingested.  Avoid use of all medicines, including herbs, over-the-counter medicines, not prescribed or suggested by your caregiver. Only take over-the-counter or prescription medicines for pain, discomfort, or fever as directed by your caregiver. Do not use aspirin, ibuprofen, or naproxen unless directed by your caregiver.  Let your caregiver also know about herbs you may be using.  Alcohol is related to a number of birth defects. This includes fetal alcohol syndrome. All alcohol, in any form, should be avoided completely. Smoking will cause low birth rate and premature babies.  Street or illegal drugs are very harmful to the baby. They are absolutely forbidden. A baby born to an addicted mother will be addicted at birth. The baby will go through the same withdrawal an adult does.  Let your caregiver know about any medicines that you have to take and  for what reason you take them. SEEK MEDICAL CARE IF:  You have any concerns or worries during your pregnancy. It is better to call with your questions if you feel they cannot wait, rather than worry about them. SEEK IMMEDIATE MEDICAL CARE IF:   An unexplained oral temperature above 102 F (38.9 C) develops, or as your caregiver suggests.  You have leaking of fluid from the vagina (birth canal). If leaking membranes are suspected, take your temperature and inform your caregiver of this when you call.  There is vaginal spotting or bleeding. Notify your caregiver of the amount and  how many pads are used.  You develop a bad smelling vaginal discharge with a change in the color.  You continue to feel sick to your stomach (nauseated) and have no relief from remedies suggested. You vomit blood or coffee ground-like materials.  You lose more than 2 pounds of weight in 1 week.  You gain more than 2 pounds of weight in 1 week and you notice swelling of your face, hands, feet, or legs.  You gain 5 pounds or more in 1 week (even if you do not have swelling of your hands, face, legs, or feet).  You get exposed to [place] measles and have never had them.  You are exposed to fifth disease or chickenpox.  You develop belly (abdominal) pain. Round ligament discomfort is a common non-cancerous (benign) cause of abdominal pain in pregnancy. Your caregiver still must evaluate this.  You develop headache, fever, diarrhea, pain with urination, or shortness of breath.  You fall or are in a car accident or have any kind of trauma.  There is mental or physical violence in your home. Document Released: 08/19/2001 Document Revised: 05/19/2012 Document Reviewed: 07/05/2013 [facility] Patient Information 2015 [place], [place]. This information is not intended to replace advice given to you by your health care provider. Make sure you discuss any questions you have with your health care provider.

## 2014-03-21 NOTE — MAU Note (Signed)
HA x 2 days, unrelieved with meds.  Began having sharp upper abd pain today.  Denies bleeding or LOF.

## 2014-03-21 NOTE — MAU Provider Note (Signed)
None     Chief Complaint:  Headache and Abdominal Pain   Lindsay Ramirez is  28 y.o. [id number] at ~[redacted]w[redacted]d by bedside US presents complaining of Headache and Abdominal Pain Lindsay Ramirez is a 28 y.o. [id number] at 10+1 by bedside US today presents with complaints of headache for the last two days across temples, report 4/10 ache that has not improved with motrin, naprosyn or tylenol. Pt states that she is sensitvie to bright lights and loud noises. Pt states that she also is having sharp right upper abdominal pain that lasts seconds and occurs every couple of hours. Not a/w food, position or other obvious trigger. Does not shoot to back. Pt states that it started today and has not changed in intensity or frequency. Mild nausea at baseline, no vomitting, no d/c, urinary symptoms, f/c, anorexia or other symptoms.  She states no contractions, vaginal bleeding, or leakage of fluid, no FM   Obstetrical/Gynecological History: OB History   Grav Para Term Preterm Abortions TAB SAB Ect Mult Living   6 4 3 1 1  0 1 0 0 4     Past Medical History: Past Medical History  Diagnosis Date  . Sickle cell trait   . Mental disorder   . Bipolar affective disorder   . Bipolar affective disorder 2006    TOOK MEDS X 3 YR  . Depression     PP 2013  . Abnormal Pap smear     LAST PAP 06/2011  . Asthma     [facility]; [facility]  . Infection     UTI  . Anemia     CHRONIC  . Headache(784.0)     SEVERAL TIMES/DAY  . Hepatitis 2011    HEPATITIS B ????/States negative 11/23/12  . Vaginal Pap smear, abnormal     Past Surgical History: Past Surgical History  Procedure Laterality Date  . Mouth surgery    . Wisdom tooth extraction  2012  . Vaginal delivery      X 3  . Dilation and evacuation N/A 11/04/2013    Procedure: DILATATION AND EVACUATION;  Surgeon: Alwyn Pea, MD;  Location: [facility];  Service: Gynecology;  Laterality: N/A;    Family History: Family History  Problem Relation Age of Onset   . Hypertension Mother   . Asthma Mother   . Heart disease Mother   . Hyperlipidemia Mother   . Thyroid disease Mother   . Alcohol abuse Father   . Asthma Sister   . Depression Sister   . Diabetes Maternal Aunt   . HIV Maternal Aunt   . Cancer Maternal Grandmother 37    COLON  . Depression Maternal Grandmother   . Alcohol abuse Maternal Grandmother   . Drug abuse Maternal Grandmother   . Alcohol abuse Maternal Grandfather   . Drug abuse Maternal Grandfather   . Stroke Maternal Grandfather   . Alcohol abuse Paternal Grandfather   . Drug abuse Paternal Grandfather   . Asthma Sister     Social History: History  Substance Use Topics  . Smoking status: Never Smoker   . Smokeless tobacco: Never Used  . Alcohol Use: No    Allergies: No Known Allergies  Meds:  Prescriptions prior to admission  Medication Sig Dispense Refill  . acetaminophen (TYLENOL) 500 MG tablet Take 500 mg by mouth every 6 (six) hours as needed for moderate pain.      Marland Kitchen ibuprofen (ADVIL,MOTRIN) 200 MG tablet Take 400 mg by mouth every 6 (  six) hours as needed for moderate pain.      . naproxen sodium (ANAPROX) 220 MG tablet Take 440 mg by mouth 2 (two) times daily with a meal.        Review of Systems -   Review of Systems  Constitutional: Negative for fever, chills, weight loss, malaise/fatigue and diaphoresis.  HENT: Negative for hearing loss, ear pain, nosebleeds, congestion, sore throat, neck pain, tinnitus and ear discharge.   Eyes: Negative for blurred vision, double vision, photophobia, pain, discharge and redness.  Respiratory: Negative for cough, hemoptysis, sputum production, shortness of breath, wheezing and stridor.   Cardiovascular: Negative for chest pain, palpitations, orthopnea,  leg swelling  Gastrointestinal: Negative for abdominal pain heartburn, nausea, vomiting, diarrhea, constipation, blood in stool Genitourinary: Negative for dysuria, urgency, frequency, hematuria and flank pain.   Musculoskeletal: Negative for myalgias, back pain, joint pain and falls.  Skin: Negative for itching and rash.  Neurological: Negative for dizziness, tingling, tremors, sensory change, speech change, focal weakness, seizures, loss of consciousness, weakness and headaches.  Endo/Heme/Allergies: Negative for environmental allergies and polydipsia. Does not bruise/bleed easily.  Psychiatric/Behavioral: Negative for depression, suicidal ideas, hallucinations, memory loss and substance abuse. The patient is not nervous/anxious and does not have insomnia.      Physical Exam  Blood pressure 113/67, pulse 69, temperature 99.1 F (37.3 C), temperature source Oral, resp. rate 18, height 5\' 2"  (1.575 m), weight 90.992 kg (200 lb 9.6 oz), last menstrual period 08/08/2013, unknown if currently breastfeeding. GENERAL: Well-developed, well-nourished female in no acute distress.  LUNGS: Clear to auscultation bilaterally.  HEART: Regular rate and rhythm. ABDOMEN: Soft, nontender, nondistended, gravid.  EXTREMITIES: Nontender, no edema, 2+ distal pulses. CN grossly intact, ambulating without difficulty Bedside US confirmed heart beat and fetal movement. Did not measure Fetal heart rate. CRL ~10w+1d    Labs: Results for orders placed during the hospital encounter of 03/21/14 (from the past 24 hour(s))  POCT PREGNANCY, URINE   Collection Time    03/21/14  6:33 PM      Result Value Ref Range   Preg Test, Ur POSITIVE (*) NEGATIVE   Imaging Studies:  No results found.  Assessment: BRIANNY Ramirez is  29 y.o. [id number] at Unknown presents with likely tension headache and stomach cramping.  #Tension Headache: tx with fioricet today, pt states she does not want to wait for resolution of headache and will take and go home Fiorecet #20 for PRN headache relief. - declined aggressive hydration, steroids, compazine, benadryl cocktail  #abdominal cramp: unlikely gall bladder or pancreas given lasting only  seconds. Possibly related to diet or nauesea in pregnancy. No evdience of infection or severe pathology. Sx not present at time of exam. Recommended return for worsening symptoms but watchful waiting appropriate with lack of severe sx. Pt may trial tums or mylanta PRn for pain.   Pt states understanding and will return for worsening symptoms or other concerns per patient.  Fredrik Rigger 7/14/20157:21 PM

## 2014-03-21 NOTE — MAU Note (Signed)
Unable to obtain FHR in triage. 

## 2014-03-21 NOTE — MAU Note (Signed)
FHT visualized by bedside ultrasound per Dr. Leslie Andrea

## 2014-03-23 ENCOUNTER — Inpatient Hospital Stay (HOSPITAL_COMMUNITY)
Admission: AD | Admit: 2014-03-23 | Discharge: 2014-03-23 | Disposition: A | Discharge status: Home / Self Care | Payer: Medicaid Other | Source: Ambulatory Visit | Attending: Obstetrics and Gynecology | Admitting: Obstetrics and Gynecology

## 2014-03-23 ENCOUNTER — Encounter (HOSPITAL_COMMUNITY): Payer: Self-pay

## 2014-03-23 DIAGNOSIS — O99891 Other specified diseases and conditions complicating pregnancy: Secondary | ICD-10-CM | POA: Diagnosis not present

## 2014-03-23 DIAGNOSIS — R51 Headache: Secondary | ICD-10-CM | POA: Insufficient documentation

## 2014-03-23 DIAGNOSIS — O9989 Other specified diseases and conditions complicating pregnancy, childbirth and the puerperium: Principal | ICD-10-CM

## 2014-03-23 DIAGNOSIS — H538 Other visual disturbances: Secondary | ICD-10-CM | POA: Insufficient documentation

## 2014-03-23 DIAGNOSIS — R519 Headache, unspecified: Secondary | ICD-10-CM

## 2014-03-23 LAB — URINALYSIS, ROUTINE W REFLEX MICROSCOPIC
BILIRUBIN URINE: NEGATIVE
Glucose, UA: NEGATIVE mg/dL
Hgb urine dipstick: NEGATIVE
Ketones, ur: NEGATIVE mg/dL
LEUKOCYTES UA: NEGATIVE
Nitrite: NEGATIVE
Protein, ur: NEGATIVE mg/dL
Specific Gravity, Urine: 1.01 (ref 1.005–1.030)
Urobilinogen, UA: 1 mg/dL (ref 0.0–1.0)
pH: 7 (ref 5.0–8.0)

## 2014-03-23 MED ORDER — SODIUM CHLORIDE 0.9 % IV SOLN
Freq: Once | INTRAVENOUS | Status: AC
  Administered 2014-03-23: 19:00:00 via INTRAVENOUS

## 2014-03-23 MED ORDER — CYCLOBENZAPRINE HCL 10 MG PO TABS: 10.0000 mg | ORAL_TABLET | Freq: Three times a day (TID) | ORAL | Status: DC | PRN

## 2014-03-23 MED ORDER — DIPHENHYDRAMINE HCL 50 MG/ML IJ SOLN
25.0000 mg | Freq: Once | INTRAMUSCULAR | Status: AC
  Administered 2014-03-23: 25 mg via INTRAVENOUS
  Filled 2014-03-23: qty 1

## 2014-03-23 MED ORDER — DEXAMETHASONE SODIUM PHOSPHATE 10 MG/ML IJ SOLN
10.0000 mg | Freq: Once | INTRAMUSCULAR | Status: AC
  Administered 2014-03-23: 10 mg via INTRAVENOUS
  Filled 2014-03-23: qty 1

## 2014-03-23 MED ORDER — PROCHLORPERAZINE EDISYLATE 5 MG/ML IJ SOLN
10.0000 mg | Freq: Once | INTRAMUSCULAR | Status: AC
  Administered 2014-03-23: 10 mg via INTRAVENOUS
  Filled 2014-03-23: qty 2

## 2014-03-23 NOTE — MAU Provider Note (Signed)
Attestation of Attending Supervision of Advanced Practitioner (CNM/NP): Evaluation and management procedures were performed by the Advanced Practitioner under my supervision and collaboration. I have reviewed the Advanced Practitioner's note and chart, and I agree with the management and plan.  Saskia Simerson H. 7:38 PM

## 2014-03-23 NOTE — MAU Note (Signed)
Pt reports she has had a headache since yesterday. Seen by provider yesterday given some medication but it is not working. Pt stated headache feels worse today.

## 2014-03-23 NOTE — MAU Provider Note (Signed)
Attestation of Attending Supervision of Advanced Practitioner (CNM/NP): Evaluation and management procedures were performed by the Advanced Practitioner under my supervision and collaboration.  I have reviewed the Advanced Practitioner's note and chart, and I agree with the management and plan.  Merit Maybee 03/23/2014 9:20 AM

## 2014-03-23 NOTE — MAU Provider Note (Signed)
History     CSN: [id number]  Arrival date and time: 03/23/14 1621   First Provider Initiated Contact with Patient 03/23/14 1805      Chief Complaint  Patient presents with  . Headache   Headache  Associated symptoms include blurred vision and photophobia. Pertinent negatives include no fever, nausea or vomiting.    Pt is a 28 yo [id number] at [redacted]w[redacted]d wks IUP here with report of headache that started yesterday.  Seen in MAU and given RX for Fioricet.  Pt states has not experienced relief from headache.  Headache is described as a sharp pain that radiates from ears to frontal area.  +photophobia.  Denies nausea or vomiting.  No abdominal pain or vaginal bleeding.    Past Medical History  Diagnosis Date  . Sickle cell trait   . Mental disorder   . Bipolar affective disorder   . Bipolar affective disorder 2006    TOOK MEDS X 3 YR  . Depression     PP 2013  . Abnormal Pap smear     LAST PAP 06/2011  . Asthma     [facility]; [facility]  . Infection     UTI  . Anemia     CHRONIC  . Headache(784.0)     SEVERAL TIMES/DAY  . Hepatitis 2011    HEPATITIS B ????/States negative 11/23/12  . Vaginal Pap smear, abnormal     Past Surgical History  Procedure Laterality Date  . Mouth surgery    . Wisdom tooth extraction  2012  . Vaginal delivery      X 3  . Dilation and evacuation N/A 11/04/2013    Procedure: DILATATION AND EVACUATION;  Surgeon: Alwyn Pea, MD;  Location: [facility];  Service: Gynecology;  Laterality: N/A;    Family History  Problem Relation Age of Onset  . Hypertension Mother   . Asthma Mother   . Heart disease Mother   . Hyperlipidemia Mother   . Thyroid disease Mother   . Alcohol abuse Father   . Asthma Sister   . Depression Sister   . Diabetes Maternal Aunt   . HIV Maternal Aunt   . Cancer Maternal Grandmother 37    COLON  . Depression Maternal Grandmother   . Alcohol abuse Maternal Grandmother   . Drug abuse Maternal Grandmother   . Alcohol abuse  Maternal Grandfather   . Drug abuse Maternal Grandfather   . Stroke Maternal Grandfather   . Alcohol abuse Paternal Grandfather   . Drug abuse Paternal Grandfather   . Asthma Sister     History  Substance Use Topics  . Smoking status: Never Smoker   . Smokeless tobacco: Never Used  . Alcohol Use: No    Allergies: No Known Allergies  Prescriptions prior to admission  Medication Sig Dispense Refill  . acetaminophen (TYLENOL) 500 MG tablet Take 500 mg by mouth every 6 (six) hours as needed for moderate pain.      . butalbital-acetaminophen-caffeine (FIORICET) 50-325-40 MG per tablet Take 1-2 tablets by mouth every 6 (six) hours as needed for headache.  20 tablet  0    Review of Systems  Constitutional: Negative for fever and chills.  Eyes: Positive for blurred vision and photophobia.  Gastrointestinal: Negative for nausea and vomiting.  Neurological: Positive for headaches.  All other systems reviewed and are negative.  Physical Exam   Blood pressure 116/66, pulse 68, temperature 98.8 F (37.1 C), temperature source Oral, resp. rate 18, height 5\' 2"  (1.575  m), weight 91.173 kg (201 lb), unknown if currently breastfeeding.  Physical Exam  Constitutional: She is oriented to person, place, and time. She appears well-developed and well-nourished.  HENT:  Head: Normocephalic.  Eyes: Pupils are equal, round, and reactive to light.  Neck: Normal range of motion. Neck supple. No Kernig's sign noted.  Cardiovascular: Normal rate, regular rhythm and normal heart sounds.   Respiratory: Effort normal and breath sounds normal.  Genitourinary: Guaiac stool:    No bleeding around the vagina.  Neurological: She is alert and oriented to person, place, and time.  Skin: Skin is warm and dry.    MAU Course  Procedures  Received migraine headache cocktail (benadryl 25 mg IV, Compazine 10 mg IV, Decadron 10 mg IV) > reports relief. Assessment and Plan  28 yo [id number] at [redacted]w[redacted]d wks  IUP Headache  Plan: Discharge to home RX Flexeril Assist patient with getting appointment at the [facility], [place] 03/23/2014, 6:08 PM

## 2014-03-25 ENCOUNTER — Inpatient Hospital Stay (HOSPITAL_COMMUNITY): Payer: Medicaid Other

## 2014-03-25 ENCOUNTER — Inpatient Hospital Stay (HOSPITAL_COMMUNITY)
Admission: AD | Admit: 2014-03-25 | Discharge: 2014-03-25 | Disposition: A | Discharge status: Home / Self Care | Payer: Medicaid Other | Source: Ambulatory Visit | Attending: Obstetrics & Gynecology | Admitting: Obstetrics & Gynecology

## 2014-03-25 ENCOUNTER — Encounter (HOSPITAL_COMMUNITY): Payer: Self-pay | Admitting: *Deleted

## 2014-03-25 DIAGNOSIS — G43001 Migraine without aura, not intractable, with status migrainosus: Secondary | ICD-10-CM

## 2014-03-25 DIAGNOSIS — G43909 Migraine, unspecified, not intractable, without status migrainosus: Secondary | ICD-10-CM | POA: Insufficient documentation

## 2014-03-25 DIAGNOSIS — D573 Sickle-cell trait: Secondary | ICD-10-CM | POA: Insufficient documentation

## 2014-03-25 DIAGNOSIS — O99891 Other specified diseases and conditions complicating pregnancy: Secondary | ICD-10-CM | POA: Diagnosis not present

## 2014-03-25 DIAGNOSIS — G43009 Migraine without aura, not intractable, without status migrainosus: Secondary | ICD-10-CM | POA: Diagnosis present

## 2014-03-25 DIAGNOSIS — O99019 Anemia complicating pregnancy, unspecified trimester: Secondary | ICD-10-CM | POA: Insufficient documentation

## 2014-03-25 DIAGNOSIS — O9989 Other specified diseases and conditions complicating pregnancy, childbirth and the puerperium: Principal | ICD-10-CM

## 2014-03-25 LAB — URINALYSIS, ROUTINE W REFLEX MICROSCOPIC
Bilirubin Urine: NEGATIVE
Glucose, UA: NEGATIVE mg/dL
HGB URINE DIPSTICK: NEGATIVE
Ketones, ur: NEGATIVE mg/dL
LEUKOCYTES UA: NEGATIVE
NITRITE: NEGATIVE
Protein, ur: NEGATIVE mg/dL
Specific Gravity, Urine: 1.02 (ref 1.005–1.030)
UROBILINOGEN UA: 0.2 mg/dL (ref 0.0–1.0)
pH: 6 (ref 5.0–8.0)

## 2014-03-25 MED ORDER — OXYCODONE-ACETAMINOPHEN 5-325 MG PO TABS: 2.0000 | ORAL_TABLET | ORAL | Status: DC | PRN

## 2014-03-25 MED ORDER — PROMETHAZINE HCL 25 MG PO TABS
25.0000 mg | ORAL_TABLET | Freq: Once | ORAL | Status: AC
  Administered 2014-03-25: 25 mg via ORAL
  Filled 2014-03-25: qty 1

## 2014-03-25 MED ORDER — OXYCODONE-ACETAMINOPHEN 5-325 MG PO TABS
1.0000 | ORAL_TABLET | Freq: Once | ORAL | Status: AC
  Administered 2014-03-25: 1 via ORAL
  Filled 2014-03-25: qty 1

## 2014-03-25 MED ORDER — SUMATRIPTAN SUCCINATE 100 MG PO TABS
100.0000 mg | ORAL_TABLET | Freq: Once | ORAL | Status: AC
  Administered 2014-03-25: 100 mg via ORAL
  Filled 2014-03-25: qty 1

## 2014-03-25 MED ORDER — BUTALBITAL-APAP-CAFFEINE 50-325-40 MG PO TABS: 1.0000 | ORAL_TABLET | Freq: Four times a day (QID) | ORAL | Status: DC | PRN

## 2014-03-25 MED ORDER — PROMETHAZINE HCL 25 MG PO TABS: 25.0000 mg | ORAL_TABLET | Freq: Once | ORAL | Status: DC

## 2014-03-25 MED ORDER — PROCHLORPERAZINE EDISYLATE 5 MG/ML IJ SOLN
10.0000 mg | Freq: Once | INTRAMUSCULAR | Status: AC
  Administered 2014-03-25: 10 mg via INTRAMUSCULAR
  Filled 2014-03-25: qty 2

## 2014-03-25 MED ORDER — METOCLOPRAMIDE HCL 10 MG PO TABS
10.0000 mg | ORAL_TABLET | Freq: Once | ORAL | Status: AC
  Administered 2014-03-25: 10 mg via ORAL
  Filled 2014-03-25: qty 1

## 2014-03-25 MED ORDER — SERTRALINE HCL 50 MG PO TABS: 50.0000 mg | ORAL_TABLET | Freq: Every day | ORAL | Status: DC

## 2014-03-25 MED ORDER — METOCLOPRAMIDE HCL 10 MG PO TABS: 10.0000 mg | ORAL_TABLET | Freq: Four times a day (QID) | ORAL | Status: DC

## 2014-03-25 NOTE — MAU Note (Signed)
Has headache x 4 days; states has had no relief; been seen in MAU twice since headache started. Denies abdominal pain or vaginal bleeding.

## 2014-03-25 NOTE — MAU Provider Note (Signed)
History     CSN: [id number]  Arrival date and time: 03/25/14 1749   First Provider Initiated Contact with Patient 03/25/14 1837      Chief Complaint  Patient presents with  . Headache   HPI Comments: Lindsay Ramirez 28 y.o. [id number] [redacted]w[redacted]d presents to MAU with migraine headache without aura. She has been to MAU twice in last week for same and the headache has not resolved. She has been given Fioricet and flexeril and headache cocktail with no relief. She is convinced she must have a CT of her head. Her mother has migraine. She has a history of Bipolar and is not on any medications for that disorder.    Headache  Associated symptoms include nausea and photophobia.      Past Medical History  Diagnosis Date  . Sickle cell trait   . Mental disorder   . Bipolar affective disorder   . Bipolar affective disorder 2006    TOOK MEDS X 3 YR  . Depression     PP 2013  . Abnormal Pap smear     LAST PAP 06/2011  . Asthma     [facility]; [facility]  . Infection     UTI  . Anemia     CHRONIC  . Headache(784.0)     SEVERAL TIMES/DAY  . Hepatitis 2011    HEPATITIS B ????/States negative 11/23/12  . Vaginal Pap smear, abnormal     Past Surgical History  Procedure Laterality Date  . Mouth surgery    . Wisdom tooth extraction  2012  . Vaginal delivery      X 3  . Dilation and evacuation N/A 11/04/2013    Procedure: DILATATION AND EVACUATION;  Surgeon: Alwyn Pea, MD;  Location: [facility];  Service: Gynecology;  Laterality: N/A;    Family History  Problem Relation Age of Onset  . Hypertension Mother   . Asthma Mother   . Heart disease Mother   . Hyperlipidemia Mother   . Thyroid disease Mother   . Alcohol abuse Father   . Asthma Sister   . Depression Sister   . Diabetes Maternal Aunt   . HIV Maternal Aunt   . Cancer Maternal Grandmother 37    COLON  . Depression Maternal Grandmother   . Alcohol abuse Maternal Grandmother   . Drug abuse Maternal Grandmother   .  Alcohol abuse Maternal Grandfather   . Drug abuse Maternal Grandfather   . Stroke Maternal Grandfather   . Alcohol abuse Paternal Grandfather   . Drug abuse Paternal Grandfather   . Asthma Sister     History  Substance Use Topics  . Smoking status: Never Smoker   . Smokeless tobacco: Never Used  . Alcohol Use: No    Allergies: No Known Allergies  Prescriptions prior to admission  Medication Sig Dispense Refill  . acetaminophen (TYLENOL) 500 MG tablet Take 1,000 mg by mouth every 6 (six) hours as needed for headache.       . cyclobenzaprine (FLEXERIL) 10 MG tablet Take 1 tablet (10 mg total) by mouth 3 (three) times daily as needed for muscle spasms.  30 tablet  2  . naproxen sodium (ANAPROX) 220 MG tablet Take 440 mg by mouth 2 (two) times daily as needed (for headache.).         Review of Systems  Constitutional: Negative.   Eyes: Positive for photophobia.  Gastrointestinal: Positive for nausea.  Genitourinary: Negative.   Musculoskeletal: Negative.   Neurological: Positive for  headaches.  Psychiatric/Behavioral: The patient is nervous/anxious.    Physical Exam   Blood pressure 109/57, pulse 65, temperature 99.5 F (37.5 C), temperature source Oral, resp. rate 16, height 5\' 2"  (1.575 m), weight 93.078 kg (205 lb 3.2 oz), SpO2 100.00%, not currently breastfeeding.  Physical Exam  Constitutional: She is oriented to person, place, and time. She appears well-developed and well-nourished. She appears distressed.  Appears uncomfortable  HENT:  Head: Normocephalic and atraumatic.  photophobic  Eyes: Pupils are equal, round, and reactive to light.  Neck: Normal range of motion.  Cardiovascular: Normal rate, regular rhythm and normal heart sounds.   Respiratory: Effort normal and breath sounds normal. No respiratory distress. She has no wheezes. She has no rales.  GI: Soft. She exhibits no distension. There is no tenderness.  Musculoskeletal: Normal range of motion.   Neurological: She is alert and oriented to person, place, and time. Coordination normal.  Skin: Skin is warm and dry.  Psychiatric: She has a normal mood and affect. Her behavior is normal. Judgment and thought content normal.    MAU Course  Procedures  MDM CT of head Imitrex, Phenergan, Percocet Ct Head Wo Contrast  03/25/2014   CLINICAL DATA:  Headache 4 days  EXAM: CT HEAD WITHOUT CONTRAST  TECHNIQUE: Contiguous axial images were obtained from the base of the skull through the vertex without intravenous contrast.  COMPARISON:  None.  FINDINGS: There is no evidence of mass effect, midline shift or extra-axial fluid collections. There is no evidence of a space-occupying lesion or intracranial hemorrhage. There is no evidence of a cortical-based area of acute infarction.  The ventricles and sulci are appropriate for the patient's age. The basal cisterns are patent.  Visualized portions of the orbits are unremarkable. The visualized portions of the paranasal sinuses and mastoid air cells are unremarkable.  The osseous structures are unremarkable.  IMPRESSION: No acute intracranial pathology.   Electronically Signed   By: Kathreen Devoid   On: 03/25/2014 20:11  discussed results with pt- pt states headache is still an 8/10- worse on left side than right Will give another dose of Percocet and give Reglan 10 PO Discussed with Dr. Ihor Dow who did not have any other options and advised consult with [facility] ED Dr. Wilson Singer consulted at [facility] ED- stated that we had done what he would have recommended except for giving Toradol if the pt was not pregnant- one option would be to use Haldol off label- or given magnesium IV- pharmacy consulted- Haldol not available at [facility] 10mg  IM ordered Discussed with pt and husband options Husband said pt had had hx of depression and had been on zoloft but had stopped about 2 months ago and was wondering if this played into the pt's headaches Assessment  and Plan  A: Migraine without aura Hx of bipolar previously on Zofoft- Rx for Zoloft 50mg  to start tomorrow and f/u with OB provider  P: Above orders Advised to make an appointment with Luna Glasgow, NP at [facility]  Turned pt over to Sherolyn Buba, NP at 8pm Pt advised to start OB care and could obtain OB care also at [facility], [place] 03/25/2014, 7:03 PM  Sherolyn Buba

## 2014-03-26 NOTE — MAU Provider Note (Signed)
Attestation of Attending Supervision of Advanced Practitioner (CNM/NP): Evaluation and management procedures were performed by the Advanced Practitioner under my supervision and collaboration.  I have reviewed the Advanced Practitioner's note and chart, and I agree with the management and plan.  HARRAWAY-SMITH, Xin Klawitter 5:12 AM

## 2014-04-06 ENCOUNTER — Encounter (HOSPITAL_COMMUNITY): Payer: Self-pay

## 2014-04-06 ENCOUNTER — Inpatient Hospital Stay (HOSPITAL_COMMUNITY)
Admission: AD | Admit: 2014-04-06 | Discharge: 2014-04-06 | Disposition: A | Discharge status: Home / Self Care | Payer: Medicaid Other | Source: Ambulatory Visit | Attending: Obstetrics and Gynecology | Admitting: Obstetrics and Gynecology

## 2014-04-06 DIAGNOSIS — G43909 Migraine, unspecified, not intractable, without status migrainosus: Secondary | ICD-10-CM | POA: Diagnosis not present

## 2014-04-06 DIAGNOSIS — O99340 Other mental disorders complicating pregnancy, unspecified trimester: Secondary | ICD-10-CM | POA: Diagnosis not present

## 2014-04-06 DIAGNOSIS — O210 Mild hyperemesis gravidarum: Secondary | ICD-10-CM | POA: Diagnosis present

## 2014-04-06 DIAGNOSIS — O219 Vomiting of pregnancy, unspecified: Secondary | ICD-10-CM

## 2014-04-06 DIAGNOSIS — G43009 Migraine without aura, not intractable, without status migrainosus: Secondary | ICD-10-CM

## 2014-04-06 DIAGNOSIS — F3289 Other specified depressive episodes: Secondary | ICD-10-CM

## 2014-04-06 DIAGNOSIS — F32A Depression, unspecified: Secondary | ICD-10-CM

## 2014-04-06 DIAGNOSIS — F329 Major depressive disorder, single episode, unspecified: Secondary | ICD-10-CM | POA: Insufficient documentation

## 2014-04-06 LAB — URINALYSIS, ROUTINE W REFLEX MICROSCOPIC
BILIRUBIN URINE: NEGATIVE
GLUCOSE, UA: NEGATIVE mg/dL
HGB URINE DIPSTICK: NEGATIVE
Ketones, ur: NEGATIVE mg/dL
Leukocytes, UA: NEGATIVE
Nitrite: NEGATIVE
PROTEIN: NEGATIVE mg/dL
Specific Gravity, Urine: 1.01 (ref 1.005–1.030)
UROBILINOGEN UA: 0.2 mg/dL (ref 0.0–1.0)
pH: 7.5 (ref 5.0–8.0)

## 2014-04-06 MED ORDER — SERTRALINE HCL 100 MG PO TABS: 100.0000 mg | ORAL_TABLET | Freq: Every day | ORAL | Status: DC

## 2014-04-06 MED ORDER — PROMETHAZINE HCL 25 MG PO TABS: 25.0000 mg | ORAL_TABLET | Freq: Four times a day (QID) | ORAL | Status: DC | PRN

## 2014-04-06 MED ORDER — ONDANSETRON 8 MG PO TBDP
8.0000 mg | ORAL_TABLET | Freq: Once | ORAL | Status: AC
  Administered 2014-04-06: 8 mg via ORAL
  Filled 2014-04-06: qty 1

## 2014-04-06 NOTE — MAU Note (Addendum)
Pt reports she has had headache and n/v since the lat time she was here. She was given several Rx but none of them are relieving the nausea or headache.pt reports she is depressed because she feels so sick. Stated i feel like I don't want to be pregnant anymore. Denies wanting to do anything to hurt herself.

## 2014-04-06 NOTE — MAU Provider Note (Signed)
History     CSN: [id number]  Arrival date and time: 04/06/14 1609   First Provider Initiated Contact with Patient 04/06/14 1800      Chief Complaint  Patient presents with  . Headache  . Emesis   HPI Comments: Lindsay Ramirez 28 y.o. [id number] [redacted]w[redacted]d presents to MAU with ongoing complaints of nausea, vomiting, headache and depression. She stopped her Zoloft a few days ago because it was not working and she is not taking Reglan because it is not working. She has not established care yet due to some issue with Medicaid. She would like to talk to someone to help her with her problems.  Headache  Associated symptoms include nausea and vomiting.  Emesis       Past Medical History  Diagnosis Date  . Sickle cell trait   . Mental disorder   . Bipolar affective disorder   . Bipolar affective disorder 2006    TOOK MEDS X 3 YR  . Depression     PP 2013  . Abnormal Pap smear     LAST PAP 06/2011  . Asthma     [facility]; [facility]  . Infection     UTI  . Anemia     CHRONIC  . Headache(784.0)     SEVERAL TIMES/DAY  . Hepatitis 2011    HEPATITIS B ????/States negative 11/23/12  . Vaginal Pap smear, abnormal     Past Surgical History  Procedure Laterality Date  . Mouth surgery    . Wisdom tooth extraction  2012  . Vaginal delivery      X 3  . Dilation and evacuation N/A 11/04/2013    Procedure: DILATATION AND EVACUATION;  Surgeon: Alwyn Pea, MD;  Location: [facility];  Service: Gynecology;  Laterality: N/A;    Family History  Problem Relation Age of Onset  . Hypertension Mother   . Asthma Mother   . Heart disease Mother   . Hyperlipidemia Mother   . Thyroid disease Mother   . Alcohol abuse Father   . Asthma Sister   . Depression Sister   . Diabetes Maternal Aunt   . HIV Maternal Aunt   . Cancer Maternal Grandmother 37    COLON  . Depression Maternal Grandmother   . Alcohol abuse Maternal Grandmother   . Drug abuse Maternal Grandmother   . Alcohol abuse  Maternal Grandfather   . Drug abuse Maternal Grandfather   . Stroke Maternal Grandfather   . Alcohol abuse Paternal Grandfather   . Drug abuse Paternal Grandfather   . Asthma Sister     History  Substance Use Topics  . Smoking status: Never Smoker   . Smokeless tobacco: Never Used  . Alcohol Use: No    Allergies: No Known Allergies  Prescriptions prior to admission  Medication Sig Dispense Refill  . acetaminophen (TYLENOL) 500 MG tablet Take 1,000 mg by mouth every 6 (six) hours as needed for headache.       . butalbital-acetaminophen-caffeine (FIORICET) 50-325-40 MG per tablet Take 1-2 tablets by mouth every 6 (six) hours as needed for headache.  20 tablet  0  . cyclobenzaprine (FLEXERIL) 10 MG tablet Take 1 tablet (10 mg total) by mouth 3 (three) times daily as needed for muscle spasms.  30 tablet  2  . metoCLOPramide (REGLAN) 10 MG tablet Take 1 tablet (10 mg total) by mouth every 6 (six) hours.  30 tablet  0  . naproxen sodium (ANAPROX) 220 MG tablet Take 440 mg  by mouth 2 (two) times daily as needed (for headache.).       Marland Kitchen oxyCODONE-acetaminophen (PERCOCET/ROXICET) 5-325 MG per tablet Take 2 tablets by mouth every 4 (four) hours as needed for severe pain.  15 tablet  0  . sertraline (ZOLOFT) 50 MG tablet Take 1 tablet (50 mg total) by mouth daily.  30 tablet  0    Review of Systems  Constitutional: Negative.   Eyes: Negative.   Respiratory: Negative.   Cardiovascular: Negative.   Gastrointestinal: Positive for nausea and vomiting.  Skin: Negative.   Neurological: Negative.   Psychiatric/Behavioral: Positive for depression.   Physical Exam   Blood pressure 126/84, pulse 76, temperature 99.5 F (37.5 C), resp. rate 18, height 5\' 2"  (1.575 m), weight 89.631 kg (197 lb 9.6 oz), not currently breastfeeding.  Physical Exam  Constitutional: She is oriented to person, place, and time. She appears well-developed and well-nourished. No distress.  HENT:  Head: Normocephalic  and atraumatic.  Eyes: Pupils are equal, round, and reactive to light.  Neck: Normal range of motion. Neck supple.  Cardiovascular: Normal rate, regular rhythm and normal heart sounds.   Respiratory: Effort normal and breath sounds normal.  GI: Soft. Bowel sounds are normal.  Genitourinary:  Not examined  Musculoskeletal: Normal range of motion.  Neurological: She is oriented to person, place, and time.  Skin: Skin is warm and dry.  Psychiatric: She exhibits a depressed mood.    MAU Course  Procedures  MDM  Called social Services  Assessment and Plan   A: Depression Migraines Nausea  P: Lengthy discussion concerning taking medications daily Will increase Zoloft to 100 mg daily Add Phenergan 25 mg to Reglan and alternate Advise on small freqent meals Advised to contact medicaid and have her physician changed to [facility]   Georgia Duff 04/06/2014, 6:25 PM

## 2014-04-06 NOTE — Discharge Instructions (Signed)
Depression Depression refers to feeling sad, low, down in the dumps, blue, gloomy, or empty. In general, there are two kinds of depression: 1. Normal sadness or normal grief. This kind of depression is one that we all feel from time to time after upsetting life experiences, such as the loss of a job or the ending of a relationship. This kind of depression is considered normal, is short lived, and resolves within a few days to 2 weeks. Depression experienced after the loss of a loved one (bereavement) often lasts longer than 2 weeks but normally gets better with time. 2. Clinical depression. This kind of depression lasts longer than normal sadness or normal grief or interferes with your ability to function at home, at work, and in school. It also interferes with your personal relationships. It affects almost every aspect of your life. Clinical depression is an illness. Symptoms of depression can also be caused by conditions other than those mentioned above, such as:  Physical illness. Some physical illnesses, including underactive thyroid gland (hypothyroidism), severe anemia, specific types of cancer, diabetes, uncontrolled seizures, heart and lung problems, strokes, and chronic pain are commonly associated with symptoms of depression.  Side effects of some prescription medicine. In some people, certain types of medicine can cause symptoms of depression.  Substance abuse. Abuse of alcohol and illicit drugs can cause symptoms of depression. SYMPTOMS Symptoms of normal sadness and normal grief include the following:  Feeling sad or crying for short periods of time.  Not caring about anything (apathy).  Difficulty sleeping or sleeping too much.  No longer able to enjoy the things you used to enjoy.  Desire to be by oneself all the time (social isolation).  Lack of energy or motivation.  Difficulty concentrating or remembering.  Change in appetite or weight.  Restlessness or  agitation. Symptoms of clinical depression include the same symptoms of normal sadness or normal grief and also the following symptoms:  Feeling sad or crying all the time.  Feelings of guilt or worthlessness.  Feelings of hopelessness or helplessness.  Thoughts of suicide or the desire to harm yourself (suicidal ideation).  Loss of touch with reality (psychotic symptoms). Seeing or hearing things that are not real (hallucinations) or having false beliefs about your life or the people around you (delusions and paranoia). DIAGNOSIS  The diagnosis of clinical depression is usually based on how bad the symptoms are and how long they have lasted. Your health care provider will also ask you questions about your medical history and substance use to find out if physical illness, use of prescription medicine, or substance abuse is causing your depression. Your health care provider may also order blood tests. TREATMENT  Often, normal sadness and normal grief do not require treatment. However, sometimes antidepressant medicine is given for bereavement to ease the depressive symptoms until they resolve. The treatment for clinical depression depends on how bad the symptoms are but often includes antidepressant medicine, counseling with a mental health professional, or both. Your health care provider will help to determine what treatment is best for you. Depression caused by physical illness usually goes away with appropriate medical treatment of the illness. If prescription medicine is causing depression, talk with your health care provider about stopping the medicine, decreasing the dose, or changing to another medicine. Depression caused by the abuse of alcohol or illicit drugs goes away when you stop using these substances. Some adults need professional help in order to stop drinking or using drugs. [place]  CARE IF:  You have thoughts about hurting yourself or others.  You lose touch  with reality (have psychotic symptoms).  You are taking medicine for depression and have a serious side effect. FOR MORE INFORMATION  National Alliance on Mental Illness: www.nami.[employer]: https://carter.com/ Document Released: 08/22/2000 Document Revised: 01/09/2014 Document Reviewed: 11/24/2011 [facility] Patient Information 2015 [place], [place]. This information is not intended to replace advice given to you by your health care provider. Make sure you discuss any questions you have with your health care provider.  Headaches, Frequently Asked Questions MIGRAINE HEADACHES Q: What is migraine? What causes it? How can I treat it? A: Generally, migraine headaches begin as a dull ache. Then they develop into a constant, throbbing, and pulsating pain. You may experience pain at the temples. You may experience pain at the front or back of one or both sides of the head. The pain is usually accompanied by a combination of:  Nausea.  Vomiting.  Sensitivity to light and noise. Some people (about 15%) experience an aura (see below) before an attack. The cause of migraine is believed to be chemical reactions in the brain. Treatment for migraine may include over-the-counter or prescription medications. It may also include self-help techniques. These include relaxation training and biofeedback.  Q: What is an aura? A: About 15% of people with migraine get an "aura". This is a sign of neurological symptoms that occur before a migraine headache. You may see wavy or jagged lines, dots, or flashing lights. You might experience tunnel vision or blind spots in one or both eyes. The aura can include visual or auditory hallucinations (something imagined). It may include disruptions in smell (such as strange odors), taste or touch. Other symptoms include:  Numbness.  A "pins and needles" sensation.  Difficulty in recalling or speaking the correct word. These neurological events  may last as long as 60 minutes. These symptoms will fade as the headache begins. Q: What is a trigger? A: Certain physical or environmental factors can lead to or "trigger" a migraine. These include:  Foods.  Hormonal changes.  Weather.  Stress. It is important to remember that triggers are different for everyone. To help prevent migraine attacks, you need to figure out which triggers affect you. Keep a headache diary. This is a good way to track triggers. The diary will help you talk to your healthcare professional about your condition. Q: Does weather affect migraines? A: Bright sunshine, hot, humid conditions, and drastic changes in barometric pressure may lead to, or "trigger," a migraine attack in some people. But studies have shown that weather does not act as a trigger for everyone with migraines. Q: What is the link between migraine and hormones? A: Hormones start and regulate many of your body's functions. Hormones keep your body in balance within a constantly changing environment. The levels of hormones in your body are unbalanced at times. Examples are during menstruation, pregnancy, or menopause. That can lead to a migraine attack. In fact, about three quarters of all women with migraine report that their attacks are related to the menstrual cycle.  Q: Is there an increased risk of stroke for migraine sufferers? A: The likelihood of a migraine attack causing a stroke is very remote. That is not to say that migraine sufferers cannot have a stroke associated with their migraines. In persons under age 20, the most common associated factor for stroke is migraine headache. But over the course of a person's normal life  span, the occurrence of migraine headache may actually be associated with a reduced risk of dying from cerebrovascular disease due to stroke.  Q: What are acute medications for migraine? A: Acute medications are used to treat the pain of the headache after it has started.  Examples over-the-counter medications, NSAIDs, ergots, and triptans.  Q: What are the triptans? A: Triptans are the newest class of abortive medications. They are specifically targeted to treat migraine. Triptans are vasoconstrictors. They moderate some chemical reactions in the brain. The triptans work on receptors in your brain. Triptans help to restore the balance of a neurotransmitter called serotonin. Fluctuations in levels of serotonin are thought to be a main cause of migraine.  Q: Are over-the-counter medications for migraine effective? A: Over-the-counter, or "OTC," medications may be effective in relieving mild to moderate pain and associated symptoms of migraine. But you should see your caregiver before beginning any treatment regimen for migraine.  Q: What are preventive medications for migraine? A: Preventive medications for migraine are sometimes referred to as "prophylactic" treatments. They are used to reduce the frequency, severity, and length of migraine attacks. Examples of preventive medications include antiepileptic medications, antidepressants, beta-blockers, calcium channel blockers, and NSAIDs (nonsteroidal anti-inflammatory drugs). Q: Why are anticonvulsants used to treat migraine? A: During the past few years, there has been an increased interest in antiepileptic drugs for the prevention of migraine. They are sometimes referred to as "anticonvulsants". Both epilepsy and migraine may be caused by similar reactions in the brain.  Q: Why are antidepressants used to treat migraine? A: Antidepressants are typically used to treat people with depression. They may reduce migraine frequency by regulating chemical levels, such as serotonin, in the brain.  Q: What alternative therapies are used to treat migraine? A: The term "alternative therapies" is often used to describe treatments considered outside the scope of conventional Western medicine. Examples of alternative therapy include  acupuncture, acupressure, and yoga. Another common alternative treatment is herbal therapy. Some herbs are believed to relieve headache pain. Always discuss alternative therapies with your caregiver before proceeding. Some herbal products contain arsenic and other toxins. TENSION HEADACHES Q: What is a tension-type headache? What causes it? How can I treat it? A: Tension-type headaches occur randomly. They are often the result of temporary stress, anxiety, fatigue, or anger. Symptoms include soreness in your temples, a tightening band-like sensation around your head (a "vice-like" ache). Symptoms can also include a pulling feeling, pressure sensations, and contracting head and neck muscles. The headache begins in your forehead, temples, or the back of your head and neck. Treatment for tension-type headache may include over-the-counter or prescription medications. Treatment may also include self-help techniques such as relaxation training and biofeedback. CLUSTER HEADACHES Q: What is a cluster headache? What causes it? How can I treat it? A: Cluster headache gets its name because the attacks come in groups. The pain arrives with little, if any, warning. It is usually on one side of the head. A tearing or bloodshot eye and a runny nose on the same side of the headache may also accompany the pain. Cluster headaches are believed to be caused by chemical reactions in the brain. They have been described as the most severe and intense of any headache type. Treatment for cluster headache includes prescription medication and oxygen. SINUS HEADACHES Q: What is a sinus headache? What causes it? How can I treat it? A: When a cavity in the bones of the face and skull (a sinus) becomes inflamed, the inflammation  will cause localized pain. This condition is usually the result of an allergic reaction, a tumor, or an infection. If your headache is caused by a sinus blockage, such as an infection, you will probably have a  fever. An x-ray will confirm a sinus blockage. Your caregiver's treatment might include antibiotics for the infection, as well as antihistamines or decongestants.  REBOUND HEADACHES Q: What is a rebound headache? What causes it? How can I treat it? A: A pattern of taking acute headache medications too often can lead to a condition known as "rebound headache." A pattern of taking too much headache medication includes taking it more than 2 days per week or in excessive amounts. That means more than the label or a caregiver advises. With rebound headaches, your medications not only stop relieving pain, they actually begin to cause headaches. Doctors treat rebound headache by tapering the medication that is being overused. Sometimes your caregiver will gradually substitute a different type of treatment or medication. Stopping may be a challenge. Regularly overusing a medication increases the potential for serious side effects. Consult a caregiver if you regularly use headache medications more than 2 days per week or more than the label advises. ADDITIONAL QUESTIONS AND ANSWERS Q: What is biofeedback? A: Biofeedback is a self-help treatment. Biofeedback uses special equipment to monitor your body's involuntary physical responses. Biofeedback monitors:  Breathing.  Pulse.  Heart rate.  Temperature.  Muscle tension.  Brain activity. Biofeedback helps you refine and perfect your relaxation exercises. You learn to control the physical responses that are related to stress. Once the technique has been mastered, you do not need the equipment any more. Q: Are headaches hereditary? A: Four out of five (80%) of people that suffer report a family history of migraine. Scientists are not sure if this is genetic or a family predisposition. Despite the uncertainty, a child has a 50% chance of having migraine if one parent suffers. The child has a 75% chance if both parents suffer.  Q: Can children get  headaches? A: By the time they reach high school, most young people have experienced some type of headache. Many safe and effective approaches or medications can prevent a headache from occurring or stop it after it has begun.  Q: What type of doctor should I see to diagnose and treat my headache? A: Start with your primary caregiver. Discuss his or her experience and approach to headaches. Discuss methods of classification, diagnosis, and treatment. Your caregiver may decide to recommend you to a headache specialist, depending upon your symptoms or other physical conditions. Having diabetes, allergies, etc., may require a more comprehensive and inclusive approach to your headache. The National Headache Foundation will provide, upon request, a list of [facility] physician members in your state. Document Released: 11/15/2003 Document Revised: 11/17/2011 Document Reviewed: 04/24/2008 [facility] Patient Information 2015 [place], [place]. This information is not intended to replace advice given to you by your health care provider. Make sure you discuss any questions you have with your health care provider. Nausea and Vomiting Nausea is a sick feeling that often comes before throwing up (vomiting). Vomiting is a reflex where stomach contents come out of your mouth. Vomiting can cause severe loss of body fluids (dehydration). Children and elderly adults can become dehydrated quickly, especially if they also have diarrhea. Nausea and vomiting are symptoms of a condition or disease. It is important to find the cause of your symptoms. CAUSES   Direct irritation of the stomach lining. This irritation can result  from increased acid production (gastroesophageal reflux disease), infection, food poisoning, taking certain medicines (such as nonsteroidal anti-inflammatory drugs), alcohol use, or tobacco use.  Signals from the brain.These signals could be caused by a headache, heat exposure, an inner ear disturbance, increased  pressure in the brain from injury, infection, a tumor, or a concussion, pain, emotional stimulus, or metabolic problems.  An obstruction in the gastrointestinal tract (bowel obstruction).  Illnesses such as diabetes, hepatitis, gallbladder problems, appendicitis, kidney problems, cancer, sepsis, atypical symptoms of a heart attack, or eating disorders.  Medical treatments such as chemotherapy and radiation.  Receiving medicine that makes you sleep (general anesthetic) during surgery. DIAGNOSIS Your caregiver may ask for tests to be done if the problems do not improve after a few days. Tests may also be done if symptoms are severe or if the reason for the nausea and vomiting is not clear. Tests may include:  Urine tests.  Blood tests.  Stool tests.  Cultures (to look for evidence of infection).  X-rays or other imaging studies. Test results can help your caregiver make decisions about treatment or the need for additional tests. TREATMENT You need to stay well hydrated. Drink frequently but in small amounts.You may wish to drink water, sports drinks, clear broth, or eat frozen ice pops or gelatin dessert to help stay hydrated.When you eat, eating slowly may help prevent nausea.There are also some antinausea medicines that may help prevent nausea. HOME CARE INSTRUCTIONS   Take all medicine as directed by your caregiver.  If you do not have an appetite, do not force yourself to eat. However, you must continue to drink fluids.  If you have an appetite, eat a normal diet unless your caregiver tells you differently.  Eat a variety of complex carbohydrates (rice, wheat, potatoes, bread), lean meats, yogurt, fruits, and vegetables.  Avoid high-fat foods because they are more difficult to digest.  Drink enough water and fluids to keep your urine clear or pale yellow.  If you are dehydrated, ask your caregiver for specific rehydration instructions. Signs of dehydration may  include:  Severe thirst.  Dry lips and mouth.  Dizziness.  Dark urine.  Decreasing urine frequency and amount.  Confusion.  Rapid breathing or pulse. SEEK IMMEDIATE MEDICAL CARE IF:   You have blood or brown flecks (like coffee grounds) in your vomit.  You have black or bloody stools.  You have a severe headache or stiff neck.  You are confused.  You have severe abdominal pain.  You have chest pain or trouble breathing.  You do not urinate at least once every 8 hours.  You develop cold or clammy skin.  You continue to vomit for longer than 24 to 48 hours.  You have a fever. MAKE SURE YOU:   Understand these instructions.  Will watch your condition.  Will get help right away if you are not doing well or get worse. Document Released: 08/25/2005 Document Revised: 11/17/2011 Document Reviewed: 01/22/2011 [facility] Patient Information 2015 [place], [place]. This information is not intended to replace advice given to you by your health care provider. Make sure you discuss any questions you have with your health care provider.

## 2014-04-06 NOTE — MAU Provider Note (Signed)
Attestation of Attending Supervision of Advanced Practitioner (CNM/NP): Evaluation and management procedures were performed by the Advanced Practitioner under my supervision and collaboration.  I have reviewed the Advanced Practitioner's note and chart, and I agree with the management and plan.  Alizzon Dioguardi 04/06/2014 8:38 PM

## 2014-04-27 ENCOUNTER — Telehealth: Payer: Self-pay

## 2014-04-27 ENCOUNTER — Encounter: Payer: Self-pay | Admitting: Obstetrics & Gynecology

## 2014-04-27 ENCOUNTER — Encounter (HOSPITAL_COMMUNITY): Payer: Self-pay | Admitting: *Deleted

## 2014-04-27 ENCOUNTER — Inpatient Hospital Stay (HOSPITAL_COMMUNITY)
Admission: AD | Admit: 2014-04-27 | Discharge: 2014-04-27 | Disposition: A | Discharge status: Home / Self Care | Payer: Medicaid Other | Source: Ambulatory Visit | Attending: Obstetrics | Admitting: Obstetrics

## 2014-04-27 ENCOUNTER — Ambulatory Visit (INDEPENDENT_AMBULATORY_CARE_PROVIDER_SITE_OTHER): Payer: Medicaid Other | Admitting: Obstetrics & Gynecology

## 2014-04-27 VITALS — BP 116/73 | HR 66 | Temp 97.3°F | Wt 194.0 lb

## 2014-04-27 DIAGNOSIS — O0990 Supervision of high risk pregnancy, unspecified, unspecified trimester: Secondary | ICD-10-CM

## 2014-04-27 DIAGNOSIS — O9989 Other specified diseases and conditions complicating pregnancy, childbirth and the puerperium: Secondary | ICD-10-CM

## 2014-04-27 DIAGNOSIS — R112 Nausea with vomiting, unspecified: Secondary | ICD-10-CM

## 2014-04-27 DIAGNOSIS — Z3480 Encounter for supervision of other normal pregnancy, unspecified trimester: Secondary | ICD-10-CM

## 2014-04-27 DIAGNOSIS — O99891 Other specified diseases and conditions complicating pregnancy: Secondary | ICD-10-CM

## 2014-04-27 DIAGNOSIS — O210 Mild hyperemesis gravidarum: Secondary | ICD-10-CM | POA: Diagnosis present

## 2014-04-27 DIAGNOSIS — Z3482 Encounter for supervision of other normal pregnancy, second trimester: Secondary | ICD-10-CM

## 2014-04-27 DIAGNOSIS — O219 Vomiting of pregnancy, unspecified: Secondary | ICD-10-CM

## 2014-04-27 LAB — POCT URINALYSIS DIPSTICK
Bilirubin, UA: NEGATIVE
Blood, UA: NEGATIVE
GLUCOSE UA: NEGATIVE
LEUKOCYTES UA: NEGATIVE
Nitrite, UA: NEGATIVE
Protein, UA: NEGATIVE
Spec Grav, UA: 1.015
UROBILINOGEN UA: NEGATIVE
pH, UA: 5

## 2014-04-27 LAB — URINE MICROSCOPIC-ADD ON

## 2014-04-27 LAB — URINALYSIS, ROUTINE W REFLEX MICROSCOPIC
Bilirubin Urine: NEGATIVE
Glucose, UA: NEGATIVE mg/dL
HGB URINE DIPSTICK: NEGATIVE
Ketones, ur: NEGATIVE mg/dL
Nitrite: NEGATIVE
Protein, ur: NEGATIVE mg/dL
SPECIFIC GRAVITY, URINE: 1.015 (ref 1.005–1.030)
UROBILINOGEN UA: 2 mg/dL — AB (ref 0.0–1.0)
pH: 6.5 (ref 5.0–8.0)

## 2014-04-27 MED ORDER — DOXYLAMINE-PYRIDOXINE 10-10 MG PO TBEC: DELAYED_RELEASE_TABLET | ORAL | Status: DC

## 2014-04-27 MED ORDER — PROMETHAZINE HCL 25 MG/ML IJ SOLN
25.0000 mg | Freq: Once | INTRAMUSCULAR | Status: AC
  Administered 2014-04-27: 25 mg via INTRAMUSCULAR
  Filled 2014-04-27: qty 1

## 2014-04-27 NOTE — MAU Note (Signed)
Been throwing up the last couple days.  Went to dr, they checked her urine, sent her over here for fluid and meds

## 2014-04-27 NOTE — MAU Note (Signed)
Pt states she threw up about 5 times and about 2 x today

## 2014-04-27 NOTE — Discharge Instructions (Signed)
Hyperemesis Gravidarum °Hyperemesis gravidarum is a severe form of nausea and vomiting that happens during pregnancy. Hyperemesis is worse than morning sickness. It may cause you to have nausea or vomiting all day for many days. It may keep you from eating and drinking enough food and liquids. Hyperemesis usually occurs during the first half (the first 20 weeks) of pregnancy. It often goes away once a woman is in her second half of pregnancy. However, sometimes hyperemesis continues through an entire pregnancy.  °CAUSES  °The cause of this condition is not completely known but is thought to be related to changes in the body's hormones when pregnant. It could be from the high level of the pregnancy hormone or an increase in estrogen in the body.  °SIGNS AND SYMPTOMS  °· Severe nausea and vomiting. °· Nausea that does not go away. °· Vomiting that does not allow you to keep any food down. °· Weight loss and body fluid loss (dehydration). °· Having no desire to eat or not liking food you have previously enjoyed. °DIAGNOSIS  °Your health care provider will do a physical exam and ask you about your symptoms. He or she may also order blood tests and urine tests to make sure something else is not causing the problem.  °TREATMENT  °You may only need medicine to control the problem. If medicines do not control the nausea and vomiting, you will be treated in the hospital to prevent dehydration, increased acid in the blood (acidosis), weight loss, and changes in the electrolytes in your body that may harm the unborn baby (fetus). You may need IV fluids.  °HOME CARE INSTRUCTIONS  °· Only take over-the-counter or prescription medicines as directed by your health care provider. °· Try eating a couple of dry crackers or toast in the morning before getting out of bed. °· Avoid foods and smells that upset your stomach. °· Avoid fatty and spicy foods. °· Eat 5-6 small meals a day. °· Do not drink when eating meals. Drink between  meals. °· For snacks, eat high-protein foods, such as cheese. °· Eat or suck on things that have ginger in them. Ginger helps nausea. °· Avoid food preparation. The smell of food can spoil your appetite. °· Avoid iron pills and iron in your multivitamins until after 3-4 months of being pregnant. However, consult with your health care provider before stopping any prescribed iron pills. °SEEK MEDICAL CARE IF:  °· Your abdominal pain increases. °· You have a severe headache. °· You have vision problems. °· You are losing weight. °SEEK IMMEDIATE MEDICAL CARE IF:  °· You are unable to keep fluids down. °· You vomit blood. °· You have constant nausea and vomiting. °· You have excessive weakness. °· You have extreme thirst. °· You have dizziness or fainting. °· You have a fever or persistent symptoms for more than 2-3 days. °· You have a fever and your symptoms suddenly get worse. °MAKE SURE YOU:  °· Understand these instructions. °· Will watch your condition. °· Will get help right away if you are not doing well or get worse. °Document Released: 08/25/2005 Document Revised: 06/15/2013 Document Reviewed: 04/06/2013 °[employer]® Patient Information ©2015 [employer]. This information is not intended to replace advice given to you by your health care provider. Make sure you discuss any questions you have with your health care provider. ° °

## 2014-04-27 NOTE — Progress Notes (Signed)
Subjective:    Lindsay Ramirez is being seen today for her first obstetrical visit.  This is not a planned pregnancy. She is at [redacted]w[redacted]d gestation. Relationship with FOB: spouse, living together. Patient does not intend to breast feed. Pregnancy history fully reviewed.  The information documented in the HPI was reviewed and verified.  Menstrual History: OB History   Grav Para Term Preterm Abortions TAB SAB Ect Mult Living   6 4 3 1 1  0 1 0 0 4       No LMP recorded. Patient is pregnant.    Past Medical History  Diagnosis Date  . Sickle cell trait   . Mental disorder   . Bipolar affective disorder   . Bipolar affective disorder 2006    TOOK MEDS X 3 YR  . Depression     PP 2013  . Abnormal Pap smear     LAST PAP 06/2011  . Asthma     [facility]; [facility]  . Infection     UTI  . Anemia     CHRONIC  . Headache(784.0)     SEVERAL TIMES/DAY  . Hepatitis 2011    HEPATITIS B ????/States negative 11/23/12  . Vaginal Pap smear, abnormal     Past Surgical History  Procedure Laterality Date  . Mouth surgery    . Wisdom tooth extraction  2012  . Vaginal delivery      X 3  . Dilation and evacuation N/A 11/04/2013    Procedure: DILATATION AND EVACUATION;  Surgeon: Alwyn Pea, MD;  Location: [facility];  Service: Gynecology;  Laterality: N/A;     (Not in a hospital admission) No Known Allergies  History  Substance Use Topics  . Smoking status: Never Smoker   . Smokeless tobacco: Never Used  . Alcohol Use: No    Family History  Problem Relation Age of Onset  . Hypertension Mother   . Asthma Mother   . Heart disease Mother   . Hyperlipidemia Mother   . Thyroid disease Mother   . Alcohol abuse Father   . Asthma Sister   . Depression Sister   . Diabetes Maternal Aunt   . HIV Maternal Aunt   . Cancer Maternal Grandmother 37    COLON  . Depression Maternal Grandmother   . Alcohol abuse Maternal Grandmother   . Drug abuse Maternal Grandmother   . Alcohol abuse  Maternal Grandfather   . Drug abuse Maternal Grandfather   . Stroke Maternal Grandfather   . Alcohol abuse Paternal Grandfather   . Drug abuse Paternal Grandfather   . Asthma Sister      Review of Systems Constitutional: negative for weight loss Gastrointestinal: negative for vomiting Genitourinary:negative for genital lesions and vaginal discharge and dysuria Musculoskeletal:negative for back pain Behavioral/Psych: negative for abusive relationship, depression, illegal drug usage and tobacco use    Objective:    BP 116/73  Pulse 66  Temp(Src) 97.3 F (36.3 C)  Wt 87.998 kg (194 lb) General Appearance:    Alert, cooperative, no distress, appears stated age  Head:    Normocephalic, without obvious abnormality, atraumatic  Eyes:    PERRL, conjunctiva/corneas clear, EOM's intact, fundi    benign, both eyes  Ears:    Normal TM's and external ear canals, both ears  Nose:   Nares normal, septum midline, mucosa normal, no drainage    or sinus tenderness  Throat:   Lips, mucosa, and tongue normal; teeth and gums normal  Neck:  Supple, symmetrical, trachea midline, no adenopathy;    thyroid:  no enlargement/tenderness/nodules; no carotid   bruit or JVD  Back:     Symmetric, no curvature, ROM normal, no CVA tenderness  Lungs:     Clear to auscultation bilaterally, respirations unlabored  Chest Wall:    No tenderness or deformity   Heart:    Regular rate and rhythm, S1 and S2 normal, no murmur, rub   or gallop  Breast Exam:    No tenderness, masses, or nipple abnormality  Abdomen:     Soft, non-tender, bowel sounds active all four quadrants,    no masses, no organomegaly  Genitalia:    Normal female without lesion, discharge or tenderness  Extremities:   Extremities normal, atraumatic, no cyanosis or edema  Pulses:   2+ and symmetric all extremities  Skin:   Skin color, texture, turgor normal, no rashes or lesions  Lymph nodes:   Cervical, supraclavicular, and axillary nodes normal   Neurologic:   CNII-XII intact, normal strength, sensation and reflexes    throughout      Lab Review Urine pregnancy test Labs reviewed no Radiologic studies reviewed no Assessment:    Pregnancy at [redacted]w[redacted]d weeks  Pregnancy-related N/V--mild-->moderate dehydratio   Plan:      Prenatal vitamins.  Counseling provided regarding continued use of seat belts, cessation of alcohol consumption, smoking or use of illicit drugs; infection precautions i.e., influenza/TDAP immunizations, toxoplasmosis,CMV, parvovirus, listeria and varicella; workplace safety, exercise during pregnancy; routine dental care, safe medications, sexual activity, hot tubs, saunas, pools, travel, caffeine use, fish and methlymercury, potential toxins, hair treatments, varicose veins Weight gain recommendations per IOM guidelines reviewed: obese/BMI >30->gain  11 - 20 lbs Problem list reviewed and updated. CF mutation testing/QUAD SCREEN/Spinal muscular atrophy discussed Role of ultrasound in pregnancy discussed; fetal survey: ordered. Amniocentesis discussed: not indicated.  Meds ordered this encounter  Medications  . Doxylamine-Pyridoxine (DICLEGIS) 10-10 MG TBEC    Sig: Use as Directed    Dispense:  100 tablet    Refill:  3   Orders Placed This Encounter  Procedures  . Culture, OB Urine  . US OB Comp + 14 Wk    Standing Status: Future     Number of Occurrences:      Standing Expiration Date: 06/28/2015    Order Specific Question:  Reason for Exam (SYMPTOM  OR DIAGNOSIS REQUIRED)    Answer:  dating    Order Specific Question:  Preferred imaging location?    Answer:  [facility]  . Obstetric panel  . HIV antibody  . Hemoglobinopathy evaluation  . Varicella zoster antibody, IgG  . Vit D  25 hydroxy (rtn osteoporosis monitoring)  . Hemoglobin A1c  . AFP, Quad Screen    Order Specific Question:  Repeat Sample    Answer:  No    Order Specific Question:  Maternal Race    Answer:  African American     Order Specific Question:  Pregnancy Donor Egg (Y/N)    Answer:  No    Order Specific Question:  Gest Age at U/S (Wk.Dy)    Answer:  n/a    Order Specific Question:  Number of Fetuses    Answer:  1    Order Specific Question:  Hx of OSB/NTD?    Answer:  No    Order Specific Question:  History of Down Syndrome?    Answer:  No    Order Specific Question:  Maternal IDDM (insulin-dependent diabetes mellitus)  Answer:  No  . Cystic fibrosis diagnostic study    Order Specific Question:  Patient Ethnicity:    Answer:  African-American  . Miscellaneous test    Spinal Muscular Atrophy  . POCT urinalysis dipstick   To [facility] for IV hydration/antiemetic Follow up in 1 weeks.

## 2014-04-27 NOTE — Telephone Encounter (Signed)
left message with patient regarding u/s at [facility] 05/02/14 at 3pm - told her she could call [facility] directly if she needed to reschedule.

## 2014-04-27 NOTE — MAU Provider Note (Signed)
History     CSN: [id number]  Arrival date and time: 04/27/14 1407   First Provider Initiated Contact with Patient 04/27/14 1853      Chief Complaint  Patient presents with  . Morning Sickness   HPI Comments: Lindsay Ramirez 28 y.o. [id number] [redacted]w[redacted]d presents to MAU with nausea and vomiting from Dr Jethro Bolus office for IV fluids. She was seen today and given a new medication which she has not picked up or taken. She has not eaten today and has lost about 10 lbs total in this pregnancy.      Past Medical History  Diagnosis Date  . Sickle cell trait   . Mental disorder   . Bipolar affective disorder   . Bipolar affective disorder 2006    TOOK MEDS X 3 YR  . Depression     PP 2013  . Abnormal Pap smear     LAST PAP 06/2011  . Asthma     [facility]; [facility]  . Infection     UTI  . Anemia     CHRONIC  . Headache(784.0)     SEVERAL TIMES/DAY  . Hepatitis 2011    HEPATITIS B ????/States negative 11/23/12  . Vaginal Pap smear, abnormal     Past Surgical History  Procedure Laterality Date  . Mouth surgery    . Wisdom tooth extraction  2012  . Vaginal delivery      X 3  . Dilation and evacuation N/A 11/04/2013    Procedure: DILATATION AND EVACUATION;  Surgeon: Alwyn Pea, MD;  Location: [facility];  Service: Gynecology;  Laterality: N/A;    Family History  Problem Relation Age of Onset  . Hypertension Mother   . Asthma Mother   . Heart disease Mother   . Hyperlipidemia Mother   . Thyroid disease Mother   . Alcohol abuse Father   . Asthma Sister   . Depression Sister   . Diabetes Maternal Aunt   . HIV Maternal Aunt   . Cancer Maternal Grandmother 37    COLON  . Depression Maternal Grandmother   . Alcohol abuse Maternal Grandmother   . Drug abuse Maternal Grandmother   . Alcohol abuse Maternal Grandfather   . Drug abuse Maternal Grandfather   . Stroke Maternal Grandfather   . Alcohol abuse Paternal Grandfather   . Drug abuse Paternal Grandfather   . Asthma  Sister     History  Substance Use Topics  . Smoking status: Never Smoker   . Smokeless tobacco: Never Used  . Alcohol Use: No    Allergies: No Known Allergies  Prescriptions prior to admission  Medication Sig Dispense Refill  . Doxylamine-Pyridoxine (DICLEGIS) 10-10 MG TBEC Use as Directed  100 tablet  3  . metoCLOPramide (REGLAN) 10 MG tablet Take 1 tablet (10 mg total) by mouth every 6 (six) hours.  30 tablet  0  . promethazine (PHENERGAN) 25 MG tablet Take 1 tablet (25 mg total) by mouth every 6 (six) hours as needed for nausea or vomiting.  30 tablet  0  . sertraline (ZOLOFT) 100 MG tablet Take 1 tablet (100 mg total) by mouth daily.  30 tablet  2    Review of Systems  Constitutional: Negative.   HENT: Negative.   Eyes: Negative.   Respiratory: Negative.   Cardiovascular: Negative.   Gastrointestinal: Positive for nausea and vomiting.  Genitourinary: Negative.   Musculoskeletal: Negative.   Skin: Negative.   Neurological: Negative.   Endo/Heme/Allergies: Negative.  Psychiatric/Behavioral: Negative.    Physical Exam   Blood pressure 112/59, pulse 57, temperature 98.8 F (37.1 C), temperature source Oral, resp. rate 18, height 5' 1.5" (1.562 m), weight 87.544 kg (193 lb), not currently breastfeeding.  Physical Exam  Constitutional: She appears well-developed and well-nourished. No distress.  HENT:  Head: Normocephalic and atraumatic.  Eyes: Conjunctivae are normal.  Cardiovascular: Normal rate, regular rhythm and normal heart sounds.   Respiratory: Effort normal and breath sounds normal.  GI: Soft. Bowel sounds are normal. She exhibits no distension. There is no tenderness. There is no rebound.  Genitourinary:  Not examined  Musculoskeletal: Normal range of motion. She exhibits no edema and no tenderness.  Neurological: She is alert.  Skin: Skin is warm and dry.  Psychiatric: She has a normal mood and affect. Her behavior is normal. Judgment and thought content  normal.   Results for orders placed during the hospital encounter of 04/27/14 (from the past 24 hour(s))  URINALYSIS, ROUTINE W REFLEX MICROSCOPIC     Status: Abnormal   Collection Time    04/27/14  3:00 PM      Result Value Ref Range   Color, Urine YELLOW  YELLOW   APPearance CLEAR  CLEAR   Specific Gravity, Urine 1.015  1.005 - 1.030   pH 6.5  5.0 - 8.0   Glucose, UA NEGATIVE  NEGATIVE mg/dL   Hgb urine dipstick NEGATIVE  NEGATIVE   Bilirubin Urine NEGATIVE  NEGATIVE   Ketones, ur NEGATIVE  NEGATIVE mg/dL   Protein, ur NEGATIVE  NEGATIVE mg/dL   Urobilinogen, UA 2.0 (*) 0.0 - 1.0 mg/dL   Nitrite NEGATIVE  NEGATIVE   Leukocytes, UA TRACE (*) NEGATIVE  URINE MICROSCOPIC-ADD ON     Status: Abnormal   Collection Time    04/27/14  3:00 PM      Result Value Ref Range   Squamous Epithelial / LPF FEW (*) RARE   WBC, UA 3-6  <3 WBC/hpf   RBC / HPF 0-2  <3 RBC/hpf   Bacteria, UA RARE  RARE   Urine-Other MUCOUS PRESENT      MAU Course  Procedures  MDM  Phenergan 25 mg IM now Urine culture Assessment and Plan   A: Nausea and vomiting in pregnancy  P: Reviewed small frequent meals Advised to pick up new medications Advised to follow up with Dr Massie Bougie, [place] Kocher 04/27/2014, 7:05 PM

## 2014-04-27 NOTE — Patient Instructions (Signed)
Morning Sickness °Morning sickness is when you feel sick to your stomach (nauseous) during pregnancy. You may feel sick to your stomach and throw up (vomit). You may feel sick in the morning, but you can feel this way any time of day. Some women feel very sick to their stomach and cannot stop throwing up (hyperemesis gravidarum). °HOME CARE °· Only take medicines as told by your doctor. °· Take multivitamins as told by your doctor. Taking multivitamins before getting pregnant can stop or lessen the harshness of morning sickness. °· Eat dry toast or unsalted crackers before getting out of bed. °· Eat 5 to 6 small meals a day. °· Eat dry and bland foods like rice and baked potatoes. °· Do not drink liquids with meals. Drink between meals. °· Do not eat greasy, fatty, or spicy foods. °· Have someone cook for you if the smell of food causes you to feel sick or throw up. °· If you feel sick to your stomach after taking prenatal vitamins, take them at night or with a snack. °· Eat protein when you need a snack (nuts, yogurt, cheese). °· Eat unsweetened gelatins for dessert. °· Wear a bracelet used for sea sickness (acupressure wristband). °· Go to a doctor that puts thin needles into certain body points (acupuncture) to improve how you feel. °· Do not smoke. °· Use a humidifier to keep the air in your house free of odors. °· Get lots of fresh air. °GET HELP IF: °· You need medicine to feel better. °· You feel dizzy or lightheaded. °· You are losing weight. °GET HELP RIGHT AWAY IF:  °· You feel very sick to your stomach and cannot stop throwing up. °· You pass out (faint). °MAKE SURE YOU: °· Understand these instructions. °· Will watch your condition. °· Will get help right away if you are not doing well or get worse. °Document Released: 10/02/2004 Document Revised: 08/30/2013 Document Reviewed: 02/09/2013 °ExitCare® Patient Information ©2015 [employer]. This information is not intended to replace advice given to you by  your health care provider. Make sure you discuss any questions you have with your health care provider. ° °

## 2014-04-28 LAB — CULTURE, OB URINE
COLONY COUNT: NO GROWTH
Colony Count: NO GROWTH
Culture: NO GROWTH
Organism ID, Bacteria: NO GROWTH

## 2014-04-28 LAB — VARICELLA ZOSTER ANTIBODY, IGG: Varicella IgG: 1217 Index — ABNORMAL HIGH (ref <135.00)

## 2014-04-28 LAB — OBSTETRIC PANEL
ANTIBODY SCREEN: NEGATIVE
Basophils Absolute: 0 10*3/uL (ref 0.0–0.1)
Basophils Relative: 0 % (ref 0–1)
EOS ABS: 0.1 10*3/uL (ref 0.0–0.7)
EOS PCT: 1 % (ref 0–5)
HEMATOCRIT: 32.5 % — AB (ref 36.0–46.0)
Hemoglobin: 10.7 g/dL — ABNORMAL LOW (ref 12.0–15.0)
Hepatitis B Surface Ag: NEGATIVE
LYMPHS PCT: 19 % (ref 12–46)
Lymphs Abs: 1.6 10*3/uL (ref 0.7–4.0)
MCH: 26.8 pg (ref 26.0–34.0)
MCHC: 32.9 g/dL (ref 30.0–36.0)
MCV: 81.3 fL (ref 78.0–100.0)
MONO ABS: 0.9 10*3/uL (ref 0.1–1.0)
Monocytes Relative: 11 % (ref 3–12)
Neutro Abs: 5.8 10*3/uL (ref 1.7–7.7)
Neutrophils Relative %: 69 % (ref 43–77)
PLATELETS: 233 10*3/uL (ref 150–400)
RBC: 4 MIL/uL (ref 3.87–5.11)
RDW: 14 % (ref 11.5–15.5)
RUBELLA: 4.44 {index} — AB (ref <0.90)
Rh Type: POSITIVE
WBC: 8.4 10*3/uL (ref 4.0–10.5)

## 2014-04-28 LAB — HEMOGLOBIN A1C
HEMOGLOBIN A1C: 5.3 % (ref <5.7)
Mean Plasma Glucose: 105 mg/dL (ref <117)

## 2014-04-28 LAB — HIV ANTIBODY (ROUTINE TESTING W REFLEX): HIV: NONREACTIVE

## 2014-04-28 LAB — VITAMIN D 25 HYDROXY (VIT D DEFICIENCY, FRACTURES): VIT D 25 HYDROXY: 10 ng/mL — AB (ref 30–89)

## 2014-04-30 DIAGNOSIS — O219 Vomiting of pregnancy, unspecified: Secondary | ICD-10-CM | POA: Insufficient documentation

## 2014-05-01 LAB — HEMOGLOBINOPATHY EVALUATION
HGB A: 55.4 % — AB (ref 96.8–97.8)
HGB S QUANTITAION: 41.1 % — AB
Hemoglobin Other: 0 %
Hgb A2 Quant: 3.5 % — ABNORMAL HIGH (ref 2.2–3.2)
Hgb F Quant: 0 % (ref 0.0–2.0)

## 2014-05-01 LAB — CYSTIC FIBROSIS DIAGNOSTIC STUDY

## 2014-05-02 ENCOUNTER — Ambulatory Visit (HOSPITAL_COMMUNITY)
Admission: RE | Admit: 2014-05-02 | Discharge: 2014-05-02 | Disposition: A | Discharge status: Home / Self Care | Payer: Medicaid Other | Source: Ambulatory Visit | Attending: Obstetrics & Gynecology | Admitting: Obstetrics & Gynecology

## 2014-05-02 ENCOUNTER — Other Ambulatory Visit: Payer: Self-pay | Admitting: Obstetrics & Gynecology

## 2014-05-02 DIAGNOSIS — Z3689 Encounter for other specified antenatal screening: Secondary | ICD-10-CM | POA: Insufficient documentation

## 2014-05-02 DIAGNOSIS — O30042 Twin pregnancy, dichorionic/diamniotic, second trimester: Secondary | ICD-10-CM

## 2014-05-02 DIAGNOSIS — O09899 Supervision of other high risk pregnancies, unspecified trimester: Secondary | ICD-10-CM | POA: Diagnosis not present

## 2014-05-02 DIAGNOSIS — O0990 Supervision of high risk pregnancy, unspecified, unspecified trimester: Secondary | ICD-10-CM

## 2014-05-02 DIAGNOSIS — O9989 Other specified diseases and conditions complicating pregnancy, childbirth and the puerperium: Secondary | ICD-10-CM

## 2014-05-02 LAB — PAP IG, CT-NG NAA, HPV HIGH-RISK
CHLAMYDIA PROBE AMP: NEGATIVE
GC Probe Amp: NEGATIVE
HPV DNA High Risk: DETECTED — AB

## 2014-05-02 LAB — AFP, QUAD SCREEN
AFP: 83 IU/mL
Curr Gest Age: 15.3 wks.days
Down Syndrome Scr Risk Est: 1:38500 {titer}
HCG, Total: 48957 m[IU]/mL
INH: 453.4 pg/mL
Interpretation-AFP: NEGATIVE
MOM FOR AFP: 3.2
MOM FOR HCG: 2.05
MoM for INH: 2.67
Open Spina bifida: POSITIVE — AB
Osb Risk: 1:124 {titer}
Tri 18 Scr Risk Est: NEGATIVE
Trisomy 18 (Edward) Syndrome Interp.: 1:161000 {titer}
uE3 Mom: 2.97
uE3 Value: 1 ng/mL

## 2014-05-04 ENCOUNTER — Encounter: Payer: Self-pay | Admitting: Obstetrics & Gynecology

## 2014-05-04 ENCOUNTER — Ambulatory Visit (INDEPENDENT_AMBULATORY_CARE_PROVIDER_SITE_OTHER): Payer: Medicaid Other | Admitting: Obstetrics & Gynecology

## 2014-05-04 VITALS — BP 123/68 | HR 71 | Temp 97.1°F | Wt 198.0 lb

## 2014-05-04 DIAGNOSIS — O30042 Twin pregnancy, dichorionic/diamniotic, second trimester: Secondary | ICD-10-CM

## 2014-05-04 DIAGNOSIS — Z3482 Encounter for supervision of other normal pregnancy, second trimester: Secondary | ICD-10-CM

## 2014-05-04 DIAGNOSIS — O30009 Twin pregnancy, unspecified number of placenta and unspecified number of amniotic sacs, unspecified trimester: Secondary | ICD-10-CM

## 2014-05-04 DIAGNOSIS — O30049 Twin pregnancy, dichorionic/diamniotic, unspecified trimester: Secondary | ICD-10-CM

## 2014-05-04 DIAGNOSIS — Z3480 Encounter for supervision of other normal pregnancy, unspecified trimester: Secondary | ICD-10-CM

## 2014-05-04 LAB — POCT URINALYSIS DIPSTICK
Bilirubin, UA: NEGATIVE
Blood, UA: NEGATIVE
GLUCOSE UA: NEGATIVE
Ketones, UA: NEGATIVE
LEUKOCYTES UA: NEGATIVE
Nitrite, UA: NEGATIVE
PROTEIN UA: NEGATIVE
Spec Grav, UA: <=1.005
UROBILINOGEN UA: NEGATIVE
pH, UA: 6

## 2014-05-08 NOTE — Progress Notes (Signed)
  Subjective:    Lindsay Ramirez is a 28 y.o. female being seen today for her obstetrical visit. She is at [redacted]w[redacted]d gestation. Patient reports: less N/V  Problem List Items Addressed This Visit     High   Twin gestation, dichorionic diamniotic   Relevant Orders      AMB Referral to Maternal Fetal Medicine (MFM)    Other Visit Diagnoses   Encounter for supervision of other normal pregnancy in second trimester    -  Primary    Relevant Orders       POCT urinalysis dipstick (Completed)       US OB Comp + 14 Wk       AMB Referral to Maternal Fetal Medicine (MFM)      Patient Active Problem List   Diagnosis Date Noted  . Twin gestation, dichorionic diamniotic 05/04/2014    Priority: High  . Unspecified high-risk pregnancy 04/27/2014    Priority: High  . Bipolar disorder, unspecified/depression 11/11/2012    Priority: High  . 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 Nausea/vomiting in pregnancy 04/30/2014  . Migraine without aura 03/25/2014  . SAB (spontaneous abortion), with D&E 11/04/13 11/13/2013  . Endometritis s/p D&E for SAB 11/13/2013  . 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 Gout 06/15/2013  . Vitamin D deficiency 07/26/2012  . Acid reflux 06/21/2012  . BV (bacterial vaginosis) 06/21/2012    Objective:     BP 123/68  Pulse 71  Temp(Src) 97.1 F (36.2 C)  Wt 89.812 kg (198 lb) Uterine Size: Below umbilicus     Assessment:    Twin gestation @ [redacted]w[redacted]d weeks Pregnancy related nausea/vomiting controlled on current medications  Plan:    Problem list reviewed and updated. Labs reviewed. Orders Placed This Encounter  Procedures  . US OB Comp + 14 Wk    Standing Status: Future     Number of Occurrences:      Standing Expiration Date: 07/05/2015    Order Specific Question:  Reason for Exam (SYMPTOM  OR DIAGNOSIS REQUIRED)    Answer:  Anatomy    Order Specific Question:  Preferred imaging location?    Answer:  [facility]  . AMB Referral to  Maternal Fetal Medicine (MFM)    Referral Priority:  Urgent    Referral Type:  Consultation    Referral Reason:  Specialty Services Required    Requested Specialty:  Maternal and Fetal Medicine    Number of Visits Requested:  1  . POCT urinalysis dipstick   Follow up in 4 weeks.

## 2014-05-08 NOTE — Patient Instructions (Signed)
Multiple Pregnancy A multiple pregnancy is when a woman is pregnant with two or more fetuses. Multiple pregnancies occur in about 3% of all births. The more babies in a pregnancy, the greater the risks of problems to the babies and mother. This includes death. Since the use of Assisted Reproductive Technology (ART) and medications that can induce ovulation, multiple fetal gestation has increased.  RISKS TO THE MOTHER  Preeclampsia and eclampsia.  Postpartum bleeding (hemorrhage).  Kidney infection (pyelonephritis).  Develop anemia.  Develop diabetes.  Liver complications.  A blood clot blocks the artery, or branch of the artery leading to the lungs (pulmonary embolism).  Blood clots in the leg.  Placental separation.  Higher rate of Cesarean Section deliveries.  Women over 35 years old have a higher rate of Downs Syndrome babies. RISKS TO THE BABY  Preterm labor with a premature baby.  Very low birth weight babies that are less than 3 pounds, especially with triplets or mores.  Premature rupture of the membranes.  Twin to twin blood transfusion with one baby anemic and the other baby with too much blood in its system. There may also be heart failure.  With triplets or more, one of the babies is at high risk for cerebral palsy or other neurologic problem.  There is a higher incidence of fetal death. CARE OF MOTHERS WITH MULTIPLE FETAL GESTATION Multiple pregnancies need more care and special prenatal care.  You will see your caregiver more often.  You will have more tests including ultrasounds, nonstress tests and blood tests.  You will have special tests done called amniocentesis and a biophysical profile.  You may be hospitalized more often during the pregnancy.  You will be encouraged to eat a balanced and healthy diet with vitamin and mineral supplements as directed.  You will be asked to get more rest and sleep to keep up your energy.  You will be asked to  restrict your daily activities, exercise, work, household chores and sexual activity.  If you have preterm labor with small babies, you will be given a steroid injection to help the babies lungs mature and do better when born.  The delivery may have to be by Cesarean delivery, especially if there are triplets or more.  The delivery should be in a hospital with an intensive care nursery and Neonatologists (pediatrician for high risk babies) to care for the newborn babies. HOME CARE INSTRUCTIONS   Follow the caregiver's recommendations regarding office visits, tests for you and the babies, diet, rest and medications.  Avoid a large amount of physical activity.  Arrange to have help after the babies are born and when you go home from the hospital.  Take classes on how to care for multiple babies before you deliver them. SEEK IMMEDIATE MEDICAL CARE IF:   You develop a temperature of 102 F (38.9 C) or higher.  You are leaking fluid from the vagina.  You develop vaginal bleeding.  You develop uterine contractions.  You develop a severe headache, severe upper abdominal pain, visual problems or excessive swelling of your face, hands and feet.  You develop severe back pain or leg pain.  You develop severe tiredness.  You develop chest pain.  You have shortness of breath, fall down or pass out. Document Released: 06/03/2008 Document Revised: 11/17/2011 Document Reviewed: 07/29/2013 ExitCare Patient Information 2015 [employer]. This information is not intended to replace advice given to you by your health care provider. Make sure you discuss any questions you have with   your health care provider.

## 2014-05-09 ENCOUNTER — Telehealth: Payer: Self-pay

## 2014-05-09 ENCOUNTER — Inpatient Hospital Stay (HOSPITAL_COMMUNITY)
Admission: AD | Admit: 2014-05-09 | Discharge: 2014-05-10 | DRG: 781 | Disposition: A | Discharge status: Home / Self Care | Payer: Medicaid Other | Source: Ambulatory Visit | Attending: Obstetrics | Admitting: Obstetrics

## 2014-05-09 ENCOUNTER — Ambulatory Visit (INDEPENDENT_AMBULATORY_CARE_PROVIDER_SITE_OTHER): Payer: Medicaid Other | Admitting: Obstetrics

## 2014-05-09 VITALS — BP 110/70 | Temp 99.2°F | Wt 197.0 lb

## 2014-05-09 DIAGNOSIS — Z823 Family history of stroke: Secondary | ICD-10-CM | POA: Diagnosis not present

## 2014-05-09 DIAGNOSIS — O210 Mild hyperemesis gravidarum: Secondary | ICD-10-CM | POA: Diagnosis present

## 2014-05-09 DIAGNOSIS — O30042 Twin pregnancy, dichorionic/diamniotic, second trimester: Secondary | ICD-10-CM

## 2014-05-09 DIAGNOSIS — O30009 Twin pregnancy, unspecified number of placenta and unspecified number of amniotic sacs, unspecified trimester: Secondary | ICD-10-CM

## 2014-05-09 DIAGNOSIS — Z8249 Family history of ischemic heart disease and other diseases of the circulatory system: Secondary | ICD-10-CM

## 2014-05-09 DIAGNOSIS — Z833 Family history of diabetes mellitus: Secondary | ICD-10-CM

## 2014-05-09 DIAGNOSIS — O30049 Twin pregnancy, dichorionic/diamniotic, unspecified trimester: Secondary | ICD-10-CM

## 2014-05-09 DIAGNOSIS — Z825 Family history of asthma and other chronic lower respiratory diseases: Secondary | ICD-10-CM

## 2014-05-09 DIAGNOSIS — O0990 Supervision of high risk pregnancy, unspecified, unspecified trimester: Secondary | ICD-10-CM

## 2014-05-09 LAB — COMPREHENSIVE METABOLIC PANEL
ALBUMIN: 2.8 g/dL — AB (ref 3.5–5.2)
ALT: 6 U/L (ref 0–35)
AST: 10 U/L (ref 0–37)
Alkaline Phosphatase: 60 U/L (ref 39–117)
Anion gap: 12 (ref 5–15)
BUN: 5 mg/dL — ABNORMAL LOW (ref 6–23)
CHLORIDE: 104 meq/L (ref 96–112)
CO2: 22 meq/L (ref 19–32)
Calcium: 9 mg/dL (ref 8.4–10.5)
Creatinine, Ser: 0.6 mg/dL (ref 0.50–1.10)
GFR calc Af Amer: >90 mL/min (ref >90)
GFR calc non Af Amer: >90 mL/min (ref >90)
Glucose, Bld: 95 mg/dL (ref 70–99)
Potassium: 3.7 mEq/L (ref 3.7–5.3)
Sodium: 138 mEq/L (ref 137–147)
Total Protein: 6 g/dL (ref 6.0–8.3)

## 2014-05-09 LAB — POCT URINALYSIS DIPSTICK
BILIRUBIN UA: NEGATIVE
Glucose, UA: NEGATIVE
Ketones, UA: NEGATIVE
Leukocytes, UA: NEGATIVE
Nitrite, UA: NEGATIVE
Protein, UA: NEGATIVE
RBC UA: NEGATIVE
Spec Grav, UA: 1.01
Urobilinogen, UA: NEGATIVE
pH, UA: 6.5

## 2014-05-09 LAB — CBC
HCT: 30.2 % — ABNORMAL LOW (ref 36.0–46.0)
Hemoglobin: 10.1 g/dL — ABNORMAL LOW (ref 12.0–15.0)
MCH: 26.6 pg (ref 26.0–34.0)
MCHC: 33.4 g/dL (ref 30.0–36.0)
MCV: 79.7 fL (ref 78.0–100.0)
PLATELETS: 221 10*3/uL (ref 150–400)
RBC: 3.79 MIL/uL — AB (ref 3.87–5.11)
RDW: 13.3 % (ref 11.5–15.5)
WBC: 9.4 10*3/uL (ref 4.0–10.5)

## 2014-05-09 LAB — TSH: TSH: 1.39 u[IU]/mL (ref 0.350–4.500)

## 2014-05-09 MED ORDER — METOCLOPRAMIDE HCL 5 MG/ML IJ SOLN
5.0000 mg | Freq: Four times a day (QID) | INTRAMUSCULAR | Status: DC
  Administered 2014-05-09 – 2014-05-10 (×4): 5 mg via INTRAVENOUS
  Filled 2014-05-09 (×4): qty 2

## 2014-05-09 MED ORDER — DOCUSATE SODIUM 100 MG PO CAPS
100.0000 mg | ORAL_CAPSULE | Freq: Every day | ORAL | Status: DC
  Administered 2014-05-10: 100 mg via ORAL
  Filled 2014-05-09: qty 1

## 2014-05-09 MED ORDER — DEXTROSE-NACL 5-0.45 % IV SOLN
INTRAVENOUS | Status: DC
  Administered 2014-05-09 – 2014-05-10 (×3): via INTRAVENOUS

## 2014-05-09 MED ORDER — CALCIUM CARBONATE ANTACID 500 MG PO CHEW: 2.0000 | CHEWABLE_TABLET | ORAL | Status: DC | PRN

## 2014-05-09 MED ORDER — ACETAMINOPHEN 325 MG PO TABS
650.0000 mg | ORAL_TABLET | ORAL | Status: DC | PRN
  Administered 2014-05-09 – 2014-05-10 (×2): 650 mg via ORAL
  Filled 2014-05-09 (×2): qty 2

## 2014-05-09 MED ORDER — ZOLPIDEM TARTRATE 5 MG PO TABS: 5.0000 mg | ORAL_TABLET | Freq: Every evening | ORAL | Status: DC | PRN

## 2014-05-09 MED ORDER — PRENATAL MULTIVITAMIN CH
1.0000 | ORAL_TABLET | Freq: Every day | ORAL | Status: DC
  Administered 2014-05-10: 1 via ORAL
  Filled 2014-05-09: qty 1

## 2014-05-09 NOTE — Telephone Encounter (Signed)
left message regarding patient's appts - MFMs, U/S, return OB

## 2014-05-10 ENCOUNTER — Encounter: Payer: Self-pay | Admitting: Obstetrics

## 2014-05-10 ENCOUNTER — Encounter (HOSPITAL_COMMUNITY): Payer: Self-pay

## 2014-05-10 LAB — URINALYSIS, ROUTINE W REFLEX MICROSCOPIC
BILIRUBIN URINE: NEGATIVE
GLUCOSE, UA: NEGATIVE mg/dL
Hgb urine dipstick: NEGATIVE
KETONES UR: NEGATIVE mg/dL
LEUKOCYTES UA: NEGATIVE
Nitrite: NEGATIVE
Protein, ur: NEGATIVE mg/dL
SPECIFIC GRAVITY, URINE: 1.01 (ref 1.005–1.030)
Urobilinogen, UA: 1 mg/dL (ref 0.0–1.0)
pH: 6 (ref 5.0–8.0)

## 2014-05-10 MED ORDER — TRAMADOL HCL 50 MG PO TABS
100.0000 mg | ORAL_TABLET | Freq: Four times a day (QID) | ORAL | Status: DC | PRN
  Administered 2014-05-10: 100 mg via ORAL
  Filled 2014-05-10: qty 2

## 2014-05-10 MED ORDER — BOOST / RESOURCE BREEZE PO LIQD
237.0000 mL | Freq: Three times a day (TID) | ORAL | Status: DC
  Administered 2014-05-10: 1 via ORAL
  Filled 2014-05-10 (×3): qty 1

## 2014-05-10 NOTE — Progress Notes (Signed)
Patient ID: ROENA SASSAMAN, female   DOB: 1986-05-02, 28 y.o.   MRN: [id number] Hospital Day: 2  S: No vomiting overnight.  Tolerating BRAT Diet.  O: Blood pressure 96/46, pulse 67, temperature 98.4 F (36.9 C), temperature source Oral, resp. rate 16, height 5\' 2"  (1.575 m), weight 198 lb 4 oz (89.926 kg), SpO2 99.00%, not currently breastfeeding.   NUU:[id number]: 150 bpm Toco: None SVE:  Deferred  A/P- 28 y.o. admitted with:  N/V and inability to keep anything down.  Twin gestation.  Stable.  Nutrition consultation and recommendations made.  Slowly advance diet as tolerated.  Present on Admission:  . Hyperemesis affecting pregnancy, antepartum  Pregnancy Complications: Hyperemesis.  Twins.  Preterm labor management: no treatment necessary Dating:  [redacted]w[redacted]d PNL Needed:  none FWB:  good PTL:  none

## 2014-05-10 NOTE — H&P (Signed)
Lindsay Ramirez is a 28 y.o. female presenting for nausea / vomiting, and cannot keep anything down.  Taking Diclegis.. Maternal Medical History:  Reason for admission: Nausea.   Prenatal complications: no prenatal complications Prenatal Complications - Diabetes: none.    OB History   Grav Para Term Preterm Abortions TAB SAB Ect Mult Living   6 4 3 1 1  0 1 0 0 4     Past Medical History  Diagnosis Date  . Sickle cell trait   . Mental disorder   . Bipolar affective disorder   . Bipolar affective disorder 2006    TOOK MEDS X 3 YR  . Depression     PP 2013  . Abnormal Pap smear     LAST PAP 06/2011  . Asthma     [facility]; [facility]  . Infection     UTI  . Anemia     CHRONIC  . Headache(784.0)     SEVERAL TIMES/DAY  . Hepatitis 2011    HEPATITIS B ????/States negative 11/23/12  . Vaginal Pap smear, abnormal    Past Surgical History  Procedure Laterality Date  . Mouth surgery    . Wisdom tooth extraction  2012  . Vaginal delivery      X 3  . Dilation and evacuation N/A 11/04/2013    Procedure: DILATATION AND EVACUATION;  Surgeon: Alwyn Pea, MD;  Location: [facility];  Service: Gynecology;  Laterality: N/A;   Family History: family history includes Alcohol abuse in her father, maternal grandfather, maternal grandmother, and paternal grandfather; Asthma in her mother, sister, and sister; Cancer (age of onset: 51) in her maternal grandmother; Depression in her maternal grandmother and sister; Diabetes in her maternal aunt; Drug abuse in her maternal grandfather, maternal grandmother, and paternal grandfather; HIV in her maternal aunt; Heart disease in her mother; Hyperlipidemia in her mother; Hypertension in her mother; Stroke in her maternal grandfather; Thyroid disease in her mother. Social History:  reports that she has never smoked. She has never used smokeless tobacco. She reports that she does not drink alcohol or use illicit drugs.   Prenatal Transfer Tool   Maternal Diabetes: No Genetic Screening: Normal Maternal Ultrasounds/Referrals: Normal Fetal Ultrasounds or other Referrals:  None Maternal Substance Abuse:  No Significant Maternal Medications:  None Significant Maternal Lab Results:  None Other Comments:  None  Review of Systems  Gastrointestinal: Positive for nausea and vomiting.  All other systems reviewed and are negative.     Blood pressure 99/49, pulse 69, temperature 98.8 F (37.1 C), temperature source Oral, resp. rate 16, height 5\' 2"  (1.575 m), weight 195 lb 12 oz (88.792 kg), SpO2 100.00%, not currently breastfeeding. Maternal Exam:  Abdomen: Patient reports no abdominal tenderness.   Physical Exam  Nursing note and vitals reviewed. Constitutional: She is oriented to person, place, and time. She appears well-developed and well-nourished.  HENT:  Head: Normocephalic and atraumatic.  Eyes: Conjunctivae are normal. Pupils are equal, round, and reactive to light.  Neck: Normal range of motion. Neck supple.  Cardiovascular: Normal rate and regular rhythm.   Respiratory: Effort normal and breath sounds normal.  GI: Soft.  Neurological: She is alert and oriented to person, place, and time.  Skin: Skin is warm and dry.  Psychiatric: She has a normal mood and affect. Her behavior is normal. Judgment and thought content normal.    Prenatal labs: ABO, Rh: O/POS/-- (08/20 1426) Antibody: NEG (08/20 1426) Rubella: 4.44 (08/20 1426) RPR: NON REAC (08/20  1426)  HBsAg: NEGATIVE (08/20 1426)  HIV: NONREACTIVE (08/20 1426)  GBS:     Assessment/Plan: 17 weeks.  Twins.  Hyperemesis Gravidarum.  Admit.  Supportive management.  Nutritionist consult.  MFM Consult.   HARPER,CHARLES A 05/10/2014, 1:31 AM

## 2014-05-10 NOTE — Progress Notes (Signed)
Ur chart review completed.  

## 2014-05-10 NOTE — Progress Notes (Signed)
  Subjective:    Lindsay Ramirez is a 28 y.o. female being seen today for her obstetrical visit. She is at [redacted]w[redacted]d gestation. Patient reports: nausea.  Problem List Items Addressed This Visit   None     Patient Active Problem List   Diagnosis Date Noted  . Hyperemesis affecting pregnancy, antepartum 05/09/2014  . Twin gestation, dichorionic diamniotic 05/04/2014  . Nausea/vomiting in pregnancy 04/30/2014  . Unspecified high-risk pregnancy 04/27/2014  . Migraine without aura 03/25/2014  . SAB (spontaneous abortion), with D&E 11/04/13 11/13/2013  . Endometritis s/p D&E for SAB 11/13/2013  . 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 Gout 06/15/2013  . Bipolar disorder, unspecified/depression 11/11/2012  . 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 Vitamin D deficiency 07/26/2012  . Acid reflux 06/21/2012  . BV (bacterial vaginosis) 06/21/2012    Objective:     BP 110/70  Temp(Src) 99.2 F (37.3 C)  Wt 197 lb (89.359 kg) Uterine Size: Below umbilicus     Assessment:    Pregnancy @ [redacted]w[redacted]d  weeks Hyperemesis.  Twins.  Plan:   Sent to [name] for further evaluation.  Problem list reviewed and updated. Labs reviewed.  Follow up in 4 weeks. FIRST/CF mutation testing/NIPT/QUAD SCREEN/fragile X/Ashkenazi Jewish population testing/Spinal muscular atrophy discussed: ordered. Role of ultrasound in pregnancy discussed; fetal survey: ordered. Amniocentesis discussed: not indicated.

## 2014-05-10 NOTE — Progress Notes (Signed)
I received a referral from pt's RN because pt was tearful. She was feeling very upset about being separated from her kids (ages 45, 28 and 2) and about the unexpected nature of her admission.  She communicated to her nurse that she wanted to go home.  I spent time offering her support and pastoral presence.  Please page as needs arise, [phone]  Chaplain Katy Rudolf Blizard 1:40 PM   05/10/14 1300  Clinical Encounter Type  Visited With Patient  Visit Type Spiritual support  Referral From Nurse

## 2014-05-10 NOTE — Progress Notes (Signed)
Pt is leaving AMA because she does not want to wait for discharge order. MD notified.

## 2014-05-10 NOTE — Progress Notes (Signed)
Nutrition Brief Note  Patient identified on the Malnutrition Screening Tool (MST) Report for weight loss > 10 lbs. Baseline weight at conception 200 Lbs  Wt Readings from Last 15 Encounters:  05/10/14 198 lb 4 oz (89.926 kg)  05/09/14 197 lb (89.359 kg)  05/04/14 198 lb (89.812 kg)  04/27/14 193 lb (87.544 kg)  04/27/14 194 lb (87.998 kg)  04/06/14 197 lb 9.6 oz (89.631 kg)  03/25/14 205 lb 3.2 oz (93.078 kg)  03/23/14 201 lb (91.173 kg)  03/21/14 200 lb 9.6 oz (90.992 kg)  01/03/14 199 lb (90.266 kg)  11/11/13 201 lb 3.2 oz (91.264 kg)  11/03/13 203 lb (92.08 kg)  10/31/13 203 lb (92.08 kg)  08/03/13 208 lb 14.4 oz (94.756 kg)  07/29/13 211 lb 6.4 oz (95.89 kg)    Body mass index is 36.25 kg/(m^2). Patient meets criteria for Obesity II based on current BMI.   Current diet order is Regular/BRAT,D5 1/2 NS at 150 ml/hr providing 612 Kcal/day. Labs and medications reviewed.   Resource Boost Breeze ordered TID. No other  nutrition interventions warranted at this time. If nutrition issues arise, please consult RD.   Weyman Rodney M.Fredderick Severance LDN Neonatal Nutrition Support Specialist/RD III Pager [phone]

## 2014-05-10 NOTE — Progress Notes (Signed)
Pt emotional about being away from family while in the hospital. The chaplain was notified and came to speak to the patient. Pt told MD this morning she wanted to be discharged. MD told pt he would discharge her tomorrow. Pt requested the MD to be notified again to tell him she wanted to go home. MD notified and said he would discharge pt to  home.

## 2014-05-12 ENCOUNTER — Other Ambulatory Visit: Payer: Self-pay | Admitting: Obstetrics

## 2014-05-12 NOTE — Discharge Summary (Signed)
Physician Discharge Summary  Patient ID: Lindsay Ramirez MRN: [id number] DOB/AGE: May 11, 1986 28 y.o.  Admit date: 05/09/2014 Discharge date: 05/12/2014  Admission Diagnoses: 17 weeks.  Nausea and Vomiting  Discharge Diagnoses: Same, improved Active Problems:   Hyperemesis affecting pregnancy, antepartum   Discharged Condition: good  Hospital Course: ;Admitted with N/V.  Responded well to therapy.  Discharged home in good condition, undelivered.  Consults: None  Significant Diagnostic Studies: labs: CBC, CMET  Treatments: IV hydration and antiemetics.  Discharge Exam: Blood pressure 110/49, pulse 72, temperature 98.5 F (36.9 C), temperature source Oral, resp. rate 18, height 5\' 2"  (1.575 m), weight 198 lb 4 oz (89.926 kg), SpO2 100.00%, not currently breastfeeding. General appearance: alert and no distress GI: normal findings: soft, non-tender Extremities: extremities normal, atraumatic, no cyanosis or edema  Disposition: 01-Home or Self Care     Medication List    ASK your doctor about these medications       DICLEGIS 10-10 MG Tbec  Generic drug:  Doxylamine-Pyridoxine  Take 3 tablets by mouth at bedtime.         Signed: Tyrese Ficek A 05/12/2014, 4:04 PM

## 2014-05-12 NOTE — Addendum Note (Signed)
Addended by: Ladona Ridgel on: 05/12/2014 10:47 AM   Modules accepted: Orders

## 2014-05-13 ENCOUNTER — Inpatient Hospital Stay (HOSPITAL_COMMUNITY)
Admission: AD | Admit: 2014-05-13 | Discharge: 2014-05-14 | Disposition: A | Discharge status: Home / Self Care | Payer: Medicaid Other | Source: Ambulatory Visit | Attending: Obstetrics | Admitting: Obstetrics

## 2014-05-13 ENCOUNTER — Encounter: Payer: Self-pay | Admitting: Obstetrics & Gynecology

## 2014-05-13 ENCOUNTER — Encounter (HOSPITAL_COMMUNITY): Payer: Self-pay | Admitting: *Deleted

## 2014-05-13 DIAGNOSIS — O30009 Twin pregnancy, unspecified number of placenta and unspecified number of amniotic sacs, unspecified trimester: Secondary | ICD-10-CM | POA: Insufficient documentation

## 2014-05-13 DIAGNOSIS — O30049 Twin pregnancy, dichorionic/diamniotic, unspecified trimester: Secondary | ICD-10-CM | POA: Insufficient documentation

## 2014-05-13 DIAGNOSIS — M940 Chondrocostal junction syndrome [Tietze]: Secondary | ICD-10-CM | POA: Diagnosis not present

## 2014-05-13 DIAGNOSIS — R300 Dysuria: Secondary | ICD-10-CM | POA: Insufficient documentation

## 2014-05-13 DIAGNOSIS — R109 Unspecified abdominal pain: Secondary | ICD-10-CM | POA: Insufficient documentation

## 2014-05-13 DIAGNOSIS — R112 Nausea with vomiting, unspecified: Secondary | ICD-10-CM | POA: Insufficient documentation

## 2014-05-13 DIAGNOSIS — O9989 Other specified diseases and conditions complicating pregnancy, childbirth and the puerperium: Principal | ICD-10-CM

## 2014-05-13 DIAGNOSIS — O26892 Other specified pregnancy related conditions, second trimester: Secondary | ICD-10-CM

## 2014-05-13 DIAGNOSIS — O99891 Other specified diseases and conditions complicating pregnancy: Secondary | ICD-10-CM | POA: Insufficient documentation

## 2014-05-13 DIAGNOSIS — IMO0002 Reserved for concepts with insufficient information to code with codable children: Secondary | ICD-10-CM | POA: Insufficient documentation

## 2014-05-13 LAB — URINALYSIS, ROUTINE W REFLEX MICROSCOPIC
Bilirubin Urine: NEGATIVE
Glucose, UA: NEGATIVE mg/dL
HGB URINE DIPSTICK: NEGATIVE
Ketones, ur: 15 mg/dL — AB
NITRITE: NEGATIVE
PROTEIN: NEGATIVE mg/dL
SPECIFIC GRAVITY, URINE: 1.015 (ref 1.005–1.030)
Urobilinogen, UA: 0.2 mg/dL (ref 0.0–1.0)
pH: 6 (ref 5.0–8.0)

## 2014-05-13 LAB — URINE MICROSCOPIC-ADD ON

## 2014-05-13 MED ORDER — ACETAMINOPHEN 325 MG PO TABS
650.0000 mg | ORAL_TABLET | Freq: Once | ORAL | Status: AC
  Administered 2014-05-13: 650 mg via ORAL
  Filled 2014-05-13: qty 2

## 2014-05-13 NOTE — MAU Note (Signed)
Pt c/o lower abd pain and SOB. SOB and abd pain started at 1600 today, and is intermittent. Hx of asthma, but pt states that she does not currently have an inhaler.

## 2014-05-13 NOTE — MAU Note (Signed)
Pt is having twins

## 2014-05-13 NOTE — MAU Note (Signed)
Abdominal pain in lower mid abd. About 2hours go had SOB walking to BR but better now. Some lower back pain and burns when i pee.

## 2014-05-13 NOTE — Progress Notes (Signed)
Quick Note:  Needs PNV w/vitamin D. Inform pt of Pap result. Plan colposcopy postpartum. ______

## 2014-05-13 NOTE — MAU Provider Note (Signed)
History     CSN: [id number]  Arrival date and time: 05/13/14 2106   First Provider Initiated Contact with Patient 05/13/14 2251      Chief Complaint  Patient presents with  . Abdominal Pain  . Dysuria   Abdominal Pain Associated symptoms include dysuria, nausea and vomiting (x1 today). Pertinent negatives include no constipation, diarrhea, frequency or hematuria.  Dysuria  Associated symptoms include nausea and vomiting (x1 today). Pertinent negatives include no flank pain, frequency, hematuria or urgency.    Pt is a O1B5102 at [redacted]w[redacted]d wks IUP pregnant with di/di twins.  Pt reports having intermittent SOB that started at 1600 today.  SOB lasted approximately an hour.  Pt reports walking at the time of the occurrence.  Pt reports intermittent chest pain that lasted approximately 30 minutes.  Pain was midchest with not radiation.  Also reports lower cramping that started at the same time.  Cramping is present at this time and is rated an 8/10.  Denies vaginal bleeding or leaking of fluid.  +dysuria that began today as well.    Past Medical History  Diagnosis Date  . Sickle cell trait   . Mental disorder   . Bipolar affective disorder   . Bipolar affective disorder 2006    TOOK MEDS X 3 YR  . Depression     PP 2013  . Abnormal Pap smear     LAST PAP 06/2011  . Asthma     [facility]; [facility]  . Infection     UTI  . Anemia     CHRONIC  . Headache(784.0)     SEVERAL TIMES/DAY  . Vaginal Pap smear, abnormal     Past Surgical History  Procedure Laterality Date  . Mouth surgery    . Wisdom tooth extraction  2012  . Vaginal delivery      X 3  . Dilation and evacuation N/A 11/04/2013    Procedure: DILATATION AND EVACUATION;  Surgeon: Alwyn Pea, MD;  Location: [facility];  Service: Gynecology;  Laterality: N/A;    Family History  Problem Relation Age of Onset  . Hypertension Mother   . Asthma Mother   . Heart disease Mother   . Hyperlipidemia Mother   . Thyroid  disease Mother   . Alcohol abuse Father   . Asthma Sister   . Depression Sister   . Diabetes Maternal Aunt   . HIV Maternal Aunt   . Cancer Maternal Grandmother 37    COLON  . Depression Maternal Grandmother   . Alcohol abuse Maternal Grandmother   . Drug abuse Maternal Grandmother   . Alcohol abuse Maternal Grandfather   . Drug abuse Maternal Grandfather   . Stroke Maternal Grandfather   . Alcohol abuse Paternal Grandfather   . Drug abuse Paternal Grandfather   . Asthma Sister     History  Substance Use Topics  . Smoking status: Never Smoker   . Smokeless tobacco: Never Used  . Alcohol Use: No    Allergies: No Known Allergies  Prescriptions prior to admission  Medication Sig Dispense Refill  . Doxylamine-Pyridoxine (DICLEGIS) 10-10 MG TBEC Take 3 tablets by mouth at bedtime.        Review of Systems  Respiratory: Positive for cough and shortness of breath. Negative for wheezing.   Cardiovascular: Positive for chest pain.  Gastrointestinal: Positive for nausea, vomiting (x1 today) and abdominal pain (cramping). Negative for diarrhea and constipation.  Genitourinary: Positive for dysuria. Negative for urgency, frequency, hematuria and  flank pain.  All other systems reviewed and are negative.  Physical Exam   Blood pressure 104/54, pulse 70, temperature 98.6 F (37 C), temperature source Oral, resp. rate 18, height 5\' 2"  (1.575 m), weight 90.266 kg (199 lb), SpO2 100.00%, not currently breastfeeding.  Physical Exam  Constitutional: She is oriented to person, place, and time. She appears well-developed and well-nourished.  HENT:  Head: Normocephalic.  Neck: Normal range of motion. Neck supple.  Cardiovascular: Normal rate, regular rhythm and normal heart sounds.   Respiratory: Effort normal and breath sounds normal. No respiratory distress. She has no wheezes. She has no rales. She exhibits tenderness.    GI: Soft. There is no tenderness.  Genitourinary: No  bleeding around the vagina. No vaginal discharge found.  Musculoskeletal: Normal range of motion. She exhibits no edema.  Neurological: She is alert and oriented to person, place, and time.  Skin: Skin is warm and dry.   Cervix - closed and thick  FHR Twin A 139/Twin B 155  MAU Course  Procedures Results for orders placed during the hospital encounter of 05/13/14 (from the past 24 hour(s))  URINALYSIS, ROUTINE W REFLEX MICROSCOPIC     Status: Abnormal   Collection Time    05/13/14  9:35 PM      Result Value Ref Range   Color, Urine YELLOW  YELLOW   APPearance CLEAR  CLEAR   Specific Gravity, Urine 1.015  1.005 - 1.030   pH 6.0  5.0 - 8.0   Glucose, UA NEGATIVE  NEGATIVE mg/dL   Hgb urine dipstick NEGATIVE  NEGATIVE   Bilirubin Urine NEGATIVE  NEGATIVE   Ketones, ur 15 (*) NEGATIVE mg/dL   Protein, ur NEGATIVE  NEGATIVE mg/dL   Urobilinogen, UA 0.2  0.0 - 1.0 mg/dL   Nitrite NEGATIVE  NEGATIVE   Leukocytes, UA SMALL (*) NEGATIVE  URINE MICROSCOPIC-ADD ON     Status: Abnormal   Collection Time    05/13/14  9:35 PM      Result Value Ref Range   Squamous Epithelial / LPF FEW (*) RARE   WBC, UA 3-6  <3 WBC/hpf   Bacteria, UA FEW (*) RARE   Urine-Other MUCOUS PRESENT      EKG Brady 58 bpm, otherwise normal 2340 Tylenol 650 mg PO  0010 Pt reports improvement in pain  Assessment and Plan  28 yo [id number] at [redacted]w[redacted]d wks IUP/Di-Di Twins Dysuria Costochondritis  Plan: Discharge to home Take tylenol OTC for pain in chest RX Macrobid 100 mg BID x 7 days Urine culture sent   Kathrine Haddock N 05/13/2014, 10:52 PM

## 2014-05-14 DIAGNOSIS — M940 Chondrocostal junction syndrome [Tietze]: Secondary | ICD-10-CM

## 2014-05-14 MED ORDER — NITROFURANTOIN MONOHYD MACRO 100 MG PO CAPS: 100.0000 mg | ORAL_CAPSULE | Freq: Two times a day (BID) | ORAL | Status: DC

## 2014-05-14 NOTE — Progress Notes (Signed)
Reina Fuse CNM in earlier to discuss d/c plan. WRitten and verbal d/c instructions given and understanding voiced.

## 2014-05-15 LAB — URINE CULTURE: Colony Count: 60000

## 2014-05-16 ENCOUNTER — Encounter: Payer: Self-pay | Admitting: Family

## 2014-05-16 DIAGNOSIS — Z862 Personal history of diseases of the blood and blood-forming organs and certain disorders involving the immune mechanism: Secondary | ICD-10-CM

## 2014-05-16 DIAGNOSIS — O2340 Unspecified infection of urinary tract in pregnancy, unspecified trimester: Secondary | ICD-10-CM

## 2014-05-16 DIAGNOSIS — B951 Streptococcus, group B, as the cause of diseases classified elsewhere: Secondary | ICD-10-CM | POA: Insufficient documentation

## 2014-05-17 ENCOUNTER — Ambulatory Visit: Payer: Medicaid Other

## 2014-05-17 MED ORDER — PRENATAL 19 29-1 MG PO CHEW: 1.0000 | CHEWABLE_TABLET | Freq: Every day | ORAL | Status: DC

## 2014-05-17 MED ORDER — FOLIC ACID 1 MG PO TABS: 1.0000 mg | ORAL_TABLET | Freq: Every day | ORAL | Status: DC

## 2014-05-17 MED ORDER — FERROUS SULFATE 325 (65 FE) MG PO TABS: 325.0000 mg | ORAL_TABLET | Freq: Every day | ORAL | Status: DC

## 2014-05-18 ENCOUNTER — Institutional Professional Consult (permissible substitution): Payer: Medicaid Other

## 2014-05-18 ENCOUNTER — Encounter: Payer: Self-pay | Admitting: Obstetrics & Gynecology

## 2014-05-18 ENCOUNTER — Ambulatory Visit (INDEPENDENT_AMBULATORY_CARE_PROVIDER_SITE_OTHER): Payer: Medicaid Other | Admitting: Obstetrics & Gynecology

## 2014-05-18 ENCOUNTER — Encounter: Payer: Medicaid Other | Admitting: Obstetrics & Gynecology

## 2014-05-18 VITALS — BP 104/68 | HR 78 | Temp 98.5°F | Wt 196.0 lb

## 2014-05-18 DIAGNOSIS — Z3480 Encounter for supervision of other normal pregnancy, unspecified trimester: Secondary | ICD-10-CM

## 2014-05-18 DIAGNOSIS — Z3482 Encounter for supervision of other normal pregnancy, second trimester: Secondary | ICD-10-CM

## 2014-05-18 LAB — POCT URINALYSIS DIPSTICK
Bilirubin, UA: NEGATIVE
GLUCOSE UA: NEGATIVE
Ketones, UA: NEGATIVE
NITRITE UA: NEGATIVE
SPEC GRAV UA: 1.015
UROBILINOGEN UA: NEGATIVE
pH, UA: 6

## 2014-05-18 MED ORDER — TRAZODONE HCL 50 MG PO TABS: 50.0000 mg | ORAL_TABLET | Freq: Every day | ORAL | Status: DC

## 2014-05-18 MED ORDER — SERTRALINE HCL 50 MG PO TABS: 50.0000 mg | ORAL_TABLET | Freq: Every day | ORAL | Status: DC

## 2014-05-19 ENCOUNTER — Encounter: Payer: Medicaid Other | Admitting: Obstetrics & Gynecology

## 2014-05-21 NOTE — Patient Instructions (Signed)
Second Trimester of Pregnancy The second trimester is from week 13 through week 28, months 4 through 6. The second trimester is often a time when you feel your best. Your body has also adjusted to being pregnant, and you begin to feel better physically. Usually, morning sickness has lessened or quit completely, you may have more energy, and you may have an increase in appetite. The second trimester is also a time when the fetus is growing rapidly. At the end of the sixth month, the fetus is about 9 inches long and weighs about 1 pounds. You will likely begin to feel the baby move (quickening) between 18 and 20 weeks of the pregnancy. BODY CHANGES Your body goes through many changes during pregnancy. The changes vary from woman to woman.   Your weight will continue to increase. You will notice your lower abdomen bulging out.  You may begin to get stretch marks on your hips, abdomen, and breasts.  You may develop headaches that can be relieved by medicines approved by your health care provider.  You may urinate more often because the fetus is pressing on your bladder.  You may develop or continue to have heartburn as a result of your pregnancy.  You may develop constipation because certain hormones are causing the muscles that push waste through your intestines to slow down.  You may develop hemorrhoids or swollen, bulging veins (varicose veins).  You may have back pain because of the weight gain and pregnancy hormones relaxing your joints between the bones in your pelvis and as a result of a shift in weight and the muscles that support your balance.  Your breasts will continue to grow and be tender.  Your gums may bleed and may be sensitive to brushing and flossing.  Dark spots or blotches (chloasma, mask of pregnancy) may develop on your face. This will likely fade after the baby is born.  A dark line from your belly button to the pubic area (linea nigra) may appear. This will likely fade  after the baby is born.  You may have changes in your hair. These can include thickening of your hair, rapid growth, and changes in texture. Some women also have hair loss during or after pregnancy, or hair that feels dry or thin. Your hair will most likely return to normal after your baby is born. WHAT TO EXPECT AT YOUR PRENATAL VISITS During a routine prenatal visit:  You will be weighed to make sure you and the fetus are growing normally.  Your blood pressure will be taken.  Your abdomen will be measured to track your baby's growth.  The fetal heartbeat will be listened to.  Any test results from the previous visit will be discussed. Your health care provider may ask you:  How you are feeling.  If you are feeling the baby move.  If you have had any abnormal symptoms, such as leaking fluid, bleeding, severe headaches, or abdominal cramping.  If you have any questions. Other tests that may be performed during your second trimester include:  Blood tests that check for:  Low iron levels (anemia).  Gestational diabetes (between 24 and 28 weeks).  Rh antibodies.  Urine tests to check for infections, diabetes, or protein in the urine.  An ultrasound to confirm the proper growth and development of the baby.  An amniocentesis to check for possible genetic problems.  Fetal screens for spina bifida and Down syndrome. HOME CARE INSTRUCTIONS   Avoid all smoking, herbs, alcohol, and unprescribed   drugs. These chemicals affect the formation and growth of the baby.  Follow your health care provider's instructions regarding medicine use. There are medicines that are either safe or unsafe to take during pregnancy.  Exercise only as directed by your health care provider. Experiencing uterine cramps is a good sign to stop exercising.  Continue to eat regular, healthy meals.  Wear a good support bra for breast tenderness.  Do not use hot tubs, steam rooms, or saunas.  Wear your  seat belt at all times when driving.  Avoid raw meat, uncooked cheese, cat litter boxes, and soil used by cats. These carry germs that can cause birth defects in the baby.  Take your prenatal vitamins.  Try taking a stool softener (if your health care provider approves) if you develop constipation. Eat more high-fiber foods, such as fresh vegetables or fruit and whole grains. Drink plenty of fluids to keep your urine clear or pale yellow.  Take warm sitz baths to soothe any pain or discomfort caused by hemorrhoids. Use hemorrhoid cream if your health care provider approves.  If you develop varicose veins, wear support hose. Elevate your feet for 15 minutes, 3-4 times a day. Limit salt in your diet.  Avoid heavy lifting, wear low heel shoes, and practice good posture.  Rest with your legs elevated if you have leg cramps or low back pain.  Visit your dentist if you have not gone yet during your pregnancy. Use a soft toothbrush to brush your teeth and be gentle when you floss.  A sexual relationship may be continued unless your health care provider directs you otherwise.  Continue to go to all your prenatal visits as directed by your health care provider. SEEK MEDICAL CARE IF:   You have dizziness.  You have mild pelvic cramps, pelvic pressure, or nagging pain in the abdominal area.  You have persistent nausea, vomiting, or diarrhea.  You have a bad smelling vaginal discharge.  You have pain with urination. SEEK IMMEDIATE MEDICAL CARE IF:   You have a fever.  You are leaking fluid from your vagina.  You have spotting or bleeding from your vagina.  You have severe abdominal cramping or pain.  You have rapid weight gain or loss.  You have shortness of breath with chest pain.  You notice sudden or extreme swelling of your face, hands, ankles, feet, or legs.  You have not felt your baby move in over an hour.  You have severe headaches that do not go away with  medicine.  You have vision changes. Document Released: 08/19/2001 Document Revised: 08/30/2013 Document Reviewed: 10/26/2012 Exit[employer] Patient Information 2015 [employer]. This information is not intended to replace advice given to you by your health care provider. Make sure you discuss any questions you have with your health care provider.  

## 2014-05-21 NOTE — Progress Notes (Signed)
Subjective:    Lindsay Ramirez is being seen today for her obstetrical visit.  She is at [redacted]w[redacted]d gestation.  Patient reports no complaints.   Fetal Movement: normal.   Problem List Items Addressed This Visit   None    Visit Diagnoses   Encounter for supervision of other normal pregnancy in second trimester    -  Primary    Relevant Medications       sertraline (ZOLOFT) tablet       traZODone (DESYREL) tablet    Other Relevant Orders       POCT urinalysis dipstick (Completed)      Patient Active Problem List   Diagnosis Date Noted  . GBS (group B streptococcus) UTI complicating pregnancy 11/65/7903    Priority: High  . Twin gestation, dichorionic diamniotic 05/04/2014    Priority: High  . Unspecified high-risk pregnancy 04/27/2014    Priority: High  . Bipolar disorder, unspecified/depression 11/11/2012    Priority: High  . 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 Hyperemesis affecting pregnancy, antepartum 05/09/2014  . Nausea/vomiting in pregnancy 04/30/2014  . Migraine without aura 03/25/2014  . SAB (spontaneous abortion), with D&E 11/04/13 11/13/2013  . Endometritis s/p D&E for SAB 11/13/2013  . 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 Gout 06/15/2013  . Vitamin D deficiency 07/26/2012  . Acid reflux 06/21/2012  . BV (bacterial vaginosis) 06/21/2012    Objective:    BP 104/68  Pulse 78  Temp(Src) 98.5 F (36.9 C)  Wt 88.905 kg (196 lb) FHT:  Baby A: 160 BPM;  Baby B:  160 BPM  Uterine Size: size equals dates     Assessment:    Pregnancy @ [redacted]w[redacted]d . Twins, diamniotic, dichorionic.   Plan:    Fetal survey discussed fetal survey results reviewed. Reviewed signs and symptoms of premature labor and PROM.   Discussed the potential for activity modification and planning for maternity leave. Follow-up: 4 weeks.

## 2014-05-22 ENCOUNTER — Telehealth: Payer: Self-pay | Admitting: *Deleted

## 2014-05-22 NOTE — Telephone Encounter (Signed)
Patient called stating she woke up last night with really bad back pain and could barely walk. Patient states she has had a headache for the past 2 days. Patient states she has been vomiting and that every time she vomits she pees on herself. Patient notified that based off her symptoms that we suggest she go to [facility] to be checked out. Patient advised that they would probably give her fluids since she had been vomiting and that they would probably give her something for her headaches. Patient voiced understanding.

## 2014-05-31 ENCOUNTER — Telehealth: Payer: Self-pay | Admitting: *Deleted

## 2014-05-31 NOTE — Telephone Encounter (Signed)
Pt placed call to office with bleeding concerns. Return call to pt, no answer. Left message making patient aware that if she is having any bleeding concerns that she should be seen in the emergency room at [facility]  Pt advised to contact office.

## 2014-06-01 ENCOUNTER — Encounter (HOSPITAL_COMMUNITY): Payer: Self-pay | Admitting: *Deleted

## 2014-06-01 ENCOUNTER — Institutional Professional Consult (permissible substitution): Payer: Medicaid Other

## 2014-06-01 ENCOUNTER — Encounter: Payer: Self-pay | Admitting: Obstetrics & Gynecology

## 2014-06-01 ENCOUNTER — Inpatient Hospital Stay (HOSPITAL_COMMUNITY)
Admission: AD | Admit: 2014-06-01 | Discharge: 2014-06-01 | Disposition: A | Discharge status: Home / Self Care | Payer: Medicaid Other | Source: Ambulatory Visit | Attending: Obstetrics | Admitting: Obstetrics

## 2014-06-01 ENCOUNTER — Ambulatory Visit (INDEPENDENT_AMBULATORY_CARE_PROVIDER_SITE_OTHER): Payer: Medicaid Other | Admitting: Obstetrics & Gynecology

## 2014-06-01 VITALS — BP 101/61 | HR 64 | Temp 98.6°F | Wt 194.0 lb

## 2014-06-01 DIAGNOSIS — O30042 Twin pregnancy, dichorionic/diamniotic, second trimester: Secondary | ICD-10-CM

## 2014-06-01 DIAGNOSIS — O99891 Other specified diseases and conditions complicating pregnancy: Secondary | ICD-10-CM | POA: Diagnosis not present

## 2014-06-01 DIAGNOSIS — Z3480 Encounter for supervision of other normal pregnancy, unspecified trimester: Secondary | ICD-10-CM

## 2014-06-01 DIAGNOSIS — O30009 Twin pregnancy, unspecified number of placenta and unspecified number of amniotic sacs, unspecified trimester: Secondary | ICD-10-CM | POA: Diagnosis not present

## 2014-06-01 DIAGNOSIS — O0990 Supervision of high risk pregnancy, unspecified, unspecified trimester: Secondary | ICD-10-CM

## 2014-06-01 DIAGNOSIS — N949 Unspecified condition associated with female genital organs and menstrual cycle: Secondary | ICD-10-CM | POA: Insufficient documentation

## 2014-06-01 DIAGNOSIS — O9989 Other specified diseases and conditions complicating pregnancy, childbirth and the puerperium: Secondary | ICD-10-CM

## 2014-06-01 DIAGNOSIS — O219 Vomiting of pregnancy, unspecified: Secondary | ICD-10-CM

## 2014-06-01 DIAGNOSIS — O30049 Twin pregnancy, dichorionic/diamniotic, unspecified trimester: Secondary | ICD-10-CM | POA: Diagnosis not present

## 2014-06-01 DIAGNOSIS — R109 Unspecified abdominal pain: Secondary | ICD-10-CM | POA: Insufficient documentation

## 2014-06-01 DIAGNOSIS — Z3482 Encounter for supervision of other normal pregnancy, second trimester: Secondary | ICD-10-CM

## 2014-06-01 DIAGNOSIS — O4702 False labor before 37 completed weeks of gestation, second trimester: Secondary | ICD-10-CM

## 2014-06-01 MED ORDER — LACTATED RINGERS IV BOLUS (SEPSIS): 1000.0000 mL | Freq: Once | INTRAVENOUS | Status: DC

## 2014-06-01 MED ORDER — MECLIZINE HCL 25 MG PO TABS: 25.0000 mg | ORAL_TABLET | Freq: Three times a day (TID) | ORAL | Status: DC | PRN

## 2014-06-01 MED ORDER — TERBUTALINE SULFATE 1 MG/ML IJ SOLN
0.2500 mg | Freq: Once | INTRAMUSCULAR | Status: AC
  Administered 2014-06-01: 0.25 mg via SUBCUTANEOUS
  Filled 2014-06-01: qty 1

## 2014-06-01 MED ORDER — BUTALBITAL-APAP-CAFFEINE 50-325-40 MG PO TABS
1.0000 | ORAL_TABLET | Freq: Four times a day (QID) | ORAL | Status: DC | PRN
Start: 2014-06-01 — End: 2014-06-05

## 2014-06-01 NOTE — MAU Provider Note (Signed)
History     CSN: [id number]  Arrival date and time: 06/01/14 8127   First Provider Initiated Contact with Patient 06/01/14 2037      Chief Complaint  Patient presents with  . Abdominal Pain   Abdominal Pain Associated symptoms include constipation, nausea and vomiting (x 2 in past 24 hours). Pertinent negatives include no diarrhea or fever.    Pt is a 28 yo [id number] at [redacted]w[redacted]d wks IUP with di/di twins sent from office for lower pelvic cramping.  Reports feeling cramping that started on Tuesday.  Cervix checked today and told it was closed.  Sent here for monitoring.  Last intercourse three weeks ago.  Denies vaginal bleeding or leaking of fluid.  No report of abnormal vaginal discharge.    Past Medical History  Diagnosis Date  . Sickle cell trait   . Mental disorder   . Bipolar affective disorder   . Bipolar affective disorder 2006    TOOK MEDS X 3 YR  . Depression     PP 2013  . Abnormal Pap smear     LAST PAP 06/2011  . Asthma     [facility]; [facility]  . Infection     UTI  . Anemia     CHRONIC  . Headache(784.0)     SEVERAL TIMES/DAY  . Vaginal Pap smear, abnormal     Past Surgical History  Procedure Laterality Date  . Mouth surgery    . Wisdom tooth extraction  2012  . Vaginal delivery      X 3  . Dilation and evacuation N/A 11/04/2013    Procedure: DILATATION AND EVACUATION;  Surgeon: Alwyn Pea, MD;  Location: [facility];  Service: Gynecology;  Laterality: N/A;    Family History  Problem Relation Age of Onset  . Hypertension Mother   . Asthma Mother   . Heart disease Mother   . Hyperlipidemia Mother   . Thyroid disease Mother   . Alcohol abuse Father   . Asthma Sister   . Depression Sister   . Diabetes Maternal Aunt   . HIV Maternal Aunt   . Cancer Maternal Grandmother 37    COLON  . Depression Maternal Grandmother   . Alcohol abuse Maternal Grandmother   . Drug abuse Maternal Grandmother   . Alcohol abuse Maternal Grandfather   . Drug abuse  Maternal Grandfather   . Stroke Maternal Grandfather   . Alcohol abuse Paternal Grandfather   . Drug abuse Paternal Grandfather   . Asthma Sister     History  Substance Use Topics  . Smoking status: Never Smoker   . Smokeless tobacco: Never Used  . Alcohol Use: No    Allergies: No Known Allergies  Prescriptions prior to admission  Medication Sig Dispense Refill  . Doxylamine-Pyridoxine (DICLEGIS) 10-10 MG TBEC Take 3 tablets by mouth at bedtime.      . ferrous sulfate 325 (65 FE) MG tablet Take 1 tablet (325 mg total) by mouth daily with breakfast.  30 tablet  5  . folic acid (FOLVITE) 1 MG tablet Take 1 tablet (1 mg total) by mouth daily.  1 tablet  11  . Prenatal Vit-Fe Fumarate-FA (PRENATAL MULTIVITAMIN) TABS tablet Take 1 tablet by mouth daily at 12 noon.      . sertraline (ZOLOFT) 50 MG tablet Take 1 tablet (50 mg total) by mouth daily.  30 tablet  6  . traZODone (DESYREL) 50 MG tablet Take 1 tablet (50 mg total) by mouth at bedtime.  30 tablet  3  . butalbital-acetaminophen-caffeine (FIORICET) 50-325-40 MG per tablet Take 1-2 tablets by mouth every 6 (six) hours as needed for headache.  60 tablet  0  . meclizine (ANTIVERT) 25 MG tablet Take 1 tablet (25 mg total) by mouth 3 (three) times daily as needed.  60 tablet  3    Review of Systems  Constitutional: Negative for fever and chills.  Gastrointestinal: Positive for nausea, vomiting (x 2 in past 24 hours), abdominal pain (cramping) and constipation. Negative for diarrhea.  Genitourinary: Negative.   All other systems reviewed and are negative.  Physical Exam   Blood pressure 112/51, pulse 68, temperature 98.3 F (36.8 C), temperature source Oral, resp. rate 20, height 5\' 1"  (1.549 m), weight 88.678 kg (195 lb 8 oz), not currently breastfeeding.  Physical Exam  Constitutional: She is oriented to person, place, and time. She appears well-developed and well-nourished. No distress.  HENT:  Head: Normocephalic.  Neck:  Normal range of motion. Neck supple.  Cardiovascular: Normal rate, regular rhythm and normal heart sounds.   Respiratory: Effort normal and breath sounds normal.  GI: Soft. There is no tenderness.  Genitourinary: No bleeding around the vagina.  Neurological: She is alert and oriented to person, place, and time.  Skin: Skin is warm and dry.   Toco - uterine irritability  Consulted with Dr. Ruthann Cancer > reviewed HPI/exam > give subq terbutaline and reassess MAU Course  Procedures 2115 Report given to J. Mason Dibiasio who assumes care of the patient. Venia Carbon Michiel Cowboy, CNM  MDM SQ terbutaline given. UI has stopped. Patient reports improvement in symptoms and requests discharge at this time.    Assessment and Plan  A: Twin gestation at [redacted]w[redacted]d Abdominal pain in pregnancy  P: Discharge home Patient advised to increased PO hydration as tolerated Patient encouraged to follow-up with Dr. Jodi Mourning as scheduled PTL precautions discussed Patient may return to MAU as needed or if her condition were to change or worsen  Luvenia Redden, PA-C 06/01/2014 10:13 PM

## 2014-06-01 NOTE — MAU Note (Signed)
PT  SAYS SHE STARTED FEELING CRAMPING ON Tuesday-  WHILE IN BED.     CONTINUED  THE SAME.   SHE WENT TO OFFICE TODAY  FOR [facility]  VISIT  -- SAW DR J-M.   VE - CLOSED.   ALSO COLLECTED UA.   AND TOLD TO COME HERE FOR MONITORING.   LAST SEX-   3 WEEKS   AGO.    DENIES HSV AND MRSA.

## 2014-06-01 NOTE — Discharge Instructions (Signed)
Braxton Hicks Contractions °Contractions of the uterus can occur throughout pregnancy. Contractions are not always a sign that you are in labor.  °WHAT ARE BRAXTON HICKS CONTRACTIONS?  °Contractions that occur before labor are called Braxton Hicks contractions, or false labor. Toward the end of pregnancy (32-34 weeks), these contractions can develop more often and may become more forceful. This is not true labor because these contractions do not result in opening (dilatation) and thinning of the cervix. They are sometimes difficult to tell apart from true labor because these contractions can be forceful and people have different pain tolerances. You should not feel embarrassed if you go to the hospital with false labor. Sometimes, the only way to tell if you are in true labor is for your health care provider to look for changes in the cervix. °If there are no prenatal problems or other health problems associated with the pregnancy, it is completely safe to be sent home with false labor and await the onset of true labor. °HOW CAN YOU TELL THE DIFFERENCE BETWEEN TRUE AND FALSE LABOR? °False Labor °· The contractions of false labor are usually shorter and not as hard as those of true labor.   °· The contractions are usually irregular.   °· The contractions are often felt in the front of the lower abdomen and in the groin.   °· The contractions may go away when you walk around or change positions while lying down.   °· The contractions get weaker and are shorter lasting as time goes on.   °· The contractions do not usually become progressively stronger, regular, and closer together as with true labor.   °True Labor °· Contractions in true labor last 30-70 seconds, become very regular, usually become more intense, and increase in frequency.   °· The contractions do not go away with walking.   °· The discomfort is usually felt in the top of the uterus and spreads to the lower abdomen and low back.   °· True labor can be  determined by your health care provider with an exam. This will show that the cervix is dilating and getting thinner.   °WHAT TO REMEMBER °· Keep up with your usual exercises and follow other instructions given by your health care provider.   °· Take medicines as directed by your health care provider.   °· Keep your regular prenatal appointments.   °· Eat and drink lightly if you think you are going into labor.   °· If Braxton Hicks contractions are making you uncomfortable:   °¨ Change your position from lying down or resting to walking, or from walking to resting.   °¨ Sit and rest in a tub of warm water.   °¨ Drink 2-3 glasses of water. Dehydration may cause these contractions.   °¨ Do slow and deep breathing several times an hour.   °WHEN SHOULD I SEEK IMMEDIATE MEDICAL CARE? °Seek immediate medical care if: °· Your contractions become stronger, more regular, and closer together.   °· You have fluid leaking or gushing from your vagina.   °· You have a fever.   °· You pass blood-tinged mucus.   °· You have vaginal bleeding.   °· You have continuous abdominal pain.   °· You have low back pain that you never had before.   °· You feel your baby's head pushing down and causing pelvic pressure.   °· Your baby is not moving as much as it used to.   °Document Released: 08/25/2005 Document Revised: 08/30/2013 Document Reviewed: 06/06/2013 °[employer]® Patient Information ©2015 [employer]. This information is not intended to replace advice given to you by your health care   provider. Make sure you discuss any questions you have with your health care provider. ° °

## 2014-06-01 NOTE — Telephone Encounter (Signed)
Patient in office 06/01/2014. Patient states she was vomiting and she saw blood and had a nose bleed. Patient did not go to the hospital for that - she is evaluated in the office today.

## 2014-06-02 ENCOUNTER — Encounter (HOSPITAL_COMMUNITY): Payer: Self-pay | Admitting: *Deleted

## 2014-06-02 ENCOUNTER — Inpatient Hospital Stay (HOSPITAL_COMMUNITY)
Admission: AD | Admit: 2014-06-02 | Discharge: 2014-06-02 | Disposition: A | Discharge status: Home / Self Care | Payer: Medicaid Other | Source: Ambulatory Visit | Attending: Obstetrics & Gynecology | Admitting: Obstetrics & Gynecology

## 2014-06-02 DIAGNOSIS — O219 Vomiting of pregnancy, unspecified: Secondary | ICD-10-CM

## 2014-06-02 DIAGNOSIS — R109 Unspecified abdominal pain: Secondary | ICD-10-CM | POA: Diagnosis present

## 2014-06-02 DIAGNOSIS — A084 Viral intestinal infection, unspecified: Secondary | ICD-10-CM

## 2014-06-02 DIAGNOSIS — O0990 Supervision of high risk pregnancy, unspecified, unspecified trimester: Secondary | ICD-10-CM

## 2014-06-02 DIAGNOSIS — O9989 Other specified diseases and conditions complicating pregnancy, childbirth and the puerperium: Principal | ICD-10-CM

## 2014-06-02 DIAGNOSIS — O30009 Twin pregnancy, unspecified number of placenta and unspecified number of amniotic sacs, unspecified trimester: Secondary | ICD-10-CM | POA: Insufficient documentation

## 2014-06-02 DIAGNOSIS — O30042 Twin pregnancy, dichorionic/diamniotic, second trimester: Secondary | ICD-10-CM

## 2014-06-02 DIAGNOSIS — O99891 Other specified diseases and conditions complicating pregnancy: Secondary | ICD-10-CM | POA: Diagnosis not present

## 2014-06-02 DIAGNOSIS — R197 Diarrhea, unspecified: Secondary | ICD-10-CM | POA: Insufficient documentation

## 2014-06-02 DIAGNOSIS — A088 Other specified intestinal infections: Secondary | ICD-10-CM

## 2014-06-02 DIAGNOSIS — IMO0002 Reserved for concepts with insufficient information to code with codable children: Secondary | ICD-10-CM

## 2014-06-02 LAB — WET PREP BY MOLECULAR PROBE
CANDIDA SPECIES: NEGATIVE
Gardnerella vaginalis: NEGATIVE
Trichomonas vaginosis: NEGATIVE

## 2014-06-02 LAB — CBC WITH DIFFERENTIAL/PLATELET
Basophils Absolute: 0 10*3/uL (ref 0.0–0.1)
Basophils Relative: 0 % (ref 0–1)
Eosinophils Absolute: 0.1 10*3/uL (ref 0.0–0.7)
Eosinophils Relative: 1 % (ref 0–5)
HEMATOCRIT: 30.5 % — AB (ref 36.0–46.0)
Hemoglobin: 10.2 g/dL — ABNORMAL LOW (ref 12.0–15.0)
LYMPHS PCT: 19 % (ref 12–46)
Lymphs Abs: 1.8 10*3/uL (ref 0.7–4.0)
MCH: 26.8 pg (ref 26.0–34.0)
MCHC: 33.4 g/dL (ref 30.0–36.0)
MCV: 80.3 fL (ref 78.0–100.0)
Monocytes Absolute: 0.9 10*3/uL (ref 0.1–1.0)
Monocytes Relative: 9 % (ref 3–12)
NEUTROS ABS: 6.8 10*3/uL (ref 1.7–7.7)
Neutrophils Relative %: 71 % (ref 43–77)
Platelets: 217 10*3/uL (ref 150–400)
RBC: 3.8 MIL/uL — AB (ref 3.87–5.11)
RDW: 13.5 % (ref 11.5–15.5)
WBC: 9.6 10*3/uL (ref 4.0–10.5)

## 2014-06-02 LAB — BASIC METABOLIC PANEL
ANION GAP: 12 (ref 5–15)
BUN: <3 mg/dL — ABNORMAL LOW (ref 6–23)
CHLORIDE: 104 meq/L (ref 96–112)
CO2: 21 meq/L (ref 19–32)
CREATININE: 0.55 mg/dL (ref 0.50–1.10)
Calcium: 8.8 mg/dL (ref 8.4–10.5)
GFR calc Af Amer: >90 mL/min (ref >90)
GFR calc non Af Amer: >90 mL/min (ref >90)
Glucose, Bld: 76 mg/dL (ref 70–99)
Potassium: 3.5 mEq/L — ABNORMAL LOW (ref 3.7–5.3)
Sodium: 137 mEq/L (ref 137–147)

## 2014-06-02 LAB — URINALYSIS, ROUTINE W REFLEX MICROSCOPIC
Glucose, UA: NEGATIVE mg/dL
Hgb urine dipstick: NEGATIVE
Ketones, ur: 40 mg/dL — AB
LEUKOCYTES UA: NEGATIVE
Nitrite: NEGATIVE
PROTEIN: NEGATIVE mg/dL
SPECIFIC GRAVITY, URINE: 1.015 (ref 1.005–1.030)
Urobilinogen, UA: 4 mg/dL — ABNORMAL HIGH (ref 0.0–1.0)
pH: 6 (ref 5.0–8.0)

## 2014-06-02 LAB — CULTURE, OB URINE

## 2014-06-02 MED ORDER — PROMETHAZINE HCL 25 MG/ML IJ SOLN
12.5000 mg | Freq: Once | INTRAMUSCULAR | Status: AC
  Administered 2014-06-02: 12:00:00 via INTRAVENOUS
  Filled 2014-06-02: qty 1

## 2014-06-02 MED ORDER — LACTATED RINGERS IV BOLUS (SEPSIS)
1000.0000 mL | Freq: Once | INTRAVENOUS | Status: AC
  Administered 2014-06-02: 1000 mL via INTRAVENOUS

## 2014-06-02 MED ORDER — ONDANSETRON 4 MG PO TBDP: 4.0000 mg | ORAL_TABLET | Freq: Four times a day (QID) | ORAL | Status: DC | PRN

## 2014-06-02 NOTE — MAU Provider Note (Signed)
History     CSN: [id number]  Arrival date and time: 06/02/14 1001   First Provider Initiated Contact with Patient 06/02/14 1037      Chief Complaint  Patient presents with  . Emesis  . Diarrhea  . Abdominal Cramping   HPI This is a 28 y.o. female at [redacted]w[redacted]d who presents with c/o colicky abdominal pain all over abdomen, comes and goes.  Diarrhea started today. Has some nausea but no vomiting. No fever.   RN Note:  Was here yesterday with contractions. Awakened at 0500 with sudden abd pains, diarrhea started and has vomited. No one else at home is sick. Denies feverl       OB History   Grav Para Term Preterm Abortions TAB SAB Ect Mult Living   6 4 3 1 1  0 1 0 0 4      Past Medical History  Diagnosis Date  . Sickle cell trait   . Mental disorder   . Bipolar affective disorder   . Bipolar affective disorder 2006    TOOK MEDS X 3 YR  . Depression     PP 2013  . Abnormal Pap smear     LAST PAP 06/2011  . Asthma     [facility]; [facility]  . Infection     UTI  . Anemia     CHRONIC  . Headache(784.0)     SEVERAL TIMES/DAY  . Vaginal Pap smear, abnormal     Past Surgical History  Procedure Laterality Date  . Mouth surgery    . Wisdom tooth extraction  2012  . Vaginal delivery      X 3  . Dilation and evacuation N/A 11/04/2013    Procedure: DILATATION AND EVACUATION;  Surgeon: Alwyn Pea, MD;  Location: [facility];  Service: Gynecology;  Laterality: N/A;    Family History  Problem Relation Age of Onset  . Hypertension Mother   . Asthma Mother   . Heart disease Mother   . Hyperlipidemia Mother   . Thyroid disease Mother   . Alcohol abuse Father   . Asthma Sister   . Depression Sister   . Diabetes Maternal Aunt   . HIV Maternal Aunt   . Cancer Maternal Grandmother 37    COLON  . Depression Maternal Grandmother   . Alcohol abuse Maternal Grandmother   . Drug abuse Maternal Grandmother   . Alcohol abuse Maternal Grandfather   . Drug abuse Maternal  Grandfather   . Stroke Maternal Grandfather   . Alcohol abuse Paternal Grandfather   . Drug abuse Paternal Grandfather   . Asthma Sister     History  Substance Use Topics  . Smoking status: Never Smoker   . Smokeless tobacco: Never Used  . Alcohol Use: No    Allergies: No Known Allergies  Prescriptions prior to admission  Medication Sig Dispense Refill  . Doxylamine-Pyridoxine (DICLEGIS) 10-10 MG TBEC Take 3 tablets by mouth at bedtime.      . ferrous sulfate 325 (65 FE) MG tablet Take 1 tablet (325 mg total) by mouth daily with breakfast.  30 tablet  5  . folic acid (FOLVITE) 1 MG tablet Take 1 tablet (1 mg total) by mouth daily.  1 tablet  11  . Prenatal Vit-Fe Fumarate-FA (PRENATAL MULTIVITAMIN) TABS tablet Take 1 tablet by mouth daily at 12 noon.      . sertraline (ZOLOFT) 50 MG tablet Take 1 tablet (50 mg total) by mouth daily.  30 tablet  6  .  traZODone (DESYREL) 50 MG tablet Take 1 tablet (50 mg total) by mouth at bedtime.  30 tablet  3  . butalbital-acetaminophen-caffeine (FIORICET) 50-325-40 MG per tablet Take 1-2 tablets by mouth every 6 (six) hours as needed for headache.  60 tablet  0  . meclizine (ANTIVERT) 25 MG tablet Take 1 tablet (25 mg total) by mouth 3 (three) times daily as needed.  60 tablet  3    Review of Systems  Constitutional: Negative for fever, chills and malaise/fatigue.  Gastrointestinal: Positive for nausea, abdominal pain and diarrhea. Negative for vomiting and constipation.  Neurological: Negative for dizziness and weakness.   Physical Exam   Blood pressure 109/55, pulse 60, temperature 99.1 F (37.3 C), temperature source Oral, resp. rate 18, height 5\' 1"  (1.549 m), weight 192 lb (87.091 kg), not currently breastfeeding.  Physical Exam  Constitutional: She is oriented to person, place, and time. She appears well-developed and well-nourished. No distress.  Cardiovascular: Normal rate.   Respiratory: Effort normal.  GI: Soft. She exhibits no  distension. There is tenderness (mild, diffuse tenderness). There is no rebound and no guarding.  Musculoskeletal: Normal range of motion.  Neurological: She is alert and oriented to person, place, and time.  Skin: Skin is warm and dry.  Psychiatric: She has a normal mood and affect.    MAU Course  Procedures  MDM IV hydration given States feels better after IV and Phenergan  Assessment and Plan  A:  Twin IUP at [redacted]w[redacted]d        Probable viral gastroenteritis    P:  Discharge home       Followup in office  [facility] 06/02/2014, 12:40 PM

## 2014-06-02 NOTE — MAU Note (Signed)
Was here yesterday with contractions. Awakened at 0500 with sudden abd pains, diarrhea started and has vomited. No one else at home is sick. Denies feverl

## 2014-06-02 NOTE — Discharge Instructions (Signed)

## 2014-06-05 ENCOUNTER — Inpatient Hospital Stay (HOSPITAL_COMMUNITY)
Admission: AD | Admit: 2014-06-05 | Discharge: 2014-06-05 | Disposition: A | Discharge status: Home / Self Care | Payer: Medicaid Other | Source: Ambulatory Visit | Attending: Obstetrics | Admitting: Obstetrics

## 2014-06-05 ENCOUNTER — Other Ambulatory Visit: Payer: Self-pay | Admitting: Obstetrics & Gynecology

## 2014-06-05 ENCOUNTER — Encounter (HOSPITAL_COMMUNITY): Payer: Self-pay

## 2014-06-05 ENCOUNTER — Ambulatory Visit (HOSPITAL_COMMUNITY)
Admission: RE | Admit: 2014-06-05 | Discharge: 2014-06-05 | Disposition: A | Discharge status: Home / Self Care | Payer: Medicaid Other | Source: Ambulatory Visit | Attending: Obstetrics & Gynecology | Admitting: Obstetrics & Gynecology

## 2014-06-05 ENCOUNTER — Encounter: Payer: Medicaid Other | Admitting: Obstetrics & Gynecology

## 2014-06-05 DIAGNOSIS — N949 Unspecified condition associated with female genital organs and menstrual cycle: Secondary | ICD-10-CM

## 2014-06-05 DIAGNOSIS — Z3689 Encounter for other specified antenatal screening: Secondary | ICD-10-CM | POA: Insufficient documentation

## 2014-06-05 DIAGNOSIS — O9989 Other specified diseases and conditions complicating pregnancy, childbirth and the puerperium: Principal | ICD-10-CM

## 2014-06-05 DIAGNOSIS — O30042 Twin pregnancy, dichorionic/diamniotic, second trimester: Secondary | ICD-10-CM

## 2014-06-05 DIAGNOSIS — O30009 Twin pregnancy, unspecified number of placenta and unspecified number of amniotic sacs, unspecified trimester: Secondary | ICD-10-CM | POA: Insufficient documentation

## 2014-06-05 DIAGNOSIS — Z3482 Encounter for supervision of other normal pregnancy, second trimester: Secondary | ICD-10-CM

## 2014-06-05 DIAGNOSIS — R109 Unspecified abdominal pain: Secondary | ICD-10-CM | POA: Diagnosis present

## 2014-06-05 DIAGNOSIS — Z1389 Encounter for screening for other disorder: Secondary | ICD-10-CM

## 2014-06-05 DIAGNOSIS — Z363 Encounter for antenatal screening for malformations: Secondary | ICD-10-CM | POA: Insufficient documentation

## 2014-06-05 DIAGNOSIS — O99891 Other specified diseases and conditions complicating pregnancy: Secondary | ICD-10-CM | POA: Insufficient documentation

## 2014-06-05 LAB — URINALYSIS, ROUTINE W REFLEX MICROSCOPIC
BILIRUBIN URINE: NEGATIVE
Glucose, UA: NEGATIVE mg/dL
Hgb urine dipstick: NEGATIVE
Ketones, ur: NEGATIVE mg/dL
NITRITE: NEGATIVE
PH: 6 (ref 5.0–8.0)
Protein, ur: NEGATIVE mg/dL
SPECIFIC GRAVITY, URINE: 1.015 (ref 1.005–1.030)
Urobilinogen, UA: 4 mg/dL — ABNORMAL HIGH (ref 0.0–1.0)

## 2014-06-05 LAB — URINE MICROSCOPIC-ADD ON

## 2014-06-05 MED ORDER — CYCLOBENZAPRINE HCL 5 MG PO TABS: 5.0000 mg | ORAL_TABLET | Freq: Two times a day (BID) | ORAL | Status: DC | PRN

## 2014-06-05 NOTE — MAU Note (Signed)
Pt presents complaining of sharp shooting pains that originate in her lower abdomen and go to her vagina. Sometimes the pain is constant. Pt took tylenol last night and this morning with no relief. Denies vaginal bleeding or discharge. Has not felt movement yet at this point in pregnancy.

## 2014-06-05 NOTE — MAU Provider Note (Signed)
History     CSN: [id number]  Arrival date and time: 06/05/14 1258   First Provider Initiated Contact with Patient 06/05/14 1334      Chief Complaint  Patient presents with  . Abdominal Pain   HPI Lindsay Ramirez is a 27 y.o. [id number] [redacted]w[redacted]d arrived at MAU complaining of abdominal pain. The pain started about a week ago and it has progressively gotten worse. She grades the pain at a 7/10 with episodes of 10/10 when she walks around. She states that the pain will cause her to stop walking and force her to bend over and sit down. She states she has tried taking tylenol for several days, including today, to alleviate her symptoms but that it has not worked. She localizes the pain to her right side and describes it as cramping pain similar to her period. She currently uses a pregnancy support belt.    OB History   Grav Para Term Preterm Abortions TAB SAB Ect Mult Living   6 4 3 1 1  0 1 0 0 4      Past Medical History  Diagnosis Date  . Sickle cell trait   . Mental disorder   . Bipolar affective disorder   . Bipolar affective disorder 2006    TOOK MEDS X 3 YR  . Depression     PP 2013  . Abnormal Pap smear     LAST PAP 06/2011  . Asthma     [facility]; [facility]  . Infection     UTI  . Anemia     CHRONIC  . Headache(784.0)     SEVERAL TIMES/DAY  . Vaginal Pap smear, abnormal     Past Surgical History  Procedure Laterality Date  . Mouth surgery    . Wisdom tooth extraction  2012  . Vaginal delivery      X 3  . Dilation and evacuation N/A 11/04/2013    Procedure: DILATATION AND EVACUATION;  Surgeon: Alwyn Pea, MD;  Location: [facility];  Service: Gynecology;  Laterality: N/A;    Family History  Problem Relation Age of Onset  . Hypertension Mother   . Asthma Mother   . Heart disease Mother   . Hyperlipidemia Mother   . Thyroid disease Mother   . Alcohol abuse Father   . Asthma Sister   . Depression Sister   . Diabetes Maternal Aunt   . HIV Maternal Aunt   .  Cancer Maternal Grandmother 37    COLON  . Depression Maternal Grandmother   . Alcohol abuse Maternal Grandmother   . Drug abuse Maternal Grandmother   . Alcohol abuse Maternal Grandfather   . Drug abuse Maternal Grandfather   . Stroke Maternal Grandfather   . Alcohol abuse Paternal Grandfather   . Drug abuse Paternal Grandfather   . Asthma Sister     History  Substance Use Topics  . Smoking status: Never Smoker   . Smokeless tobacco: Never Used  . Alcohol Use: No    Allergies: No Known Allergies  Prescriptions prior to admission  Medication Sig Dispense Refill  . butalbital-acetaminophen-caffeine (FIORICET) 50-325-40 MG per tablet Take 1-2 tablets by mouth every 6 (six) hours as needed for headache.  60 tablet  0  . Doxylamine-Pyridoxine (DICLEGIS) 10-10 MG TBEC Take 3 tablets by mouth at bedtime.      . ferrous sulfate 325 (65 FE) MG tablet Take 1 tablet (325 mg total) by mouth daily with breakfast.  30 tablet  5  .  folic acid (FOLVITE) 1 MG tablet Take 1 tablet (1 mg total) by mouth daily.  1 tablet  11  . meclizine (ANTIVERT) 25 MG tablet Take 1 tablet (25 mg total) by mouth 3 (three) times daily as needed.  60 tablet  3  . ondansetron (ZOFRAN ODT) 4 MG disintegrating tablet Take 1 tablet (4 mg total) by mouth every 6 (six) hours as needed for nausea.  20 tablet  0  . Prenatal Vit-Fe Fumarate-FA (PRENATAL MULTIVITAMIN) TABS tablet Take 1 tablet by mouth daily at 12 noon.      . sertraline (ZOLOFT) 50 MG tablet Take 1 tablet (50 mg total) by mouth daily.  30 tablet  6  . traZODone (DESYREL) 50 MG tablet Take 1 tablet (50 mg total) by mouth at bedtime.  30 tablet  3   Results for orders placed during the hospital encounter of 06/05/14 (from the past 24 hour(s))  URINALYSIS, ROUTINE W REFLEX MICROSCOPIC     Status: Abnormal   Collection Time    06/05/14  1:05 PM      Result Value Ref Range   Color, Urine YELLOW  YELLOW   APPearance HAZY (*) CLEAR   Specific Gravity, Urine  1.015  1.005 - 1.030   pH 6.0  5.0 - 8.0   Glucose, UA NEGATIVE  NEGATIVE mg/dL   Hgb urine dipstick NEGATIVE  NEGATIVE   Bilirubin Urine NEGATIVE  NEGATIVE   Ketones, ur NEGATIVE  NEGATIVE mg/dL   Protein, ur NEGATIVE  NEGATIVE mg/dL   Urobilinogen, UA 4.0 (*) 0.0 - 1.0 mg/dL   Nitrite NEGATIVE  NEGATIVE   Leukocytes, UA SMALL (*) NEGATIVE  URINE MICROSCOPIC-ADD ON     Status: Abnormal   Collection Time    06/05/14  1:05 PM      Result Value Ref Range   Squamous Epithelial / LPF MANY (*) RARE   WBC, UA 0-2  <3 WBC/hpf   Bacteria, UA FEW (*) RARE     Review of Systems  Constitutional: Positive for chills and malaise/fatigue. Negative for fever and diaphoresis.  HENT: Positive for congestion. Negative for nosebleeds and sore throat.   Eyes: Negative for blurred vision.  Respiratory: Positive for shortness of breath. Negative for cough.   Cardiovascular: Positive for chest pain. Negative for palpitations.  Gastrointestinal: Positive for nausea and abdominal pain. Negative for vomiting, diarrhea and constipation.  Genitourinary: Positive for urgency and frequency. Negative for dysuria and hematuria.  Skin: Negative for rash.  Neurological: Positive for headaches. Negative for dizziness.  Psychiatric/Behavioral: Negative for depression, suicidal ideas and substance abuse. The patient is not nervous/anxious.    Physical Exam   Blood pressure 111/50, pulse 79, temperature 98.3 F (36.8 C), temperature source Oral, resp. rate 18, not currently breastfeeding.  Physical Exam  Constitutional: She is oriented to person, place, and time. She appears well-developed and well-nourished. No distress.  HENT:  Head: Normocephalic.  Neck: Normal range of motion.  Cardiovascular: Normal rate and regular rhythm.   Murmur (S1) heard. Respiratory: Effort normal and breath sounds normal.  GI: Soft. Bowel sounds are normal. There is tenderness in the right lower quadrant, suprapubic area and left  lower quadrant. There is no rigidity, no rebound and no guarding.  Genitourinary:  Cervical exam: Closed, thick, posterior. No CMT   Neurological: She is alert and oriented to person, place, and time.  Skin: Skin is warm and dry.    MAU Course  Procedures None  MDM Patient stable during MAU  stay Urine culture  GC- urine- pending   Assessment and Plan  Assessment: Round ligament pain in pregnancy  Plan:  DC to home  RX Flexeril 5mg  BID PRN for ligament pain  Encouraged hydration  Educated on round ligament pain   Continue pregnancy support belt  Follow up with Dr. Jodi Mourning as scheduled.   Mariana Arn 06/05/2014, 1:36 PM   Evaluation and management procedures were performed by the PA student under my supervision and collaboration. I have reviewed the note and chart, and I agree with the management and plan.  Darrelyn Hillock Wania Longstreth, NP 06/05/2014 2:15 PM

## 2014-06-06 LAB — CULTURE, OB URINE
Colony Count: NO GROWTH
Culture: NO GROWTH
SPECIAL REQUESTS: NORMAL

## 2014-06-06 LAB — GC/CHLAMYDIA PROBE AMP
CT Probe RNA: NEGATIVE
GC Probe RNA: NEGATIVE

## 2014-06-08 ENCOUNTER — Encounter: Payer: Self-pay | Admitting: Obstetrics & Gynecology

## 2014-06-08 ENCOUNTER — Ambulatory Visit (INDEPENDENT_AMBULATORY_CARE_PROVIDER_SITE_OTHER): Payer: Medicaid Other | Admitting: Obstetrics & Gynecology

## 2014-06-08 VITALS — BP 97/61 | HR 74 | Temp 98.1°F | Wt 199.0 lb

## 2014-06-08 DIAGNOSIS — Z3402 Encounter for supervision of normal first pregnancy, second trimester: Secondary | ICD-10-CM

## 2014-06-08 DIAGNOSIS — O30002 Twin pregnancy, unspecified number of placenta and unspecified number of amniotic sacs, second trimester: Secondary | ICD-10-CM

## 2014-06-08 LAB — POCT URINALYSIS DIPSTICK
BILIRUBIN UA: 2
Bilirubin, UA: NEGATIVE
Blood, UA: NEGATIVE
GLUCOSE UA: NEGATIVE
Glucose, UA: NEGATIVE
Ketones, UA: NEGATIVE
Leukocytes, UA: NEGATIVE
NITRITE UA: NEGATIVE
Nitrite, UA: NEGATIVE
PH UA: 6
RBC UA: NEGATIVE
SPEC GRAV UA: 1.01
Spec Grav, UA: 1.025
UROBILINOGEN UA: NEGATIVE
Urobilinogen, UA: 0.2
pH, UA: 5.5

## 2014-06-09 NOTE — Patient Instructions (Signed)
Glucose Tolerance Test This is a test to see how your body processes carbohydrates. This test is often done to check patients for diabetes or the possibility of developing it. PREPARATION FOR TEST You should have nothing to eat or drink 12 hours before the test. You will be given a form of sugar (glucose) and then blood samples will be drawn from your vein to determine the level of sugar in your blood. Alternatively, blood may be drawn from your finger for testing. You should not smoke or exercise during the test. NORMAL FINDINGS  Fasting: 70-115 mg/dL  30 minutes: less than 200 mg/dL  1 hour: less than 200 mg/dL  2 hours: less than 140 mg/dL  3 hours: 70-115 mg/dL  4 hours: 70-115 mg/dL Ranges for normal findings may vary among different laboratories and hospitals. You should always check with your doctor after having lab work or other tests done to discuss the meaning of your test results and whether your values are considered within normal limits. MEANING OF TEST Your caregiver will go over the test results with you and discuss the importance and meaning of your results, as well as treatment options and the need for additional tests. OBTAINING THE TEST RESULTS It is your responsibility to obtain your test results. Ask the lab or department performing the test when and how you will get your results. Document Released: 09/17/2004 Document Revised: 11/17/2011 Document Reviewed: 12/30/2013 [facility] Patient Information 2015 [place], [place]. This information is not intended to replace advice given to you by your health care provider. Make sure you discuss any questions you have with your health care provider.

## 2014-06-09 NOTE — Progress Notes (Signed)
Subjective:    Lindsay Ramirez is being seen today for her obstetrical visit.  She is at [redacted]w[redacted]d gestation.  Patient reports no complaints.   Fetal Movement: normal.   Problem List Items Addressed This Visit   None    Visit Diagnoses   Encounter for supervision of normal first pregnancy in second trimester    -  Primary    Relevant Orders       POCT urinalysis dipstick (Completed)    Twin gestation in second trimester, unspecified placenta and amniotic sac number        Relevant Orders       US OB Follow Up      Patient Active Problem List   Diagnosis Date Noted  . GBS (group B streptococcus) UTI complicating pregnancy 20/94/7096    Priority: High  . Twin gestation, dichorionic diamniotic 05/04/2014    Priority: High  . Unspecified high-risk pregnancy 04/27/2014    Priority: High  . Bipolar disorder, unspecified/depression 11/11/2012    Priority: High  . 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 Hyperemesis affecting pregnancy, antepartum 05/09/2014  . Nausea/vomiting in pregnancy 04/30/2014  . Migraine without aura 03/25/2014  . SAB (spontaneous abortion), with D&E 11/04/13 11/13/2013  . Endometritis s/p D&E for SAB 11/13/2013  . 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 Gout 06/15/2013  . Vitamin D deficiency 07/26/2012  . Acid reflux 06/21/2012  . BV (bacterial vaginosis) 06/21/2012    Objective:    BP 97/61  Pulse 74  Temp(Src) 98.1 F (36.7 C)  Wt 90.266 kg (199 lb) FHT:  Baby A: 160 BPM;  Baby B:  160 BPM  Uterine Size: size equals dates     Assessment:    Pregnancy @ [redacted]w[redacted]d . Twins, diamniotic, dichorionic.   Plan:    fetal survey results reviewed. Follow-up: 4 weeks.

## 2014-06-12 ENCOUNTER — Encounter: Payer: Self-pay | Admitting: Obstetrics & Gynecology

## 2014-06-14 ENCOUNTER — Institutional Professional Consult (permissible substitution): Payer: Medicaid Other

## 2014-06-19 ENCOUNTER — Telehealth: Payer: Self-pay | Admitting: *Deleted

## 2014-06-19 ENCOUNTER — Encounter (HOSPITAL_COMMUNITY): Payer: Self-pay

## 2014-06-19 ENCOUNTER — Inpatient Hospital Stay (HOSPITAL_COMMUNITY)
Admission: AD | Admit: 2014-06-19 | Discharge: 2014-06-19 | Disposition: A | Discharge status: Home / Self Care | Payer: Medicaid Other | Source: Ambulatory Visit | Attending: Obstetrics & Gynecology | Admitting: Obstetrics & Gynecology

## 2014-06-19 DIAGNOSIS — O26899 Other specified pregnancy related conditions, unspecified trimester: Secondary | ICD-10-CM

## 2014-06-19 DIAGNOSIS — O99342 Other mental disorders complicating pregnancy, second trimester: Secondary | ICD-10-CM | POA: Insufficient documentation

## 2014-06-19 DIAGNOSIS — R110 Nausea: Secondary | ICD-10-CM

## 2014-06-19 DIAGNOSIS — F329 Major depressive disorder, single episode, unspecified: Secondary | ICD-10-CM

## 2014-06-19 DIAGNOSIS — O30002 Twin pregnancy, unspecified number of placenta and unspecified number of amniotic sacs, second trimester: Secondary | ICD-10-CM

## 2014-06-19 DIAGNOSIS — Z3A23 23 weeks gestation of pregnancy: Secondary | ICD-10-CM | POA: Diagnosis not present

## 2014-06-19 DIAGNOSIS — O368120 Decreased fetal movements, second trimester, not applicable or unspecified: Secondary | ICD-10-CM | POA: Diagnosis not present

## 2014-06-19 DIAGNOSIS — O9989 Other specified diseases and conditions complicating pregnancy, childbirth and the puerperium: Secondary | ICD-10-CM

## 2014-06-19 DIAGNOSIS — O321XX2 Maternal care for breech presentation, fetus 2: Secondary | ICD-10-CM | POA: Insufficient documentation

## 2014-06-19 DIAGNOSIS — R1030 Lower abdominal pain, unspecified: Secondary | ICD-10-CM | POA: Insufficient documentation

## 2014-06-19 DIAGNOSIS — R109 Unspecified abdominal pain: Secondary | ICD-10-CM

## 2014-06-19 DIAGNOSIS — O368129 Decreased fetal movements, second trimester, other fetus: Secondary | ICD-10-CM

## 2014-06-19 DIAGNOSIS — O318X22 Other complications specific to multiple gestation, second trimester, fetus 2: Secondary | ICD-10-CM | POA: Diagnosis not present

## 2014-06-19 LAB — CBC WITH DIFFERENTIAL/PLATELET
BASOS PCT: 0 % (ref 0–1)
Basophils Absolute: 0 10*3/uL (ref 0.0–0.1)
EOS PCT: 2 % (ref 0–5)
Eosinophils Absolute: 0.2 10*3/uL (ref 0.0–0.7)
HCT: 28.6 % — ABNORMAL LOW (ref 36.0–46.0)
Hemoglobin: 9.6 g/dL — ABNORMAL LOW (ref 12.0–15.0)
LYMPHS PCT: 18 % (ref 12–46)
Lymphs Abs: 2.1 10*3/uL (ref 0.7–4.0)
MCH: 26.8 pg (ref 26.0–34.0)
MCHC: 33.6 g/dL (ref 30.0–36.0)
MCV: 79.9 fL (ref 78.0–100.0)
Monocytes Absolute: 1.3 10*3/uL — ABNORMAL HIGH (ref 0.1–1.0)
Monocytes Relative: 11 % (ref 3–12)
Neutro Abs: 7.7 10*3/uL (ref 1.7–7.7)
Neutrophils Relative %: 69 % (ref 43–77)
PLATELETS: 211 10*3/uL (ref 150–400)
RBC: 3.58 MIL/uL — ABNORMAL LOW (ref 3.87–5.11)
RDW: 13.5 % (ref 11.5–15.5)
WBC: 11.3 10*3/uL — AB (ref 4.0–10.5)

## 2014-06-19 LAB — COMPREHENSIVE METABOLIC PANEL
ALBUMIN: 2.6 g/dL — AB (ref 3.5–5.2)
ALT: 5 U/L (ref 0–35)
AST: 10 U/L (ref 0–37)
Alkaline Phosphatase: 78 U/L (ref 39–117)
Anion gap: 12 (ref 5–15)
BUN: 3 mg/dL — ABNORMAL LOW (ref 6–23)
CALCIUM: 8.6 mg/dL (ref 8.4–10.5)
CO2: 21 meq/L (ref 19–32)
CREATININE: 0.54 mg/dL (ref 0.50–1.10)
Chloride: 105 mEq/L (ref 96–112)
GFR calc Af Amer: >90 mL/min (ref >90)
Glucose, Bld: 88 mg/dL (ref 70–99)
Potassium: 3 mEq/L — ABNORMAL LOW (ref 3.7–5.3)
SODIUM: 138 meq/L (ref 137–147)
TOTAL PROTEIN: 6.1 g/dL (ref 6.0–8.3)
Total Bilirubin: 0.3 mg/dL (ref 0.3–1.2)

## 2014-06-19 LAB — URINALYSIS, ROUTINE W REFLEX MICROSCOPIC
Bilirubin Urine: NEGATIVE
Glucose, UA: NEGATIVE mg/dL
Hgb urine dipstick: NEGATIVE
KETONES UR: 15 mg/dL — AB
LEUKOCYTES UA: NEGATIVE
Nitrite: NEGATIVE
PROTEIN: NEGATIVE mg/dL
Specific Gravity, Urine: 1.015 (ref 1.005–1.030)
Urobilinogen, UA: 2 mg/dL — ABNORMAL HIGH (ref 0.0–1.0)
pH: 6.5 (ref 5.0–8.0)
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
## 2014-06-19 NOTE — Discharge Instructions (Signed)
Abdominal Pain During Pregnancy Abdominal pain is common in pregnancy. Most of the time, it does not cause harm. There are many causes of abdominal pain. Some causes are more serious than others. Some of the causes of abdominal pain in pregnancy are easily diagnosed. Occasionally, the diagnosis takes time to understand. Other times, the cause is not determined. Abdominal pain can be a sign that something is very wrong with the pregnancy, or the pain may have nothing to do with the pregnancy at all. For this reason, always tell your health care provider if you have any abdominal discomfort. HOME CARE INSTRUCTIONS  Monitor your abdominal pain for any changes. The following actions may help to alleviate any discomfort you are experiencing:  Do not have sexual intercourse or put anything in your vagina until your symptoms go away completely.  Get plenty of rest until your pain improves.  Drink clear fluids if you feel nauseous. Avoid solid food as long as you are uncomfortable or nauseous.  Only take over-the-counter or prescription medicine as directed by your health care provider.  Keep all follow-up appointments with your health care provider. SEEK IMMEDIATE MEDICAL CARE IF:  You are bleeding, leaking fluid, or passing tissue from the vagina.  You have increasing pain or cramping.  You have persistent vomiting.  You have painful or bloody urination.  You have a fever.  You notice a decrease in your baby's movements.  You have extreme weakness or feel faint.  You have shortness of breath, with or without abdominal pain.  You develop a severe headache with abdominal pain.  You have abnormal vaginal discharge with abdominal pain.  You have persistent diarrhea.  You have abdominal pain that continues even after rest, or gets worse. MAKE SURE YOU:   Understand these instructions.  Will watch your condition.  Will get help right away if you are not doing well or get  worse. Document Released: 08/25/2005 Document Revised: 06/15/2013 Document Reviewed: 03/24/2013 [facility] Patient Information 2015 [place], [place]. This information is not intended to replace advice given to you by your health care provider. Make sure you discuss any questions you have with your health care provider.  If You Are the Victim of Domestic Violence THE POLICE CAN HELP YOU:  Get to a safe place away from the violence.  Get information on how the court can help protect you against the violence.  Get medical care for injuries you or your children may have.  Get necessary belongings from your home for you and your children.  Get copies of police reports about the violence.  File a complaint in criminal court.  Find where local criminal and family courts are located. THE COURTS CAN HELP YOU  If the person who harmed or threatened you is a family member or someone you have had a child with, then you have the right to take your case to the criminal courts, the [employer], or both.  If you and the abuser are not related, were not ever married, and do not have a child in common, then your case can be heard only in the criminal court.  The forms you need are available from the [facility] and the criminal court.  The courts can decide to provide a temporary order of protection for:  You.  Your children.  Any witnesses who may request one.  The [employer] may appoint a lawyer to help you in court if it is found that you cannot afford one.  The  Family Court may order temporary child support and temporary custody of your children. LAWS VARY FROM STATE TO STATE. YOU WILL NEED TO CHECK THE LAWS IN YOUR STATE.  You may request that the law enforcement officer assist in:  Providing for your safety and that of your children. This includes providing information on how to obtain a temporary order of protection.  Obtaining essential personal property.  Locating and taking  you and your children to a safe place within the officer's jurisdiction. This includes but is not limited to a domestic violence program, a family member's or a friend's residence, or a similar place of safety.  Obtaining medical treatment for you and your children.  When the officer's jurisdiction is more than a single county, you may ask the officer to take you or make arrangements to take you and your children to a place of safety in the county where the incident occurred.  You may request a copy of any incident reports at no cost from the law enforcement agency.  You have the right to seek legal counsel of your own choosing. If you proceed in family court and if it is determined that you cannot afford an attorney one must be appointed to represent you without cost to you.  You may ask the district attorney or a Curator to file a criminal complaint. You also have the right to have your petition and request for an order of protection filed on the same day you appear in court. Such request must be heard that same day or the next day court is in session.  Either court may issue an order of protection from conduct constituting a family offense. This could include an order for the respondent or defendant to stay away from you and your children.  If the family court is not in session, you may seek immediate assistance from the criminal court in obtaining an order of protection. The forms you need to obtain an order of protection are available from the family court and the local criminal court. Note that filing a criminal complaint or a family court petition containing allegations (claims) that are knowingly false is a crime. Call your local domestic violence program for additional information and support. Document Released: 11/15/2003 Document Revised: 11/17/2011 Document Reviewed: 07/05/2007 [facility] Patient Information 2015 [place], [place]. This information is not intended to replace  advice given to you by your health care provider. Make sure you discuss any questions you have with your health care provider. Suicidal Feelings, How to Help Yourself Everyone feels sad or unhappy at times, but depressing thoughts and feelings of hopelessness can lead to thoughts of suicide. It can seem as if life is too tough to handle. If you feel as though you have reached the point where suicide is the only answer, it is time to let someone know immediately.  HOW TO COPE AND PREVENT SUICIDE  Let family, friends, teachers, or counselors know. Get help. Try not to isolate yourself from those who care about you. Even though you may not feel sociable, talk with someone every day. It is best if it is face-to-face. Remember, they will want to help you.  Eat a regularly spaced and well-balanced diet.  Get plenty of rest.  Avoid alcohol and drugs because they will only make you feel worse and may also lower your inhibitions. Remove them from the home. If you are thinking of taking an overdose of your prescribed medicines, give your medicines to someone who  can give them to you one day at a time. If you are on antidepressants, let your caregiver know of your feelings so he or she can provide a safer medicine, if that is a concern.  Remove weapons or poisons from your home.  Try to stick to routines. Follow a schedule and remind yourself that you have to keep that schedule every day.  Set some realistic goals and achieve them. Make a list and cross things off as you go. Accomplishments give a sense of worth. Wait until you are feeling better before doing things you find difficult or unpleasant to do.  If you are able, try to start exercising. Even half-hour periods of exercise each day will make you feel better. Getting out in the sun or into nature helps you recover from depression faster. If you have a favorite place to walk, take advantage of that.  Increase safe activities that have always given  you pleasure. This may include playing your favorite music, reading a good book, painting a picture, or playing your favorite instrument. Do whatever takes your mind off your depression.  Keep your living space well-lighted. GET HELP Contact a suicide hotline, crisis center, or local suicide prevention center for help right away. Local centers may include a hospital, clinic, community service organization, social service provider, or health department.  Call your local emergency services (911 in the [place]).  Call a suicide hotline:  1-800-273-TALK (1-(321)285-1892) in the [place].  1-800-SUICIDE (317) 700-5594) in the [place].  269-759-1640 in the [place] for Spanish-speaking counselors.  2-836-629-4TML 458 274 7714) in the [place] for TTY users.  Visit the following websites for information and help:  National Suicide Prevention Lifeline: www.suicidepreventionlifeline.org  Hopeline: www.hopeline.[place] for Suicide Prevention: PromotionalLoans.co.za  For lesbian, gay, bisexual, transgender, or questioning youth, contact [employer]:  1-275-1-Z-GYFVCB (917)013-6326) in the [place].  www.thetrevorproject.org  In [place], treatment resources are listed in each [place] with listings available under [employer] for [employer] or similar titles. Another source for Crisis Centres by [place] is located at http://www.suicideprevention.ca/in-crisis-now/find-a-crisis-centre-now/crisis-centres Document Released: 03/01/2003 Document Revised: 11/17/2011 Document Reviewed: 12/20/2013 [facility] Patient Information 2015 [place], [place]. This information is not intended to replace advice given to you by your health care provider. Make sure you discuss any questions you have with your health care provider.
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
## 2014-06-19 NOTE — Telephone Encounter (Signed)
Patient called concerned that she hasn't felt movement in 2 days and she is having some abdominal pain. 5:24 Spoke with patient and she states she has not felt anything for 2 days- she has tried all of the recommended things like eating and drinking without success. Advised patient to go to MAU.

## 2014-06-19 NOTE — MAU Note (Signed)
Pt does not want to wait for psychiatric consult and will do that at her own physicians office. Hansel Feinstein CNM aware and discharge notes made.

## 2014-06-19 NOTE — MAU Note (Signed)
E-sign not working.

## 2014-06-19 NOTE — MAU Note (Signed)
Bedside ultrasound performed by Carmelia Roller CNM for presentation of twins.Twins were seen along with hearts beating.

## 2014-06-19 NOTE — MAU Note (Signed)
Pt states here for nausea and coughing. Vomited once yesterday only, none today. Denies bleeding or vag d/c changes. Also has abdominal pain in upper and lower abdomen.

## 2014-06-21 ENCOUNTER — Institutional Professional Consult (permissible substitution): Payer: Medicaid Other

## 2014-06-21 ENCOUNTER — Telehealth: Payer: Self-pay

## 2014-06-21 ENCOUNTER — Other Ambulatory Visit: Payer: Medicaid Other

## 2014-06-21 ENCOUNTER — Encounter: Payer: Medicaid Other | Admitting: Obstetrics & Gynecology

## 2014-06-21 NOTE — Telephone Encounter (Signed)
PATIENT CANCELLED APPTS FOR MFM AND PSYCH EVAL - STATED TRANSPORTATION ISSUES - STATED WOULD CALL us BACK WHEN SHE CAN RESCHEDULE THOSE APPTS - [place] [facility] [facility] [place]

## 2014-06-22 ENCOUNTER — Encounter: Payer: Medicaid Other | Admitting: Obstetrics & Gynecology

## 2014-06-22 ENCOUNTER — Institutional Professional Consult (permissible substitution): Payer: Medicaid Other

## 2014-06-22 ENCOUNTER — Other Ambulatory Visit: Payer: Medicaid Other

## 2014-06-23 ENCOUNTER — Institutional Professional Consult (permissible substitution): Payer: Medicaid Other

## 2014-07-06 ENCOUNTER — Other Ambulatory Visit: Payer: Medicaid Other

## 2014-07-06 ENCOUNTER — Ambulatory Visit (HOSPITAL_COMMUNITY)
Admission: RE | Admit: 2014-07-06 | Discharge: 2014-07-06 | Disposition: A | Discharge status: Home / Self Care | Payer: Medicaid Other | Source: Ambulatory Visit | Attending: Obstetrics & Gynecology | Admitting: Obstetrics & Gynecology

## 2014-07-06 ENCOUNTER — Other Ambulatory Visit: Payer: Self-pay | Admitting: Obstetrics & Gynecology

## 2014-07-06 ENCOUNTER — Encounter (HOSPITAL_COMMUNITY): Payer: Self-pay | Admitting: *Deleted

## 2014-07-06 ENCOUNTER — Inpatient Hospital Stay (HOSPITAL_COMMUNITY)
Admission: AD | Admit: 2014-07-06 | Discharge: 2014-07-06 | Disposition: A | Discharge status: Home / Self Care | Payer: Medicaid Other | Source: Ambulatory Visit | Attending: Obstetrics | Admitting: Obstetrics

## 2014-07-06 ENCOUNTER — Ambulatory Visit (INDEPENDENT_AMBULATORY_CARE_PROVIDER_SITE_OTHER): Payer: Medicaid Other | Admitting: Obstetrics & Gynecology

## 2014-07-06 ENCOUNTER — Encounter: Payer: Self-pay | Admitting: Obstetrics & Gynecology

## 2014-07-06 VITALS — BP 115/65 | HR 72 | Temp 97.9°F | Wt 200.0 lb

## 2014-07-06 DIAGNOSIS — O30002 Twin pregnancy, unspecified number of placenta and unspecified number of amniotic sacs, second trimester: Secondary | ICD-10-CM

## 2014-07-06 DIAGNOSIS — O30009 Twin pregnancy, unspecified number of placenta and unspecified number of amniotic sacs, unspecified trimester: Secondary | ICD-10-CM | POA: Diagnosis present

## 2014-07-06 DIAGNOSIS — O30042 Twin pregnancy, dichorionic/diamniotic, second trimester: Secondary | ICD-10-CM

## 2014-07-06 DIAGNOSIS — O26899 Other specified pregnancy related conditions, unspecified trimester: Secondary | ICD-10-CM

## 2014-07-06 DIAGNOSIS — Z3A00 Weeks of gestation of pregnancy not specified: Secondary | ICD-10-CM | POA: Diagnosis not present

## 2014-07-06 DIAGNOSIS — O26892 Other specified pregnancy related conditions, second trimester: Secondary | ICD-10-CM | POA: Insufficient documentation

## 2014-07-06 DIAGNOSIS — R109 Unspecified abdominal pain: Secondary | ICD-10-CM

## 2014-07-06 DIAGNOSIS — Z3A25 25 weeks gestation of pregnancy: Secondary | ICD-10-CM | POA: Insufficient documentation

## 2014-07-06 DIAGNOSIS — R1030 Lower abdominal pain, unspecified: Secondary | ICD-10-CM | POA: Diagnosis not present

## 2014-07-06 DIAGNOSIS — O0992 Supervision of high risk pregnancy, unspecified, second trimester: Secondary | ICD-10-CM

## 2014-07-06 DIAGNOSIS — Z3490 Encounter for supervision of normal pregnancy, unspecified, unspecified trimester: Secondary | ICD-10-CM

## 2014-07-06 LAB — URINALYSIS, ROUTINE W REFLEX MICROSCOPIC
Bilirubin Urine: NEGATIVE
Glucose, UA: NEGATIVE mg/dL
HGB URINE DIPSTICK: NEGATIVE
Ketones, ur: NEGATIVE mg/dL
LEUKOCYTES UA: NEGATIVE
NITRITE: NEGATIVE
PH: 6.5 (ref 5.0–8.0)
Protein, ur: NEGATIVE mg/dL
SPECIFIC GRAVITY, URINE: 1.01 (ref 1.005–1.030)
Urobilinogen, UA: 0.2 mg/dL (ref 0.0–1.0)

## 2014-07-06 LAB — POCT URINALYSIS DIPSTICK
Bilirubin, UA: NEGATIVE
Blood, UA: NEGATIVE
Glucose, UA: NEGATIVE
Ketones, UA: NEGATIVE
Leukocytes, UA: NEGATIVE
Nitrite, UA: NEGATIVE
PROTEIN UA: NEGATIVE
SPEC GRAV UA: 1.01
UROBILINOGEN UA: NEGATIVE
pH, UA: 7

## 2014-07-06 LAB — FETAL FIBRONECTIN: FETAL FIBRONECTIN: POSITIVE — AB

## 2014-07-06 NOTE — Progress Notes (Signed)
Subjective:    Lindsay Ramirez is being seen today for her obstetrical visit.  She is at [redacted]w[redacted]d gestation.  Patient reports occasional contractions.   Fetal Movement: normal.   Problem List Items Addressed This Visit   None    Visit Diagnoses   Supervision of high risk pregnancy in second trimester    -  Primary    Relevant Orders       Glucose Tolerance, 2 Hours w/1 Hour       CBC       HIV antibody       RPR       POCT urinalysis dipstick       WET PREP BY MOLECULAR PROBE       GC/Chlamydia Probe Amp       Strep B DNA probe       Fetal fibronectin      Patient Active Problem List   Diagnosis Date Noted  . GBS (group B streptococcus) UTI complicating pregnancy 25/01/3975    Priority: High  . Twin gestation, dichorionic diamniotic 05/04/2014    Priority: High  . Unspecified high-risk pregnancy 04/27/2014    Priority: High  . Bipolar disorder, unspecified/depression 11/11/2012    Priority: High  . 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 Hyperemesis affecting pregnancy, antepartum 05/09/2014  . Nausea/vomiting in pregnancy 04/30/2014  . Migraine without aura 03/25/2014  . SAB (spontaneous abortion), with D&E 11/04/13 11/13/2013  . Endometritis s/p D&E for SAB 11/13/2013  . 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 Gout 06/15/2013  . Vitamin D deficiency 07/26/2012  . Acid reflux 06/21/2012  . BV (bacterial vaginosis) 06/21/2012   Objective:    BP 115/65  Pulse 72  Temp(Src) 97.9 F (36.6 C)  Wt 90.719 kg (200 lb) FHT:  Baby A: 160 BPM;  Baby B:  160 BPM  Uterine Size: size equals dates     Assessment:    Pregnancy @ [redacted]w[redacted]d weeks Twins, dichorionic, diamniotic.  ?Threatened PTL Plan:   Orders Placed This Encounter  Procedures  . WET PREP BY MOLECULAR PROBE  . GC/Chlamydia Probe Amp  . Strep B DNA probe  . Glucose Tolerance, 2 Hours w/1 Hour  . CBC  . HIV antibody  . RPR  . Fetal fibronectin   . POCT urinalysis dipstick   May consider po tocolysis in MAU  Ultrasound scheduled for today.    Signs of premature labor and dilation were reviewed.   Discussed fetal positions and related modes of delivery.   Follow up: 1 week.

## 2014-07-06 NOTE — Discharge Instructions (Signed)
Abdominal Pain During Pregnancy Belly (abdominal) pain is common during pregnancy. Most of the time, it is not a serious problem. Other times, it can be a sign that something is wrong with the pregnancy. Always tell your doctor if you have belly pain. HOME CARE Monitor your belly pain for any changes. The following actions may help you feel better:  Do not have sex (intercourse) or put anything in your vagina until you feel better.  Rest until your pain stops.  Drink clear fluids if you feel sick to your stomach (nauseous). Do not eat solid food until you feel better.  Only take medicine as told by your doctor.  Keep all doctor visits as told. GET HELP RIGHT AWAY IF:   You are bleeding, leaking fluid, or pieces of tissue come out of your vagina.  You have more pain or cramping.  You keep throwing up (vomiting).  You have pain when you pee (urinate) or have blood in your pee.  You have a fever.  You do not feel your baby moving as much.  You feel very weak or feel like passing out.  You have trouble breathing, with or without belly pain.  You have a very bad headache and belly pain.  You have fluid leaking from your vagina and belly pain.  You keep having watery poop (diarrhea).  Your belly pain does not go away after resting, or the pain gets worse. MAKE SURE YOU:   Understand these instructions.  Will watch your condition.  Will get help right away if you are not doing well or get worse. Document Released: 08/13/2009 Document Revised: 04/27/2013 Document Reviewed: 03/24/2013 [facility] Patient Information 2015 [place], [place]. This information is not intended to replace advice given to you by your health care provider. Make sure you discuss any questions you have with your health care provider. Preterm Labor Information Preterm labor is when labor starts before you are [redacted] weeks pregnant. The normal length of pregnancy is 39 to 41 weeks.  CAUSES  The cause of preterm  labor is not often known. The most common known cause is infection. RISK FACTORS  Having a history of preterm labor.  Having your water break before it should.  Having a placenta that covers the opening of the cervix.  Having a placenta that breaks away from the uterus.  Having a cervix that is too weak to hold the baby in the uterus.  Having too much fluid in the amniotic sac.  Taking drugs or smoking while pregnant.  Not gaining enough weight while pregnant.  Being younger than 64 and older than 27 years old.  Having a low income.  Being African American. SYMPTOMS  Period-like cramps, belly (abdominal) pain, or back pain.  Contractions that are regular, as often as six in an hour. They may be mild or painful.  Contractions that start at the top of the belly. They then move to the lower belly and back.  Lower belly pressure that seems to get stronger.  Bleeding from the vagina.  Fluid leaking from the vagina. TREATMENT  Treatment depends on:  Your condition.  The condition of your baby.  How many weeks pregnant you are. Your doctor may have you:  Take medicine to stop contractions.  Stay in bed except to use the restroom (bed rest).  Stay in the hospital. WHAT SHOULD YOU DO IF YOU THINK YOU ARE IN PRETERM LABOR? Call your doctor right away. You need to go to the hospital right away.  HOW CAN YOU PREVENT PRETERM LABOR IN FUTURE PREGNANCIES?  Stop smoking, if you smoke.  Maintain healthy weight gain.  Do not take drugs or be around chemicals that are not needed.  Tell your doctor if you think you have an infection.  Tell your doctor if you had a preterm labor before. Document Released: 11/21/2008 Document Revised: 06/15/2013 Document Reviewed: 11/21/2008 [facility] Patient Information 2015 [place], [place]. This information is not intended to replace advice given to you by your health care provider. Make sure you discuss any questions you have with your  health care provider.

## 2014-07-06 NOTE — MAU Note (Signed)
Pt had positive FFN in office and ctx. Sent from office to be further evaluated. Pt is c/o cramping on and off

## 2014-07-06 NOTE — Patient Instructions (Signed)

## 2014-07-06 NOTE — Addendum Note (Signed)
Addended by: Lewie Loron D on: 07/06/2014 12:06 PM   Modules accepted: Orders

## 2014-07-06 NOTE — MAU Provider Note (Signed)
History     CSN: [id number]  Arrival date and time: 07/06/14 1606   First Provider Initiated Contact with Patient 07/06/14 1702      Chief Complaint  Patient presents with  . Contractions   HPI [name] is a 28 y.o. [id number] at [redacted]w[redacted]d with twins who presents to MAU today from the office. The patient was seen by [facility] this morning and states that she has had lower abdominal cramping. She denies contractions, vaginal bleeding or LOF. FFN was collected in the office and was positive. Patient was sent to MAU for fetal monitoring and possible tocolysis. Cervical exam was fingertip in the office per patient. Patient reports good fetal movement.   OB History   Grav Para Term Preterm Abortions TAB SAB Ect Mult Living   6 4 3 1 1  0 1 0 0 4      Past Medical History  Diagnosis Date  . Sickle cell trait   . Mental disorder   . Bipolar affective disorder   . Bipolar affective disorder 2006    TOOK MEDS X 3 YR  . Depression     PP 2013  . Abnormal Pap smear     LAST PAP 06/2011  . Asthma     [facility]HAL[facility]; [facility]  . Infection     UTI  . Anemia     CHRONIC  . Headache(784.0)     SEVERAL TIMES/DAY  . Vaginal Pap smear, abnormal     Past Surgical History  Procedure Laterality Date  . Mouth surgery    . Wisdom tooth extraction  2012  . Vaginal delivery      X 3  . Dilation and evacuation N/A 11/04/2013    Procedure: DILATATION AND EVACUATION;  Surgeon: Alwyn Pea, MD;  Location: [facility];  Service: Gynecology;  Laterality: N/A;    Family History  Problem Relation Age of Onset  . Hypertension Mother   . Asthma Mother   . Heart disease Mother   . Hyperlipidemia Mother   . Thyroid disease Mother   . Alcohol abuse Father   . Asthma Sister   . Depression Sister   . Diabetes Maternal Aunt   . HIV Maternal Aunt   . Cancer Maternal Grandmother 37    COLON  . Depression Maternal Grandmother   . Alcohol abuse Maternal Grandmother   . Drug abuse Maternal  Grandmother   . Alcohol abuse Maternal Grandfather   . Drug abuse Maternal Grandfather   . Stroke Maternal Grandfather   . Alcohol abuse Paternal Grandfather   . Drug abuse Paternal Grandfather   . Asthma Sister     History  Substance Use Topics  . Smoking status: Never Smoker   . Smokeless tobacco: Never Used  . Alcohol Use: No    Allergies: No Known Allergies  Prescriptions prior to admission  Medication Sig Dispense Refill  . cyclobenzaprine (FLEXERIL) 5 MG tablet Take 1 tablet (5 mg total) by mouth 2 (two) times daily as needed for muscle spasms.  20 tablet  0  . Doxylamine-Pyridoxine (DICLEGIS) 10-10 MG TBEC Take 3 tablets by mouth at bedtime.      . ferrous sulfate 325 (65 FE) MG tablet Take 1 tablet (325 mg total) by mouth daily with breakfast.  30 tablet  5  . folic acid (FOLVITE) 1 MG tablet Take 1 tablet (1 mg total) by mouth daily.  1 tablet  11  . ondansetron (ZOFRAN ODT) 4 MG disintegrating tablet Take  1 tablet (4 mg total) by mouth every 6 (six) hours as needed for nausea.  20 tablet  0  . Prenatal Vit-Fe Fumarate-FA (PRENATAL MULTIVITAMIN) TABS tablet Take 1 tablet by mouth daily.       . sertraline (ZOLOFT) 50 MG tablet Take 1 tablet (50 mg total) by mouth daily.  30 tablet  6  . traZODone (DESYREL) 50 MG tablet Take 1 tablet (50 mg total) by mouth at bedtime.  30 tablet  3    Review of Systems  Constitutional: Negative for fever and malaise/fatigue.  Gastrointestinal: Positive for nausea and abdominal pain. Negative for vomiting, diarrhea and constipation.  Genitourinary: Negative for dysuria, urgency and frequency.       Neg - vaginal bleeding, LOF   Physical Exam   Blood pressure 113/58, pulse 74, temperature 98.9 F (37.2 C), temperature source Oral, resp. rate 18, height 5\' 2"  (1.575 m), weight 200 lb (90.719 kg), not currently breastfeeding.  Physical Exam  Constitutional: She is oriented to person, place, and time. She appears well-developed and  well-nourished. No distress.  HENT:  Head: Normocephalic.  Cardiovascular: Normal rate.   Respiratory: Effort normal.  GI: Soft. She exhibits no distension and no mass. There is tenderness (mild tenderness to palpation of the mid abdomen). There is no rebound and no guarding.  Neurological: She is alert and oriented to person, place, and time.  Skin: Skin is warm and dry. No erythema.  Psychiatric: She has a normal mood and affect.   Results for orders placed during the hospital encounter of 07/06/14 (from the past 24 hour(s))  URINALYSIS, ROUTINE W REFLEX MICROSCOPIC     Status: None   Collection Time    07/06/14  4:20 PM      Result Value Ref Range   Color, Urine YELLOW  YELLOW   APPearance CLEAR  CLEAR   Specific Gravity, Urine 1.010  1.005 - 1.030   pH 6.5  5.0 - 8.0   Glucose, UA NEGATIVE  NEGATIVE mg/dL   Hgb urine dipstick NEGATIVE  NEGATIVE   Bilirubin Urine NEGATIVE  NEGATIVE   Ketones, ur NEGATIVE  NEGATIVE mg/dL   Protein, ur NEGATIVE  NEGATIVE mg/dL   Urobilinogen, UA 0.2  0.0 - 1.0 mg/dL   Nitrite NEGATIVE  NEGATIVE   Leukocytes, UA NEGATIVE  NEGATIVE   Fetal Monitoring: Baseline A: 135 bpm, moderate variability, + accelerations, no decelerations Baseline B: 130 bpm, moderate variability, + accelerations, no decelerations Contractions: None, occasional mild UI MAU Course  Procedures None  MDM UA today 1720 - Discussed patient presentation, NST and patient history with Dr. Ruthann Cancer. [facility] for discharge. Rest over the weekend and follow-up as scheduled next week.   Assessment and Plan  A: Twin gestation at [redacted]w[redacted]d Abdominal pain in pregnancy +FFN  P: Discharge home Patient advised to be on as much bedrest as possible until follow-up Patient advised to keep scheduled follow-up with [facility] for routine prenatal care Preterm labor precautions discussed Patient may return to MAU as needed or if her condition were to change or worsen   Luvenia Redden, PA-C   07/06/2014, 5:02 PM

## 2014-07-07 LAB — GC/CHLAMYDIA PROBE AMP
CT Probe RNA: NEGATIVE
GC Probe RNA: NEGATIVE

## 2014-07-07 LAB — CBC
HCT: 30.1 % — ABNORMAL LOW (ref 36.0–46.0)
Hemoglobin: 9.6 g/dL — ABNORMAL LOW (ref 12.0–15.0)
MCH: 25.9 pg — ABNORMAL LOW (ref 26.0–34.0)
MCHC: 31.9 g/dL (ref 30.0–36.0)
MCV: 81.1 fL (ref 78.0–100.0)
PLATELETS: 226 10*3/uL (ref 150–400)
RBC: 3.71 MIL/uL — AB (ref 3.87–5.11)
RDW: 14.4 % (ref 11.5–15.5)
WBC: 9.8 10*3/uL (ref 4.0–10.5)

## 2014-07-07 LAB — HIV ANTIBODY (ROUTINE TESTING W REFLEX): HIV: NONREACTIVE

## 2014-07-07 LAB — GLUCOSE TOLERANCE, 2 HOURS W/ 1HR
GLUCOSE, 2 HOUR: 91 mg/dL (ref 70–139)
GLUCOSE, FASTING: 56 mg/dL — AB (ref 70–99)
GLUCOSE: 95 mg/dL (ref 70–170)

## 2014-07-07 LAB — WET PREP BY MOLECULAR PROBE
CANDIDA SPECIES: POSITIVE — AB
Gardnerella vaginalis: NEGATIVE
Trichomonas vaginosis: NEGATIVE

## 2014-07-07 LAB — RPR

## 2014-07-08 LAB — CULTURE, OB URINE: Colony Count: 50000

## 2014-07-09 LAB — STREP B DNA PROBE: GBSP: DETECTED

## 2014-07-10 ENCOUNTER — Encounter (HOSPITAL_COMMUNITY): Payer: Self-pay | Admitting: *Deleted

## 2014-07-10 DIAGNOSIS — Z3A25 25 weeks gestation of pregnancy: Secondary | ICD-10-CM | POA: Insufficient documentation

## 2014-07-11 ENCOUNTER — Telehealth: Payer: Self-pay | Admitting: *Deleted

## 2014-07-11 NOTE — Telephone Encounter (Signed)
Patient called stating she wasn't sure if she was having contractions or not and wanted to know if she should come in to be seen or go to the hospital. Patient states she is having lower abdominal pain that is coming and going. Patient states she is having tightening pains in upper abdomen that are mostly constant but sometimes change to coming and going. Patient states she is having constant lower back pain as well. Patient states she is unsure of increase or change in discharger but that her underwear were wet earlier. Patient notified to go to MAU for monitoring. Patient voiced understanding.

## 2014-07-13 ENCOUNTER — Encounter: Payer: Self-pay | Admitting: Obstetrics & Gynecology

## 2014-07-13 ENCOUNTER — Ambulatory Visit (INDEPENDENT_AMBULATORY_CARE_PROVIDER_SITE_OTHER): Payer: Medicaid Other | Admitting: Obstetrics & Gynecology

## 2014-07-13 VITALS — BP 93/60 | HR 71 | Temp 98.6°F | Wt 195.0 lb

## 2014-07-13 DIAGNOSIS — Z3482 Encounter for supervision of other normal pregnancy, second trimester: Secondary | ICD-10-CM

## 2014-07-13 LAB — POCT URINALYSIS DIPSTICK
BILIRUBIN UA: NEGATIVE
Blood, UA: NEGATIVE
Glucose, UA: NEGATIVE
KETONES UA: NEGATIVE
Nitrite, UA: NEGATIVE
Spec Grav, UA: 1.01
Urobilinogen, UA: NEGATIVE
pH, UA: 6.5

## 2014-07-13 MED ORDER — TERCONAZOLE 0.4 % VA CREA: 1.0000 | TOPICAL_CREAM | Freq: Every day | VAGINAL | Status: DC

## 2014-07-15 NOTE — Progress Notes (Signed)
Subjective:    Lindsay Ramirez is being seen today for her obstetrical visit.  She is at [redacted]w[redacted]d gestation.  Patient reports occasional contractions.   Fetal Movement: normal.   Problem List Items Addressed This Visit    None    Visit Diagnoses    Encounter for supervision of other normal pregnancy in second trimester    -  Primary    Relevant Orders       POCT urinalysis dipstick (Completed)      Patient Active Problem List   Diagnosis Date Noted  . GBS (group B streptococcus) UTI complicating pregnancy 19/14/7829    Priority: High  . Twin gestation, dichorionic diamniotic 05/04/2014    Priority: High  . Unspecified high-risk pregnancy 04/27/2014    Priority: High  . Bipolar disorder, unspecified/depression 11/11/2012    Priority: High  . 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 Nausea/vomiting in pregnancy 04/30/2014  . Migraine without aura 03/25/2014  . 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 Gout 06/15/2013  . Acid reflux 06/21/2012   Objective:    BP 93/60 mmHg  Pulse 71  Temp(Src) 98.6 F (37 C)  Wt 88.451 kg (195 lb)  LMP  FHT:  Baby A: 160 BPM;  Baby B:  160 BPM  Uterine Size: size equals dates     Assessment:    Pregnancy @ [redacted]w[redacted]d weeks Twins, dichorionic, diamniotic.  ?Threatened PTL Plan:   Orders Placed This Encounter  Procedures  . POCT urinalysis dipstick   Follow up: 1 week.

## 2014-07-16 ENCOUNTER — Inpatient Hospital Stay (HOSPITAL_COMMUNITY)
Admission: AD | Admit: 2014-07-16 | Discharge: 2014-07-16 | Disposition: A | Discharge status: Home / Self Care | Payer: Medicaid Other | Source: Ambulatory Visit | Attending: Obstetrics & Gynecology | Admitting: Obstetrics & Gynecology

## 2014-07-16 ENCOUNTER — Encounter (HOSPITAL_COMMUNITY): Payer: Self-pay | Admitting: *Deleted

## 2014-07-16 DIAGNOSIS — R112 Nausea with vomiting, unspecified: Secondary | ICD-10-CM

## 2014-07-16 DIAGNOSIS — IMO0002 Reserved for concepts with insufficient information to code with codable children: Secondary | ICD-10-CM

## 2014-07-16 DIAGNOSIS — R197 Diarrhea, unspecified: Secondary | ICD-10-CM | POA: Diagnosis not present

## 2014-07-16 DIAGNOSIS — O30042 Twin pregnancy, dichorionic/diamniotic, second trimester: Secondary | ICD-10-CM

## 2014-07-16 DIAGNOSIS — O30002 Twin pregnancy, unspecified number of placenta and unspecified number of amniotic sacs, second trimester: Secondary | ICD-10-CM | POA: Diagnosis not present

## 2014-07-16 DIAGNOSIS — O219 Vomiting of pregnancy, unspecified: Secondary | ICD-10-CM

## 2014-07-16 DIAGNOSIS — O2342 Unspecified infection of urinary tract in pregnancy, second trimester: Secondary | ICD-10-CM

## 2014-07-16 DIAGNOSIS — Z3A27 27 weeks gestation of pregnancy: Secondary | ICD-10-CM | POA: Diagnosis not present

## 2014-07-16 DIAGNOSIS — B951 Streptococcus, group B, as the cause of diseases classified elsewhere: Secondary | ICD-10-CM

## 2014-07-16 LAB — URINALYSIS, ROUTINE W REFLEX MICROSCOPIC
Bilirubin Urine: NEGATIVE
GLUCOSE, UA: NEGATIVE mg/dL
Hgb urine dipstick: NEGATIVE
Ketones, ur: NEGATIVE mg/dL
Nitrite: NEGATIVE
PH: 6.5 (ref 5.0–8.0)
Protein, ur: NEGATIVE mg/dL
SPECIFIC GRAVITY, URINE: 1.015 (ref 1.005–1.030)
Urobilinogen, UA: 0.2 mg/dL (ref 0.0–1.0)

## 2014-07-16 LAB — URINE MICROSCOPIC-ADD ON

## 2014-07-16 MED ORDER — ONDANSETRON 8 MG PO TBDP: 8.0000 mg | ORAL_TABLET | Freq: Once | ORAL | Status: DC

## 2014-07-16 NOTE — Discharge Instructions (Signed)
Take Tylenol 325 mg 2 tablets by mouth every 4 hours if needed for pain. Take the zofran you have at home for nausea. Return if your symptoms worsen.   BRAT diet - bananas, applesauce, rice, toast.  No fried foods, no butter or oils.  Advance slowly if you do not have nausea or diarrhea.

## 2014-07-16 NOTE — MAU Provider Note (Signed)
History     CSN: [id number]  Arrival date and time: 07/16/14 1231   First Provider Initiated Contact with Patient 07/16/14 1329      Chief Complaint  Patient presents with  . Abdominal Pain  . Diarrhea  . Emesis   HPI Lindsay Ramirez 28 y.o. [redacted]w[redacted]d  Has a twin gestation.  Was seen in the office on Thursday for a routine appointment but was having some back pain on Thursday.  Last night had vomiting and then again at 2 am today.  Has not had further vomiting.  Has not had any food or fluids since vomiting.  Is having nausea.  Is having watery, brown diarrhea.   Last time for diarrhea was at home prior to coming to MAU.  Is worried about the babies.  Also worried that she is losing weight in the pregnancy.  Was 193 in the office and 195 today - advised that these weights were on 2 different scales.    OB History    Gravida Para Term Preterm AB TAB SAB Ectopic Multiple Living   6 4 3 1 1  0 1 0 0 4      Past Medical History  Diagnosis Date  . Sickle cell trait   . Mental disorder   . Bipolar affective disorder   . Bipolar affective disorder 2006    TOOK MEDS X 3 YR  . Depression     PP 2013  . Abnormal Pap smear     LAST PAP 06/2011  . Asthma     [facility]; [facility]  . Infection     UTI  . Anemia     CHRONIC  . Headache(784.0)     SEVERAL TIMES/DAY  . Vaginal Pap smear, abnormal     Past Surgical History  Procedure Laterality Date  . Mouth surgery    . Wisdom tooth extraction  2012  . Vaginal delivery      X 3  . Dilation and evacuation N/A 11/04/2013    Procedure: DILATATION AND EVACUATION;  Surgeon: Alwyn Pea, MD;  Location: [facility];  Service: Gynecology;  Laterality: N/A;    Family History  Problem Relation Age of Onset  . Hypertension Mother   . Asthma Mother   . Heart disease Mother   . Hyperlipidemia Mother   . Thyroid disease Mother   . Alcohol abuse Father   . Asthma Sister   . Depression Sister   . Diabetes Maternal Aunt   . HIV Maternal  Aunt   . Cancer Maternal Grandmother 37    COLON  . Depression Maternal Grandmother   . Alcohol abuse Maternal Grandmother   . Drug abuse Maternal Grandmother   . Alcohol abuse Maternal Grandfather   . Drug abuse Maternal Grandfather   . Stroke Maternal Grandfather   . Alcohol abuse Paternal Grandfather   . Drug abuse Paternal Grandfather   . Asthma Sister     History  Substance Use Topics  . Smoking status: Never Smoker   . Smokeless tobacco: Never Used  . Alcohol Use: No    Allergies: No Known Allergies  Prescriptions prior to admission  Medication Sig Dispense Refill Last Dose  . cyclobenzaprine (FLEXERIL) 5 MG tablet Take 1 tablet (5 mg total) by mouth 2 (two) times daily as needed for muscle spasms. 20 tablet 0 07/15/2014 at Unknown time  . Doxylamine-Pyridoxine (DICLEGIS) 10-10 MG TBEC Take 3 tablets by mouth at bedtime.   07/15/2014 at Unknown time  .  ferrous sulfate 325 (65 FE) MG tablet Take 1 tablet (325 mg total) by mouth daily with breakfast. 30 tablet 5 07/15/2014 at Unknown time  . folic acid (FOLVITE) 1 MG tablet Take 1 tablet (1 mg total) by mouth daily. 1 tablet 11 07/15/2014 at Unknown time  . ondansetron (ZOFRAN ODT) 4 MG disintegrating tablet Take 1 tablet (4 mg total) by mouth every 6 (six) hours as needed for nausea. 20 tablet 0 07/15/2014 at Unknown time  . Prenatal Vit-Fe Fumarate-FA (PRENATAL MULTIVITAMIN) TABS tablet Take 1 tablet by mouth daily.    07/15/2014 at Unknown time  . sertraline (ZOLOFT) 50 MG tablet Take 1 tablet (50 mg total) by mouth daily. 30 tablet 6 07/15/2014 at Unknown time  . terconazole (TERAZOL 7) 0.4 % vaginal cream Place 1 applicator vaginally at bedtime. 45 g 0 07/15/2014 at Unknown time  . traZODone (DESYREL) 50 MG tablet Take 1 tablet (50 mg total) by mouth at bedtime. 30 tablet 3 07/15/2014 at Unknown time    Review of Systems  Constitutional: Negative for fever.  Gastrointestinal: Positive for nausea, vomiting, abdominal pain and  diarrhea.  Musculoskeletal: Positive for back pain.   Physical Exam   not currently breastfeeding.  Physical Exam  Nursing note and vitals reviewed. Constitutional: She is oriented to person, place, and time. She appears well-developed and well-nourished.  HENT:  Head: Normocephalic.  Eyes: EOM are normal.  Neck: Neck supple.  GI: Soft. There is tenderness. There is no rebound and no guarding.  Fetal monitor - no contractions seen in 20 minutes.  FHT traced on both babies - stable.  Will discontinue the monitor.  Genitourinary:  Speculum exam: Vagina - Small amount of creamy discharge, no odor Cervix - No contact bleeding, appears closed Bimanual exam: Cervix closed, thick, no presenting part felt Uterus non tender, gravid Adnexa non tender, no masses bilaterally Chaperone present for exam.  Musculoskeletal: Normal range of motion.  Neurological: She is alert and oriented to person, place, and time.  Skin: Skin is warm and dry.  Psychiatric: She has a normal mood and affect.    MAU Course  Procedures Results for orders placed or performed during the hospital encounter of 07/16/14 (from the past 24 hour(s))  Urinalysis, Routine w reflex microscopic     Status: Abnormal   Collection Time: 07/16/14 12:45 PM  Result Value Ref Range   Color, Urine YELLOW YELLOW   APPearance CLEAR CLEAR   Specific Gravity, Urine 1.015 1.005 - 1.030   pH 6.5 5.0 - 8.0   Glucose, UA NEGATIVE NEGATIVE mg/dL   Hgb urine dipstick NEGATIVE NEGATIVE   Bilirubin Urine NEGATIVE NEGATIVE   Ketones, ur NEGATIVE NEGATIVE mg/dL   Protein, ur NEGATIVE NEGATIVE mg/dL   Urobilinogen, UA 0.2 0.0 - 1.0 mg/dL   Nitrite NEGATIVE NEGATIVE   Leukocytes, UA SMALL (A) NEGATIVE  Urine microscopic-add on     Status: Abnormal   Collection Time: 07/16/14 12:45 PM  Result Value Ref Range   Squamous Epithelial / LPF FEW (A) RARE   WBC, UA 3-6 <3 WBC/hpf   RBC / HPF 0-2 <3 RBC/hpf   Bacteria, UA FEW (A) RARE     MDM Likely has a GI upset due to watery diarrhea.  Took one diclegis at home but did not stop the nausea.  States she has Zofran at home but did not try the Zofran.  Is frustrated and wants to go home but consented to speculum exam prior to leaving.  Decline  Zofran here.  Declines IVF.  Will try PO fluids for now.  Speculum exam  - cervix is closed.  Likely is not preterm labor.  Assessment and Plan  Vomiting and diarrhea  Plan Client declines IVF and any medication.  Took some water by mouth and now is calling out to go home. Take Tylenol 325 mg 2 tablets by mouth every 4 hours if needed for pain. Take the zofran you have at home for nausea. Return if your symptoms worsen.   BRAT diet - bananas, applesauce, rice, toast.  No fried foods, no butter or oils.  Advance slowly if you do not have nausea or diarrhea. Urine culture pending.  BURLESON,TERRI 07/16/2014, 1:39 PM

## 2014-07-16 NOTE — MAU Note (Signed)
Pt presents to MAU with complaints of nausea, vomiting, diarrhea, and abdominal pain since last night.

## 2014-07-17 LAB — URINE CULTURE: Colony Count: 9000

## 2014-07-20 ENCOUNTER — Other Ambulatory Visit: Payer: Self-pay | Admitting: Obstetrics

## 2014-07-20 ENCOUNTER — Ambulatory Visit (INDEPENDENT_AMBULATORY_CARE_PROVIDER_SITE_OTHER): Payer: Medicaid Other | Admitting: Obstetrics

## 2014-07-20 VITALS — BP 113/63 | HR 79 | Temp 97.7°F | Wt 197.0 lb

## 2014-07-20 DIAGNOSIS — O30002 Twin pregnancy, unspecified number of placenta and unspecified number of amniotic sacs, second trimester: Secondary | ICD-10-CM

## 2014-07-20 DIAGNOSIS — O30003 Twin pregnancy, unspecified number of placenta and unspecified number of amniotic sacs, third trimester: Secondary | ICD-10-CM

## 2014-07-20 DIAGNOSIS — R52 Pain, unspecified: Secondary | ICD-10-CM

## 2014-07-20 DIAGNOSIS — O30043 Twin pregnancy, dichorionic/diamniotic, third trimester: Secondary | ICD-10-CM

## 2014-07-20 LAB — POCT URINALYSIS DIPSTICK
Blood, UA: NEGATIVE
GLUCOSE UA: NEGATIVE
Ketones, UA: NEGATIVE
LEUKOCYTES UA: NEGATIVE
Nitrite, UA: NEGATIVE
PROTEIN UA: NEGATIVE
Spec Grav, UA: 1.015
pH, UA: 6

## 2014-07-20 MED ORDER — OXYCODONE HCL 10 MG PO TABS: 10.0000 mg | ORAL_TABLET | Freq: Four times a day (QID) | ORAL | Status: DC | PRN

## 2014-07-22 ENCOUNTER — Encounter: Payer: Self-pay | Admitting: Obstetrics

## 2014-07-22 DIAGNOSIS — R52 Pain, unspecified: Secondary | ICD-10-CM | POA: Insufficient documentation

## 2014-07-22 NOTE — Progress Notes (Signed)
Subjective:    Lindsay Ramirez is a 28 y.o. female being seen today for her obstetrical visit. She is at [redacted]w[redacted]d gestation. Patient reports: no complaints . Fetal movement: normal.  Problem List Items Addressed This Visit    None    Visit Diagnoses    Twin gestation in second trimester, unspecified placenta and amniotic sac number    -  Primary    Relevant Orders       POCT urinalysis dipstick (Completed)       US OB Comp + 14 Wk       AMB Referral to Maternal Fetal Medicine (MFM)    Pain aggravated by activities of daily living        Relevant Medications       Oxycodone HCl 10 MG TABS      Patient Active Problem List   Diagnosis Date Noted  . GBS (group B streptococcus) UTI complicating pregnancy 85/88/5027  . 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 Twin gestation, dichorionic diamniotic 05/04/2014  . Nausea/vomiting in pregnancy 04/30/2014  . Unspecified high-risk pregnancy 04/27/2014  . Migraine without aura 03/25/2014  . 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 Gout 06/15/2013  . Bipolar disorder, unspecified/depression 11/11/2012  . 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 Acid reflux 06/21/2012   Objective:    BP 113/63 mmHg  Pulse 79  Temp(Src) 97.7 F (36.5 C)  Wt 197 lb (89.359 kg)  LMP  FHT: 160x2 BPM  Uterine Size: consistent with twins     Assessment:    Pregnancy @ [redacted]w[redacted]d    Plan:    OBGCT: discussed.  Labs, problem list reviewed and updated Follow up in 2 weeks.

## 2014-07-25 ENCOUNTER — Encounter: Payer: Self-pay | Admitting: Obstetrics

## 2014-07-25 ENCOUNTER — Ambulatory Visit (INDEPENDENT_AMBULATORY_CARE_PROVIDER_SITE_OTHER): Payer: Medicaid Other | Admitting: Obstetrics

## 2014-07-25 ENCOUNTER — Telehealth: Payer: Self-pay | Admitting: *Deleted

## 2014-07-25 VITALS — BP 106/61 | HR 62 | Wt 200.0 lb

## 2014-07-25 DIAGNOSIS — Z3483 Encounter for supervision of other normal pregnancy, third trimester: Secondary | ICD-10-CM

## 2014-07-25 DIAGNOSIS — O30043 Twin pregnancy, dichorionic/diamniotic, third trimester: Secondary | ICD-10-CM

## 2014-07-25 LAB — POCT URINALYSIS DIPSTICK
Bilirubin, UA: NEGATIVE
Blood, UA: NEGATIVE
Glucose, UA: NEGATIVE
KETONES UA: NEGATIVE
Nitrite, UA: NEGATIVE
PH UA: 6
PROTEIN UA: NEGATIVE
Spec Grav, UA: 1.01
UROBILINOGEN UA: NEGATIVE

## 2014-07-25 NOTE — Patient Instructions (Addendum)
Fetal Movement Counts Patient Name: ____Sheena Richmond_____________________________________ Patient Due Date: _____2-8-1016_______________ Performing a fetal movement count is highly recommended in high-risk pregnancies, but it is good for every pregnant woman to do. Your health care provider may ask you to start counting fetal movements at 28 weeks of the pregnancy. Fetal movements often increase:  After eating a full meal.  After physical activity.  After eating or drinking something sweet or cold.  At rest. Pay attention to when you feel the baby is most active. This will help you notice a pattern of your baby's sleep and wake cycles and what factors contribute to an increase in fetal movement. It is important to perform a fetal movement count at the same time each day when your baby is normally most active.  HOW TO COUNT FETAL MOVEMENTS 1. Find a quiet and comfortable area to sit or lie down on your left side. Lying on your left side provides the best blood and oxygen circulation to your baby. 2. Write down the day and time on a sheet of paper or in a journal. 3. Start counting kicks, flutters, swishes, rolls, or jabs in a 2-hour period. You should feel at least 10 movements within 2 hours. 4. If you do not feel 10 movements in 2 hours, wait 2-3 hours and count again. Look for a change in the pattern or not enough counts in 2 hours. SEEK MEDICAL CARE IF:  You feel less than 10 counts in 2 hours, tried twice.  There is no movement in over an hour.  The pattern is changing or taking longer each day to reach 10 counts in 2 hours.  You feel the baby is not moving as he or she usually does. Date: ____________ Movements: ____________ Start time: ____________ Elizebeth Koller time: ____________  Date: ____________ Movements: ____________ Start time: ____________ Elizebeth Koller time: ____________ Date: ____________ Movements: ____________ Start time: ____________ Elizebeth Koller time: ____________ Date: ____________  Movements: ____________ Start time: ____________ Elizebeth Koller time: ____________ Date: ____________ Movements: ____________ Start time: ____________ Elizebeth Koller time: ____________ Date: ____________ Movements: ____________ Start time: ____________ Elizebeth Koller time: ____________ Date: ____________ Movements: ____________ Start time: ____________ Elizebeth Koller time: ____________ Date: ____________ Movements: ____________ Start time: ____________ Elizebeth Koller time: ____________  Date: ____________ Movements: ____________ Start time: ____________ Elizebeth Koller time: ____________ Date: ____________ Movements: ____________ Start time: ____________ Elizebeth Koller time: ____________ Date: ____________ Movements: ____________ Start time: ____________ Elizebeth Koller time: ____________ Date: ____________ Movements: ____________ Start time: ____________ Elizebeth Koller time: ____________ Date: ____________ Movements: ____________ Start time: ____________ Elizebeth Koller time: ____________ Date: ____________ Movements: ____________ Start time: ____________ Elizebeth Koller time: ____________ Date: ____________ Movements: ____________ Start time: ____________ Elizebeth Koller time: ____________  Date: ____________ Movements: ____________ Start time: ____________ Elizebeth Koller time: ____________ Date: ____________ Movements: ____________ Start time: ____________ Elizebeth Koller time: ____________ Date: ____________ Movements: ____________ Start time: ____________ Elizebeth Koller time: ____________ Date: ____________ Movements: ____________ Start time: ____________ Elizebeth Koller time: ____________ Date: ____________ Movements: ____________ Start time: ____________ Elizebeth Koller time: ____________ Date: ____________ Movements: ____________ Start time: ____________ Elizebeth Koller time: ____________ Date: ____________ Movements: ____________ Start time: ____________ Elizebeth Koller time: ____________  Date: ____________ Movements: ____________ Start time: ____________ Elizebeth Koller time: ____________ Date: ____________ Movements: ____________ Start time:  ____________ Elizebeth Koller time: ____________ Date: ____________ Movements: ____________ Start time: ____________ Elizebeth Koller time: ____________ Date: ____________ Movements: ____________ Start time: ____________ Elizebeth Koller time: ____________ Date: ____________ Movements: ____________ Start time: ____________ Elizebeth Koller time: ____________ Date: ____________ Movements: ____________ Start time: ____________ Elizebeth Koller time: ____________ Date: ____________ Movements: ____________ Start time: ____________ Elizebeth Koller time: ____________  Date: ____________ Movements: ____________ Start time: ____________  Finish time: ____________ Date: ____________ Movements: ____________ Start time: ____________ Elizebeth Koller time: ____________ Date: ____________ Movements: ____________ Start time: ____________ Elizebeth Koller time: ____________ Date: ____________ Movements: ____________ Start time: ____________ Elizebeth Koller time: ____________ Date: ____________ Movements: ____________ Start time: ____________ Elizebeth Koller time: ____________ Date: ____________ Movements: ____________ Start time: ____________ Elizebeth Koller time: ____________ Date: ____________ Movements: ____________ Start time: ____________ Elizebeth Koller time: ____________  Date: ____________ Movements: ____________ Start time: ____________ Elizebeth Koller time: ____________ Date: ____________ Movements: ____________ Start time: ____________ Elizebeth Koller time: ____________ Date: ____________ Movements: ____________ Start time: ____________ Elizebeth Koller time: ____________ Date: ____________ Movements: ____________ Start time: ____________ Elizebeth Koller time: ____________ Date: ____________ Movements: ____________ Start time: ____________ Elizebeth Koller time: ____________ Date: ____________ Movements: ____________ Start time: ____________ Elizebeth Koller time: ____________ Date: ____________ Movements: ____________ Start time: ____________ Elizebeth Koller time: ____________  Date: ____________ Movements: ____________ Start time: ____________ Elizebeth Koller time: ____________ Date:  ____________ Movements: ____________ Start time: ____________ Elizebeth Koller time: ____________ Date: ____________ Movements: ____________ Start time: ____________ Elizebeth Koller time: ____________ Date: ____________ Movements: ____________ Start time: ____________ Elizebeth Koller time: ____________ Date: ____________ Movements: ____________ Start time: ____________ Elizebeth Koller time: ____________ Date: ____________ Movements: ____________ Start time: ____________ Elizebeth Koller time: ____________ Date: ____________ Movements: ____________ Start time: ____________ Elizebeth Koller time: ____________  Date: ____________ Movements: ____________ Start time: ____________ Elizebeth Koller time: ____________ Date: ____________ Movements: ____________ Start time: ____________ Elizebeth Koller time: ____________ Date: ____________ Movements: ____________ Start time: ____________ Elizebeth Koller time: ____________ Date: ____________ Movements: ____________ Start time: ____________ Elizebeth Koller time: ____________ Date: ____________ Movements: ____________ Start time: ____________ Elizebeth Koller time: ____________ Date: ____________ Movements: ____________ Start time: ____________ Elizebeth Koller time: ____________ Document Released: 09/24/2006 Document Revised: 01/09/2014 Document Reviewed: 06/21/2012 Exit[employer] Patient Information 2015 [employer]. This information is not intended to replace advice given to you by your health care provider. Make sure you discuss any questions you have with your health care provider.

## 2014-07-25 NOTE — Progress Notes (Addendum)
Subjective:    Lindsay Ramirez is a 28 y.o. female being seen today for her obstetrical visit. She is at [redacted]w[redacted]d gestation. Patient reports backache. Fetal movement: Decreased  Problem List Items Addressed This Visit    Twin gestation, dichorionic diamniotic - Primary    Other Visit Diagnoses    Encounter for supervision of other normal pregnancy in third trimester        Relevant Orders       POCT urinalysis dipstick (Completed)      Patient Active Problem List   Diagnosis Date Noted  . Pain aggravated by activities of daily living 07/22/2014  . GBS (group B streptococcus) UTI complicating pregnancy 13/04/6577  . 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 Twin gestation, dichorionic diamniotic 05/04/2014  . Nausea/vomiting in pregnancy 04/30/2014  . Unspecified high-risk pregnancy 04/27/2014  . Migraine without aura 03/25/2014  . 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 Gout 06/15/2013  . Bipolar disorder, unspecified/depression 11/11/2012  . 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 Acid reflux 06/21/2012   Objective:    BP 106/61 mmHg  Pulse 62  Wt 200 lb (90.719 kg)  LMP  FHT:  160x2 BPM  Uterine Size: size greater than dates  Presentation: unsure     Assessment:    Pregnancy @ [redacted]w[redacted]d weeks    NST:  Toco reveals UC's q 8-12 minutes.  Plan:   Sent to [facility] for further monitoring.    labs reviewed, problem list updated Consent signed. GBS sent TDAP offered  Rhogam given for RH negative Pediatrician: discussed. Infant feeding: plans to breastfeed. Maternity leave: discussed. Cigarette smoking: never smoked. Orders Placed This Encounter  Procedures  . POCT urinalysis dipstick   No orders of the defined types were placed in this encounter.   Follow up in 1 Week.

## 2014-07-25 NOTE — Telephone Encounter (Signed)
Patient is having some pain- she is not sure if normal pain or contractions. 1:23 LM on VM- may come to office for NST if needs. Please call back.

## 2014-07-27 ENCOUNTER — Encounter: Payer: Medicaid Other | Admitting: Obstetrics & Gynecology

## 2014-07-27 ENCOUNTER — Ambulatory Visit (INDEPENDENT_AMBULATORY_CARE_PROVIDER_SITE_OTHER): Payer: Medicaid Other | Admitting: Obstetrics & Gynecology

## 2014-07-27 ENCOUNTER — Encounter: Payer: Self-pay | Admitting: Obstetrics & Gynecology

## 2014-07-27 VITALS — BP 100/65 | HR 80 | Temp 99.2°F | Wt 200.0 lb

## 2014-07-27 DIAGNOSIS — B951 Streptococcus, group B, as the cause of diseases classified elsewhere: Secondary | ICD-10-CM

## 2014-07-27 DIAGNOSIS — O30002 Twin pregnancy, unspecified number of placenta and unspecified number of amniotic sacs, second trimester: Secondary | ICD-10-CM

## 2014-07-27 DIAGNOSIS — O2340 Unspecified infection of urinary tract in pregnancy, unspecified trimester: Secondary | ICD-10-CM

## 2014-07-27 LAB — POCT URINALYSIS DIPSTICK
Bilirubin, UA: NEGATIVE
Blood, UA: NEGATIVE
Glucose, UA: NEGATIVE
KETONES UA: NEGATIVE
NITRITE UA: NEGATIVE
PH UA: 6.5
Spec Grav, UA: 1.01
Urobilinogen, UA: NEGATIVE

## 2014-07-28 ENCOUNTER — Encounter (HOSPITAL_COMMUNITY): Payer: Self-pay

## 2014-07-28 ENCOUNTER — Other Ambulatory Visit: Payer: Self-pay | Admitting: Obstetrics

## 2014-07-28 ENCOUNTER — Ambulatory Visit (HOSPITAL_COMMUNITY)
Admission: RE | Admit: 2014-07-28 | Discharge: 2014-07-28 | Disposition: A | Discharge status: Home / Self Care | Payer: Medicaid Other | Source: Ambulatory Visit | Attending: Obstetrics & Gynecology | Admitting: Obstetrics & Gynecology

## 2014-07-28 DIAGNOSIS — Z3A28 28 weeks gestation of pregnancy: Secondary | ICD-10-CM | POA: Insufficient documentation

## 2014-07-28 DIAGNOSIS — O30043 Twin pregnancy, dichorionic/diamniotic, third trimester: Secondary | ICD-10-CM | POA: Insufficient documentation

## 2014-07-28 DIAGNOSIS — O30002 Twin pregnancy, unspecified number of placenta and unspecified number of amniotic sacs, second trimester: Secondary | ICD-10-CM

## 2014-07-28 NOTE — Progress Notes (Signed)
Subjective:    Lindsay Ramirez is being seen today for her obstetrical visit.  She is at [redacted]w[redacted]d gestation.  Patient reports occasional contractions.   Fetal Movement: normal.   Problem List Items Addressed This Visit    GBS (group B streptococcus) UTI complicating pregnancy    Other Visit Diagnoses    Twin gestation in second trimester, unspecified placenta and amniotic sac number    -  Primary    Relevant Orders       POCT urinalysis dipstick (Completed)      Patient Active Problem List   Diagnosis Date Noted  . GBS (group B streptococcus) UTI complicating pregnancy 75/91/6384    Priority: High  . Twin gestation, dichorionic diamniotic 05/04/2014    Priority: High  . Unspecified high-risk pregnancy 04/27/2014    Priority: High  . Airway hyperreactivity 06/15/2013    Priority: High  . Bipolar disorder, unspecified/depression 11/11/2012    Priority: High  . 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 Pain aggravated by activities of daily living 07/22/2014  . 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 Nausea/vomiting in pregnancy 04/30/2014  . Migraine without aura 03/25/2014  . Gout 06/15/2013  . Acid reflux 06/21/2012   Objective:    BP 100/65 mmHg  Pulse 80  Temp(Src) 99.2 F (37.3 C)  Wt 90.719 kg (200 lb)  LMP  FHT:  Baby A: 160 BPM;  Baby B:  160 BPM  Uterine Size: size equals dates     Assessment:    Pregnancy @ [redacted]w[redacted]d weeks Twins, dichorionic, diamniotic.  ?acute exacerbation of her bipolar d/o--high score on depression tool Plan:   Orders Placed This Encounter  Procedures  . POCT urinalysis dipstick  Referral-->Behavioral health Follow up: 1 week.

## 2014-07-28 NOTE — Patient Instructions (Signed)

## 2014-08-02 ENCOUNTER — Encounter: Payer: Self-pay | Admitting: Obstetrics & Gynecology

## 2014-08-02 ENCOUNTER — Ambulatory Visit (INDEPENDENT_AMBULATORY_CARE_PROVIDER_SITE_OTHER): Payer: Medicaid Other | Admitting: Obstetrics & Gynecology

## 2014-08-02 VITALS — BP 102/64 | HR 84 | Wt 202.0 lb

## 2014-08-02 DIAGNOSIS — O30002 Twin pregnancy, unspecified number of placenta and unspecified number of amniotic sacs, second trimester: Secondary | ICD-10-CM

## 2014-08-02 DIAGNOSIS — R0602 Shortness of breath: Secondary | ICD-10-CM

## 2014-08-02 DIAGNOSIS — O9989 Other specified diseases and conditions complicating pregnancy, childbirth and the puerperium: Secondary | ICD-10-CM

## 2014-08-02 DIAGNOSIS — R102 Pelvic and perineal pain: Secondary | ICD-10-CM

## 2014-08-02 DIAGNOSIS — O26899 Other specified pregnancy related conditions, unspecified trimester: Secondary | ICD-10-CM

## 2014-08-02 MED ORDER — ALBUTEROL SULFATE HFA 108 (90 BASE) MCG/ACT IN AERS: 2.0000 | INHALATION_SPRAY | Freq: Four times a day (QID) | RESPIRATORY_TRACT | Status: DC | PRN

## 2014-08-02 MED ORDER — TRAMADOL HCL 50 MG PO TABS: 50.0000 mg | ORAL_TABLET | Freq: Four times a day (QID) | ORAL | Status: DC | PRN

## 2014-08-02 NOTE — Progress Notes (Signed)
O2 sat. [address]  [phone]

## 2014-08-04 LAB — POCT URINALYSIS DIPSTICK
BILIRUBIN UA: NEGATIVE
Glucose, UA: NEGATIVE
Ketones, UA: NEGATIVE
Leukocytes, UA: NEGATIVE
Nitrite, UA: NEGATIVE
PH UA: 6
RBC UA: NEGATIVE
Spec Grav, UA: 1.01
Urobilinogen, UA: NEGATIVE

## 2014-08-06 NOTE — Progress Notes (Signed)
Subjective:    Lindsay Ramirez is being seen today for her obstetrical visit.  She is at [redacted]w[redacted]d gestation.  Patient reports occasional contractions.   Fetal Movement: normal.   Problem List Items Addressed This Visit    None    Visit Diagnoses    Pelvic pain affecting pregnancy    -  Primary    Relevant Medications       Tramadol HCL (ULTRAM) 50 mg po tab    Other Relevant Orders       POCT urinalysis dipstick (Completed)    SOB (shortness of breath)        Relevant Medications       albuterol (PROVENTIL HFA;VENTOLIN HFA) inhaler      Patient Active Problem List   Diagnosis Date Noted  . GBS (group B streptococcus) UTI complicating pregnancy 04/59/9774    Priority: High  . Twin gestation, dichorionic diamniotic 05/04/2014    Priority: High  . Unspecified high-risk pregnancy 04/27/2014    Priority: High  . Airway hyperreactivity 06/15/2013    Priority: High  . Bipolar disorder, unspecified/depression 11/11/2012    Priority: High  . 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 Dichorionic diamniotic twin pregnancy in third trimester   . [redacted] weeks gestation of pregnancy   . Pain aggravated by activities of daily living 07/22/2014  . 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 Nausea/vomiting in pregnancy 04/30/2014  . Migraine without aura 03/25/2014  . Gout 06/15/2013  . Acid reflux 06/21/2012   Objective:    BP 102/64 mmHg  Pulse 84  Wt 91.627 kg (202 lb)  LMP  FHT:  Baby A: 160 BPM;  Baby B:  160 BPM  Uterine Size: size equals dates     Assessment:    Pregnancy @ [redacted]w[redacted]d weeks Twins, dichorionic, diamniotic.   Plan:   Orders Placed This Encounter  Procedures  . POCT urinalysis dipstick   Follow up: 1 week.

## 2014-08-07 ENCOUNTER — Encounter (HOSPITAL_COMMUNITY): Payer: Self-pay | Admitting: General Practice

## 2014-08-07 ENCOUNTER — Inpatient Hospital Stay (HOSPITAL_COMMUNITY)
Admission: AD | Admit: 2014-08-07 | Discharge: 2014-08-08 | Disposition: A | Discharge status: Home / Self Care | Payer: Medicaid Other | Source: Ambulatory Visit | Attending: Obstetrics & Gynecology | Admitting: Obstetrics & Gynecology

## 2014-08-07 DIAGNOSIS — O2343 Unspecified infection of urinary tract in pregnancy, third trimester: Secondary | ICD-10-CM | POA: Diagnosis not present

## 2014-08-07 DIAGNOSIS — O212 Late vomiting of pregnancy: Secondary | ICD-10-CM | POA: Insufficient documentation

## 2014-08-07 DIAGNOSIS — O4703 False labor before 37 completed weeks of gestation, third trimester: Secondary | ICD-10-CM

## 2014-08-07 DIAGNOSIS — O30043 Twin pregnancy, dichorionic/diamniotic, third trimester: Secondary | ICD-10-CM | POA: Diagnosis not present

## 2014-08-07 DIAGNOSIS — O99820 Streptococcus B carrier state complicating pregnancy: Secondary | ICD-10-CM | POA: Diagnosis not present

## 2014-08-07 DIAGNOSIS — IMO0002 Reserved for concepts with insufficient information to code with codable children: Secondary | ICD-10-CM

## 2014-08-07 DIAGNOSIS — Z3A30 30 weeks gestation of pregnancy: Secondary | ICD-10-CM | POA: Diagnosis not present

## 2014-08-07 DIAGNOSIS — O30042 Twin pregnancy, dichorionic/diamniotic, second trimester: Secondary | ICD-10-CM

## 2014-08-07 DIAGNOSIS — O219 Vomiting of pregnancy, unspecified: Secondary | ICD-10-CM

## 2014-08-07 DIAGNOSIS — O2340 Unspecified infection of urinary tract in pregnancy, unspecified trimester: Secondary | ICD-10-CM

## 2014-08-07 DIAGNOSIS — B951 Streptococcus, group B, as the cause of diseases classified elsewhere: Secondary | ICD-10-CM

## 2014-08-07 DIAGNOSIS — O479 False labor, unspecified: Secondary | ICD-10-CM

## 2014-08-07 LAB — URINE MICROSCOPIC-ADD ON: RBC / HPF: NONE SEEN RBC/hpf (ref <3)

## 2014-08-07 LAB — URINALYSIS, ROUTINE W REFLEX MICROSCOPIC
Bilirubin Urine: NEGATIVE
GLUCOSE, UA: NEGATIVE mg/dL
HGB URINE DIPSTICK: NEGATIVE
Ketones, ur: NEGATIVE mg/dL
Nitrite: NEGATIVE
Protein, ur: NEGATIVE mg/dL
SPECIFIC GRAVITY, URINE: 1.02 (ref 1.005–1.030)
Urobilinogen, UA: 0.2 mg/dL (ref 0.0–1.0)
pH: 6 (ref 5.0–8.0)

## 2014-08-07 LAB — WET PREP, GENITAL
CLUE CELLS WET PREP: NONE SEEN
TRICH WET PREP: NONE SEEN
YEAST WET PREP: NONE SEEN

## 2014-08-07 MED ORDER — NIFEDIPINE 10 MG PO CAPS
10.0000 mg | ORAL_CAPSULE | ORAL | Status: AC
  Administered 2014-08-07 – 2014-08-08 (×3): 10 mg via ORAL
  Filled 2014-08-07 (×3): qty 1

## 2014-08-07 NOTE — MAU Note (Signed)
PT  SAYS SHE HAS BEEN  HAVING UC  ALL DAY.      AND HAS BEEN  OFF/ON.   NO MMEDS    FOR UC.    LAST SEX-    2 WEEKS AGO.    SEEN IN OFFICE  LAST WED-  J-M.      NEXT APPOINTMENT  12-3.

## 2014-08-07 NOTE — MAU Note (Signed)
Went into the room to put patient on the monitor. Pt was changing her other child's diaper on the bed.

## 2014-08-07 NOTE — MAU Provider Note (Signed)
History     CSN: [id number]  Arrival date and time: 08/07/14 2158   First Provider Initiated Contact with Patient 08/07/14 2245      No chief complaint on file.  HPI  Lindsay Ramirez is a 28 y.o. [id number] at [redacted]w[redacted]d who presents today with contractions. She states that she started having contractions earlier today. She denies any VB or lOF. She states that the fetuses have been less active today. She states that she was seen at the office on 11/20 and was checked at that visit. She states that she was closed at that time. She was also seen at the office yesterday. She reported contractions at that visit, but she was not checked.   She states that her oldest was born around her due date, but her last two were born early. She is unsure of the GA, but she feels that they were born before 40 weeks. Neither of them had to go to the NICU.   Past Medical History  Diagnosis Date  . Sickle cell trait   . Mental disorder   . Bipolar affective disorder   . Bipolar affective disorder 2006    TOOK MEDS X 3 YR  . Depression     PP 2013  . Abnormal Pap smear     LAST PAP 06/2011  . Asthma     INHAL[facility]; [facility]  . Infection     UTI  . Anemia     CHRONIC  . Headache(784.0)     SEVERAL TIMES/DAY  . Vaginal Pap smear, abnormal     Past Surgical History  Procedure Laterality Date  . Mouth surgery    . Wisdom tooth extraction  2012  . Vaginal delivery      X 3  . Dilation and evacuation N/A 11/04/2013    Procedure: DILATATION AND EVACUATION;  Surgeon: Alwyn Pea, MD;  Location: [facility];  Service: Gynecology;  Laterality: N/A;    Family History  Problem Relation Age of Onset  . Hypertension Mother   . Asthma Mother   . Heart disease Mother   . Hyperlipidemia Mother   . Thyroid disease Mother   . Alcohol abuse Father   . Asthma Sister   . Depression Sister   . Diabetes Maternal Aunt   . HIV Maternal Aunt   . Cancer Maternal Grandmother 37    COLON  . Depression  Maternal Grandmother   . Alcohol abuse Maternal Grandmother   . Drug abuse Maternal Grandmother   . Alcohol abuse Maternal Grandfather   . Drug abuse Maternal Grandfather   . Stroke Maternal Grandfather   . Alcohol abuse Paternal Grandfather   . Drug abuse Paternal Grandfather   . Asthma Sister     History  Substance Use Topics  . Smoking status: Never Smoker   . Smokeless tobacco: Never Used  . Alcohol Use: No    Allergies: No Known Allergies  Prescriptions prior to admission  Medication Sig Dispense Refill Last Dose  . albuterol (PROVENTIL HFA;VENTOLIN HFA) 108 (90 BASE) MCG/ACT inhaler Inhale 2 puffs into the lungs every 6 (six) hours as needed for wheezing or shortness of breath. 1 Inhaler 2   . cyclobenzaprine (FLEXERIL) 5 MG tablet Take 1 tablet (5 mg total) by mouth 2 (two) times daily as needed for muscle spasms. 20 tablet 0 Taking  . Doxylamine-Pyridoxine (DICLEGIS) 10-10 MG TBEC Take 3 tablets by mouth at bedtime.   Taking  . ferrous sulfate 325 (65 FE) MG  tablet Take 1 tablet (325 mg total) by mouth daily with breakfast. 30 tablet 5 Taking  . folic acid (FOLVITE) 1 MG tablet Take 1 tablet (1 mg total) by mouth daily. 1 tablet 11 Taking  . ondansetron (ZOFRAN ODT) 4 MG disintegrating tablet Take 1 tablet (4 mg total) by mouth every 6 (six) hours as needed for nausea. 20 tablet 0 Taking  . Oxycodone HCl 10 MG TABS Take 1 tablet (10 mg total) by mouth every 6 (six) hours as needed. (Patient not taking: Reported on 08/02/2014) 40 tablet 0 Not Taking  . Prenatal Vit-Fe Fumarate-FA (PRENATAL MULTIVITAMIN) TABS tablet Take 1 tablet by mouth daily.    Taking  . sertraline (ZOLOFT) 50 MG tablet Take 1 tablet (50 mg total) by mouth daily. 30 tablet 6 Taking  . traMADol (ULTRAM) 50 MG tablet Take 1 tablet (50 mg total) by mouth every 6 (six) hours as needed. 90 tablet 0   . traZODone (DESYREL) 50 MG tablet Take 1 tablet (50 mg total) by mouth at bedtime. 30 tablet 3 Taking     ROS Physical Exam   Blood pressure 109/59, pulse 89, temperature 98.6 F (37 C), temperature source Oral, resp. rate 18, height 5\' 1"  (1.549 m), weight 90.493 kg (199 lb 8 oz), not currently breastfeeding.  Physical Exam  Nursing note and vitals reviewed. Constitutional: She is oriented to person, place, and time. She appears well-developed and well-nourished. No distress.  Cardiovascular: Normal rate.   Respiratory: Effort normal.  GI: Soft. There is no tenderness. There is no rebound.  Genitourinary:  External: no lesion Vagina: small amount of white discharge Cervix: pink, smooth, no CMT Uterus: AGA  Closed/50/-2/-3/soft   Neurological: She is alert and oriented to person, place, and time.  Skin: Skin is warm and dry.  Psychiatric: She has a normal mood and affect.    MAU Course  Procedures   Results for orders placed or performed during the hospital encounter of 08/07/14 (from the past 24 hour(s))  Urinalysis, Routine w reflex microscopic     Status: Abnormal   Collection Time: 08/07/14 10:18 PM  Result Value Ref Range   Color, Urine YELLOW YELLOW   APPearance CLEAR CLEAR   Specific Gravity, Urine 1.020 1.005 - 1.030   pH 6.0 5.0 - 8.0   Glucose, UA NEGATIVE NEGATIVE mg/dL   Hgb urine dipstick NEGATIVE NEGATIVE   Bilirubin Urine NEGATIVE NEGATIVE   Ketones, ur NEGATIVE NEGATIVE mg/dL   Protein, ur NEGATIVE NEGATIVE mg/dL   Urobilinogen, UA 0.2 0.0 - 1.0 mg/dL   Nitrite NEGATIVE NEGATIVE   Leukocytes, UA TRACE (A) NEGATIVE  Urine microscopic-add on     Status: None   Collection Time: 08/07/14 10:18 PM  Result Value Ref Range   Squamous Epithelial / LPF RARE RARE   WBC, UA 7-10 <3 WBC/hpf   RBC / HPF  <3 RBC/hpf    NO FORMED ELEMENTS SEEN ON URINE MICROSCOPIC EXAMINATION   Bacteria, UA RARE RARE  Wet prep, genital     Status: Abnormal   Collection Time: 08/07/14 10:57 PM  Result Value Ref Range   Yeast Wet Prep HPF POC NONE SEEN NONE SEEN   Trich, Wet  Prep NONE SEEN NONE SEEN   Clue Cells Wet Prep HPF POC NONE SEEN NONE SEEN   WBC, Wet Prep HPF POC FEW (A) NONE SEEN     FHT Baby a 145, moderate with 15x15 accels, no decels Baby B 140, moderate with 15x15 accels, no  decels Toco: initially about every 3-8 mins, after 3 doses of procardia, more irritability and very irregular 0027: Patient reports that contractions are not as intense. Recheck of cervix and she is still closed/50/-2/-3/soft Assessment and Plan   1. GBS (group B streptococcus) UTI complicating pregnancy, unspecified trimester   2. ASCUS with positive high risk HPV   3. Dichorionic diamniotic twin pregnancy in second trimester   4. Nausea/vomiting in pregnancy   5. Labor, false (Braxton-Hicks), antepartum    PTL precautions Fetal kick counts Return to MAU as needed FU with the office as planned for Thursday  Follow-up Information    Follow up with [facility].   Specialty:  Obstetrics and Gynecology   Why:  As scheduled   Contact information:   [address], [address] [place] [phone]       Mathis Bud 08/07/2014, 10:46 PM

## 2014-08-08 NOTE — Discharge Instructions (Signed)
Third Trimester of Pregnancy The third trimester is from week 29 through week 42, months 7 through 9. The third trimester is a time when the fetus is growing rapidly. At the end of the ninth month, the fetus is about 20 inches in length and weighs 6-10 pounds.  BODY CHANGES Your body goes through many changes during pregnancy. The changes vary from woman to woman.   Your weight will continue to increase. You can expect to gain 25-35 pounds (11-16 kg) by the end of the pregnancy.  You may begin to get stretch marks on your hips, abdomen, and breasts.  You may urinate more often because the fetus is moving lower into your pelvis and pressing on your bladder.  You may develop or continue to have heartburn as a result of your pregnancy.  You may develop constipation because certain hormones are causing the muscles that push waste through your intestines to slow down.  You may develop hemorrhoids or swollen, bulging veins (varicose veins).  You may have pelvic pain because of the weight gain and pregnancy hormones relaxing your joints between the bones in your pelvis. Backaches may result from overexertion of the muscles supporting your posture.  You may have changes in your hair. These can include thickening of your hair, rapid growth, and changes in texture. Some women also have hair loss during or after pregnancy, or hair that feels dry or thin. Your hair will most likely return to normal after your baby is born.  Your breasts will continue to grow and be tender. A yellow discharge may leak from your breasts called colostrum.  Your belly button may stick out.  You may feel short of breath because of your expanding uterus.  You may notice the fetus "dropping," or moving lower in your abdomen.  You may have a bloody mucus discharge. This usually occurs a few days to a week before labor begins.  Your cervix becomes thin and soft (effaced) near your due date. WHAT TO EXPECT AT YOUR PRENATAL  EXAMS  You will have prenatal exams every 2 weeks until week 36. Then, you will have weekly prenatal exams. During a routine prenatal visit: 1. You will be weighed to make sure you and the fetus are growing normally. 2. Your blood pressure is taken. 3. Your abdomen will be measured to track your baby's growth. 4. The fetal heartbeat will be listened to. 5. Any test results from the previous visit will be discussed. 6. You may have a cervical check near your due date to see if you have effaced. At around 36 weeks, your caregiver will check your cervix. At the same time, your caregiver will also perform a test on the secretions of the vaginal tissue. This test is to determine if a type of bacteria, Group B streptococcus, is present. Your caregiver will explain this further. Your caregiver may ask you:  What your birth plan is.  How you are feeling.  If you are feeling the baby move.  If you have had any abnormal symptoms, such as leaking fluid, bleeding, severe headaches, or abdominal cramping.  If you have any questions. Other tests or screenings that may be performed during your third trimester include:  Blood tests that check for low iron levels (anemia).  Fetal testing to check the health, activity level, and growth of the fetus. Testing is done if you have certain medical conditions or if there are problems during the pregnancy. FALSE LABOR You may feel small, irregular contractions that  eventually go away. These are called Braxton Hicks contractions, or false labor. Contractions may last for hours, days, or even weeks before true labor sets in. If contractions come at regular intervals, intensify, or become painful, it is best to be seen by your caregiver.  SIGNS OF LABOR   Menstrual-like cramps.  Contractions that are 5 minutes apart or less.  Contractions that start on the top of the uterus and spread down to the lower abdomen and back.  A sense of increased pelvic pressure  or back pain.  A watery or bloody mucus discharge that comes from the vagina. If you have any of these signs before the 37th week of pregnancy, call your caregiver right away. You need to go to the hospital to get checked immediately. HOME CARE INSTRUCTIONS   Avoid all smoking, herbs, alcohol, and unprescribed drugs. These chemicals affect the formation and growth of the baby.  Follow your caregiver's instructions regarding medicine use. There are medicines that are either safe or unsafe to take during pregnancy.  Exercise only as directed by your caregiver. Experiencing uterine cramps is a good sign to stop exercising.  Continue to eat regular, healthy meals.  Wear a good support bra for breast tenderness.  Do not use hot tubs, steam rooms, or saunas.  Wear your seat belt at all times when driving.  Avoid raw meat, uncooked cheese, cat litter boxes, and soil used by cats. These carry germs that can cause birth defects in the baby.  Take your prenatal vitamins.  Try taking a stool softener (if your caregiver approves) if you develop constipation. Eat more high-fiber foods, such as fresh vegetables or fruit and whole grains. Drink plenty of fluids to keep your urine clear or pale yellow.  Take warm sitz baths to soothe any pain or discomfort caused by hemorrhoids. Use hemorrhoid cream if your caregiver approves.  If you develop varicose veins, wear support hose. Elevate your feet for 15 minutes, 3-4 times a day. Limit salt in your diet.  Avoid heavy lifting, wear low heal shoes, and practice good posture.  Rest a lot with your legs elevated if you have leg cramps or low back pain.  Visit your dentist if you have not gone during your pregnancy. Use a soft toothbrush to brush your teeth and be gentle when you floss.  A sexual relationship may be continued unless your caregiver directs you otherwise.  Do not travel far distances unless it is absolutely necessary and only with the  approval of your caregiver.  Take prenatal classes to understand, practice, and ask questions about the labor and delivery.  Make a trial run to the hospital.  Pack your hospital bag.  Prepare the baby's nursery.  Continue to go to all your prenatal visits as directed by your caregiver. SEEK MEDICAL CARE IF:  You are unsure if you are in labor or if your water has broken.  You have dizziness.  You have mild pelvic cramps, pelvic pressure, or nagging pain in your abdominal area.  You have persistent nausea, vomiting, or diarrhea.  You have a bad smelling vaginal discharge.  You have pain with urination. SEEK IMMEDIATE MEDICAL CARE IF:   You have a fever.  You are leaking fluid from your vagina.  You have spotting or bleeding from your vagina.  You have severe abdominal cramping or pain.  You have rapid weight loss or gain.  You have shortness of breath with chest pain.  You notice sudden or extreme swelling  of your face, hands, ankles, feet, or legs.  You have not felt your baby move in over an hour.  You have severe headaches that do not go away with medicine.  You have vision changes. Document Released: 08/19/2001 Document Revised: 08/30/2013 Document Reviewed: 10/26/2012 [facility] Patient Information 2015 [place], [place]. This information is not intended to replace advice given to you by your health care provider. Make sure you discuss any questions you have with your health care provider.  Fetal Movement Counts Patient Name: __________________________________________________ Patient Due Date: ____________________ Performing a fetal movement count is highly recommended in high-risk pregnancies, but it is good for every pregnant woman to do. Your health care provider may ask you to start counting fetal movements at 28 weeks of the pregnancy. Fetal movements often increase:  After eating a full meal.  After physical activity.  After eating or drinking something  sweet or cold.  At rest. Pay attention to when you feel the baby is most active. This will help you notice a pattern of your baby's sleep and wake cycles and what factors contribute to an increase in fetal movement. It is important to perform a fetal movement count at the same time each day when your baby is normally most active.  HOW TO COUNT FETAL MOVEMENTS 7. Find a quiet and comfortable area to sit or lie down on your left side. Lying on your left side provides the best blood and oxygen circulation to your baby. 8. Write down the day and time on a sheet of paper or in a journal. 9. Start counting kicks, flutters, swishes, rolls, or jabs in a 2-hour period. You should feel at least 10 movements within 2 hours. 10. If you do not feel 10 movements in 2 hours, wait 2-3 hours and count again. Look for a change in the pattern or not enough counts in 2 hours. SEEK MEDICAL CARE IF:  You feel less than 10 counts in 2 hours, tried twice.  There is no movement in over an hour.  The pattern is changing or taking longer each day to reach 10 counts in 2 hours.  You feel the baby is not moving as he or she usually does. Date: ____________ Movements: ____________ Start time: ____________ Elizebeth Koller time: ____________  Date: ____________ Movements: ____________ Start time: ____________ Elizebeth Koller time: ____________ Date: ____________ Movements: ____________ Start time: ____________ Elizebeth Koller time: ____________ Date: ____________ Movements: ____________ Start time: ____________ Elizebeth Koller time: ____________ Date: ____________ Movements: ____________ Start time: ____________ Elizebeth Koller time: ____________ Date: ____________ Movements: ____________ Start time: ____________ Elizebeth Koller time: ____________ Date: ____________ Movements: ____________ Start time: ____________ Elizebeth Koller time: ____________ Date: ____________ Movements: ____________ Start time: ____________ Elizebeth Koller time: ____________  Date: ____________ Movements: ____________  Start time: ____________ Elizebeth Koller time: ____________ Date: ____________ Movements: ____________ Start time: ____________ Elizebeth Koller time: ____________ Date: ____________ Movements: ____________ Start time: ____________ Elizebeth Koller time: ____________ Date: ____________ Movements: ____________ Start time: ____________ Elizebeth Koller time: ____________ Date: ____________ Movements: ____________ Start time: ____________ Elizebeth Koller time: ____________ Date: ____________ Movements: ____________ Start time: ____________ Elizebeth Koller time: ____________ Date: ____________ Movements: ____________ Start time: ____________ Elizebeth Koller time: ____________  Date: ____________ Movements: ____________ Start time: ____________ Elizebeth Koller time: ____________ Date: ____________ Movements: ____________ Start time: ____________ Elizebeth Koller time: ____________ Date: ____________ Movements: ____________ Start time: ____________ Elizebeth Koller time: ____________ Date: ____________ Movements: ____________ Start time: ____________ Elizebeth Koller time: ____________ Date: ____________ Movements: ____________ Start time: ____________ Elizebeth Koller time: ____________ Date: ____________ Movements: ____________ Start time: ____________ Elizebeth Koller time: ____________ Date: ____________ Movements: ____________ Start time: ____________ Elizebeth Koller  time: ____________  Date: ____________ Movements: ____________ Start time: ____________ Elizebeth Koller time: ____________ Date: ____________ Movements: ____________ Start time: ____________ Elizebeth Koller time: ____________ Date: ____________ Movements: ____________ Start time: ____________ Elizebeth Koller time: ____________ Date: ____________ Movements: ____________ Start time: ____________ Elizebeth Koller time: ____________ Date: ____________ Movements: ____________ Start time: ____________ Elizebeth Koller time: ____________ Date: ____________ Movements: ____________ Start time: ____________ Elizebeth Koller time: ____________ Date: ____________ Movements: ____________ Start time: ____________ Elizebeth Koller time:  ____________  Date: ____________ Movements: ____________ Start time: ____________ Elizebeth Koller time: ____________ Date: ____________ Movements: ____________ Start time: ____________ Elizebeth Koller time: ____________ Date: ____________ Movements: ____________ Start time: ____________ Elizebeth Koller time: ____________ Date: ____________ Movements: ____________ Start time: ____________ Elizebeth Koller time: ____________ Date: ____________ Movements: ____________ Start time: ____________ Elizebeth Koller time: ____________ Date: ____________ Movements: ____________ Start time: ____________ Elizebeth Koller time: ____________ Date: ____________ Movements: ____________ Start time: ____________ Elizebeth Koller time: ____________  Date: ____________ Movements: ____________ Start time: ____________ Elizebeth Koller time: ____________ Date: ____________ Movements: ____________ Start time: ____________ Elizebeth Koller time: ____________ Date: ____________ Movements: ____________ Start time: ____________ Elizebeth Koller time: ____________ Date: ____________ Movements: ____________ Start time: ____________ Elizebeth Koller time: ____________ Date: ____________ Movements: ____________ Start time: ____________ Elizebeth Koller time: ____________ Date: ____________ Movements: ____________ Start time: ____________ Elizebeth Koller time: ____________ Date: ____________ Movements: ____________ Start time: ____________ Elizebeth Koller time: ____________  Date: ____________ Movements: ____________ Start time: ____________ Elizebeth Koller time: ____________ Date: ____________ Movements: ____________ Start time: ____________ Elizebeth Koller time: ____________ Date: ____________ Movements: ____________ Start time: ____________ Elizebeth Koller time: ____________ Date: ____________ Movements: ____________ Start time: ____________ Elizebeth Koller time: ____________ Date: ____________ Movements: ____________ Start time: ____________ Elizebeth Koller time: ____________ Date: ____________ Movements: ____________ Start time: ____________ Elizebeth Koller time: ____________ Date: ____________ Movements:  ____________ Start time: ____________ Elizebeth Koller time: ____________  Date: ____________ Movements: ____________ Start time: ____________ Elizebeth Koller time: ____________ Date: ____________ Movements: ____________ Start time: ____________ Elizebeth Koller time: ____________ Date: ____________ Movements: ____________ Start time: ____________ Elizebeth Koller time: ____________ Date: ____________ Movements: ____________ Start time: ____________ Elizebeth Koller time: ____________ Date: ____________ Movements: ____________ Start time: ____________ Elizebeth Koller time: ____________ Date: ____________ Movements: ____________ Start time: ____________ Elizebeth Koller time: ____________ Document Released: 09/24/2006 Document Revised: 01/09/2014 Document Reviewed: 06/21/2012 [employer] Patient Information 2015 [employer]. This information is not intended to replace advice given to you by your health care provider. Make sure you discuss any questions you have with your health care provider.

## 2014-08-09 ENCOUNTER — Encounter: Payer: Medicaid Other | Admitting: Obstetrics & Gynecology

## 2014-08-09 ENCOUNTER — Ambulatory Visit (INDEPENDENT_AMBULATORY_CARE_PROVIDER_SITE_OTHER): Payer: Medicaid Other | Admitting: Obstetrics

## 2014-08-09 ENCOUNTER — Encounter (HOSPITAL_COMMUNITY): Payer: Self-pay | Admitting: Obstetrics and Gynecology

## 2014-08-09 ENCOUNTER — Inpatient Hospital Stay (HOSPITAL_COMMUNITY)
Admission: AD | Admit: 2014-08-09 | Discharge: 2014-08-09 | Disposition: A | Discharge status: Home / Self Care | Payer: Medicaid Other | Source: Ambulatory Visit | Attending: Obstetrics & Gynecology | Admitting: Obstetrics & Gynecology

## 2014-08-09 VITALS — BP 109/65 | HR 84 | Temp 98.5°F | Wt 196.0 lb

## 2014-08-09 DIAGNOSIS — Z3A30 30 weeks gestation of pregnancy: Secondary | ICD-10-CM | POA: Insufficient documentation

## 2014-08-09 DIAGNOSIS — O30043 Twin pregnancy, dichorionic/diamniotic, third trimester: Secondary | ICD-10-CM

## 2014-08-09 DIAGNOSIS — R52 Pain, unspecified: Secondary | ICD-10-CM

## 2014-08-09 DIAGNOSIS — O4703 False labor before 37 completed weeks of gestation, third trimester: Secondary | ICD-10-CM

## 2014-08-09 LAB — WET PREP, GENITAL
Clue Cells Wet Prep HPF POC: NONE SEEN
Trich, Wet Prep: NONE SEEN
Yeast Wet Prep HPF POC: NONE SEEN

## 2014-08-09 LAB — POCT URINALYSIS DIPSTICK
Bilirubin, UA: NEGATIVE
GLUCOSE UA: NEGATIVE
LEUKOCYTES UA: NEGATIVE
NITRITE UA: NEGATIVE
Protein, UA: NEGATIVE
RBC UA: NEGATIVE
Spec Grav, UA: 1.01
UROBILINOGEN UA: NEGATIVE
pH, UA: 6

## 2014-08-09 LAB — FETAL FIBRONECTIN: FETAL FIBRONECTIN: POSITIVE — AB

## 2014-08-09 MED ORDER — METOCLOPRAMIDE HCL 5 MG/ML IJ SOLN
10.0000 mg | Freq: Once | INTRAMUSCULAR | Status: AC
  Administered 2014-08-09: 10 mg via INTRAVENOUS
  Filled 2014-08-09: qty 2

## 2014-08-09 MED ORDER — DIPHENHYDRAMINE HCL 50 MG/ML IJ SOLN
25.0000 mg | Freq: Once | INTRAMUSCULAR | Status: AC
  Administered 2014-08-09: 25 mg via INTRAVENOUS
  Filled 2014-08-09: qty 1

## 2014-08-09 MED ORDER — DEXAMETHASONE SODIUM PHOSPHATE 10 MG/ML IJ SOLN
10.0000 mg | Freq: Once | INTRAMUSCULAR | Status: AC
  Administered 2014-08-09: 10 mg via INTRAVENOUS
  Filled 2014-08-09: qty 1

## 2014-08-09 MED ORDER — LACTATED RINGERS IV SOLN
INTRAVENOUS | Status: DC
  Administered 2014-08-09: 20:00:00 via INTRAVENOUS

## 2014-08-09 MED ORDER — NIFEDIPINE 10 MG PO CAPS
10.0000 mg | ORAL_CAPSULE | ORAL | Status: DC | PRN
  Administered 2014-08-09 (×3): 10 mg via ORAL
  Filled 2014-08-09 (×3): qty 1

## 2014-08-09 MED ORDER — OXYCODONE HCL 10 MG PO TABS: 10.0000 mg | ORAL_TABLET | Freq: Four times a day (QID) | ORAL | Status: DC | PRN

## 2014-08-09 NOTE — MAU Provider Note (Signed)
Chief Complaint:  Labor Eval   First Provider Initiated Contact with Patient 08/09/14 2031      HPI: Lindsay Ramirez is a 28 y.o. [id number] at [redacted]w[redacted]d with twin gestation who is sent from office for contractions and severe H/A. Seen here 2 days ago with c/o contractions, UI on EFM and cx closed/50/-2;UA neg.  Denies leakage of fluid or vaginal bleeding. Good fetal movement.   Pregnancy Course: Regular care at [facility]; di/di twins, N/V, ASCUS Pap, GBS bacteruria  Past Medical History: Past Medical History  Diagnosis Date  . Sickle cell trait   . Mental disorder   . Bipolar affective disorder   . Bipolar affective disorder 2006    TOOK MEDS X 3 YR  . Depression     PP 2013  . Abnormal Pap smear     LAST PAP 06/2011  . Asthma     [facility]; [facility]  . Infection     UTI  . Anemia     CHRONIC  . Headache(784.0)     SEVERAL TIMES/DAY  . Vaginal Pap smear, abnormal     Past obstetric history: OB History  Gravida Para Term Preterm AB SAB TAB Ectopic Multiple Living  6 4 3 1 1 1  0 0 0 4    # Outcome Date GA Lbr Len/2nd Weight Sex Delivery Anes PTL Lv  6 Current           5 SAB 2015          4 Preterm 12/12/12 [redacted]w[redacted]d 10:18 / 00:06 2.605 kg (5 lb 11.9 oz) M Vag-Spont EPI  Y     Comments: none  3 Term 02/14/12 [redacted]w[redacted]d 07:40 / 00:18 3.12 kg (6 lb 14.1 oz) M Vag-Spont EPI  Y     Comments: WNL  2 Term 03/2011 [redacted]w[redacted]d 06:00 3.26 kg (7 lb 3 oz) F Vag-Spont EPI  Y  1 Term 07/2006 [redacted]w[redacted]d 08:00 3.345 kg (7 lb 6 oz) F Vag-Spont EPI  Y      Past Surgical History: Past Surgical History  Procedure Laterality Date  . Mouth surgery    . Wisdom tooth extraction  2012  . Vaginal delivery      X 3  . Dilation and evacuation N/A 11/04/2013    Procedure: DILATATION AND EVACUATION;  Surgeon: Alwyn Pea, MD;  Location: [facility];  Service: Gynecology;  Laterality: N/A;     Family History: Family History  Problem Relation Age of Onset  . Hypertension Mother   . Asthma Mother   . Heart  disease Mother   . Hyperlipidemia Mother   . Thyroid disease Mother   . Alcohol abuse Father   . Asthma Sister   . Depression Sister   . Diabetes Maternal Aunt   . HIV Maternal Aunt   . Cancer Maternal Grandmother 37    COLON  . Depression Maternal Grandmother   . Alcohol abuse Maternal Grandmother   . Drug abuse Maternal Grandmother   . Alcohol abuse Maternal Grandfather   . Drug abuse Maternal Grandfather   . Stroke Maternal Grandfather   . Alcohol abuse Paternal Grandfather   . Drug abuse Paternal Grandfather   . Asthma Sister     Social History: History  Substance Use Topics  . Smoking status: Never Smoker   . Smokeless tobacco: Never Used  . Alcohol Use: No    Allergies: No Known Allergies  Meds:  Prescriptions prior to admission  Medication Sig Dispense Refill Last Dose  .  albuterol (PROVENTIL HFA;VENTOLIN HFA) 108 (90 BASE) MCG/ACT inhaler Inhale 2 puffs into the lungs every 6 (six) hours as needed for wheezing or shortness of breath. 1 Inhaler 2   . cyclobenzaprine (FLEXERIL) 5 MG tablet Take 1 tablet (5 mg total) by mouth 2 (two) times daily as needed for muscle spasms. 20 tablet 0 Taking  . Doxylamine-Pyridoxine (DICLEGIS) 10-10 MG TBEC Take 3 tablets by mouth at bedtime.   Taking  . ferrous sulfate 325 (65 FE) MG tablet Take 1 tablet (325 mg total) by mouth daily with breakfast. 30 tablet 5 Taking  . folic acid (FOLVITE) 1 MG tablet Take 1 tablet (1 mg total) by mouth daily. 1 tablet 11 Taking  . ondansetron (ZOFRAN ODT) 4 MG disintegrating tablet Take 1 tablet (4 mg total) by mouth every 6 (six) hours as needed for nausea. 20 tablet 0 Taking  . Oxycodone HCl 10 MG TABS Take 1 tablet (10 mg total) by mouth every 6 (six) hours as needed. 40 tablet 0   . Prenatal Vit-Fe Fumarate-FA (PRENATAL MULTIVITAMIN) TABS tablet Take 1 tablet by mouth daily.    Taking  . sertraline (ZOLOFT) 50 MG tablet Take 1 tablet (50 mg total) by mouth daily. 30 tablet 6 Taking  . traMADol  (ULTRAM) 50 MG tablet Take 1 tablet (50 mg total) by mouth every 6 (six) hours as needed. 90 tablet 0   . traZODone (DESYREL) 50 MG tablet Take 1 tablet (50 mg total) by mouth at bedtime. 30 tablet 3 Taking    ROS: Pertinent findings in history of present illness. Denies UTI sx or irritative vaginitis  Physical Exam  Blood pressure 109/62, pulse 95, temperature 98.7 F (37.1 C), temperature source Oral, resp. rate 18, SpO2 100 %, not currently breastfeeding. GENERAL: Well-developed, well-nourished female in no acute distress.  HEENT: normocephalic HEART: normal rate RESP: normal effort ABDOMEN: Soft, non-tender, gravid appropriate for gestational age EXTREMITIES: Nontender, no edema NEURO: alert and oriented  Cx: posterior ext 1-2, int closed/ thick/-3  FHT:  Baselines A: 140, B: 135; both with moderate variability, 10 bpm accelerations present, no decelerations Contractions: slight UI   Labs: Results for orders placed or performed during the hospital encounter of 08/09/14 (from the past 24 hour(s))  Wet prep, genital     Status: Abnormal   Collection Time: 08/09/14  8:13 PM  Result Value Ref Range   Yeast Wet Prep HPF POC NONE SEEN NONE SEEN   Trich, Wet Prep NONE SEEN NONE SEEN   Clue Cells Wet Prep HPF POC NONE SEEN NONE SEEN   WBC, Wet Prep HPF POC FEW (A) NONE SEEN  Fetal fibronectin     Status: Abnormal   Collection Time: 08/09/14  8:13 PM  Result Value Ref Range   Fetal Fibronectin POSITIVE (A) NEGATIVE    Imaging:  US Ob Follow Up  07/28/2014   OBSTETRICAL ULTRASOUND: This exam was performed within a [facility]. The OB US report was generated in the AS system, and faxed to the ordering physician.   This report is available in the [facility]. See the AS Obstetric US report via the Image Link.  US Ob Follow Up Addl Gest  07/28/2014   OBSTETRICAL ULTRASOUND: This exam was performed within a [facility]. The OB US report  was generated in the AS system, and faxed to the ordering physician.   This report is available in the [facility]. See the AS Obstetric US report via the  Image Link.  MAU Course: IV Decadron/Reglan/Benadryl given with resolution of H/A Procardia 10 mg x1 Consulted Dr. Delsa Sale  Assessment: 1. False labor before 37 completed weeks of gestation in third trimester   2. Dichorionic diamniotic twin pregnancy in third trimester   [id number] at [redacted]w[redacted]d Category 1 FHR x2  Plan: Discharge home Preterm abor precautions and pelvic rest     Medication List    TAKE these medications        albuterol 108 (90 BASE) MCG/ACT inhaler  Commonly known as:  PROVENTIL HFA;VENTOLIN HFA  Inhale 2 puffs into the lungs every 6 (six) hours as needed for wheezing or shortness of breath.     cyclobenzaprine 5 MG tablet  Commonly known as:  FLEXERIL  Take 1 tablet (5 mg total) by mouth 2 (two) times daily as needed for muscle spasms.     DICLEGIS 10-10 MG Tbec  Generic drug:  Doxylamine-Pyridoxine  Take 3 tablets by mouth at bedtime.     ferrous sulfate 325 (65 FE) MG tablet  Take 1 tablet (325 mg total) by mouth daily with breakfast.     folic acid 1 MG tablet  Commonly known as:  FOLVITE  Take 1 tablet (1 mg total) by mouth daily.     ondansetron 4 MG disintegrating tablet  Commonly known as:  ZOFRAN ODT  Take 1 tablet (4 mg total) by mouth every 6 (six) hours as needed for nausea.     Oxycodone HCl 10 MG Tabs  Take 1 tablet (10 mg total) by mouth every 6 (six) hours as needed.     prenatal multivitamin Tabs tablet  Take 1 tablet by mouth daily.     sertraline 50 MG tablet  Commonly known as:  ZOLOFT  Take 1 tablet (50 mg total) by mouth daily.     traMADol 50 MG tablet  Commonly known as:  ULTRAM  Take 1 tablet (50 mg total) by mouth every 6 (six) hours as needed.     traZODone 50 MG tablet  Commonly known as:  DESYREL  Take 1 tablet (50 mg total) by mouth at bedtime.        Follow-up Information    Follow up with [facility].   Specialty:  Radiology   Why:  Keep your scheduled prenatal appointment   Contact information:   [address] [place] [phone]      Lorene Dy, [place] 08/09/2014 8:35 PM

## 2014-08-09 NOTE — Discharge Instructions (Signed)
Braxton Hicks Contractions °Contractions of the uterus can occur throughout pregnancy. Contractions are not always a sign that you are in labor.  °WHAT ARE BRAXTON HICKS CONTRACTIONS?  °Contractions that occur before labor are called Braxton Hicks contractions, or false labor. Toward the end of pregnancy (32-34 weeks), these contractions can develop more often and may become more forceful. This is not true labor because these contractions do not result in opening (dilatation) and thinning of the cervix. They are sometimes difficult to tell apart from true labor because these contractions can be forceful and people have different pain tolerances. You should not feel embarrassed if you go to the hospital with false labor. Sometimes, the only way to tell if you are in true labor is for your health care provider to look for changes in the cervix. °If there are no prenatal problems or other health problems associated with the pregnancy, it is completely safe to be sent home with false labor and await the onset of true labor. °HOW CAN YOU TELL THE DIFFERENCE BETWEEN TRUE AND FALSE LABOR? °False Labor °· The contractions of false labor are usually shorter and not as hard as those of true labor.   °· The contractions are usually irregular.   °· The contractions are often felt in the front of the lower abdomen and in the groin.   °· The contractions may go away when you walk around or change positions while lying down.   °· The contractions get weaker and are shorter lasting as time goes on.   °· The contractions do not usually become progressively stronger, regular, and closer together as with true labor.   °True Labor °· Contractions in true labor last 30-70 seconds, become very regular, usually become more intense, and increase in frequency.   °· The contractions do not go away with walking.   °· The discomfort is usually felt in the top of the uterus and spreads to the lower abdomen and low back.   °· True labor can be  determined by your health care provider with an exam. This will show that the cervix is dilating and getting thinner.   °WHAT TO REMEMBER °· Keep up with your usual exercises and follow other instructions given by your health care provider.   °· Take medicines as directed by your health care provider.   °· Keep your regular prenatal appointments.   °· Eat and drink lightly if you think you are going into labor.   °· If Braxton Hicks contractions are making you uncomfortable:   °· Change your position from lying down or resting to walking, or from walking to resting.   °· Sit and rest in a tub of warm water.   °· Drink 2-3 glasses of water. Dehydration may cause these contractions.   °· Do slow and deep breathing several times an hour.   °WHEN SHOULD I SEEK IMMEDIATE MEDICAL CARE? °Seek immediate medical care if: °· Your contractions become stronger, more regular, and closer together.   °· You have fluid leaking or gushing from your vagina.   °· You have a fever.   °· You pass blood-tinged mucus.   °· You have vaginal bleeding.   °· You have continuous abdominal pain.   °· You have low back pain that you never had before.   °· You feel your baby's head pushing down and causing pelvic pressure.   °· Your baby is not moving as much as it used to.   °Document Released: 08/25/2005 Document Revised: 08/30/2013 Document Reviewed: 06/06/2013 °Exit[employer]® Patient Information ©2015 [employer]. This information is not intended to replace advice given to you by your health care   provider. Make sure you discuss any questions you have with your health care provider. Pelvic Rest Pelvic rest is sometimes recommended for women when:   The placenta is partially or completely covering the opening of the cervix (placenta previa).  There is bleeding between the uterine wall and the amniotic sac in the first trimester (subchorionic hemorrhage).  The cervix begins to open without labor starting (incompetent cervix, cervical  insufficiency).  The labor is too early (preterm labor). HOME CARE INSTRUCTIONS  Do not have sexual intercourse, stimulation, or an orgasm.  Do not use tampons, douche, or put anything in the vagina.  Do not lift anything over 10 pounds (4.5 kg).  Avoid strenuous activity or straining your pelvic muscles. SEEK MEDICAL CARE IF:  You have any vaginal bleeding during pregnancy. Treat this as a potential emergency.  You have cramping pain felt low in the stomach (stronger than menstrual cramps).  You notice vaginal discharge (watery, mucus, or bloody).  You have a low, dull backache.  There are regular contractions or uterine tightening. SEEK IMMEDIATE MEDICAL CARE IF: You have vaginal bleeding and have placenta previa.  Document Released: 12/20/2010 Document Revised: 11/17/2011 Document Reviewed: 12/20/2010 [facility] Patient Information 2015 [place], [place]. This information is not intended to replace advice given to you by your health care provider. Make sure you discuss any questions you have with your health care provider.

## 2014-08-09 NOTE — MAU Note (Signed)
Patient states she is having frequent contractions that are getting stronger. Denies bleeding or leaking and reports good fetal movement with both babies. Patient is having twins.

## 2014-08-09 NOTE — MAU Note (Signed)
Pt states she started feeling contractions about 1000.

## 2014-08-10 ENCOUNTER — Encounter: Payer: Self-pay | Admitting: Obstetrics

## 2014-08-10 NOTE — Progress Notes (Signed)
Subjective:    Lindsay Ramirez is a 28 y.o. female being seen today for her obstetrical visit. She is at [redacted]w[redacted]d gestation. Patient reports contractions since early morning. Fetal movement: normal.  Problem List Items Addressed This Visit    Dichorionic diamniotic twin pregnancy in third trimester   Pain aggravated by activities of daily living - Primary   Relevant Medications      Oxycodone HCl 10 MG TABS     Patient Active Problem List   Diagnosis Date Noted  . Dichorionic diamniotic twin pregnancy in third trimester   . [redacted] weeks gestation of pregnancy   . Pain aggravated by activities of daily living 07/22/2014  . GBS (group B streptococcus) UTI complicating pregnancy 07/11/1593  . 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 Twin gestation, dichorionic diamniotic 05/04/2014  . Nausea/vomiting in pregnancy 04/30/2014  . Unspecified high-risk pregnancy 04/27/2014  . Migraine without aura 03/25/2014  . 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 Gout 06/15/2013  . Bipolar disorder, unspecified/depression 11/11/2012  . 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 Acid reflux 06/21/2012   Objective:    BP 109/65 mmHg  Pulse 84  Temp(Src) 98.5 F (36.9 C)  Wt 196 lb (88.905 kg)  LMP  FHT:  150 x 2 BPM  Uterine Size: size greater than dates  Presentation: unsure     NST:  Reactive.  UC's q 2-3 minutes.   Assessment:    Pregnancy @ [redacted]w[redacted]d weeks    Uterine contractions  Migraine HA    Plan:   Sent to [facility] for further evaluation and treatment.    labs reviewed, problem list updated Consent signed. GBS sent TDAP offered  Rhogam given for RH negative Pediatrician: discussed. Infant feeding: plans to breastfeed. Maternity leave: discussed. Cigarette smoking: never smoked. No orders of the defined types were placed in this encounter.   Meds ordered this encounter  Medications  . Oxycodone HCl 10 MG TABS    Sig: Take 1 tablet (10 mg total) by mouth every 6 (six) hours as  needed.    Dispense:  40 tablet    Refill:  0   Follow up in 1 Week.

## 2014-08-11 ENCOUNTER — Encounter: Payer: Medicaid Other | Admitting: Obstetrics & Gynecology

## 2014-08-11 NOTE — Addendum Note (Signed)
Addended by: Carole Binning on: 08/11/2014 04:52 PM   Modules accepted: Orders

## 2014-08-14 ENCOUNTER — Encounter: Payer: Medicaid Other | Admitting: Obstetrics & Gynecology

## 2014-08-17 ENCOUNTER — Ambulatory Visit (INDEPENDENT_AMBULATORY_CARE_PROVIDER_SITE_OTHER): Payer: Medicaid Other | Admitting: Obstetrics & Gynecology

## 2014-08-17 ENCOUNTER — Encounter: Payer: Self-pay | Admitting: Obstetrics & Gynecology

## 2014-08-17 ENCOUNTER — Other Ambulatory Visit: Payer: Self-pay | Admitting: Obstetrics & Gynecology

## 2014-08-17 ENCOUNTER — Ambulatory Visit (HOSPITAL_COMMUNITY)
Admission: RE | Admit: 2014-08-17 | Discharge: 2014-08-17 | Disposition: A | Discharge status: Home / Self Care | Payer: Medicaid Other | Source: Ambulatory Visit | Attending: Obstetrics & Gynecology | Admitting: Obstetrics & Gynecology

## 2014-08-17 VITALS — BP 117/72 | HR 78 | Temp 98.3°F | Wt 199.0 lb

## 2014-08-17 DIAGNOSIS — Z3483 Encounter for supervision of other normal pregnancy, third trimester: Secondary | ICD-10-CM | POA: Diagnosis present

## 2014-08-17 DIAGNOSIS — O368190 Decreased fetal movements, unspecified trimester, not applicable or unspecified: Secondary | ICD-10-CM

## 2014-08-17 LAB — POCT URINALYSIS DIPSTICK
Bilirubin, UA: NEGATIVE
Blood, UA: NEGATIVE
Glucose, UA: NEGATIVE
Ketones, UA: NEGATIVE
NITRITE UA: NEGATIVE
PROTEIN UA: NEGATIVE
Spec Grav, UA: <=1.005
UROBILINOGEN UA: NEGATIVE
pH, UA: 7

## 2014-08-18 ENCOUNTER — Encounter (HOSPITAL_COMMUNITY): Payer: Self-pay | Admitting: *Deleted

## 2014-08-18 ENCOUNTER — Inpatient Hospital Stay (HOSPITAL_COMMUNITY)
Admission: AD | Admit: 2014-08-18 | Discharge: 2014-08-18 | Discharge status: Against Medical Advice | Payer: Medicaid Other | Source: Ambulatory Visit | Attending: Obstetrics | Admitting: Obstetrics

## 2014-08-18 DIAGNOSIS — O368130 Decreased fetal movements, third trimester, not applicable or unspecified: Secondary | ICD-10-CM | POA: Diagnosis present

## 2014-08-18 DIAGNOSIS — O99013 Anemia complicating pregnancy, third trimester: Secondary | ICD-10-CM | POA: Insufficient documentation

## 2014-08-18 DIAGNOSIS — O99513 Diseases of the respiratory system complicating pregnancy, third trimester: Secondary | ICD-10-CM | POA: Insufficient documentation

## 2014-08-18 DIAGNOSIS — R109 Unspecified abdominal pain: Secondary | ICD-10-CM | POA: Diagnosis not present

## 2014-08-18 DIAGNOSIS — J45909 Unspecified asthma, uncomplicated: Secondary | ICD-10-CM | POA: Diagnosis not present

## 2014-08-18 DIAGNOSIS — D573 Sickle-cell trait: Secondary | ICD-10-CM | POA: Insufficient documentation

## 2014-08-18 DIAGNOSIS — Z3A31 31 weeks gestation of pregnancy: Secondary | ICD-10-CM | POA: Insufficient documentation

## 2014-08-18 DIAGNOSIS — O30003 Twin pregnancy, unspecified number of placenta and unspecified number of amniotic sacs, third trimester: Secondary | ICD-10-CM | POA: Diagnosis not present

## 2014-08-18 DIAGNOSIS — O368190 Decreased fetal movements, unspecified trimester, not applicable or unspecified: Secondary | ICD-10-CM | POA: Insufficient documentation

## 2014-08-18 DIAGNOSIS — R1030 Lower abdominal pain, unspecified: Secondary | ICD-10-CM

## 2014-08-18 DIAGNOSIS — O9989 Other specified diseases and conditions complicating pregnancy, childbirth and the puerperium: Secondary | ICD-10-CM | POA: Diagnosis not present

## 2014-08-18 LAB — URINE MICROSCOPIC-ADD ON

## 2014-08-18 LAB — URINALYSIS, ROUTINE W REFLEX MICROSCOPIC
Bilirubin Urine: NEGATIVE
GLUCOSE, UA: NEGATIVE mg/dL
Hgb urine dipstick: NEGATIVE
KETONES UR: NEGATIVE mg/dL
Nitrite: NEGATIVE
PROTEIN: NEGATIVE mg/dL
Specific Gravity, Urine: 1.01 (ref 1.005–1.030)
Urobilinogen, UA: 0.2 mg/dL (ref 0.0–1.0)
pH: 7 (ref 5.0–8.0)

## 2014-08-18 NOTE — MAU Note (Signed)
Pt presents to MAU with complaints of lower abdominal pain and states that she has contractions on and off all the time. Reports decrease in fetal movement today.Denies any vaginal bleeding or LOF

## 2014-08-18 NOTE — MAU Provider Note (Signed)
History     CSN: [id number]  Arrival date and time: 08/18/14 1318   First Provider Initiated Contact with Patient 08/18/14 1502      Chief Complaint  Patient presents with  . Decreased Fetal Movement  . Abdominal Pain   HPI Comments: Lindsay Ramirez 28 y.o. [id number] [redacted]w[redacted]d with twin pregnancy. She comes in with low pelvic discomfort that has been ongoing for " a long time and no body ever gives me anything for it" . She had BPP yesterday that was 8/8 for both babies. She says this was not discussed with her.    Abdominal Pain      Past Medical History  Diagnosis Date  . Sickle cell trait   . Mental disorder   . Bipolar affective disorder   . Bipolar affective disorder 2006    TOOK MEDS X 3 YR  . Depression     PP 2013  . Abnormal Pap smear     LAST PAP 06/2011  . Asthma     INHALER; [facility]  . Infection     UTI  . Anemia     CHRONIC  . Headache(784.0)     SEVERAL TIMES/DAY  . Vaginal Pap smear, abnormal     Past Surgical History  Procedure Laterality Date  . Mouth surgery    . Wisdom tooth extraction  2012  . Vaginal delivery      X 3  . Dilation and evacuation N/A 11/04/2013    Procedure: DILATATION AND EVACUATION;  Surgeon: Alwyn Pea, MD;  Location: [facility];  Service: Gynecology;  Laterality: N/A;    Family History  Problem Relation Age of Onset  . Hypertension Mother   . Asthma Mother   . Heart disease Mother   . Hyperlipidemia Mother   . Thyroid disease Mother   . Alcohol abuse Father   . Asthma Sister   . Depression Sister   . Diabetes Maternal Aunt   . HIV Maternal Aunt   . Cancer Maternal Grandmother 37    COLON  . Depression Maternal Grandmother   . Alcohol abuse Maternal Grandmother   . Drug abuse Maternal Grandmother   . Alcohol abuse Maternal Grandfather   . Drug abuse Maternal Grandfather   . Stroke Maternal Grandfather   . Alcohol abuse Paternal Grandfather   . Drug abuse Paternal Grandfather   . Asthma Sister      History  Substance Use Topics  . Smoking status: Never Smoker   . Smokeless tobacco: Never Used  . Alcohol Use: No    Allergies: No Known Allergies  Prescriptions prior to admission  Medication Sig Dispense Refill Last Dose  . albuterol (PROVENTIL HFA;VENTOLIN HFA) 108 (90 BASE) MCG/ACT inhaler Inhale 2 puffs into the lungs every 6 (six) hours as needed for wheezing or shortness of breath. 1 Inhaler 2 08/17/2014 at Unknown time  . Doxylamine-Pyridoxine (DICLEGIS) 10-10 MG TBEC Take 3 tablets by mouth at bedtime.   08/17/2014 at Unknown time  . ferrous sulfate 325 (65 FE) MG tablet Take 1 tablet (325 mg total) by mouth daily with breakfast. 30 tablet 5 08/17/2014 at Unknown time  . folic acid (FOLVITE) 1 MG tablet Take 1 tablet (1 mg total) by mouth daily. 1 tablet 11 08/17/2014 at Unknown time  . ondansetron (ZOFRAN ODT) 4 MG disintegrating tablet Take 1 tablet (4 mg total) by mouth every 6 (six) hours as needed for nausea. 20 tablet 0 08/18/2014 at Unknown time  . Oxycodone HCl  10 MG TABS Take 1 tablet (10 mg total) by mouth every 6 (six) hours as needed. 40 tablet 0 08/17/2014 at Unknown time  . Prenatal Vit-Fe Fumarate-FA (PRENATAL MULTIVITAMIN) TABS tablet Take 1 tablet by mouth daily.    08/18/2014 at Unknown time  . sertraline (ZOLOFT) 50 MG tablet Take 1 tablet (50 mg total) by mouth daily. 30 tablet 6 08/17/2014 at Unknown time  . traZODone (DESYREL) 50 MG tablet Take 1 tablet (50 mg total) by mouth at bedtime. 30 tablet 3 08/17/2014 at Unknown time  . cyclobenzaprine (FLEXERIL) 5 MG tablet Take 1 tablet (5 mg total) by mouth 2 (two) times daily as needed for muscle spasms. (Patient not taking: Reported on 08/18/2014) 20 tablet 0 Not Taking at Unknown time  . traMADol (ULTRAM) 50 MG tablet Take 1 tablet (50 mg total) by mouth every 6 (six) hours as needed. (Patient not taking: Reported on 08/18/2014) 90 tablet 0 Not Taking at Unknown time    Review of Systems  Constitutional:  Negative.   HENT: Negative.   Eyes: Negative.   Respiratory: Negative.   Cardiovascular: Negative.   Gastrointestinal: Positive for abdominal pain.  Genitourinary: Negative.   Musculoskeletal: Negative.   Skin: Negative.   Neurological: Negative.   Psychiatric/Behavioral: Negative.    Physical Exam   Blood pressure 111/58, pulse 77, temperature 98.2 F (36.8 C), resp. rate 18, not currently breastfeeding.  Physical Exam  Constitutional: She is oriented to person, place, and time. She appears well-developed and well-nourished. No distress.  HENT:  Head: Normocephalic and atraumatic.  Cardiovascular: Normal rate, regular rhythm and normal heart sounds.   Respiratory: Effort normal and breath sounds normal.  GI: Soft. Bowel sounds are normal. She exhibits no distension. There is no tenderness.  Musculoskeletal: Normal range of motion.  Neurological: She is alert and oriented to person, place, and time.  Skin: Skin is warm and dry.  Psychiatric: She has a normal mood and affect.  Seems annoyed     MAU Course  Procedures  MDM  Reviewed BBP results with patient  Called Dr Jodi Mourning who advised giving her Ultram for pain and to see Dr Delsa Sale on Monday Pt pulled off her BP cuff and said she was tired and going home  Assessment and Plan   A: Abdominal pain in twin pregnancy  P: Pt left AMA/ will advise Dr Massie Bougie, Milas Kocher 08/18/2014, 3:27 PM

## 2014-08-18 NOTE — Progress Notes (Signed)
Subjective:    Lindsay Ramirez is a 28 y.o. female being seen today for her obstetrical visit. She is at [redacted]w[redacted]d gestation. Patient reports contractions. Fetal movement: normal.  Problem List Items Addressed This Visit    None    Visit Diagnoses    Encounter for supervision of other normal pregnancy in third trimester    -  Primary    Relevant Orders       US Fetal BPP W/O Non Stress (Completed)       POCT urinalysis dipstick (Completed)      Patient Active Problem List   Diagnosis Date Noted  . GBS (group B streptococcus) UTI complicating pregnancy 36/62/9476    Priority: High  . Twin gestation, dichorionic diamniotic 05/04/2014    Priority: High  . Unspecified high-risk pregnancy 04/27/2014    Priority: High  . Airway hyperreactivity 06/15/2013    Priority: High  . Bipolar disorder, unspecified/depression 11/11/2012    Priority: High  . 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 [redacted] weeks gestation of pregnancy   . Decreased fetal movement   . Dichorionic diamniotic twin pregnancy in third trimester   . [redacted] weeks gestation of pregnancy   . Pain aggravated by activities of daily living 07/22/2014  . 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 Nausea/vomiting in pregnancy 04/30/2014  . Migraine without aura 03/25/2014  . Gout 06/15/2013  . Acid reflux 06/21/2012   Objective:    BP 117/72 mmHg  Pulse 78  Temp(Src) 98.3 F (36.8 C)  Wt 90.266 kg (199 lb)  PF   LMP  FHT:  140 x 2 BPM  Uterine Size: size greater than dates  Presentation: unsure       Assessment:    Pregnancy @ [redacted]w[redacted]d weeks       Plan:     labs reviewed, problem list updated  Orders Placed This Encounter  Procedures  . US Fetal BPP W/O Non Stress    Standing Status: Future     Number of Occurrences: 1     Standing Expiration Date: 10/19/2015    Scheduling Instructions:     Schedule for today    Order Specific Question:  Reason for Exam (SYMPTOM  OR DIAGNOSIS REQUIRED)     Answer:  decrease fetal movement    Order Specific Question:  Preferred imaging location?    Answer:  [facility]  . POCT urinalysis dipstick    Follow up in 1 Week.

## 2014-08-20 ENCOUNTER — Inpatient Hospital Stay (HOSPITAL_COMMUNITY)
Admission: AD | Admit: 2014-08-20 | Discharge: 2014-08-20 | Disposition: A | Discharge status: Home / Self Care | Payer: Medicaid Other | Source: Ambulatory Visit | Attending: Obstetrics | Admitting: Obstetrics

## 2014-08-20 ENCOUNTER — Encounter (HOSPITAL_COMMUNITY): Payer: Self-pay

## 2014-08-20 DIAGNOSIS — Z3A31 31 weeks gestation of pregnancy: Secondary | ICD-10-CM | POA: Insufficient documentation

## 2014-08-20 DIAGNOSIS — J45909 Unspecified asthma, uncomplicated: Secondary | ICD-10-CM | POA: Insufficient documentation

## 2014-08-20 DIAGNOSIS — O30003 Twin pregnancy, unspecified number of placenta and unspecified number of amniotic sacs, third trimester: Secondary | ICD-10-CM | POA: Diagnosis not present

## 2014-08-20 DIAGNOSIS — J069 Acute upper respiratory infection, unspecified: Secondary | ICD-10-CM | POA: Insufficient documentation

## 2014-08-20 DIAGNOSIS — O99513 Diseases of the respiratory system complicating pregnancy, third trimester: Secondary | ICD-10-CM | POA: Diagnosis not present

## 2014-08-20 DIAGNOSIS — R109 Unspecified abdominal pain: Secondary | ICD-10-CM | POA: Diagnosis present

## 2014-08-20 DIAGNOSIS — R0602 Shortness of breath: Secondary | ICD-10-CM

## 2014-08-20 DIAGNOSIS — O9989 Other specified diseases and conditions complicating pregnancy, childbirth and the puerperium: Secondary | ICD-10-CM

## 2014-08-20 DIAGNOSIS — B9789 Other viral agents as the cause of diseases classified elsewhere: Secondary | ICD-10-CM

## 2014-08-20 LAB — URINALYSIS, ROUTINE W REFLEX MICROSCOPIC
BILIRUBIN URINE: NEGATIVE
Glucose, UA: NEGATIVE mg/dL
Hgb urine dipstick: NEGATIVE
KETONES UR: NEGATIVE mg/dL
Nitrite: NEGATIVE
PH: 6 (ref 5.0–8.0)
Protein, ur: NEGATIVE mg/dL
Specific Gravity, Urine: 1.01 (ref 1.005–1.030)
Urobilinogen, UA: 0.2 mg/dL (ref 0.0–1.0)

## 2014-08-20 LAB — URINE MICROSCOPIC-ADD ON

## 2014-08-20 MED ORDER — ALBUTEROL SULFATE HFA 108 (90 BASE) MCG/ACT IN AERS: 2.0000 | INHALATION_SPRAY | Freq: Four times a day (QID) | RESPIRATORY_TRACT | Status: DC | PRN

## 2014-08-20 MED ORDER — NIFEDIPINE 10 MG PO CAPS
10.0000 mg | ORAL_CAPSULE | Freq: Once | ORAL | Status: AC
  Administered 2014-08-20: 10 mg via ORAL
  Filled 2014-08-20: qty 1

## 2014-08-20 MED ORDER — GUAIFENESIN-CODEINE 100-10 MG/5ML PO SOLN: 5.0000 mL | Freq: Three times a day (TID) | ORAL | Status: DC | PRN

## 2014-08-20 MED ORDER — GUAIFENESIN-CODEINE 100-10 MG/5ML PO SOLN
5.0000 mL | Freq: Once | ORAL | Status: AC
  Administered 2014-08-20: 5 mL via ORAL
  Filled 2014-08-20: qty 5

## 2014-08-20 NOTE — Discharge Instructions (Signed)
Cough, Adult  A cough is a reflex that helps clear your throat and airways. It can help heal the body or may be a reaction to an irritated airway. A cough may only last 2 or 3 weeks (acute) or may last more than 8 weeks (chronic).  CAUSES Acute cough:  Viral or bacterial infections. Chronic cough:  Infections.  Allergies.  Asthma.  Post-nasal drip.  Smoking.  Heartburn or acid reflux.  Some medicines.  Chronic lung problems (COPD).  Cancer. SYMPTOMS   Cough.  Fever.  Chest pain.  Increased breathing rate.  High-pitched whistling sound when breathing (wheezing).  Colored mucus that you cough up (sputum). TREATMENT   A bacterial cough may be treated with antibiotic medicine.  A viral cough must run its course and will not respond to antibiotics.  Your caregiver may recommend other treatments if you have a chronic cough. HOME CARE INSTRUCTIONS   Only take over-the-counter or prescription medicines for pain, discomfort, or fever as directed by your caregiver. Use cough suppressants only as directed by your caregiver.  Use a cold steam vaporizer or humidifier in your bedroom or home to help loosen secretions.  Sleep in a semi-upright position if your cough is worse at night.  Rest as needed.  Stop smoking if you smoke. SEEK IMMEDIATE MEDICAL CARE IF:   You have pus in your sputum.  Your cough starts to worsen.  You cannot control your cough with suppressants and are losing sleep.  You begin coughing up blood.  You have difficulty breathing.  You develop pain which is getting worse or is uncontrolled with medicine.  You have a fever. MAKE SURE YOU:   Understand these instructions.  Will watch your condition.  Will get help right away if you are not doing well or get worse. Document Released: 02/21/2011 Document Revised: 11/17/2011 Document Reviewed: 02/21/2011 [employer] Patient Information 2015 [employer]. This information is not intended  to replace advice given to you by your health care provider. Make sure you discuss any questions you have with your health care provider.  

## 2014-08-20 NOTE — MAU Provider Note (Signed)
History     CSN: [id number]  Arrival date and time: 08/20/14 1810   First Provider Initiated Contact with Patient 08/20/14 1913      Chief Complaint  Patient presents with  . Abdominal Pain   HPI   [name] is a 28 y.o. female [phone] at [redacted]w[redacted]d twin gestation who presents with RUQ pain that started yesterday. The pain is worse when the patient coughs. She has had a cough/ cold for 2 weeks. She has been taking over the counter cough medication and it has not been helping. The pain does not worsen with eating, the pain is just there all the time. The pain does not feel like contraction pain.   +fetal movement, denies leaking of fluid or vaginal bleeding.    OB History    Gravida Para Term Preterm AB TAB SAB Ectopic Multiple Living   6 4 3 1 1  0 1 0 0 4      Past Medical History  Diagnosis Date  . Sickle cell trait   . Mental disorder   . Bipolar affective disorder   . Bipolar affective disorder 2006    TOOK MEDS X 3 YR  . Depression     PP 2013  . Abnormal Pap smear     LAST PAP 06/2011  . Asthma     [facility]; [facility]  . Infection     UTI  . Anemia     CHRONIC  . Headache(784.0)     SEVERAL TIMES/DAY  . Vaginal Pap smear, abnormal     Past Surgical History  Procedure Laterality Date  . Mouth surgery    . Wisdom tooth extraction  2012  . Vaginal delivery      X 3  . Dilation and evacuation N/A 11/04/2013    Procedure: DILATATION AND EVACUATION;  Surgeon: Alwyn Pea, MD;  Location: [facility];  Service: Gynecology;  Laterality: N/A;    Family History  Problem Relation Age of Onset  . Hypertension Mother   . Asthma Mother   . Heart disease Mother   . Hyperlipidemia Mother   . Thyroid disease Mother   . Alcohol abuse Father   . Asthma Sister   . Depression Sister   . Diabetes Maternal Aunt   . HIV Maternal Aunt   . Cancer Maternal Grandmother 37    COLON  . Depression Maternal Grandmother   . Alcohol abuse Maternal Grandmother   .  Drug abuse Maternal Grandmother   . Alcohol abuse Maternal Grandfather   . Drug abuse Maternal Grandfather   . Stroke Maternal Grandfather   . Alcohol abuse Paternal Grandfather   . Drug abuse Paternal Grandfather   . Asthma Sister     History  Substance Use Topics  . Smoking status: Never Smoker   . Smokeless tobacco: Never Used  . Alcohol Use: No    Allergies: No Known Allergies  Prescriptions prior to admission  Medication Sig Dispense Refill Last Dose  . albuterol (PROVENTIL HFA;VENTOLIN HFA) 108 (90 BASE) MCG/ACT inhaler Inhale 2 puffs into the lungs every 6 (six) hours as needed for wheezing or shortness of breath. 1 Inhaler 2 rescue  . ferrous sulfate 325 (65 FE) MG tablet Take 1 tablet (325 mg total) by mouth daily with breakfast. 30 tablet 5 Past Week at Unknown time  . folic acid (FOLVITE) 1 MG tablet Take 1 tablet (1 mg total) by mouth daily. 1 tablet 11 Past Week at Unknown time  .  ondansetron (ZOFRAN ODT) 4 MG disintegrating tablet Take 1 tablet (4 mg total) by mouth every 6 (six) hours as needed for nausea. 20 tablet 0 Past Week at Unknown time  . Oxycodone HCl 10 MG TABS Take 1 tablet (10 mg total) by mouth every 6 (six) hours as needed. 40 tablet 0 Past Week at Unknown time  . Prenatal Vit-Fe Fumarate-FA (PRENATAL VITAMINS PLUS) 27-1 MG TABS Take 1 tablet by mouth.   08/19/2014 at Unknown time  . sertraline (ZOLOFT) 50 MG tablet Take 1 tablet (50 mg total) by mouth daily. 30 tablet 6 08/19/2014 at Unknown time  . traZODone (DESYREL) 50 MG tablet Take 1 tablet (50 mg total) by mouth at bedtime. 30 tablet 3 Past Week at Unknown time  . cyclobenzaprine (FLEXERIL) 5 MG tablet Take 1 tablet (5 mg total) by mouth 2 (two) times daily as needed for muscle spasms. (Patient not taking: Reported on 08/18/2014) 20 tablet 0 Not Taking at Unknown time  . traMADol (ULTRAM) 50 MG tablet Take 1 tablet (50 mg total) by mouth every 6 (six) hours as needed. (Patient not taking: Reported on  08/20/2014) 90 tablet 0 Not Taking at Unknown time   Results for orders placed or performed during the hospital encounter of 08/20/14 (from the past 48 hour(s))  Urinalysis, Routine w reflex microscopic     Status: Abnormal   Collection Time: 08/20/14  6:22 PM  Result Value Ref Range   Color, Urine YELLOW YELLOW   APPearance CLEAR CLEAR   Specific Gravity, Urine 1.010 1.005 - 1.030   pH 6.0 5.0 - 8.0   Glucose, UA NEGATIVE NEGATIVE mg/dL   Hgb urine dipstick NEGATIVE NEGATIVE   Bilirubin Urine NEGATIVE NEGATIVE   Ketones, ur NEGATIVE NEGATIVE mg/dL   Protein, ur NEGATIVE NEGATIVE mg/dL   Urobilinogen, UA 0.2 0.0 - 1.0 mg/dL   Nitrite NEGATIVE NEGATIVE   Leukocytes, UA TRACE (A) NEGATIVE  Urine microscopic-add on     Status: Abnormal   Collection Time: 08/20/14  6:22 PM  Result Value Ref Range   Squamous Epithelial / LPF FEW (A) RARE   WBC, UA 0-2 <3 WBC/hpf   RBC / HPF 0-2 <3 RBC/hpf   Bacteria, UA FEW (A) RARE    Review of Systems  Constitutional: Negative for fever.  HENT: Negative for sore throat.   Respiratory: Positive for cough and sputum production. Negative for wheezing.   Gastrointestinal: Positive for abdominal pain (RUQ ) and constipation. Negative for nausea and vomiting.  Genitourinary: Negative for dysuria, urgency and frequency.       Denies vaginal bleeding or discharge   Physical Exam   Blood pressure 108/60, pulse 73, temperature 98.6 F (37 C), temperature source Oral, resp. rate 16, not currently breastfeeding.  Physical Exam  Constitutional: Vital signs are normal. She appears well-developed and well-nourished.  Non-toxic appearance. She does not have a sickly appearance. She does not appear ill. No distress.  HENT:  Head: Normocephalic.  Eyes: Pupils are equal, round, and reactive to light.  Neck: Neck supple.  Cardiovascular: Normal rate and normal heart sounds.   Respiratory: Effort normal and breath sounds normal. No respiratory distress.  GI:  Soft. Normal appearance. There is generalized tenderness. There is no rigidity, no rebound and no guarding.  Genitourinary:   Bimanual exam; Cervix ; closed, thick, middle   Skin: She is not diaphoretic.    Fetal Tracing: A Baseline: 130 bpm  Variability: Moderate  Accelerations: 15x15 Decelerations: None Toco: occasional UI  Fetal Tracing: B Baseline: 140 bpm  Variability: Moderate  Accelerations: 15x15 Decelerations: None   MAU Course  Procedures  None  MDM Procardia Robitussin  UA  Report given to [place] who resumes care of the patient.  2102: Patient reports that her pain is gone (she has had procardia and robitussin)  Assessment and Plan   1. SOB (shortness of breath)   2. Viral URI with cough    Comfort measures PTL precautions Fetal kick counts Return to MAU as needed  Follow-up Information    Follow up with HARPER,CHARLES A, MD.   Specialty:  Obstetrics and Gynecology   Why:  As scheduled   Contact information:   [address] [place] [address] [phone]        Darrelyn Hillock Rasch, NP 08/20/2014 8:03 PM

## 2014-08-20 NOTE — MAU Note (Signed)
Pt states here for ruq pain. Has had coughing x2 weeks, and ruq pain began yesterday. Has taken multiple meds for coughing with no relief.

## 2014-08-22 ENCOUNTER — Other Ambulatory Visit: Payer: Self-pay | Admitting: *Deleted

## 2014-08-22 ENCOUNTER — Telehealth: Payer: Self-pay

## 2014-08-22 DIAGNOSIS — O30003 Twin pregnancy, unspecified number of placenta and unspecified number of amniotic sacs, third trimester: Secondary | ICD-10-CM

## 2014-08-22 NOTE — Telephone Encounter (Signed)
DR J-M WANTED PATIENT TO HAVE ANOTHER BPP - [facility] DID NOT HAVE THURSDAY APPTS - SO CALLED [facility] - APPT IS 11:15, 12/17 - CALLED PATIENT AND LEFT VM FOR HER.

## 2014-08-24 ENCOUNTER — Other Ambulatory Visit: Payer: Self-pay | Admitting: Obstetrics & Gynecology

## 2014-08-24 ENCOUNTER — Ambulatory Visit (HOSPITAL_COMMUNITY)
Admission: RE | Admit: 2014-08-24 | Discharge: 2014-08-24 | Disposition: A | Discharge status: Home / Self Care | Payer: Medicaid Other | Source: Ambulatory Visit | Attending: Obstetrics & Gynecology | Admitting: Obstetrics & Gynecology

## 2014-08-24 ENCOUNTER — Encounter: Payer: Self-pay | Admitting: Obstetrics & Gynecology

## 2014-08-24 ENCOUNTER — Ambulatory Visit (INDEPENDENT_AMBULATORY_CARE_PROVIDER_SITE_OTHER): Payer: Medicaid Other | Admitting: Obstetrics & Gynecology

## 2014-08-24 VITALS — BP 99/66 | HR 69 | Temp 98.8°F | Wt 204.0 lb

## 2014-08-24 DIAGNOSIS — O30003 Twin pregnancy, unspecified number of placenta and unspecified number of amniotic sacs, third trimester: Secondary | ICD-10-CM | POA: Diagnosis present

## 2014-08-24 DIAGNOSIS — Z3483 Encounter for supervision of other normal pregnancy, third trimester: Secondary | ICD-10-CM

## 2014-08-24 DIAGNOSIS — O30002 Twin pregnancy, unspecified number of placenta and unspecified number of amniotic sacs, second trimester: Secondary | ICD-10-CM

## 2014-08-24 LAB — POCT URINALYSIS DIPSTICK
Bilirubin, UA: NEGATIVE
Blood, UA: NEGATIVE
Glucose, UA: NEGATIVE
Ketones, UA: NEGATIVE
NITRITE UA: NEGATIVE
Spec Grav, UA: <=1.005
UROBILINOGEN UA: NEGATIVE
pH, UA: 7

## 2014-08-25 ENCOUNTER — Ambulatory Visit (HOSPITAL_COMMUNITY)
Admission: RE | Admit: 2014-08-25 | Discharge: 2014-08-25 | Disposition: A | Discharge status: Home / Self Care | Payer: Medicaid Other | Source: Ambulatory Visit | Attending: Obstetrics & Gynecology | Admitting: Obstetrics & Gynecology

## 2014-08-25 ENCOUNTER — Other Ambulatory Visit: Payer: Self-pay | Admitting: Obstetrics

## 2014-08-25 DIAGNOSIS — Z3A32 32 weeks gestation of pregnancy: Secondary | ICD-10-CM | POA: Insufficient documentation

## 2014-08-25 DIAGNOSIS — O30002 Twin pregnancy, unspecified number of placenta and unspecified number of amniotic sacs, second trimester: Secondary | ICD-10-CM

## 2014-08-25 DIAGNOSIS — O30043 Twin pregnancy, dichorionic/diamniotic, third trimester: Secondary | ICD-10-CM | POA: Diagnosis not present

## 2014-08-26 DIAGNOSIS — O365930 Maternal care for other known or suspected poor fetal growth, third trimester, not applicable or unspecified: Secondary | ICD-10-CM | POA: Insufficient documentation

## 2014-08-27 NOTE — Progress Notes (Signed)
Subjective:    Lindsay Ramirez is a 28 y.o. female being seen today for her obstetrical visit. She is at [redacted]w[redacted]d gestation. Patient reports contractions. Fetal movement: normal.  Problem List Items Addressed This Visit    None    Visit Diagnoses    Encounter for supervision of other normal pregnancy in third trimester    -  Primary    Relevant Orders       POCT urinalysis dipstick (Completed)      Patient Active Problem List   Diagnosis Date Noted  . GBS (group B streptococcus) UTI complicating pregnancy 27/11/5007    Priority: High  . Twin gestation, dichorionic diamniotic 05/04/2014    Priority: High  . Unspecified high-risk pregnancy 04/27/2014    Priority: High  . Airway hyperreactivity 06/15/2013    Priority: High  . Bipolar disorder, unspecified/depression 11/11/2012    Priority: High  . 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 Poor fetal growth affecting management of mother in third trimester, antepartum   . [redacted] weeks gestation of pregnancy   . [redacted] weeks gestation of pregnancy   . Decreased fetal movement   . Dichorionic diamniotic twin pregnancy in third trimester   . [redacted] weeks gestation of pregnancy   . Pain aggravated by activities of daily living 07/22/2014  . 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 Nausea/vomiting in pregnancy 04/30/2014  . Migraine without aura 03/25/2014  . Gout 06/15/2013  . Acid reflux 06/21/2012   Objective:    BP 99/66 mmHg  Pulse 69  Temp(Src) 98.8 F (37.1 C)  Wt 92.534 kg (204 lb)  LMP  FHT:  140 x 2 BPM  Uterine Size: size greater than dates  Presentation: unsure       Assessment:    Pregnancy @ [redacted]w[redacted]d weeks       Plan:     labs reviewed, problem list updated  Orders Placed This Encounter  Procedures  . POCT urinalysis dipstick    Follow up in 1 Week.

## 2014-08-29 ENCOUNTER — Inpatient Hospital Stay (HOSPITAL_COMMUNITY): Payer: Medicaid Other

## 2014-08-29 ENCOUNTER — Encounter (HOSPITAL_COMMUNITY): Payer: Self-pay | Admitting: *Deleted

## 2014-08-29 ENCOUNTER — Inpatient Hospital Stay (HOSPITAL_COMMUNITY)
Admission: AD | Admit: 2014-08-29 | Discharge: 2014-08-29 | Disposition: A | Discharge status: Home / Self Care | Payer: Medicaid Other | Source: Ambulatory Visit | Attending: Obstetrics | Admitting: Obstetrics

## 2014-08-29 ENCOUNTER — Telehealth: Payer: Self-pay | Admitting: *Deleted

## 2014-08-29 DIAGNOSIS — IMO0002 Reserved for concepts with insufficient information to code with codable children: Secondary | ICD-10-CM

## 2014-08-29 DIAGNOSIS — D573 Sickle-cell trait: Secondary | ICD-10-CM | POA: Insufficient documentation

## 2014-08-29 DIAGNOSIS — O99013 Anemia complicating pregnancy, third trimester: Secondary | ICD-10-CM | POA: Insufficient documentation

## 2014-08-29 DIAGNOSIS — Z3A33 33 weeks gestation of pregnancy: Secondary | ICD-10-CM | POA: Diagnosis not present

## 2014-08-29 DIAGNOSIS — R109 Unspecified abdominal pain: Secondary | ICD-10-CM | POA: Diagnosis present

## 2014-08-29 DIAGNOSIS — O368130 Decreased fetal movements, third trimester, not applicable or unspecified: Secondary | ICD-10-CM

## 2014-08-29 DIAGNOSIS — B951 Streptococcus, group B, as the cause of diseases classified elsewhere: Secondary | ICD-10-CM

## 2014-08-29 DIAGNOSIS — O368132 Decreased fetal movements, third trimester, fetus 2: Secondary | ICD-10-CM

## 2014-08-29 DIAGNOSIS — O368190 Decreased fetal movements, unspecified trimester, not applicable or unspecified: Secondary | ICD-10-CM

## 2014-08-29 DIAGNOSIS — O2343 Unspecified infection of urinary tract in pregnancy, third trimester: Secondary | ICD-10-CM

## 2014-08-29 DIAGNOSIS — O219 Vomiting of pregnancy, unspecified: Secondary | ICD-10-CM

## 2014-08-29 DIAGNOSIS — O30003 Twin pregnancy, unspecified number of placenta and unspecified number of amniotic sacs, third trimester: Secondary | ICD-10-CM | POA: Diagnosis not present

## 2014-08-29 LAB — URINALYSIS, ROUTINE W REFLEX MICROSCOPIC
Bilirubin Urine: NEGATIVE
Glucose, UA: NEGATIVE mg/dL
Hgb urine dipstick: NEGATIVE
Ketones, ur: NEGATIVE mg/dL
Nitrite: NEGATIVE
Protein, ur: NEGATIVE mg/dL
Specific Gravity, Urine: 1.01 (ref 1.005–1.030)
Urobilinogen, UA: 0.2 mg/dL (ref 0.0–1.0)
pH: 7 (ref 5.0–8.0)

## 2014-08-29 LAB — URINE MICROSCOPIC-ADD ON

## 2014-08-29 NOTE — MAU Note (Signed)
Patient states she is having twins. Has felt less fetal movement with twin B since yesterday. Has been having abdominal and back pain. Denies bleeding or leaking fluid.

## 2014-08-29 NOTE — MAU Note (Signed)
Baby A transducer adjusted

## 2014-08-29 NOTE — Progress Notes (Signed)
Raynelle Jan NP in to see pt and discuss d/c plan. Written and verbal d/c instructions given and understanding voiced.

## 2014-08-29 NOTE — MAU Note (Signed)
U/s called and spoke to Sherolyn Buba NP. Pt had 8/8 BPP so will go home from U/S and keep scheduled appt with [facility] for tomorrow. Pt agrees

## 2014-08-29 NOTE — MAU Provider Note (Signed)
History     CSN: [id number]  Arrival date and time: 08/29/14 1229   None     Chief Complaint  Patient presents with  . Abdominal Pain  . Back Pain  . Decreased Fetal Movement   HPIpt is [redacted]w[redacted]d pregnant with twins.  Pt states she has had some pelvic pain yesterday and today, which she has not had.  Pt states she did not feel Baby B yesterday at all and today only 2 times.  Pt had ultrasound done last week which showed Baby B not growing.  Pt denies spotting, bleeding or leakage of fluid- having some ctx.  RN note: Patient states she is having twins. Has felt less fetal movement with twin B since yesterday. Has been having abdominal and back pain. Denies bleeding or leaking fluid Past Medical History  Diagnosis Date  . Sickle cell trait   . Mental disorder   . Bipolar affective disorder   . Bipolar affective disorder 2006    TOOK MEDS X 3 YR  . Depression     PP 2013  . Abnormal Pap smear     LAST PAP 06/2011  . Asthma     [facility]; [facility]  . Infection     UTI  . Anemia     CHRONIC  . Headache(784.0)     SEVERAL TIMES/DAY  . Vaginal Pap smear, abnormal     Past Surgical History  Procedure Laterality Date  . Mouth surgery    . Wisdom tooth extraction  2012  . Vaginal delivery      X 3  . Dilation and evacuation N/A 11/04/2013    Procedure: DILATATION AND EVACUATION;  Surgeon: Alwyn Pea, MD;  Location: [facility];  Service: Gynecology;  Laterality: N/A;    Family History  Problem Relation Age of Onset  . Hypertension Mother   . Asthma Mother   . Heart disease Mother   . Hyperlipidemia Mother   . Thyroid disease Mother   . Alcohol abuse Father   . Asthma Sister   . Depression Sister   . Diabetes Maternal Aunt   . HIV Maternal Aunt   . Cancer Maternal Grandmother 37    COLON  . Depression Maternal Grandmother   . Alcohol abuse Maternal Grandmother   . Drug abuse Maternal Grandmother   . Alcohol abuse Maternal Grandfather   . Drug abuse Maternal  Grandfather   . Stroke Maternal Grandfather   . Alcohol abuse Paternal Grandfather   . Drug abuse Paternal Grandfather   . Asthma Sister     History  Substance Use Topics  . Smoking status: Never Smoker   . Smokeless tobacco: Never Used  . Alcohol Use: No    Allergies: No Known Allergies  Prescriptions prior to admission  Medication Sig Dispense Refill Last Dose  . albuterol (PROVENTIL HFA;VENTOLIN HFA) 108 (90 BASE) MCG/ACT inhaler Inhale 2 puffs into the lungs every 6 (six) hours as needed for wheezing or shortness of breath. 1 Inhaler 2 Taking  . cyclobenzaprine (FLEXERIL) 5 MG tablet Take 1 tablet (5 mg total) by mouth 2 (two) times daily as needed for muscle spasms. 20 tablet 0 Taking  . ferrous sulfate 325 (65 FE) MG tablet Take 1 tablet (325 mg total) by mouth daily with breakfast. 30 tablet 5 Taking  . folic acid (FOLVITE) 1 MG tablet Take 1 tablet (1 mg total) by mouth daily. 1 tablet 11 Taking  . guaiFENesin-codeine 100-10 MG/5ML syrup Take 5 mLs by mouth  3 (three) times daily as needed for cough. (Patient not taking: Reported on 08/24/2014) 120 mL 0 Not Taking  . ondansetron (ZOFRAN ODT) 4 MG disintegrating tablet Take 1 tablet (4 mg total) by mouth every 6 (six) hours as needed for nausea. 20 tablet 0 Taking  . Oxycodone HCl 10 MG TABS Take 1 tablet (10 mg total) by mouth every 6 (six) hours as needed. (Patient not taking: Reported on 08/24/2014) 40 tablet 0 Not Taking  . Prenatal Vit-Fe Fumarate-FA (PRENATAL VITAMINS PLUS) 27-1 MG TABS Take 1 tablet by mouth.   Taking  . sertraline (ZOLOFT) 50 MG tablet Take 1 tablet (50 mg total) by mouth daily. 30 tablet 6 Taking  . traZODone (DESYREL) 50 MG tablet Take 1 tablet (50 mg total) by mouth at bedtime. 30 tablet 3 Taking    Review of Systems  Constitutional: Negative for fever and chills.  Gastrointestinal: Negative for nausea and vomiting.       Occ ctx  Genitourinary: Negative for dysuria and urgency.   Physical Exam    Blood pressure 107/60, pulse 79, temperature 99.1 F (37.3 C), temperature source Oral, resp. rate 18, SpO2 100 %, not currently breastfeeding.  Physical Exam  Nursing note and vitals reviewed. Constitutional: She is oriented to person, place, and time. She appears well-developed and well-nourished. No distress.  HENT:  Head: Normocephalic.  Eyes: Pupils are equal, round, and reactive to light.  Neck: Normal range of motion. Neck supple.  Cardiovascular: Normal rate.   Respiratory: Effort normal.  GI: Soft.  occ ctx  Musculoskeletal: Normal range of motion.  Neurological: She is alert and oriented to person, place, and time.  Skin: Skin is warm and dry.  Psychiatric: She has a normal mood and affect.    MAU Course  Procedures discussed with Dr. Jodi Mourning- may be discharged home to follow up with scheduled appointments Return if recurrence of decreased fetal movement- kick counts given FHR appeared reactive with both babies, but with FHR being so close, BPP done after  Pt discharge for reassurance Fetus A baseline 135bpm 6-25bpm variability  with 15x15 accelerations none noted- however episodic difficult tracing Fetus B baseline 135 bpm 6-25 bpm  with 10 x 10 accelerations  [place] 8/8 on both babies Assessment and Plan  Decreased fetal movement Questionable NST- U/S BPP ordered- 8/8  Ad Guttman 08/29/2014, 1:38 PM

## 2014-08-29 NOTE — Telephone Encounter (Signed)
Patient reports she is having increased pressure and she has not felt baby B move today. Patient advised she needs to be evaluated at the hospital- if her baby is not moving. Patient wants to come to the office instead. Per Dr Jodi Mourning- patient needs to go to MAU if baby is not moving- patient reports no movement since yesterday. Patient strongly urged to go to MAU.

## 2014-08-29 NOTE — MAU Note (Signed)
Pt will have BPP before going home and agrees with plan

## 2014-08-30 ENCOUNTER — Encounter: Payer: Self-pay | Admitting: Obstetrics & Gynecology

## 2014-08-30 ENCOUNTER — Ambulatory Visit (INDEPENDENT_AMBULATORY_CARE_PROVIDER_SITE_OTHER): Payer: Medicaid Other | Admitting: Obstetrics & Gynecology

## 2014-08-30 VITALS — BP 122/72 | HR 76 | Temp 98.0°F | Wt 201.0 lb

## 2014-08-30 DIAGNOSIS — O30002 Twin pregnancy, unspecified number of placenta and unspecified number of amniotic sacs, second trimester: Secondary | ICD-10-CM

## 2014-08-30 DIAGNOSIS — Z3483 Encounter for supervision of other normal pregnancy, third trimester: Secondary | ICD-10-CM

## 2014-08-30 NOTE — Progress Notes (Signed)
Patient was seen at the hospital yesterday- decreased movement. Patient reports everything is good today

## 2014-08-31 ENCOUNTER — Other Ambulatory Visit: Payer: Self-pay | Admitting: Certified Nurse Midwife

## 2014-08-31 ENCOUNTER — Ambulatory Visit (HOSPITAL_COMMUNITY)
Admission: RE | Admit: 2014-08-31 | Discharge: 2014-08-31 | Disposition: A | Discharge status: Home / Self Care | Payer: Medicaid Other | Source: Ambulatory Visit | Attending: Obstetrics & Gynecology | Admitting: Obstetrics & Gynecology

## 2014-08-31 DIAGNOSIS — O365932 Maternal care for other known or suspected poor fetal growth, third trimester, fetus 2: Secondary | ICD-10-CM | POA: Diagnosis present

## 2014-08-31 DIAGNOSIS — O368932 Maternal care for other specified fetal problems, third trimester, fetus 2: Secondary | ICD-10-CM | POA: Diagnosis not present

## 2014-08-31 DIAGNOSIS — O30002 Twin pregnancy, unspecified number of placenta and unspecified number of amniotic sacs, second trimester: Secondary | ICD-10-CM

## 2014-08-31 DIAGNOSIS — Z3A33 33 weeks gestation of pregnancy: Secondary | ICD-10-CM | POA: Insufficient documentation

## 2014-08-31 DIAGNOSIS — O30043 Twin pregnancy, dichorionic/diamniotic, third trimester: Secondary | ICD-10-CM | POA: Insufficient documentation

## 2014-08-31 DIAGNOSIS — IMO0001 Reserved for inherently not codable concepts without codable children: Secondary | ICD-10-CM

## 2014-09-01 NOTE — Progress Notes (Signed)
Subjective:    Lindsay Ramirez is being seen today for her obstetrical visit.  She is at [redacted]w[redacted]d gestation. Patient reports no complaints.   Fetal Movement: normal.   Problem List Items Addressed This Visit    None    Visit Diagnoses    Encounter for supervision of other normal pregnancy in third trimester    -  Primary    Relevant Orders       POCT urinalysis dipstick      Patient Active Problem List   Diagnosis Date Noted  . GBS (group B streptococcus) UTI complicating pregnancy 56/81/2751    Priority: High  . Twin gestation, dichorionic diamniotic 05/04/2014    Priority: High  . Unspecified high-risk pregnancy 04/27/2014    Priority: High  . Airway hyperreactivity 06/15/2013    Priority: High  . Bipolar disorder, unspecified/depression 11/11/2012    Priority: High  . 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 [redacted] weeks gestation of pregnancy   . Poor fetal growth affecting management of mother in third trimester, antepartum   . [redacted] weeks gestation of pregnancy   . [redacted] weeks gestation of pregnancy   . Decreased fetal movement   . Dichorionic diamniotic twin pregnancy in third trimester   . [redacted] weeks gestation of pregnancy   . Pain aggravated by activities of daily living 07/22/2014  . 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 Nausea/vomiting in pregnancy 04/30/2014  . Migraine without aura 03/25/2014  . Gout 06/15/2013  . Acid reflux 06/21/2012   Objective:    BP 122/72 mmHg  Pulse 76  Temp(Src) 98 F (36.7 C)  Wt 91.173 kg (201 lb)  LMP  FHT:  Baby A: 140 BPM;  Baby B:  140 BPM  Uterine Size: consistent with twins  Presentations: Baby A: unsure; Baby A: unsure     Assessment:    Pregnancy @ [redacted]w[redacted]d. Twins, dichorionic, diamniotic.  Social issues--?homeless Plan:  Consult w/Social Services Fetal surveillance for twins discussed.  Plan to continue twice-weekly fetal testing w/U/A dopplers Fetal positions and related modes of delivery  discussed. Follow-up: twice weekly.

## 2014-09-04 ENCOUNTER — Ambulatory Visit (INDEPENDENT_AMBULATORY_CARE_PROVIDER_SITE_OTHER): Payer: Medicaid Other | Admitting: Obstetrics & Gynecology

## 2014-09-04 ENCOUNTER — Encounter: Payer: Self-pay | Admitting: Obstetrics & Gynecology

## 2014-09-04 ENCOUNTER — Encounter: Payer: Self-pay | Admitting: *Deleted

## 2014-09-04 VITALS — BP 112/73 | HR 77 | Temp 98.2°F | Wt 203.0 lb

## 2014-09-04 DIAGNOSIS — O26893 Other specified pregnancy related conditions, third trimester: Secondary | ICD-10-CM

## 2014-09-04 DIAGNOSIS — O30002 Twin pregnancy, unspecified number of placenta and unspecified number of amniotic sacs, second trimester: Secondary | ICD-10-CM

## 2014-09-04 DIAGNOSIS — N898 Other specified noninflammatory disorders of vagina: Secondary | ICD-10-CM

## 2014-09-04 NOTE — Progress Notes (Signed)
Subjective:    Lindsay Ramirez is being seen today for her obstetrical visit.  She is at [redacted]w[redacted]d gestation. Patient reports "feeling wet for several days; ?"gush" of fluid earlier today.   Fetal Movement: normal.   Problem List Items Addressed This Visit    None    Visit Diagnoses    Vaginal discharge during pregnancy in third trimester    -  Primary    Relevant Orders       SureSwab Bacterial Vaginosis/itis      Patient Active Problem List   Diagnosis Date Noted  . GBS (group B streptococcus) UTI complicating pregnancy 02/63/7858    Priority: High  . Twin gestation, dichorionic diamniotic 05/04/2014    Priority: High  . Unspecified high-risk pregnancy 04/27/2014    Priority: High  . Airway hyperreactivity 06/15/2013    Priority: High  . Bipolar disorder, unspecified/depression 11/11/2012    Priority: High  . 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 [redacted] weeks gestation of pregnancy   . Poor fetal growth affecting management of mother in third trimester, antepartum   . [redacted] weeks gestation of pregnancy   . [redacted] weeks gestation of pregnancy   . Decreased fetal movement   . Dichorionic diamniotic twin pregnancy in third trimester   . [redacted] weeks gestation of pregnancy   . Pain aggravated by activities of daily living 07/22/2014  . 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 Nausea/vomiting in pregnancy 04/30/2014  . Migraine without aura 03/25/2014  . Gout 06/15/2013  . Acid reflux 06/21/2012   Objective:    BP 112/73 mmHg  Pulse 77  Temp(Src) 98.2 F (36.8 C)  Wt 92.08 kg (203 lb)  PF   LMP  FHT:  Baby A: 140 BPM;  Baby B:  140 BPM  Uterine Size: consistent with twins  Presentations: Baby A: unsure; Baby A: unsure     SPEC: no pooling; discharge fern/nitrazine negative; cx appears closed Assessment:    Pregnancy @ [redacted]w[redacted]d. Twins, dichorionic, diamniotic.  Social issues--?homeless Doubt PPROM Plan:  Consult w/Social Services Fetal surveillance for  twins discussed.  Plan to continue twice-weekly fetal testing w/U/A dopplers Return in 1 week w/NST Follow-up: twice weekly.

## 2014-09-05 ENCOUNTER — Encounter: Payer: Self-pay | Admitting: Obstetrics & Gynecology

## 2014-09-06 ENCOUNTER — Other Ambulatory Visit: Payer: Self-pay | Admitting: *Deleted

## 2014-09-06 DIAGNOSIS — O30003 Twin pregnancy, unspecified number of placenta and unspecified number of amniotic sacs, third trimester: Secondary | ICD-10-CM

## 2014-09-07 ENCOUNTER — Ambulatory Visit (HOSPITAL_COMMUNITY)
Admission: RE | Admit: 2014-09-07 | Discharge: 2014-09-07 | Disposition: A | Discharge status: Home / Self Care | Payer: Medicaid Other | Source: Ambulatory Visit | Attending: Obstetrics & Gynecology | Admitting: Obstetrics & Gynecology

## 2014-09-07 ENCOUNTER — Inpatient Hospital Stay (HOSPITAL_COMMUNITY)
Admission: AD | Admit: 2014-09-07 | Discharge: 2014-09-07 | Disposition: A | Discharge status: Home / Self Care | Payer: Medicaid Other | Source: Ambulatory Visit | Attending: Obstetrics & Gynecology | Admitting: Obstetrics & Gynecology

## 2014-09-07 ENCOUNTER — Encounter (HOSPITAL_COMMUNITY): Payer: Self-pay

## 2014-09-07 ENCOUNTER — Other Ambulatory Visit: Payer: Self-pay | Admitting: Certified Nurse Midwife

## 2014-09-07 ENCOUNTER — Ambulatory Visit (HOSPITAL_COMMUNITY): Payer: Medicaid Other

## 2014-09-07 ENCOUNTER — Encounter (HOSPITAL_COMMUNITY): Payer: Self-pay | Admitting: *Deleted

## 2014-09-07 ENCOUNTER — Telehealth: Payer: Self-pay

## 2014-09-07 ENCOUNTER — Other Ambulatory Visit: Payer: Self-pay | Admitting: Obstetrics

## 2014-09-07 DIAGNOSIS — IMO0001 Reserved for inherently not codable concepts without codable children: Secondary | ICD-10-CM

## 2014-09-07 DIAGNOSIS — O30043 Twin pregnancy, dichorionic/diamniotic, third trimester: Secondary | ICD-10-CM | POA: Diagnosis not present

## 2014-09-07 DIAGNOSIS — O9989 Other specified diseases and conditions complicating pregnancy, childbirth and the puerperium: Secondary | ICD-10-CM | POA: Diagnosis not present

## 2014-09-07 DIAGNOSIS — Z3A34 34 weeks gestation of pregnancy: Secondary | ICD-10-CM | POA: Insufficient documentation

## 2014-09-07 DIAGNOSIS — J45909 Unspecified asthma, uncomplicated: Secondary | ICD-10-CM | POA: Insufficient documentation

## 2014-09-07 DIAGNOSIS — K529 Noninfective gastroenteritis and colitis, unspecified: Secondary | ICD-10-CM | POA: Diagnosis not present

## 2014-09-07 DIAGNOSIS — O30003 Twin pregnancy, unspecified number of placenta and unspecified number of amniotic sacs, third trimester: Secondary | ICD-10-CM

## 2014-09-07 DIAGNOSIS — O212 Late vomiting of pregnancy: Secondary | ICD-10-CM | POA: Diagnosis present

## 2014-09-07 DIAGNOSIS — O365932 Maternal care for other known or suspected poor fetal growth, third trimester, fetus 2: Secondary | ICD-10-CM | POA: Diagnosis present

## 2014-09-07 DIAGNOSIS — O4703 False labor before 37 completed weeks of gestation, third trimester: Secondary | ICD-10-CM

## 2014-09-07 DIAGNOSIS — O99513 Diseases of the respiratory system complicating pregnancy, third trimester: Secondary | ICD-10-CM | POA: Diagnosis not present

## 2014-09-07 LAB — URINALYSIS, ROUTINE W REFLEX MICROSCOPIC
Bilirubin Urine: NEGATIVE
Glucose, UA: NEGATIVE mg/dL
Hgb urine dipstick: NEGATIVE
Ketones, ur: NEGATIVE mg/dL
Nitrite: NEGATIVE
Protein, ur: NEGATIVE mg/dL
Specific Gravity, Urine: <1.005 — ABNORMAL LOW (ref 1.005–1.030)
Urobilinogen, UA: 1 mg/dL (ref 0.0–1.0)
pH: 6.5 (ref 5.0–8.0)

## 2014-09-07 LAB — SURESWAB BACTERIAL VAGINOSIS/ITIS
Atopobium vaginae: NOT DETECTED Log (cells/mL)
C. albicans, DNA: DETECTED — AB
C. glabrata, DNA: NOT DETECTED
C. parapsilosis, DNA: NOT DETECTED
C. tropicalis, DNA: NOT DETECTED
Gardnerella vaginalis: <4.7 Log (cells/mL)
LACTOBACILLUS SPECIES: NOT DETECTED Log (cells/mL)
MEGASPHAERA SPECIES: NOT DETECTED Log (cells/mL)
T. vaginalis RNA, QL TMA: NOT DETECTED

## 2014-09-07 LAB — URINE MICROSCOPIC-ADD ON

## 2014-09-07 MED ORDER — TERBUTALINE SULFATE 1 MG/ML IJ SOLN
0.2500 mg | Freq: Once | INTRAMUSCULAR | Status: AC
  Administered 2014-09-07: 0.25 mg via SUBCUTANEOUS
  Filled 2014-09-07: qty 1

## 2014-09-07 MED ORDER — TERBUTALINE SULFATE 1 MG/ML IJ SOLN: 0.2500 mg | Freq: Once | INTRAMUSCULAR | Status: DC

## 2014-09-07 NOTE — Progress Notes (Signed)
Pt states her pain is  A 9 even with out contractions

## 2014-09-07 NOTE — MAU Note (Signed)
Pt states she has been vomiting and having diarrhea about 1 hour ago. Pt states she doesn't if it is contractions or stomach flu

## 2014-09-07 NOTE — MAU Provider Note (Signed)
History     CSN: [id number]  Arrival date and time: 09/07/14 4801   First Provider Initiated Contact with Patient 09/07/14 1858      Chief Complaint  Patient presents with  . Emesis  . Diarrhea   HPI This is a 28 y.o. female at [redacted]w[redacted]d who presents with c/o one hour of vomiting and diarrhea. Also having some contractions. Twin gestation.   RN note:  Expand All Collapse All   Pt states she has been vomiting and having diarrhea about 1 hour ago. Pt states she doesn't if it is contractions or stomach flu          OB History    Gravida Para Term Preterm AB TAB SAB Ectopic Multiple Living   6 4 3 1 1  0 1 0 0 4      Past Medical History  Diagnosis Date  . Sickle cell trait   . Mental disorder   . Bipolar affective disorder   . Bipolar affective disorder 2006    TOOK MEDS X 3 YR  . Depression     PP 2013  . Abnormal Pap smear     LAST PAP 06/2011  . Asthma     [facility]; [facility]  . Infection     UTI  . Anemia     CHRONIC  . Headache(784.0)     SEVERAL TIMES/DAY  . Vaginal Pap smear, abnormal     Past Surgical History  Procedure Laterality Date  . Mouth surgery    . Wisdom tooth extraction  2012  . Vaginal delivery      X 3  . Dilation and evacuation N/A 11/04/2013    Procedure: DILATATION AND EVACUATION;  Surgeon: Alwyn Pea, MD;  Location: [facility];  Service: Gynecology;  Laterality: N/A;    Family History  Problem Relation Age of Onset  . Hypertension Mother   . Asthma Mother   . Heart disease Mother   . Hyperlipidemia Mother   . Thyroid disease Mother   . Alcohol abuse Father   . Asthma Sister   . Depression Sister   . Diabetes Maternal Aunt   . HIV Maternal Aunt   . Cancer Maternal Grandmother 37    COLON  . Depression Maternal Grandmother   . Alcohol abuse Maternal Grandmother   . Drug abuse Maternal Grandmother   . Alcohol abuse Maternal Grandfather   . Drug abuse Maternal Grandfather   . Stroke Maternal Grandfather   . Alcohol  abuse Paternal Grandfather   . Drug abuse Paternal Grandfather   . Asthma Sister     History  Substance Use Topics  . Smoking status: Never Smoker   . Smokeless tobacco: Never Used  . Alcohol Use: No    Allergies: No Known Allergies  Prescriptions prior to admission  Medication Sig Dispense Refill Last Dose  . albuterol (PROVENTIL HFA;VENTOLIN HFA) 108 (90 BASE) MCG/ACT inhaler Inhale 2 puffs into the lungs every 6 (six) hours as needed for wheezing or shortness of breath. 1 Inhaler 2 Taking  . cyclobenzaprine (FLEXERIL) 5 MG tablet Take 1 tablet (5 mg total) by mouth 2 (two) times daily as needed for muscle spasms. (Patient not taking: Reported on 08/29/2014) 20 tablet 0 Not Taking  . ferrous sulfate 325 (65 FE) MG tablet Take 1 tablet (325 mg total) by mouth daily with breakfast. 30 tablet 5 Taking  . folic acid (FOLVITE) 1 MG tablet Take 1 tablet (1 mg total) by mouth daily. (Patient not  taking: Reported on 08/29/2014) 1 tablet 11 Not Taking  . ondansetron (ZOFRAN ODT) 4 MG disintegrating tablet Take 1 tablet (4 mg total) by mouth every 6 (six) hours as needed for nausea. (Patient not taking: Reported on 08/29/2014) 20 tablet 0 Not Taking  . Prenatal Vit-Fe Fumarate-FA (PRENATAL VITAMINS PLUS) 27-1 MG TABS Take 1 tablet by mouth.   Taking  . sertraline (ZOLOFT) 50 MG tablet Take 1 tablet (50 mg total) by mouth daily. 30 tablet 6 Taking    Review of Systems  Constitutional: Negative for fever, chills and malaise/fatigue.  Gastrointestinal: Positive for nausea, vomiting, abdominal pain and diarrhea. Negative for constipation.  Neurological: Negative for dizziness.   Physical Exam   Blood pressure 103/58, pulse 78, temperature 98.9 F (37.2 C), temperature source Oral, resp. rate 18, weight 198 lb (89.812 kg), not currently breastfeeding.  Physical Exam  Constitutional: She is oriented to person, place, and time. She appears well-developed and well-nourished.  HENT:  Head:  Normocephalic.  Cardiovascular: Normal rate.   Respiratory: Effort normal.  GI: Soft.  Musculoskeletal: Normal range of motion.  Neurological: She is alert and oriented to person, place, and time.  Skin: Skin is warm and dry.  Psychiatric: She has a normal mood and affect.   FHR reassuring both twins Uterine irritabilty  MAU Course  Procedures  MDM Terbutaline given per order Dr Ruthann Cancer Contractions stopped then recurred. Wants to go home  Assessment and Plan  A:  Twin IUP       Gastroenteritis       Uterine irritability        No change in cervix  P:  Discharge home       PTL precautions       Come back if persists or worsens         [facility] 09/07/2014, 6:59 PM

## 2014-09-07 NOTE — Discharge Instructions (Signed)

## 2014-09-07 NOTE — MAU Note (Signed)
Vomiting and diarrhea started about an hour ago. No one else at home is sick.

## 2014-09-09 DIAGNOSIS — Z3A34 34 weeks gestation of pregnancy: Secondary | ICD-10-CM | POA: Insufficient documentation

## 2014-09-11 ENCOUNTER — Ambulatory Visit (INDEPENDENT_AMBULATORY_CARE_PROVIDER_SITE_OTHER): Payer: Medicaid Other | Admitting: Obstetrics

## 2014-09-11 VITALS — BP 115/67 | HR 64 | Temp 97.6°F | Wt 200.0 lb

## 2014-09-11 DIAGNOSIS — O30003 Twin pregnancy, unspecified number of placenta and unspecified number of amniotic sacs, third trimester: Secondary | ICD-10-CM

## 2014-09-11 LAB — POCT URINALYSIS DIPSTICK
Bilirubin, UA: NEGATIVE
Blood, UA: NEGATIVE
Glucose, UA: NEGATIVE
Ketones, UA: NEGATIVE
Nitrite, UA: NEGATIVE
PROTEIN UA: NEGATIVE
Spec Grav, UA: 1.01
UROBILINOGEN UA: NEGATIVE
pH, UA: 7

## 2014-09-12 ENCOUNTER — Encounter: Payer: Self-pay | Admitting: Obstetrics

## 2014-09-12 ENCOUNTER — Other Ambulatory Visit: Payer: Self-pay | Admitting: *Deleted

## 2014-09-12 ENCOUNTER — Telehealth: Payer: Self-pay

## 2014-09-12 NOTE — Progress Notes (Signed)
Subjective:    Lindsay Ramirez is a 29 y.o. female being seen today for her obstetrical visit. She is at [redacted]w[redacted]d gestation. Patient reports irregular UC's.  Fetal movement: normal.  Problem List Items Addressed This Visit    None    Visit Diagnoses    Twin gestation in third trimester, unspecified placenta and amniotic sac number    -  Primary    Relevant Orders       POCT urinalysis dipstick       Fetal non-stress test      Patient Active Problem List   Diagnosis Date Noted  . [redacted] weeks gestation of pregnancy   . [redacted] weeks gestation of pregnancy   . Poor fetal growth affecting management of mother in third trimester, antepartum   . [redacted] weeks gestation of pregnancy   . [redacted] weeks gestation of pregnancy   . Decreased fetal movement   . Dichorionic diamniotic twin pregnancy in third trimester   . [redacted] weeks gestation of pregnancy   . Pain aggravated by activities of daily living 07/22/2014  . GBS (group B streptococcus) UTI complicating pregnancy 19/16/6060  . 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 Twin gestation, dichorionic diamniotic 05/04/2014  . Nausea/vomiting in pregnancy 04/30/2014  . Unspecified high-risk pregnancy 04/27/2014  . Migraine without aura 03/25/2014  . 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 Gout 06/15/2013  . Bipolar disorder, unspecified/depression 11/11/2012  . 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 Acid reflux 06/21/2012   Objective:    BP 115/67 mmHg  Pulse 64  Temp(Src) 97.6 F (36.4 C)  Wt 200 lb (90.719 kg)  LMP  FHT:  140x2 BPM  Uterine Size: size greater than dates  Presentation: breech     Assessment:    Pregnancy @ [redacted]w[redacted]d weeks   Plan:     labs reviewed, problem list updated Consent signed. GBS sent TDAP offered  Rhogam given for RH negative Pediatrician: discussed. Infant feeding: plans to breastfeed. Maternity leave: discussed. Cigarette smoking: never smoked. Orders Placed This Encounter  Procedures  . Fetal non-stress  test    Standing Status: Standing     Number of Occurrences: 1     Standing Expiration Date:   . POCT urinalysis dipstick   No orders of the defined types were placed in this encounter.   Follow up in 1 Week.

## 2014-09-12 NOTE — Telephone Encounter (Signed)
SPOKE WITH PATIENT ABOUT JOURNEY'S COUNSELING COMING HERE TO [place]. [facility]TH - PATIENT IS INTERESTED AND SAID WOULD DISCUSS IT WHEN SHE CAME IN ON 09/18/14 TO [facility] APPT - WANTED TO FIND OUT WHEN HER C-SECTION WOULD BE AND TALK TO DR HARPER.

## 2014-09-14 ENCOUNTER — Other Ambulatory Visit: Payer: Self-pay | Admitting: Obstetrics

## 2014-09-14 ENCOUNTER — Ambulatory Visit (HOSPITAL_COMMUNITY)
Admission: RE | Admit: 2014-09-14 | Discharge: 2014-09-14 | Disposition: A | Discharge status: Home / Self Care | Payer: Medicaid Other | Source: Ambulatory Visit | Attending: Obstetrics | Admitting: Obstetrics

## 2014-09-14 ENCOUNTER — Ambulatory Visit (HOSPITAL_COMMUNITY): Payer: Medicaid Other

## 2014-09-14 DIAGNOSIS — Z3A35 35 weeks gestation of pregnancy: Secondary | ICD-10-CM | POA: Insufficient documentation

## 2014-09-14 DIAGNOSIS — O30043 Twin pregnancy, dichorionic/diamniotic, third trimester: Secondary | ICD-10-CM | POA: Insufficient documentation

## 2014-09-14 DIAGNOSIS — O365990 Maternal care for other known or suspected poor fetal growth, unspecified trimester, not applicable or unspecified: Secondary | ICD-10-CM | POA: Insufficient documentation

## 2014-09-14 DIAGNOSIS — O365932 Maternal care for other known or suspected poor fetal growth, third trimester, fetus 2: Secondary | ICD-10-CM | POA: Diagnosis not present

## 2014-09-14 DIAGNOSIS — IMO0001 Reserved for inherently not codable concepts without codable children: Secondary | ICD-10-CM

## 2014-09-15 ENCOUNTER — Encounter (HOSPITAL_COMMUNITY): Payer: Self-pay

## 2014-09-15 NOTE — Patient Instructions (Addendum)
   Your procedure is scheduled on: Tuesday, Jan 12  Enter through the [employer] [facility] at: 9:30 AM Pick up the phone at the desk and dial [phone] and inform us of your arrival.  Please call this number if you have any problems the morning of surgery: [phone]  Remember: Do not eat or drink after midnight: Monday Take these medicines the morning of surgery with a SIP OF WATER: zoloft.  Bring albuterol inhaler with you on day of surgery.  Do not wear jewelry, make-up, or FINGER nail polish No metal in your hair or on your body. Do not wear lotions, powders, perfumes.  You may wear deodorant.  Do not bring valuables to the hospital. Contacts, dentures or bridgework may not be worn into surgery.  Leave suitcase in the car. After Surgery it may be brought to your room. For patients being admitted to the hospital, checkout time is 11:00am the day of discharge.  Home with mother Jenny Reichmann cell [phone]

## 2014-09-15 NOTE — ED Notes (Signed)
Called and spoke with Dr. Jodi Mourning about the need for patient to be delivered at 36 weeks.  He has seen the [place] report and recommendations and has scheduled the patient.

## 2014-09-18 ENCOUNTER — Encounter (HOSPITAL_COMMUNITY)
Admission: RE | Admit: 2014-09-18 | Discharge: 2014-09-18 | Disposition: A | Discharge status: Home / Self Care | Payer: Medicaid Other | Source: Ambulatory Visit | Attending: Obstetrics | Admitting: Obstetrics

## 2014-09-18 ENCOUNTER — Encounter: Payer: Medicaid Other | Admitting: Obstetrics

## 2014-09-18 ENCOUNTER — Encounter (HOSPITAL_COMMUNITY): Payer: Self-pay

## 2014-09-18 HISTORY — DX: Gout, unspecified: M10.9

## 2014-09-18 HISTORY — DX: Missed abortion: O02.1

## 2014-09-18 LAB — CBC
HEMATOCRIT: 29 % — AB (ref 36.0–46.0)
HEMOGLOBIN: 9.4 g/dL — AB (ref 12.0–15.0)
MCH: 24 pg — AB (ref 26.0–34.0)
MCHC: 32.4 g/dL (ref 30.0–36.0)
MCV: 74.2 fL — ABNORMAL LOW (ref 78.0–100.0)
PLATELETS: 203 10*3/uL (ref 150–400)
RBC: 3.91 MIL/uL (ref 3.87–5.11)
RDW: 15.2 % (ref 11.5–15.5)
WBC: 8 10*3/uL (ref 4.0–10.5)

## 2014-09-19 ENCOUNTER — Inpatient Hospital Stay (HOSPITAL_COMMUNITY): Payer: Medicaid Other | Admitting: Anesthesiology

## 2014-09-19 ENCOUNTER — Encounter (HOSPITAL_COMMUNITY)
Admission: RE | Disposition: A | Discharge status: Home / Self Care | Payer: Self-pay | Source: Ambulatory Visit | Attending: Obstetrics

## 2014-09-19 ENCOUNTER — Inpatient Hospital Stay (HOSPITAL_COMMUNITY)
Admission: RE | Admit: 2014-09-19 | Discharge: 2014-09-22 | DRG: 765 | Disposition: A | Discharge status: Home / Self Care | Payer: Medicaid Other | Source: Ambulatory Visit | Attending: Obstetrics | Admitting: Obstetrics

## 2014-09-19 ENCOUNTER — Encounter (HOSPITAL_COMMUNITY): Payer: Self-pay | Admitting: Anesthesiology

## 2014-09-19 DIAGNOSIS — Z3A37 37 weeks gestation of pregnancy: Secondary | ICD-10-CM | POA: Diagnosis present

## 2014-09-19 DIAGNOSIS — O321XX1 Maternal care for breech presentation, fetus 1: Principal | ICD-10-CM | POA: Diagnosis present

## 2014-09-19 DIAGNOSIS — B951 Streptococcus, group B, as the cause of diseases classified elsewhere: Secondary | ICD-10-CM

## 2014-09-19 DIAGNOSIS — IMO0002 Reserved for concepts with insufficient information to code with codable children: Secondary | ICD-10-CM

## 2014-09-19 DIAGNOSIS — O321XX0 Maternal care for breech presentation, not applicable or unspecified: Secondary | ICD-10-CM | POA: Diagnosis present

## 2014-09-19 DIAGNOSIS — O321XX2 Maternal care for breech presentation, fetus 2: Secondary | ICD-10-CM | POA: Diagnosis present

## 2014-09-19 DIAGNOSIS — O30043 Twin pregnancy, dichorionic/diamniotic, third trimester: Secondary | ICD-10-CM | POA: Diagnosis present

## 2014-09-19 DIAGNOSIS — O2343 Unspecified infection of urinary tract in pregnancy, third trimester: Secondary | ICD-10-CM

## 2014-09-19 DIAGNOSIS — O9081 Anemia of the puerperium: Secondary | ICD-10-CM | POA: Diagnosis present

## 2014-09-19 DIAGNOSIS — O365932 Maternal care for other known or suspected poor fetal growth, third trimester, fetus 2: Secondary | ICD-10-CM | POA: Diagnosis present

## 2014-09-19 DIAGNOSIS — O34219 Maternal care for unspecified type scar from previous cesarean delivery: Secondary | ICD-10-CM

## 2014-09-19 DIAGNOSIS — D649 Anemia, unspecified: Secondary | ICD-10-CM | POA: Diagnosis present

## 2014-09-19 DIAGNOSIS — O219 Vomiting of pregnancy, unspecified: Secondary | ICD-10-CM

## 2014-09-19 LAB — PREPARE RBC (CROSSMATCH)

## 2014-09-19 LAB — RPR: RPR Ser Ql: NONREACTIVE

## 2014-09-19 SURGERY — Surgical Case
Anesthesia: Spinal

## 2014-09-19 MED ORDER — ONDANSETRON HCL 4 MG/2ML IJ SOLN
INTRAMUSCULAR | Status: AC
  Filled 2014-09-19: qty 2

## 2014-09-19 MED ORDER — MIDAZOLAM HCL 2 MG/2ML IJ SOLN: 0.5000 mg | Freq: Once | INTRAMUSCULAR | Status: DC | PRN

## 2014-09-19 MED ORDER — PHENYLEPHRINE 8 MG IN D5W 100 ML (0.08MG/ML) PREMIX OPTIME
INJECTION | INTRAVENOUS | Status: DC | PRN
  Administered 2014-09-19: 60 ug/min via INTRAVENOUS

## 2014-09-19 MED ORDER — NALOXONE HCL 0.4 MG/ML IJ SOLN: 0.4000 mg | INTRAMUSCULAR | Status: DC | PRN

## 2014-09-19 MED ORDER — SIMETHICONE 80 MG PO CHEW
80.0000 mg | CHEWABLE_TABLET | ORAL | Status: DC | PRN
  Administered 2014-09-21: 80 mg via ORAL
  Filled 2014-09-19: qty 1

## 2014-09-19 MED ORDER — KETOROLAC TROMETHAMINE 30 MG/ML IJ SOLN: 30.0000 mg | Freq: Four times a day (QID) | INTRAMUSCULAR | Status: DC | PRN

## 2014-09-19 MED ORDER — CEFAZOLIN SODIUM-DEXTROSE 2-3 GM-% IV SOLR
2.0000 g | INTRAVENOUS | Status: AC
Start: 2014-09-19 — End: 2014-09-19
  Administered 2014-09-19: 2 g via INTRAVENOUS

## 2014-09-19 MED ORDER — ONDANSETRON HCL 4 MG/2ML IJ SOLN
INTRAMUSCULAR | Status: DC | PRN
  Administered 2014-09-19: 4 mg via INTRAVENOUS

## 2014-09-19 MED ORDER — OXYCODONE-ACETAMINOPHEN 5-325 MG PO TABS: 1.0000 | ORAL_TABLET | ORAL | Status: DC | PRN

## 2014-09-19 MED ORDER — DIBUCAINE 1 % RE OINT: 1.0000 "application " | TOPICAL_OINTMENT | RECTAL | Status: DC | PRN

## 2014-09-19 MED ORDER — ACETAMINOPHEN 325 MG PO TABS: 325.0000 mg | ORAL_TABLET | ORAL | Status: DC | PRN

## 2014-09-19 MED ORDER — TETANUS-DIPHTH-ACELL PERTUSSIS 5-2.5-18.5 LF-MCG/0.5 IM SUSP: 0.5000 mL | Freq: Once | INTRAMUSCULAR | Status: DC

## 2014-09-19 MED ORDER — KETOROLAC TROMETHAMINE 30 MG/ML IJ SOLN
INTRAMUSCULAR | Status: AC
  Filled 2014-09-19: qty 1

## 2014-09-19 MED ORDER — OXYTOCIN 10 UNIT/ML IJ SOLN
INTRAMUSCULAR | Status: AC
  Filled 2014-09-19: qty 4

## 2014-09-19 MED ORDER — FENTANYL CITRATE 0.05 MG/ML IJ SOLN
25.0000 ug | INTRAMUSCULAR | Status: DC | PRN
  Administered 2014-09-19 (×3): 25 ug via INTRAVENOUS

## 2014-09-19 MED ORDER — DIPHENHYDRAMINE HCL 50 MG/ML IJ SOLN: 12.5000 mg | INTRAMUSCULAR | Status: DC | PRN

## 2014-09-19 MED ORDER — DIPHENHYDRAMINE HCL 25 MG PO CAPS: 25.0000 mg | ORAL_CAPSULE | ORAL | Status: DC | PRN

## 2014-09-19 MED ORDER — ATROPINE SULFATE 0.4 MG/ML IJ SOLN
INTRAMUSCULAR | Status: AC
  Filled 2014-09-19: qty 1

## 2014-09-19 MED ORDER — OXYTOCIN 40 UNITS IN LACTATED RINGERS INFUSION - SIMPLE MED
INTRAVENOUS | Status: DC | PRN
  Administered 2014-09-19: 40 [IU] via INTRAVENOUS

## 2014-09-19 MED ORDER — MEPERIDINE HCL 25 MG/ML IJ SOLN: 6.2500 mg | INTRAMUSCULAR | Status: DC | PRN

## 2014-09-19 MED ORDER — NALBUPHINE HCL 10 MG/ML IJ SOLN: 5.0000 mg | Freq: Once | INTRAMUSCULAR | Status: DC | PRN

## 2014-09-19 MED ORDER — MAGNESIUM HYDROXIDE 400 MG/5ML PO SUSP
30.0000 mL | Freq: Every evening | ORAL | Status: DC | PRN
  Administered 2014-09-21: 30 mL via ORAL
  Filled 2014-09-19: qty 30

## 2014-09-19 MED ORDER — OXYTOCIN 40 UNITS IN LACTATED RINGERS INFUSION - SIMPLE MED
62.5000 mL/h | INTRAVENOUS | Status: AC
Start: 2014-09-19 — End: 2014-09-20

## 2014-09-19 MED ORDER — KETOROLAC TROMETHAMINE 30 MG/ML IJ SOLN: 30.0000 mg | Freq: Once | INTRAMUSCULAR | Status: DC | PRN

## 2014-09-19 MED ORDER — ACETAMINOPHEN 500 MG PO TABS
1000.0000 mg | ORAL_TABLET | Freq: Four times a day (QID) | ORAL | Status: DC
  Administered 2014-09-19: 1000 mg via ORAL
  Filled 2014-09-19: qty 2

## 2014-09-19 MED ORDER — NALBUPHINE HCL 10 MG/ML IJ SOLN
INTRAMUSCULAR | Status: AC
  Filled 2014-09-19: qty 1

## 2014-09-19 MED ORDER — LACTATED RINGERS IV SOLN
INTRAVENOUS | Status: DC
  Administered 2014-09-19 (×2): via INTRAVENOUS

## 2014-09-19 MED ORDER — MENTHOL 3 MG MT LOZG: 1.0000 | LOZENGE | OROMUCOSAL | Status: DC | PRN

## 2014-09-19 MED ORDER — ZOLPIDEM TARTRATE 5 MG PO TABS: 5.0000 mg | ORAL_TABLET | Freq: Every evening | ORAL | Status: DC | PRN

## 2014-09-19 MED ORDER — IBUPROFEN 600 MG PO TABS
600.0000 mg | ORAL_TABLET | Freq: Four times a day (QID) | ORAL | Status: DC
  Administered 2014-09-19 – 2014-09-22 (×11): 600 mg via ORAL
  Filled 2014-09-19 (×12): qty 1

## 2014-09-19 MED ORDER — FENTANYL CITRATE 0.05 MG/ML IJ SOLN
INTRAMUSCULAR | Status: AC
  Filled 2014-09-19: qty 2

## 2014-09-19 MED ORDER — SCOPOLAMINE 1 MG/3DAYS TD PT72
MEDICATED_PATCH | TRANSDERMAL | Status: AC
  Administered 2014-09-19: 1.5 mg via TRANSDERMAL
  Filled 2014-09-19: qty 1

## 2014-09-19 MED ORDER — LANOLIN HYDROUS EX OINT: 1.0000 "application " | TOPICAL_OINTMENT | CUTANEOUS | Status: DC | PRN

## 2014-09-19 MED ORDER — BUPIVACAINE IN DEXTROSE 0.75-8.25 % IT SOLN
INTRATHECAL | Status: DC | PRN
  Administered 2014-09-19: 1.3 mL via INTRATHECAL

## 2014-09-19 MED ORDER — PHENYLEPHRINE 40 MCG/ML (10ML) SYRINGE FOR IV PUSH (FOR BLOOD PRESSURE SUPPORT)
PREFILLED_SYRINGE | INTRAVENOUS | Status: AC
  Filled 2014-09-19: qty 10

## 2014-09-19 MED ORDER — PROMETHAZINE HCL 25 MG/ML IJ SOLN: 6.2500 mg | INTRAMUSCULAR | Status: DC | PRN

## 2014-09-19 MED ORDER — NALOXONE HCL 1 MG/ML IJ SOLN
1.0000 ug/kg/h | INTRAVENOUS | Status: DC | PRN
  Filled 2014-09-19: qty 2

## 2014-09-19 MED ORDER — PHENYLEPHRINE 8 MG IN D5W 100 ML (0.08MG/ML) PREMIX OPTIME
INJECTION | INTRAVENOUS | Status: AC
  Filled 2014-09-19: qty 100

## 2014-09-19 MED ORDER — SCOPOLAMINE 1 MG/3DAYS TD PT72
1.0000 | MEDICATED_PATCH | Freq: Once | TRANSDERMAL | Status: DC
Start: 2014-09-19 — End: 2014-09-19
  Administered 2014-09-19: 1.5 mg via TRANSDERMAL

## 2014-09-19 MED ORDER — ACETAMINOPHEN 160 MG/5ML PO SOLN: 325.0000 mg | ORAL | Status: DC | PRN

## 2014-09-19 MED ORDER — SODIUM CHLORIDE 0.9 % IJ SOLN: 3.0000 mL | INTRAMUSCULAR | Status: DC | PRN

## 2014-09-19 MED ORDER — NALBUPHINE HCL 10 MG/ML IJ SOLN: 5.0000 mg | INTRAMUSCULAR | Status: DC | PRN

## 2014-09-19 MED ORDER — MEPERIDINE HCL 25 MG/ML IJ SOLN
6.2500 mg | INTRAMUSCULAR | Status: DC | PRN
Start: 2014-09-19 — End: 2014-09-19

## 2014-09-19 MED ORDER — SIMETHICONE 80 MG PO CHEW
80.0000 mg | CHEWABLE_TABLET | ORAL | Status: DC
  Administered 2014-09-19 – 2014-09-20 (×2): 80 mg via ORAL
  Filled 2014-09-19 (×3): qty 1

## 2014-09-19 MED ORDER — OXYCODONE-ACETAMINOPHEN 5-325 MG PO TABS
2.0000 | ORAL_TABLET | ORAL | Status: DC | PRN
  Administered 2014-09-19 – 2014-09-22 (×12): 2 via ORAL
  Filled 2014-09-19 (×12): qty 2

## 2014-09-19 MED ORDER — IBUPROFEN 600 MG PO TABS: 600.0000 mg | ORAL_TABLET | Freq: Four times a day (QID) | ORAL | Status: DC | PRN

## 2014-09-19 MED ORDER — SODIUM CHLORIDE 0.9 % IR SOLN
Status: DC | PRN
  Administered 2014-09-19: 1000 mL

## 2014-09-19 MED ORDER — MORPHINE SULFATE 0.5 MG/ML IJ SOLN
INTRAMUSCULAR | Status: AC
  Filled 2014-09-19: qty 10

## 2014-09-19 MED ORDER — ONDANSETRON HCL 4 MG/2ML IJ SOLN: 4.0000 mg | Freq: Three times a day (TID) | INTRAMUSCULAR | Status: DC | PRN

## 2014-09-19 MED ORDER — WITCH HAZEL-GLYCERIN EX PADS: 1.0000 "application " | MEDICATED_PAD | CUTANEOUS | Status: DC | PRN

## 2014-09-19 MED ORDER — KETOROLAC TROMETHAMINE 30 MG/ML IJ SOLN
30.0000 mg | Freq: Four times a day (QID) | INTRAMUSCULAR | Status: DC | PRN
  Administered 2014-09-19: 30 mg via INTRAMUSCULAR

## 2014-09-19 MED ORDER — ONDANSETRON HCL 4 MG PO TABS
4.0000 mg | ORAL_TABLET | ORAL | Status: DC | PRN
  Administered 2014-09-21 – 2014-09-22 (×3): 4 mg via ORAL
  Filled 2014-09-19 (×3): qty 1

## 2014-09-19 MED ORDER — LACTATED RINGERS IV SOLN
INTRAVENOUS | Status: DC
  Administered 2014-09-19: 900 mL via INTRAVENOUS

## 2014-09-19 MED ORDER — CEFAZOLIN SODIUM-DEXTROSE 2-3 GM-% IV SOLR
INTRAVENOUS | Status: AC
  Filled 2014-09-19: qty 50

## 2014-09-19 MED ORDER — SCOPOLAMINE 1 MG/3DAYS TD PT72: 1.0000 | MEDICATED_PATCH | Freq: Once | TRANSDERMAL | Status: DC

## 2014-09-19 MED ORDER — NALBUPHINE HCL 10 MG/ML IJ SOLN
5.0000 mg | INTRAMUSCULAR | Status: DC | PRN
  Administered 2014-09-19: 5 mg via SUBCUTANEOUS

## 2014-09-19 MED ORDER — ONDANSETRON HCL 4 MG/2ML IJ SOLN: 4.0000 mg | INTRAMUSCULAR | Status: DC | PRN

## 2014-09-19 MED ORDER — FENTANYL CITRATE 0.05 MG/ML IJ SOLN
INTRAMUSCULAR | Status: DC | PRN
  Administered 2014-09-19: 25 ug via INTRATHECAL

## 2014-09-19 MED ORDER — PRENATAL MULTIVITAMIN CH
1.0000 | ORAL_TABLET | Freq: Every day | ORAL | Status: DC
  Administered 2014-09-20 – 2014-09-22 (×3): 1 via ORAL
  Filled 2014-09-19 (×3): qty 1

## 2014-09-19 MED ORDER — SIMETHICONE 80 MG PO CHEW
80.0000 mg | CHEWABLE_TABLET | Freq: Three times a day (TID) | ORAL | Status: DC
  Administered 2014-09-20 – 2014-09-22 (×6): 80 mg via ORAL
  Filled 2014-09-19 (×6): qty 1

## 2014-09-19 MED ORDER — SENNOSIDES-DOCUSATE SODIUM 8.6-50 MG PO TABS
2.0000 | ORAL_TABLET | ORAL | Status: DC
  Administered 2014-09-19 – 2014-09-21 (×3): 2 via ORAL
  Filled 2014-09-19 (×3): qty 2

## 2014-09-19 MED ORDER — DIPHENHYDRAMINE HCL 25 MG PO CAPS
25.0000 mg | ORAL_CAPSULE | Freq: Four times a day (QID) | ORAL | Status: DC | PRN
  Administered 2014-09-19 – 2014-09-20 (×4): 25 mg via ORAL
  Filled 2014-09-19 (×4): qty 1

## 2014-09-19 MED ORDER — LACTATED RINGERS IV SOLN
INTRAVENOUS | Status: DC | PRN
  Administered 2014-09-19: 11:00:00 via INTRAVENOUS

## 2014-09-19 MED ORDER — MORPHINE SULFATE (PF) 0.5 MG/ML IJ SOLN
INTRAMUSCULAR | Status: DC | PRN
  Administered 2014-09-19: .15 mg via INTRATHECAL

## 2014-09-19 SURGICAL SUPPLY — 33 items
CLAMP CORD UMBIL (MISCELLANEOUS) ×4 IMPLANT
CLOTH BEACON ORANGE TIMEOUT ST (SAFETY) ×2 IMPLANT
DRAPE SHEET LG 3/4 BI-LAMINATE (DRAPES) ×2 IMPLANT
DRSG OPSITE POSTOP 4X10 (GAUZE/BANDAGES/DRESSINGS) ×2 IMPLANT
DURAPREP 26ML APPLICATOR (WOUND CARE) ×2 IMPLANT
ELECT REM PT RETURN 9FT ADLT (ELECTROSURGICAL) ×3
ELECTRODE REM PT RTRN 9FT ADLT (ELECTROSURGICAL) IMPLANT
GLOVE BIO SURGEON STRL SZ 6.5 (GLOVE) ×1 IMPLANT
GLOVE BIO SURGEON STRL SZ8 (GLOVE) ×2 IMPLANT
GLOVE BIO SURGEONS STRL SZ 6.5 (GLOVE) ×1
GLOVE BIOGEL PI IND STRL 7.0 (GLOVE) IMPLANT
GLOVE BIOGEL PI IND STRL 7.5 (GLOVE) IMPLANT
GLOVE BIOGEL PI INDICATOR 7.0 (GLOVE) ×6
GLOVE BIOGEL PI INDICATOR 7.5 (GLOVE) ×2
GLOVE ECLIPSE 7.0 STRL STRAW (GLOVE) ×2 IMPLANT
GOWN STRL REUS W/TWL LRG LVL3 (GOWN DISPOSABLE) ×8 IMPLANT
KIT ABG SYR 3ML LUER SLIP (SYRINGE) ×2 IMPLANT
NDL HYPO 25X5/8 SAFETYGLIDE (NEEDLE) IMPLANT
NEEDLE HYPO 25X5/8 SAFETYGLIDE (NEEDLE) ×3 IMPLANT
NS IRRIG 1000ML POUR BTL (IV SOLUTION) ×2 IMPLANT
PACK C SECTION WH (CUSTOM PROCEDURE TRAY) ×2 IMPLANT
PAD OB MATERNITY 4.3X12.25 (PERSONAL CARE ITEMS) ×2 IMPLANT
RTRCTR C-SECT PINK 25CM LRG (MISCELLANEOUS) ×2 IMPLANT
STAPLER VISISTAT 35W (STAPLE) ×2 IMPLANT
SUT MNCRL 0 VIOLET CTX 36 (SUTURE) IMPLANT
SUT MON AB 2-0 CT1 27 (SUTURE) ×2 IMPLANT
SUT MONOCRYL 0 CTX 36 (SUTURE) ×6
SUT PDS AB 0 CTX 60 (SUTURE) ×4 IMPLANT
SUT PLAIN 2 0 XLH (SUTURE) ×2 IMPLANT
SUT VIC AB 2-0 CT1 27 (SUTURE) ×3
SUT VIC AB 2-0 CT1 TAPERPNT 27 (SUTURE) IMPLANT
TOWEL OR 17X24 6PK STRL BLUE (TOWEL DISPOSABLE) ×4 IMPLANT
TRAY FOLEY CATH 14FR (SET/KITS/TRAYS/PACK) ×2 IMPLANT

## 2014-09-19 NOTE — Op Note (Signed)
Cesarean Section Procedure Note   STEPANIE GRAVER   09/19/2014  Indications: Breech Presentation   Pre-operative Diagnosis: Twin pregnancy, 37 wks/ Breech.  IUGR and Elevated UA Dopplers- Baby B   Post-operative Diagnosis: Same   Surgeon: Baltazar Najjar A  Assistants: Gracy Racer  Anesthesia: spinal  Procedure Details:  The patient was seen in the Holding Room. The risks, benefits, complications, treatment options, and expected outcomes were discussed with the patient. The patient concurred with the proposed plan, giving informed consent. The patient was identified as Lindsay Ramirez and the procedure verified as C-Section Delivery. A Time Out was held and the above information confirmed.  After induction of anesthesia, the patient was draped and prepped in the usual sterile manner. A transverse incision was made and carried down through the subcutaneous tissue to the fascia. The fascial incision was made and extended transversely. The fascia was separated from the underlying rectus tissue superiorly and inferiorly. The peritoneum was identified and entered. The peritoneal incision was extended longitudinally. The utero-vesical peritoneal reflection was incised transversely and the bladder flap was bluntly freed from the lower uterine segment. A low transverse uterine incision was made.  Baby A:  Delivered from breech presentation was a 2290 gram living newborn female infant(s).  Baby B:  Delivered from breech presentation was a 2750 gram female infant.  APGAR (1 MIN):    [name], [name] [[id number]]  [address] [place] [[id number]]  8   APGAR (5 MINS):    [name], [name] [[id number]]  [address] [place] [[id number]]  9   APGAR (10 MINS):    [name] [[id number]]    [name] [[id number]]      A cord ph was not sent. The umbilical cord was clamped and cut cord. A sample was obtained for evaluation. The placenta was  removed Intact and appeared normal.  The uterine incision was closed with running locked sutures of 0 Monocryl. A second imbricating layer of the same suture was placed.  Hemostasis was observed. The  The bladder flap closed with running suture of 3-0 Vicryl.   Paracolic gutters were irrigated. The parieto peritoneum was closed in a running fashion with 2-0 Vicryl.  The fascia was then reapproximated with running sutures of 0 vicryl.  The skin was closed with staples.  Instrument, sponge, and needle counts were correct prior the abdominal closure and were correct at the conclusion of the case.    Findings:  Normal uterus, ovaries and tubes.   Estimated Blood Loss:  765ml  Total IV Fluids: 2470ml   Urine Output: 100CC OF clear urine  Specimens: Placenta to pathology  Complications: no complications  Disposition: PACU - hemodynamically stable.  Maternal Condition: stable   Baby condition / location:  Couplet care / Skin to Skin    Signed: Surgeon(s): Shelly Bombard, MD Frederico Hamman, MD

## 2014-09-19 NOTE — Transfer of Care (Signed)
Immediate Anesthesia Transfer of Care Note  Patient: Lindsay Ramirez  Procedure(s) Performed: Procedure(s): CESAREAN SECTION MULTI-GESTATIONAL (N/A)  Patient Location: PACU  Anesthesia Type:Spinal  Level of Consciousness: awake, alert  and oriented  Airway & Oxygen Therapy: Patient Spontanous Breathing  Post-op Assessment: Report given to PACU RN and Post -op Vital signs reviewed and stable  Post vital signs: Reviewed and stable  Complications: No apparent anesthesia complications

## 2014-09-19 NOTE — Anesthesia Procedure Notes (Signed)
Spinal Patient location during procedure: OR Start time: 09/19/2014 11:02 AM Staffing Anesthesiologist: Jalayia Bagheri A. Performed by: anesthesiologist  Preanesthetic Checklist Completed: patient identified, site marked, surgical consent, pre-op evaluation, timeout performed, IV checked, risks and benefits discussed and monitors and equipment checked Spinal Block Patient position: sitting Prep: site prepped and draped and DuraPrep Patient monitoring: heart rate, cardiac monitor, continuous pulse ox and blood pressure Approach: midline Location: L4-5 Injection technique: single-shot Needle Needle type: Sprotte  Needle gauge: 24 G Needle length: 9 cm Needle insertion depth: 7 cm Assessment Sensory level: T4 Additional Notes Patient tolerated procedure well. Adequate sensory level.

## 2014-09-19 NOTE — H&P (Signed)
Lindsay Ramirez is a 29 y.o. female presenting for Cesarean Section for Twins.  Breech presentation.  36 weeks with IUGR twin Band poor doppler studies. Maternal Medical History:  Fetal activity: Perceived fetal activity is normal.   Last perceived fetal movement was within the past hour.    Prenatal complications: no prenatal complications Prenatal Complications - Diabetes: none.    OB History    Gravida Para Term Preterm AB TAB SAB Ectopic Multiple Living   6 4 3 1 1  0 1 0 0 4     Past Medical History  Diagnosis Date  . Sickle cell trait   . Mental disorder   . Bipolar affective disorder   . Bipolar affective disorder 2006    TOOK MEDS X 3 YR  . Depression     PP 2013  . Abnormal Pap smear     LAST PAP 06/2011  . Infection     Hx -UTI  . Anemia     CHRONIC  . Vaginal Pap smear, abnormal   . SVD (spontaneous vaginal delivery)     x 4  . Missed ab 10/2013  . Asthma     rarely uses inhaler,  [facility]  . Gout     feet - no meds  . Headache(784.0)     otc med prn   Past Surgical History  Procedure Laterality Date  . Mouth surgery      wisdom teeth ext  . Wisdom tooth extraction  2012  . Vaginal delivery      x 4  . Dilation and evacuation N/A 11/04/2013    Procedure: DILATATION AND EVACUATION;  Surgeon: Alwyn Pea, MD;  Location: [facility];  Service: Gynecology;  Laterality: N/A;  . Dilation and curettage of uterus     Family History: family history includes Alcohol abuse in her father, maternal grandfather, maternal grandmother, and paternal grandfather; Asthma in her mother, sister, and sister; Cancer (age of onset: 93) in her maternal grandmother; Depression in her maternal grandmother and sister; Diabetes in her maternal aunt; Drug abuse in her maternal grandfather, maternal grandmother, and paternal grandfather; HIV in her maternal aunt; Heart disease in her mother; Hyperlipidemia in her mother; Hypertension in her mother; Stroke in her maternal  grandfather; Thyroid disease in her mother. Social History:  reports that she has never smoked. She has never used smokeless tobacco. She reports that she does not drink alcohol or use illicit drugs.   Prenatal Transfer Tool  Maternal Diabetes: No Genetic Screening: Normal Maternal Ultrasounds/Referrals: Normal Fetal Ultrasounds or other Referrals:  None Maternal Substance Abuse:  No Significant Maternal Medications:  None Significant Maternal Lab Results:  None Other Comments:  None  Review of Systems  All other systems reviewed and are negative.     Blood pressure 111/63, pulse 57, temperature 97.7 F (36.5 C), temperature source Oral, resp. rate 18, SpO2 99 %, not currently breastfeeding. Maternal Exam:  Abdomen: Patient reports no abdominal tenderness. Fetal presentation: breech  Introitus: Normal vulva. Normal vagina.    Physical Exam  Nursing note and vitals reviewed. Constitutional: She is oriented to person, place, and time. She appears well-developed and well-nourished.  HENT:  Head: Normocephalic and atraumatic.  Eyes: Conjunctivae are normal. Pupils are equal, round, and reactive to light.  Neck: Normal range of motion.  Cardiovascular: Normal rate and regular rhythm.   Respiratory: Effort normal.  GI: Soft.  Neurological: She is alert and oriented to person, place, and time.  Skin: Skin  is warm and dry.  Psychiatric: She has a normal mood and affect. Her behavior is normal. Judgment and thought content normal.    Prenatal labs: ABO, Rh: --/--/O POS (01/11 0810) Antibody: NEG (01/11 0810) Rubella: 4.44 (08/20 1426) RPR: Non Reactive (01/11 0810)  HBsAg: NEGATIVE (08/20 1426)  HIV: NONREACTIVE (10/29 1048)  GBS: Detected (10/29 1136)   Assessment/Plan: 36 weeks.  Twins.  IUGR and elevated UA Dopplers Twin B.Plan primary C/S.   HARPER,CHARLES A 09/19/2014, 10:19 AM

## 2014-09-19 NOTE — Anesthesia Preprocedure Evaluation (Signed)
Anesthesia Evaluation  Patient identified by MRN, date of birth, ID band Patient awake    Reviewed: Allergy & Precautions, H&P , NPO status , Patient's Chart, lab work & pertinent test results  Airway Mallampati: III       Dental   Pulmonary asthma ,  breath sounds clear to auscultation        Cardiovascular Exercise Tolerance: Good Rhythm:regular Rate:Normal     Neuro/Psych  Headaches, PSYCHIATRIC DISORDERS Depression Bipolar Disorder    GI/Hepatic GERD-  ,  Endo/Other    Renal/GU      Musculoskeletal   Abdominal   Peds  Hematology  (+) anemia ,   Anesthesia Other Findings Sickle cell trait  Reproductive/Obstetrics (+) Pregnancy                             Anesthesia Physical Anesthesia Plan  ASA: II  Anesthesia Plan: Spinal   Post-op Pain Management:    Induction:   Airway Management Planned:   Additional Equipment:   Intra-op Plan:   Post-operative Plan:   Informed Consent: I have reviewed the patients History and Physical, chart, labs and discussed the procedure including the risks, benefits and alternatives for the proposed anesthesia with the patient or authorized representative who has indicated his/her understanding and acceptance.     Plan Discussed with: Anesthesiologist, CRNA and Surgeon  Anesthesia Plan Comments:         Anesthesia Quick Evaluation

## 2014-09-19 NOTE — Anesthesia Postprocedure Evaluation (Signed)
Anesthesia Post Note  Patient: Lindsay Ramirez  Procedure(s) Performed: Procedure(s) (LRB): CESAREAN SECTION MULTI-GESTATIONAL (N/A)  Anesthesia type: Spinal  Patient location: PACU  Post pain: Pain level controlled  Post assessment: Post-op Vital signs reviewed  Last Vitals:  Filed Vitals:   09/19/14 1245  BP: 100/48  Pulse: 58  Temp:   Resp: 20    Post vital signs: Reviewed  Level of consciousness: awake  Complications: No apparent anesthesia complications

## 2014-09-20 ENCOUNTER — Encounter (HOSPITAL_COMMUNITY): Payer: Self-pay | Admitting: Obstetrics

## 2014-09-20 LAB — CBC
HCT: 25.5 % — ABNORMAL LOW (ref 36.0–46.0)
Hemoglobin: 8.4 g/dL — ABNORMAL LOW (ref 12.0–15.0)
MCH: 24.6 pg — AB (ref 26.0–34.0)
MCHC: 32.9 g/dL (ref 30.0–36.0)
MCV: 74.6 fL — ABNORMAL LOW (ref 78.0–100.0)
Platelets: 185 10*3/uL (ref 150–400)
RBC: 3.42 MIL/uL — ABNORMAL LOW (ref 3.87–5.11)
RDW: 15.3 % (ref 11.5–15.5)
WBC: 10 10*3/uL (ref 4.0–10.5)

## 2014-09-20 NOTE — Addendum Note (Signed)
Addendum  created 09/20/14 1740 by Raenette Rover, CRNA   Modules edited: Notes Section   Notes Section:  File: [id number]

## 2014-09-20 NOTE — Progress Notes (Signed)
Subjective: Postpartum Day 1: Cesarean Delivery Patient reports tolerating PO.    Objective: Vital signs in last 24 hours: Temp:  [97.5 F (36.4 C)-98.7 F (37.1 C)] 97.9 F (36.6 C) (01/13 0651) Pulse Rate:  [43-61] 43 (01/13 0651) Resp:  [11-21] 18 (01/13 0651) BP: (98-119)/(48-87) 111/64 mmHg (01/13 0651) SpO2:  [98 %-100 %] 100 % (01/13 0651)  Physical Exam:  General: alert and no distress Lochia: appropriate Uterine Fundus: firm Incision: healing well DVT Evaluation: No evidence of DVT seen on physical exam.   Recent Labs  09/18/14 0810 09/20/14 0603  HGB 9.4* 8.4*  HCT 29.0* 25.5*    Assessment/Plan: Status post Cesarean section. Doing well postoperatively.  Anemia.  Chronic.  Clinically stable. Continue current care.  HARPER,CHARLES A 09/20/2014, 8:27 AM

## 2014-09-20 NOTE — Anesthesia Postprocedure Evaluation (Signed)
  Anesthesia Post-op Note  Patient: Lindsay Ramirez  Procedure(s) Performed: Procedure(s): CESAREAN SECTION MULTI-GESTATIONAL (N/A)  Patient Location: Mother/Baby  Anesthesia Type:Spinal  Level of Consciousness: awake, alert , oriented and patient cooperative  Airway and Oxygen Therapy: Patient Spontanous Breathing  Post-op Pain: mild  Post-op Assessment: Post-op Vital signs reviewed, Patient's Cardiovascular Status Stable, Respiratory Function Stable, Patent Airway, No headache, No backache, No residual numbness and No residual motor weakness  Post-op Vital Signs: Reviewed and stable  Last Vitals:  Filed Vitals:   09/20/14 0651  BP: 111/64  Pulse: 43  Temp: 36.6 C  Resp: 18    Complications: No apparent anesthesia complications

## 2014-09-21 NOTE — Progress Notes (Signed)
Pt complaining of pain at 10/10.  This RN explained that sharp pain is often due to gas and constipation.  Patient told that walking, prune and apple juice warmed, avoiding carbonated beverages (which pt has been drinking up to this time)and symethicone would help more than percocet as percocet could make the problem worse.  Patient still requested 2 percocets, but agrreed to try warmed prune and apple juice, walking and drinking no more soda.  Will continue to encourage patient to walk, give symethicone, offer prune and apple juice, encourage a healthy diet, and provide other pain reducing cares.

## 2014-09-21 NOTE — Progress Notes (Signed)
Patient alone without help for the past two nights.  Poor sleep.  Ambulating frequently to care for newborns.  I encouraged patient several times throughout this shift to rest and elevate feet to reduce edema and pain, but patient has not.  I also offered several times throughout this shift to watch babies in nursery for 1-2 hours to allow patient to rest, but patient declined.   Patient has newborns resting supine on a pillow next to her in the bed.  I requested patient allow me to move newborns to their bassinets as she is drowsy and risks falling asleep with them in this position, patient refused.  Cristal Generous RN 09/21/14 [phone]

## 2014-09-21 NOTE — Progress Notes (Signed)
Subjective: Postpartum Day 2: Cesarean Delivery Patient reports tolerating PO, + flatus and no problems voiding.    Objective: Vital signs in last 24 hours: Temp:  [98 F (36.7 C)-98.4 F (36.9 C)] 98 F (36.7 C) (01/14 0614) Pulse Rate:  [47-50] 47 (01/14 0614) Resp:  [16-18] 16 (01/14 0614) BP: (112-116)/(56-71) 116/71 mmHg (01/14 0614) Weight:  [200 lb (90.719 kg)] 200 lb (90.719 kg) (01/14 0700)  Physical Exam:  General: alert and no distress Lochia: appropriate Uterine Fundus: firm Incision: healing well DVT Evaluation: No evidence of DVT seen on physical exam.   Recent Labs  09/20/14 0603  HGB 8.4*  HCT 25.5*    Assessment/Plan: Status post Cesarean section. Doing well postoperatively.  Continue current care.  HARPER,CHARLES A 09/21/2014, 8:58 AM

## 2014-09-21 NOTE — Progress Notes (Signed)
Clinical Social Work Department PSYCHOSOCIAL ASSESSMENT - MATERNAL/CHILD 09/21/2014  Patient:  Lindsay Ramirez, Lindsay Ramirez  Account Number:  [id number]  Admit Date:  09/19/2014  Ardine Eng Name:   Cory Roughen and [place] Worker:  Lucita Ferrara, CLINICAL SOCIAL WORKER   Date/Time:  09/21/2014 08:45 AM  Date Referred:  09/19/2014   Referral source  [facility]     Referred reason  Behavioral Health Issues   Other referral source:    I:  FAMILY / [place] legal guardian:  PARENT  Guardian - Name Guardian - Age Guardian - Address  [place] [address] [place], [place] [address]  Glenna Fellows  currently incarcerated   Other household support members/support persons Name Relationship DOB   DAUGHTER 68 years old   DAUGHTER 12 years old   SON 72 years old   Other support:   MOB reported that her family members live in [place], but she stated that she has minimal support from her family members. She shared belief that she would be primarily a single parent since the FOB is in jail.    II  PSYCHOSOCIAL DATA Information Source:  Patient Interview  Insurance risk surveyor Resources Employment:   MOB is unemployed.  FOB was employed until he was incarcerated a week and a half a go.   Financial resources:  Medicaid If Medicaid - County:  [place] / Grade:  N/A Music therapist / Child Services Coordination / Early Interventions:   MOB reported that she has a Airline pilot.  Cultural issues impacting care:   None reported.    III  STRENGTHS Strengths  Adequate Resources  Home prepared for Child (including basic supplies)   Strength comment:    IV  RISK FACTORS AND CURRENT PROBLEMS Current Problem:  YES   Risk Factor & Current Problem Patient Issue Family Issue Risk Factor / Current Problem Comment  Mental Illness Y N MOB presents with history of bipolar, depression, and anxiety.  MOB has a rx for  Zoloft, but has not been compliant.  The MOB has received a referral for therapy, but she stated that she has never received a call from the provider. MOB endorsed symptoms of depression during the pregnancy and presents with a flattened affect.    V  SOCIAL WORK ASSESSMENT CSW met with the MOB due to mental health history.  MOB was receptive to the visit and was easily engaged.  She was observed to be attending to and bonding with the baby, and was noted to smile when she was looking at them.  When the MOB was not looking at/interacting with her babies, she presented in a depressed mood with a limited range in affect.    MOB openly acknowledged history of depression.  She reported "long history" and discussed that she is "used to it".  She stated that she has 3 other children at home (ages 67,3,2) and is primarily a single mother since the FOB was incarcerated about 10 days ago.  She shared that it was due to "issues from last year catching up with him", and endorsed intense stress since she is now "alone".  MOB discussed that she is unable to pay his bail, and shared that her family will not assist her since they do not think that he is a good person.  She reported limited support from her family.   MOB presents with few plans on how to increase support  or approach the postpartum period, and stated that she is going to just do the "best I can" and take it "day by day".    CSW reviewed the MOB's mental health that had been documented in the MOB's chart since April 2014.  MOB minimally acknowledged the statements, but did endorse their accuracy.  CSW reviewed the MOB's SI with plan to overdose in November 2014.  MOB shared that it was 6 months postpartum, and stated that she gave up this baby for adoption to her mother since "my mother really wanted a baby".  The MOB expressed strong regret for this decision and shared that it was the "worst decision I've ever made".  She shared that she believes giving that  baby up for adoption was the biggest contributing factor to her depression.  She continued to acknowledge during the pregnancy, and shared that it was overwhelming for her when she was learned that she was expecting twins since she was unsure how she was going to be prepared to care for 5 children.  She stated that the stress worsened again once the FOB was incarcerated.  She stated that she feels "better" now that the twins have arrived, and shared that she is glad that she has them in her life.    The MOB acknowledged rx for Zoloft, but stated that she often does not take it. She was unable to identify a specific reason for non-compliance, but she also shared belief that "it wasn't going to work" since she had a prior history of ineffective medication.  The MOB acknowledged that she does not like feeling depressed since she is missing out on opportunities to interact and play with her children since she notes that sometimes she is tearful and distracted when she attempts to play with them.  The MOB had a difficult time imagining a life without depression given the chronic nature of her symptoms, but she also stated that she would prefer to not be depressed.  She stated that is willing to re-try Zoloft since she acknowledges that there are minimal risks.  The MOB also acknowledged that her OB made a referral to [facility] for therapy, but stated that she never received a phone call from the agency.  MOB provided consent for CSW to contact the agency to check status of the referral.  CSW notes that the MOB would benefit from therapy since she has underdeveloped emotional regulation skills. She was unable to identify other coping skills besides "focusing on my children" and "playing with my children".   CSW continued to explore with the MOB how to effectively engage in the present moment in order to reduce anxiety about the past or the future.   The MOB reported that she does have all basic items for  the baby and shared that all of her children have all basic needs met.  CSW highlighted the strength of the MOB to be able to ensure that all of her children are cared for.   No barriers to discharge. VI SOCIAL WORK PLAN Social Work Secretary/administrator Education  Information/Referral to [employer]  No Further Intervention Required / No Barriers to Discharge   Type of pt/family education:   Postpartum depression. MOB acknowledged that she has numerous risk factors for PPD, and stated that she is unsure if she has a history since "I'm always depressed".     If child protective services report - county:  N/A If child protective services report - date:  N/A  Information/referral to community resources comment:   CSW re-referred the MOB to [facility].  CSW spoke to scheduler at [facility] who stated that they will contact the MOB to schedule an appointment. CSW will refer to [facility].    Other social work plan:   CSW to follow up PRN.

## 2014-09-22 LAB — TYPE AND SCREEN
ABO/RH(D): O POS
ANTIBODY SCREEN: NEGATIVE
UNIT DIVISION: 0
Unit division: 0

## 2014-09-22 MED ORDER — IBUPROFEN 600 MG PO TABS: 600.0000 mg | ORAL_TABLET | Freq: Four times a day (QID) | ORAL | Status: DC | PRN

## 2014-09-22 MED ORDER — FUSION PLUS PO CAPS: 1.0000 | ORAL_CAPSULE | Freq: Every day | ORAL | Status: DC

## 2014-09-22 MED ORDER — OXYCODONE-ACETAMINOPHEN 5-325 MG PO TABS: 2.0000 | ORAL_TABLET | ORAL | Status: DC | PRN

## 2014-09-22 MED ORDER — BISACODYL 10 MG RE SUPP
10.0000 mg | Freq: Once | RECTAL | Status: AC
  Administered 2014-09-22: 10 mg via RECTAL
  Filled 2014-09-22: qty 1

## 2014-09-22 NOTE — Discharge Summary (Signed)
Obstetric Discharge Summary Reason for Admission: cesarean section Prenatal Procedures: NST and ultrasound Intrapartum Procedures: cesarean: low cervical, transverse Postpartum Procedures: none Complications-Operative and Postpartum: none HEMOGLOBIN  Date Value Ref Range Status  09/20/2014 8.4* 12.0 - 15.0 g/dL Final   HCT  Date Value Ref Range Status  09/20/2014 25.5* 36.0 - 46.0 % Final    Physical Exam:  General: alert and no distress Lochia: appropriate Uterine Fundus: firm Incision: healing well DVT Evaluation: No evidence of DVT seen on physical exam.  Discharge Diagnoses: Term Pregnancy-delivered  Discharge Information: Date: 09/22/2014 Activity: pelvic rest Diet: routine Medications: PNV, Ibuprofen and Colace Iron and Percocet Condition: stable Instructions: refer to practice specific booklet Discharge to: home Follow-up Information    Follow up with HARPER,CHARLES A, MD In 4 days.   Specialty:  Obstetrics and Gynecology   Why:  For bandage and staple removal on Tuesday.   Contact information:   [address] [place] [address] [phone]       Newborn Data:   Vance, Belcourt [[id number]]  Live born female  Birth Weight: 5 lb 0.8 oz (2290 g) APGAR: 8, [address] [[id number]]  Live born female  Birth Weight: 4 lb 11.8 oz (2150 g) APGAR: 8, 9  Home with mother.  HARPER,CHARLES A 09/22/2014, 9:16 AM

## 2014-09-22 NOTE — Progress Notes (Signed)
Subjective: Postpartum Day 3: Cesarean Delivery Patient reports tolerating PO, + flatus, + BM and no problems voiding.    Objective: Vital signs in last 24 hours: Temp:  [97.7 F (36.5 C)-98.6 F (37 C)] 97.7 F (36.5 C) (01/15 4356) Pulse Rate:  [47-50] 47 (01/15 0633) Resp:  [18] 18 (01/15 0633) BP: (115-120)/(69-74) 115/74 mmHg (01/15 8616)  Physical Exam:  General: alert and no distress Lochia: appropriate Uterine Fundus: firm Incision: healing well DVT Evaluation: No evidence of DVT seen on physical exam.   Recent Labs  09/20/14 0603  HGB 8.4*  HCT 25.5*    Assessment/Plan: Status post Cesarean section. Doing well postoperatively.  Anemia.  Clinically stable.  Iron Rx. Discharge home with standard precautions and return to clinic in 2 weeks.  Taegan Standage A 09/22/2014, 9:07 AM

## 2014-09-26 ENCOUNTER — Other Ambulatory Visit (HOSPITAL_COMMUNITY): Payer: Medicaid Other

## 2014-09-26 ENCOUNTER — Ambulatory Visit (INDEPENDENT_AMBULATORY_CARE_PROVIDER_SITE_OTHER): Payer: Medicaid Other | Admitting: Obstetrics

## 2014-09-26 DIAGNOSIS — O99345 Other mental disorders complicating the puerperium: Secondary | ICD-10-CM

## 2014-09-26 DIAGNOSIS — F53 Postpartum depression: Secondary | ICD-10-CM

## 2014-09-27 ENCOUNTER — Encounter: Payer: Self-pay | Admitting: Obstetrics

## 2014-09-27 NOTE — Progress Notes (Signed)
Subjective:     Lindsay Ramirez is a 29 y.o. female who presents for a postpartum visit. She is 1 week postpartum following a low cervical transverse Cesarean section. I have fully reviewed the prenatal and intrapartum course. The delivery was at 65 gestational weeks. Outcome: primary cesarean section, low transverse incision. Anesthesia: spinal. Postpartum course has been normal. Baby's course has been normal. Baby is feeding by breast. Bleeding thin lochia. Bowel function is normal. Bladder function is normal. Patient is not sexually active. Contraception method is abstinence. Postpartum depression screening: negative.  Tobacco, alcohol and substance abuse history reviewed.  Adult immunizations reviewed including TDAP, rubella and varicella.  The following portions of the patient's history were reviewed and updated as appropriate: allergies, current medications, past family history, past medical history, past social history, past surgical history and problem list.  Review of Systems A comprehensive review of systems was negative except for: Behavioral/Psych: positive for depression   Objective:    BP 123/74 mmHg  Pulse 55  Temp(Src) 98.5 F (36.9 C)  Wt 189 lb (85.73 kg)  LMP    PE:      Abdomen:  Soft, NT.  Incision C, D, I.                          Staples removed and steri strips applied. Assessment:    Postpartum, 1 week.  Doing well.  Depression, stable.  Plan:    1. Contraception: abstinence 2. Counseling done today for depression. 3. Follow up in: 2 weeks or as needed.   Healthy lifestyle practices reviewed

## 2014-10-09 ENCOUNTER — Encounter: Payer: Self-pay | Admitting: Obstetrics

## 2014-10-09 ENCOUNTER — Ambulatory Visit (INDEPENDENT_AMBULATORY_CARE_PROVIDER_SITE_OTHER): Payer: Medicaid Other | Admitting: Obstetrics

## 2014-10-09 DIAGNOSIS — R52 Pain, unspecified: Secondary | ICD-10-CM

## 2014-10-09 MED ORDER — OXYCODONE-ACETAMINOPHEN 10-325 MG PO TABS: 1.0000 | ORAL_TABLET | ORAL | Status: DC | PRN

## 2014-10-09 NOTE — Progress Notes (Signed)
Subjective:     Lindsay Ramirez is a 29 y.o. female who presents for a postpartum visit. She is 2 weeks postpartum following a low cervical transverse Cesarean section. I have fully reviewed the prenatal and intrapartum course. The delivery was at 27 gestational weeks. Outcome: repeat cesarean section, low transverse incision. Anesthesia: spinal. Postpartum course has been normal. Baby's course has been normal. Baby is feeding by not asked. Bleeding thin lochia. Bowel function is normal. Bladder function is normal. Patient is not sexually active. Contraception method is abstinence. Postpartum depression screening: negative.  Tobacco, alcohol and substance abuse history reviewed.  Adult immunizations reviewed including TDAP, rubella and varicella.  The following portions of the patient's history were reviewed and updated as appropriate: allergies, current medications, past family history, past medical history, past social history, past surgical history and problem list.  Review of Systems A comprehensive review of systems was negative except for: Behavioral/Psych: positive for depression   Objective:    BP 121/62 mmHg  Pulse 60  Temp(Src) 98.1 F (36.7 C)  Wt 180 lb (81.647 kg)  PE:        Abdomen:  Soft, nontender.  Incision C, D, I.   Assessment:     Normal postpartum exam.    Contraceptive counseling.  Wants Nexplanon.   Depression.  Improved on meds.  Plan:    1. Contraception: Nexplanon 2. Nexplanon Rx 3. Follow up in: 2 weeks or as needed.  2hr GTT for h/o GDM/screening for DM q 3 yrs per ADA recommendations Preconception counseling provided Healthy lifestyle practices reviewed

## 2014-10-12 NOTE — Telephone Encounter (Signed)
Task completed

## 2014-10-12 NOTE — Telephone Encounter (Deleted)
No notes

## 2014-10-17 ENCOUNTER — Telehealth: Payer: Self-pay | Admitting: *Deleted

## 2014-10-17 ENCOUNTER — Encounter: Payer: Self-pay | Admitting: *Deleted

## 2014-10-17 NOTE — Telephone Encounter (Signed)
Patient called and wants to know when she can go back to work. Per Dr Jodi Mourning- he would like patient to stay out 8 weeks given she had C- section with twins. Patient requested a letter stating that because she has no income at this time. Letter composed for patient to pick up.

## 2014-10-25 ENCOUNTER — Encounter: Payer: Self-pay | Admitting: Obstetrics

## 2014-10-25 ENCOUNTER — Ambulatory Visit (INDEPENDENT_AMBULATORY_CARE_PROVIDER_SITE_OTHER): Payer: Medicaid Other | Admitting: Obstetrics

## 2014-10-25 VITALS — BP 130/79 | HR 74 | Temp 98.7°F | Ht 62.0 in | Wt 179.0 lb

## 2014-10-25 DIAGNOSIS — Z3202 Encounter for pregnancy test, result negative: Secondary | ICD-10-CM

## 2014-10-25 DIAGNOSIS — Z01812 Encounter for preprocedural laboratory examination: Secondary | ICD-10-CM

## 2014-10-25 DIAGNOSIS — R52 Pain, unspecified: Secondary | ICD-10-CM

## 2014-10-25 DIAGNOSIS — Z3049 Encounter for surveillance of other contraceptives: Secondary | ICD-10-CM

## 2014-10-25 DIAGNOSIS — Z3482 Encounter for supervision of other normal pregnancy, second trimester: Secondary | ICD-10-CM

## 2014-10-25 DIAGNOSIS — Z30017 Encounter for initial prescription of implantable subdermal contraceptive: Secondary | ICD-10-CM

## 2014-10-25 LAB — POCT URINE PREGNANCY: PREG TEST UR: NEGATIVE

## 2014-10-25 MED ORDER — OXYCODONE-ACETAMINOPHEN 10-325 MG PO TABS: 1.0000 | ORAL_TABLET | ORAL | Status: DC | PRN

## 2014-10-25 MED ORDER — SERTRALINE HCL 50 MG PO TABS: 50.0000 mg | ORAL_TABLET | Freq: Every day | ORAL | Status: DC

## 2014-10-25 NOTE — Progress Notes (Signed)
Nexplanon Procedure Note   PRE-OP DIAGNOSIS: desired long-term, reversible contraception  POST-OP DIAGNOSIS: Same  PROCEDURE: Nexplanon  placement Performing Provider: Baltazar Najjar MD  Patient education prior to procedure, explained risk, benefits of Nexplanon, reviewed alternative options. Patient reported understanding. Gave consent to continue with procedure.   PROCEDURE:  Pregnancy Text :  Negative Site (check):      left arm         Sterile Preparation:   Betadinex3 Lot # [id number] / [id number] Expiration Date 05 / 2018  Insertion site was selected 8 - 10 cm from medial epicondyle and marked along with guiding site using sterile marker. Procedure area was prepped and draped in a sterile fashion. 1% Lidocaine 1.5 ml given prior to procedure. Nexplanon  was inserted subcutaneously.Needle was removed from the insertion site. Nexplanon capsule was palpated by provider and patient to assure satisfactory placement. Dressing applied.  Followup: The patient tolerated the procedure well without complications.  Standard post-procedure care is explained and return precautions are given.  Baltazar Najjar MD

## 2014-11-07 ENCOUNTER — Encounter: Payer: Self-pay | Admitting: *Deleted

## 2014-11-09 ENCOUNTER — Ambulatory Visit: Payer: Medicaid Other | Admitting: Obstetrics

## 2014-11-29 ENCOUNTER — Other Ambulatory Visit: Payer: Self-pay | Admitting: Obstetrics

## 2014-12-12 NOTE — Telephone Encounter (Signed)
Patient is requesting pain medication - Lindsay Ramirez is still having pain at her incision site. 5:13 Patient states Lindsay Ramirez is having pain and doesn't know why. Offered appointment, but Lindsay Ramirez has started a new job. Will have Hassan Rowan keep her number and call her when we get a cancellation that is late.

## 2014-12-28 ENCOUNTER — Ambulatory Visit: Payer: Medicaid Other | Admitting: Obstetrics

## 2015-01-08 ENCOUNTER — Telehealth: Payer: Self-pay | Admitting: *Deleted

## 2015-01-08 NOTE — Telephone Encounter (Signed)
Patient called about some tests that needed to be done. 2:28 LM on VM to CB

## 2015-01-08 NOTE — Telephone Encounter (Signed)
Patient called in- she had an abnormal pap when she was pregnant- and wants to know what her follow up should be- repeat pap or colpo?

## 2015-01-10 NOTE — Telephone Encounter (Signed)
Repeat pap ~ 3 months postpartum.

## 2015-01-10 NOTE — Telephone Encounter (Signed)
Lindsay Ramirez- please call patient and let her know Dr Jodi Mourning would like to do a repeat pap. We can schedule her a pap anytime it is convenient for her.

## 2015-01-16 ENCOUNTER — Encounter: Payer: Self-pay | Admitting: Certified Nurse Midwife

## 2015-01-16 ENCOUNTER — Ambulatory Visit (INDEPENDENT_AMBULATORY_CARE_PROVIDER_SITE_OTHER): Payer: Medicaid Other | Admitting: Certified Nurse Midwife

## 2015-01-16 VITALS — BP 121/80 | HR 61 | Temp 97.3°F | Ht 62.0 in | Wt 200.0 lb

## 2015-01-16 DIAGNOSIS — A499 Bacterial infection, unspecified: Secondary | ICD-10-CM | POA: Diagnosis not present

## 2015-01-16 DIAGNOSIS — N760 Acute vaginitis: Secondary | ICD-10-CM

## 2015-01-16 DIAGNOSIS — Z113 Encounter for screening for infections with a predominantly sexual mode of transmission: Secondary | ICD-10-CM

## 2015-01-16 DIAGNOSIS — Z8742 Personal history of other diseases of the female genital tract: Secondary | ICD-10-CM

## 2015-01-16 DIAGNOSIS — B9689 Other specified bacterial agents as the cause of diseases classified elsewhere: Secondary | ICD-10-CM

## 2015-01-16 MED ORDER — TINIDAZOLE 500 MG PO TABS: 2.0000 g | ORAL_TABLET | Freq: Every day | ORAL | Status: AC

## 2015-01-16 NOTE — Progress Notes (Signed)
Patient ID: Lindsay Ramirez, female   DOB: 05/22/1986, 29 y.o.   MRN: [id number]   Chief Complaint  Patient presents with  . Repeat Pap    HPI [name] is a 29 y.o. female.  Here for repeat pap smear, had abnormal pap smear with ASCUS & HPV during pregnancy.  On Nexplanon implant for contraception, doing well, not having periods. Denies any problems with postpartum depression.   HPI  Past Medical History  Diagnosis Date  . Sickle cell trait   . Mental disorder   . Bipolar affective disorder   . Bipolar affective disorder 2006    TOOK MEDS X 3 YR  . Depression     PP 2013  . Abnormal Pap smear     LAST PAP 06/2011  . Infection     Hx -UTI  . Anemia     CHRONIC  . Vaginal Pap smear, abnormal   . SVD (spontaneous vaginal delivery)     x 4  . Missed ab 10/2013  . Asthma     rarely uses inhaler,  [facility]  . Gout     feet - no meds  . Headache(784.0)     otc med prn    Past Surgical History  Procedure Laterality Date  . Mouth surgery      wisdom teeth ext  . Wisdom tooth extraction  2012  . Vaginal delivery      x 4  . Dilation and evacuation N/A 11/04/2013    Procedure: DILATATION AND EVACUATION;  Surgeon: Alwyn Pea, MD;  Location: [facility];  Service: Gynecology;  Laterality: N/A;  . Dilation and curettage of uterus    . Cesarean section multi-gestational N/A 09/19/2014    Procedure: CESAREAN SECTION MULTI-GESTATIONAL;  Surgeon: Shelly Bombard, MD;  Location: [facility];  Service: Obstetrics;  Laterality: N/A;    Family History  Problem Relation Age of Onset  . Hypertension Mother   . Asthma Mother   . Heart disease Mother   . Hyperlipidemia Mother   . Thyroid disease Mother   . Alcohol abuse Father   . Asthma Sister   . Depression Sister   . Diabetes Maternal Aunt   . HIV Maternal Aunt   . Cancer Maternal Grandmother 37    COLON  . Depression Maternal Grandmother   . Alcohol abuse Maternal Grandmother   . Drug abuse Maternal Grandmother    . Alcohol abuse Maternal Grandfather   . Drug abuse Maternal Grandfather   . Stroke Maternal Grandfather   . Alcohol abuse Paternal Grandfather   . Drug abuse Paternal Grandfather   . Asthma Sister     Social History History  Substance Use Topics  . Smoking status: Never Smoker   . Smokeless tobacco: Never Used  . Alcohol Use: No    No Known Allergies  Current Outpatient Prescriptions  Medication Sig Dispense Refill  . albuterol (PROVENTIL HFA;VENTOLIN HFA) 108 (90 BASE) MCG/ACT inhaler Inhale 2 puffs into the lungs every 6 (six) hours as needed for wheezing or shortness of breath. (Patient not taking: Reported on 01/16/2015) 1 Inhaler 2  . sertraline (ZOLOFT) 50 MG tablet Take 1 tablet (50 mg total) by mouth daily. (Patient not taking: Reported on 01/16/2015) 30 tablet 6  . tinidazole (TINDAMAX) 500 MG tablet Take 4 tablets (2,000 mg total) by mouth daily with breakfast. 12 tablet 0   No current facility-administered medications for this visit.    Review of Systems Review of Systems  Constitutional: negative for fatigue and weight loss Respiratory: negative for cough and wheezing Cardiovascular: negative for chest pain, fatigue and palpitations Gastrointestinal: negative for abdominal pain and change in bowel habits Genitourinary:negative Integument/breast: negative for nipple discharge Musculoskeletal:negative for myalgias Neurological: negative for gait problems and tremors Behavioral/Psych: negative for abusive relationship, depression Endocrine: negative for temperature intolerance     Blood pressure 121/80, pulse 61, temperature 97.3 F (36.3 C), height 5\' 2"  (1.575 m), weight 90.719 kg (200 lb), not currently breastfeeding.  Physical Exam Physical Exam General:   alert  Skin:   no rash or abnormalities. Nexplanon palpated on left arm sulcus.   Lungs:   clear to auscultation bilaterally  Heart:   regular rate and rhythm, S1, S2 normal, no murmur, click, rub or  gallop  Breasts:   deferred  Abdomen:  normal findings: no organomegaly, soft, non-tender and no hernia  Pelvis:  External genitalia: normal general appearance Urinary system: urethral meatus normal and bladder without fullness, nontender Vaginal: normal without tenderness, induration or masses. +gray, thin discharge Cervix: normal appearance Adnexa: normal bimanual exam Uterus: anteverted and non-tender, normal size    50% of 15 min visit spent on counseling and coordination of care.   Data Reviewed Previous medical hx, labs, meds  Assessment     H/x of ASCUS + HPV on pap smear during pregnancy  Bacterial Vaginosis    Plan    Orders Placed This Encounter  Procedures  . SureSwab, Vaginosis/Vaginitis Plus   Meds ordered this encounter  Medications  . tinidazole (TINDAMAX) 500 MG tablet    Sig: Take 4 tablets (2,000 mg total) by mouth daily with breakfast.    Dispense:  12 tablet    Refill:  0    Follow up in one year with annual pap smear.

## 2015-01-18 LAB — PAP IG W/ RFLX HPV ASCU

## 2015-01-19 LAB — SURESWAB, VAGINOSIS/VAGINITIS PLUS
ATOPOBIUM VAGINAE: 7.7 Log (cells/mL)
C. ALBICANS, DNA: NOT DETECTED
C. PARAPSILOSIS, DNA: NOT DETECTED
C. TROPICALIS, DNA: NOT DETECTED
C. glabrata, DNA: NOT DETECTED
C. trachomatis RNA, TMA: NOT DETECTED
LACTOBACILLUS SPECIES: NOT DETECTED Log (cells/mL)
MEGASPHAERA SPECIES: NOT DETECTED Log (cells/mL)
N. gonorrhoeae RNA, TMA: NOT DETECTED
T. vaginalis RNA, QL TMA: NOT DETECTED

## 2015-01-23 ENCOUNTER — Other Ambulatory Visit: Payer: Self-pay | Admitting: Certified Nurse Midwife

## 2015-03-07 NOTE — Telephone Encounter (Signed)
[id number] - Patient scheduled and kept appt for repeat pap. brm

## 2015-04-13 ENCOUNTER — Encounter (HOSPITAL_COMMUNITY): Payer: Self-pay | Admitting: *Deleted

## 2015-04-13 ENCOUNTER — Emergency Department (HOSPITAL_COMMUNITY)
Admission: EM | Admit: 2015-04-13 | Discharge: 2015-04-13 | Disposition: A | Discharge status: Home / Self Care | Payer: Medicaid Other | Attending: Emergency Medicine | Admitting: Emergency Medicine

## 2015-04-13 DIAGNOSIS — Y998 Other external cause status: Secondary | ICD-10-CM | POA: Diagnosis not present

## 2015-04-13 DIAGNOSIS — F319 Bipolar disorder, unspecified: Secondary | ICD-10-CM | POA: Diagnosis not present

## 2015-04-13 DIAGNOSIS — Y9389 Activity, other specified: Secondary | ICD-10-CM | POA: Diagnosis not present

## 2015-04-13 DIAGNOSIS — Z8739 Personal history of other diseases of the musculoskeletal system and connective tissue: Secondary | ICD-10-CM | POA: Diagnosis not present

## 2015-04-13 DIAGNOSIS — Z87440 Personal history of urinary (tract) infections: Secondary | ICD-10-CM | POA: Diagnosis not present

## 2015-04-13 DIAGNOSIS — Z79899 Other long term (current) drug therapy: Secondary | ICD-10-CM | POA: Insufficient documentation

## 2015-04-13 DIAGNOSIS — S39012A Strain of muscle, fascia and tendon of lower back, initial encounter: Secondary | ICD-10-CM | POA: Diagnosis not present

## 2015-04-13 DIAGNOSIS — S3992XA Unspecified injury of lower back, initial encounter: Secondary | ICD-10-CM | POA: Diagnosis present

## 2015-04-13 DIAGNOSIS — E669 Obesity, unspecified: Secondary | ICD-10-CM | POA: Diagnosis not present

## 2015-04-13 DIAGNOSIS — J45909 Unspecified asthma, uncomplicated: Secondary | ICD-10-CM | POA: Diagnosis not present

## 2015-04-13 DIAGNOSIS — Y92410 Unspecified street and highway as the place of occurrence of the external cause: Secondary | ICD-10-CM | POA: Insufficient documentation

## 2015-04-13 DIAGNOSIS — Z862 Personal history of diseases of the blood and blood-forming organs and certain disorders involving the immune mechanism: Secondary | ICD-10-CM | POA: Insufficient documentation
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
## 2015-04-13 NOTE — ED Notes (Signed)
Pt with lower back pain.  Pt ambulatory.
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
## 2015-04-22 IMAGING — US US OB FOLLOW-UP
1 series · 15 of 28 positions shown · non-contrast
Comparison: none

[Series 1: us ob follow-up · 0.26mm/px · 15 of 50 slices shown]
[im 1/50]
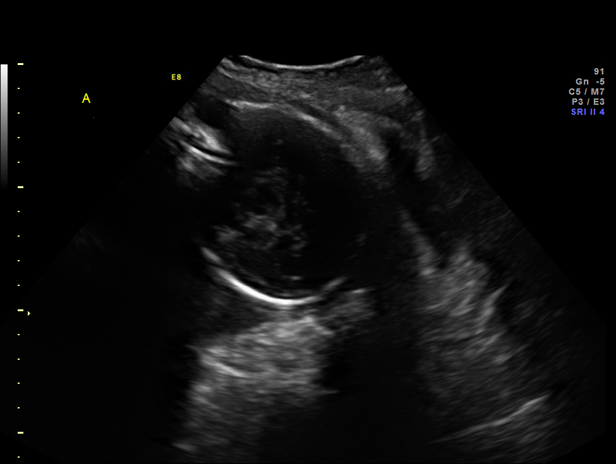
[im 4/50]
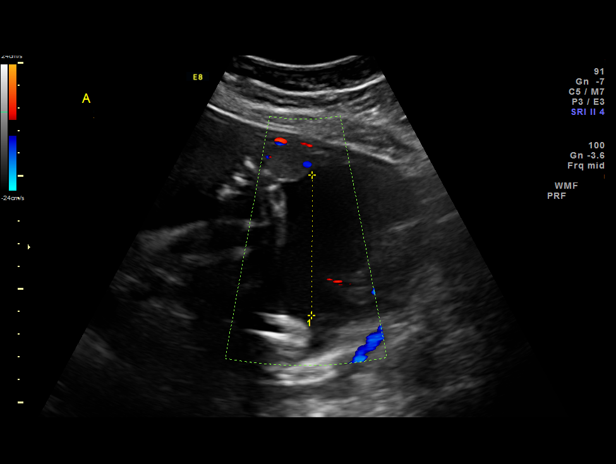
[im 8/50]
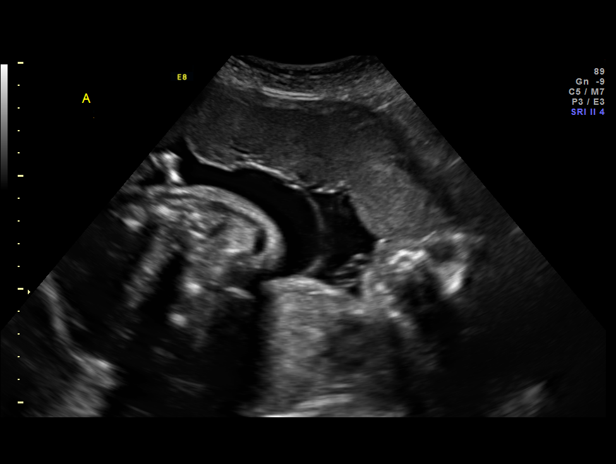
[im 11/50]
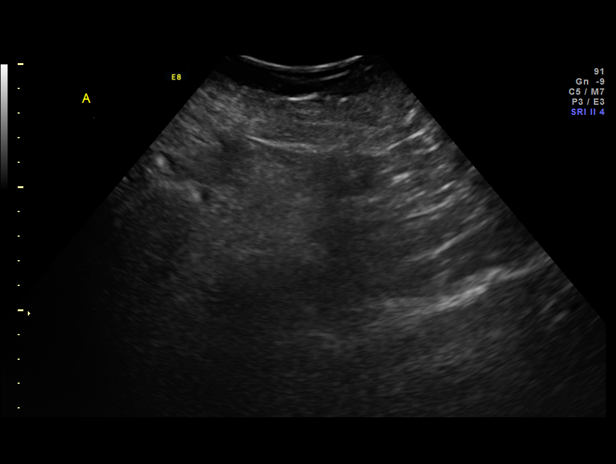
[im 15/50]
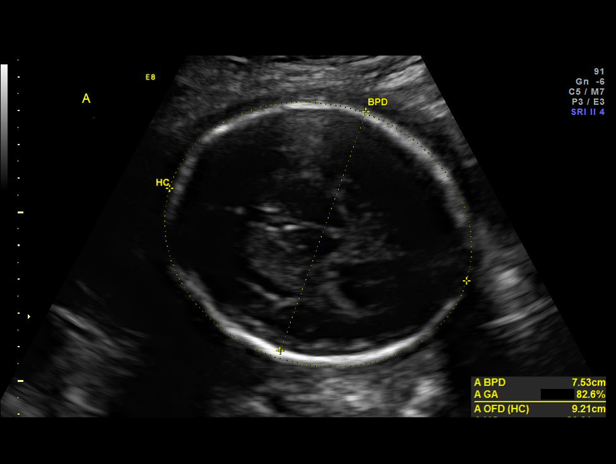
[im 19/50]
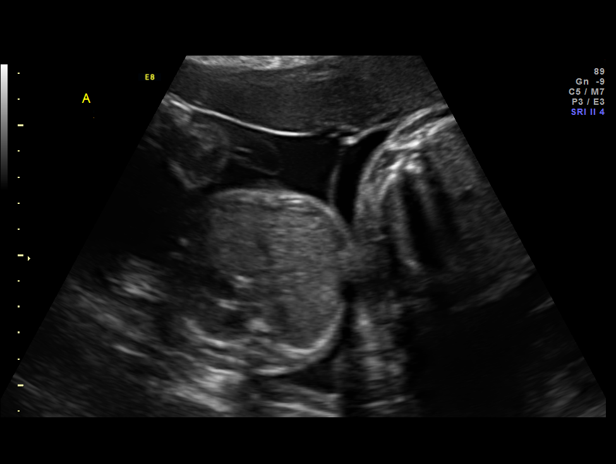
[im 22/50]
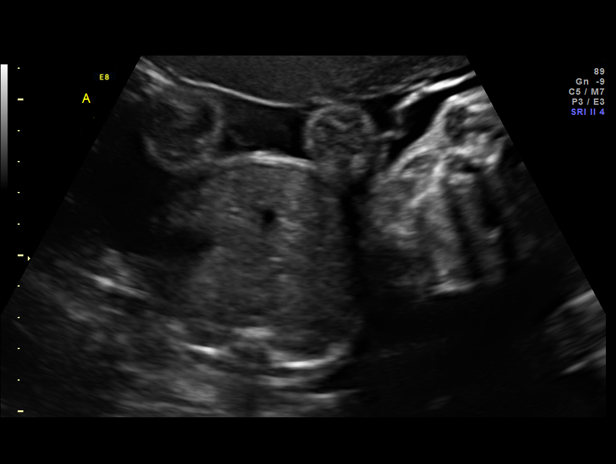
[im 26/50]
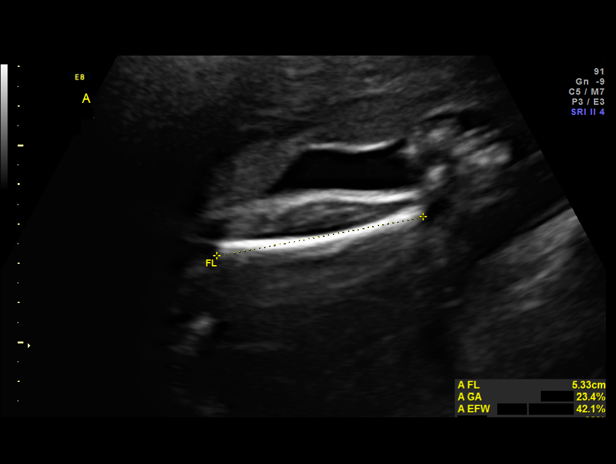
[im 28/50]
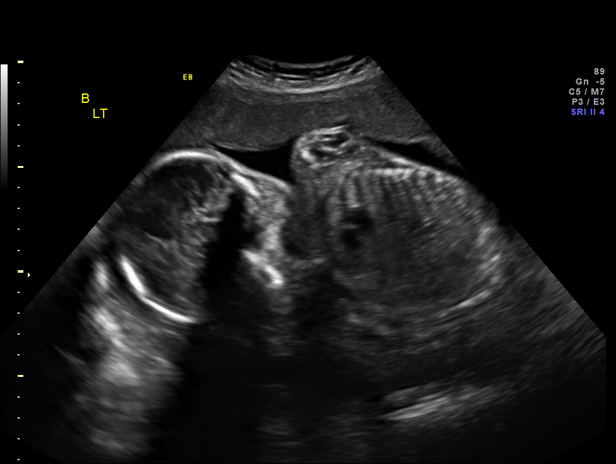
[im 31/50]
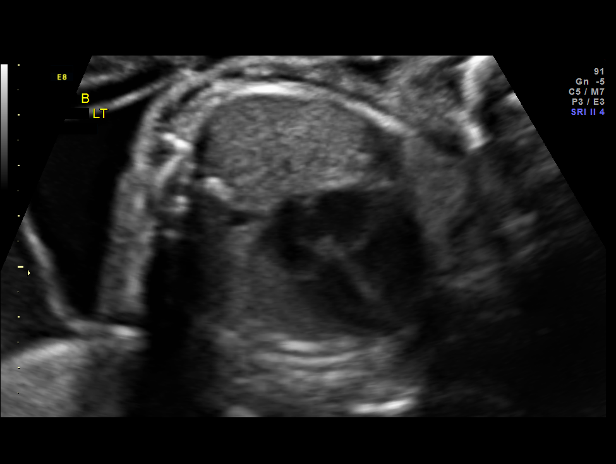
[im 35/50]
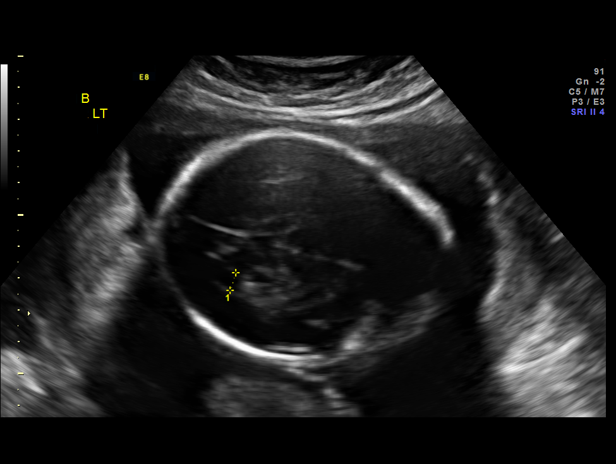
[im 39/50]
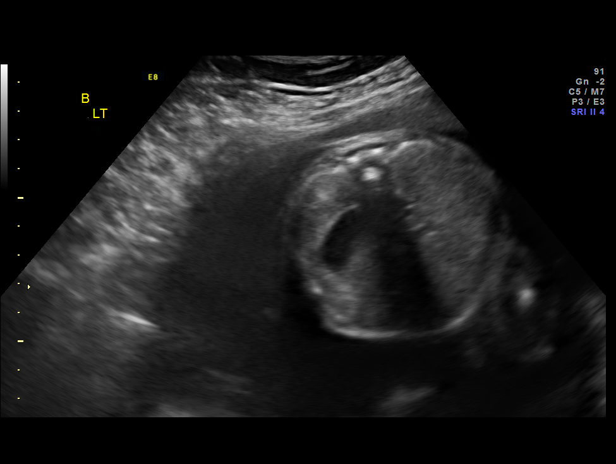
[im 42/50]
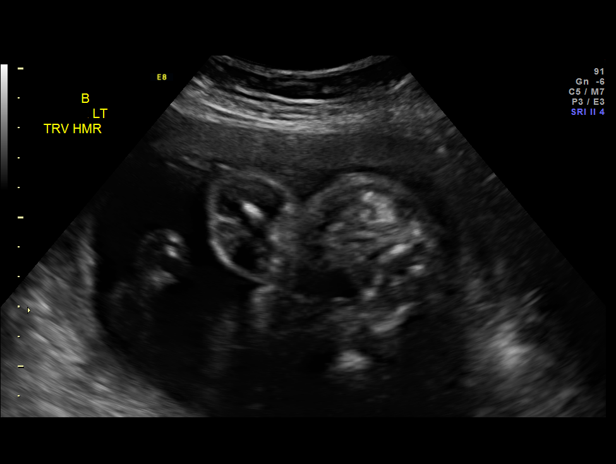
[im 46/50]
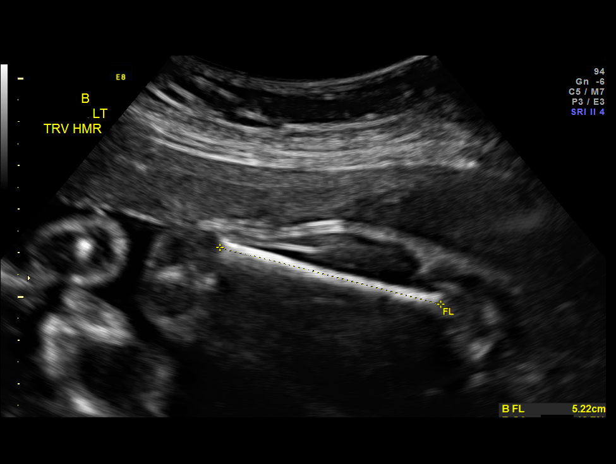
[im 50/50]
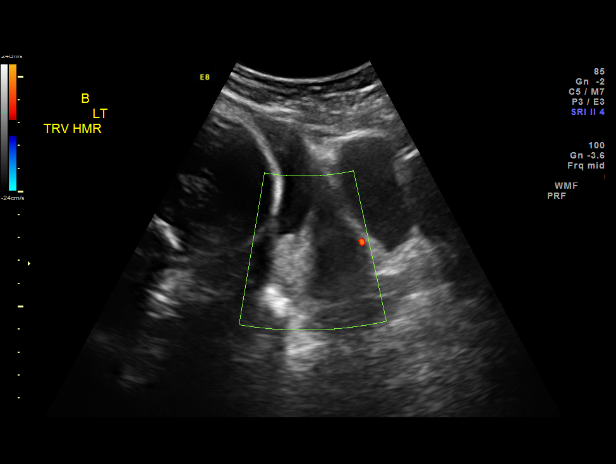

[15 of 28 positions shown; findings below may reference images not displayed]

OBSTETRICS REPORT
                      (Signed Final 07/28/2014 [DATE])

Service(s) Provided

 US OB FOLLOW UP                                       76816.1
 US OB FOLLOW UP ADDL GEST                             76816.2
Indications

 Twin pregnancy, di/di, second trimester
 28 weeks gestation of pregnancy
Fetal Evaluation (Fetus A)

 Num Of Fetuses:    2
 Fetal Heart Rate:  135                          bpm
 Cardiac Activity:  Observed
 Fetal Lie:         Lower Fetus
 Presentation:      Cephalic
 Placenta:          Anterior, above cervical os
 P. Cord            Previously Visualized
 Insertion:

 Membrane Desc:     Dividing
                    Membrane seen
                    - Dichorionic.

 Amniotic Fluid
 AFI FV:      Subjectively within normal limits
                                             Larg Pckt:     6.2  cm
Biometry (Fetus A)

 BPD:     75.1  mm     G. Age:  30w 1d                CI:         82.9   70 - 86
 OFD:     90.6  mm                                    FL/HC:      20.2   19.6 -

 HC:     264.3  mm     G. Age:  28w 5d       21  %    HC/AC:      1.12   0.99 -

 AC:     236.1  mm     G. Age:  27w 6d       22  %    FL/BPD:     71.0   71 - 87
 FL:      53.3  mm     G. Age:  28w 2d       24  %    FL/AC:      22.6   20 - 24
 HUM:       50  mm     G. Age:  29w 2d       57  %

 Est. FW:    7717  gm    2 lb 10 oz      42  %     FW Discordancy      0 \ 0 %
Gestational Age (Fetus A)

 U/S Today:     28w 5d                                        EDD:   10/15/14
 Best:          28w 5d     Det. By:  U/S Fetus A (05/02/14)   EDD:   10/15/14
Anatomy (Fetus A)

 Cranium:          Appears normal         Aortic Arch:      Previously seen
 Fetal Cavum:      Appears normal         Ductal Arch:      Previously seen
 Ventricles:       Appears normal         Diaphragm:        Previously seen
 Choroid Plexus:   Previously seen        Stomach:          Appears normal, left
                                                            sided
 Cerebellum:       Previously seen        Abdomen:          Appears normal
 Posterior Fossa:  Previously seen        Abdominal Wall:   Previously seen
 Nuchal Fold:      Not applicable (>20    Cord Vessels:     Previously seen
                   wks GA)
 Face:             Orbits and profile     Kidneys:          Appear normal
                   previously seen
 Lips:             Previously seen        Bladder:          Appears normal
 Heart:            Appears normal         Spine:            Previously seen
                   (4CH, axis, and
                   situs)
 RVOT:             Previously seen        Lower             Previously seen
                                          Extremities:
 LVOT:             Previously seen        Upper             Previously seen
                                          Extremities:

 Other:  Female gender previously seen. Nasal bone previously visualized.
         Technically difficult due to advanced GA and fetal position.

Fetal Evaluation (Fetus B)

 Num Of Fetuses:    2
 Fetal Heart Rate:  155                          bpm
 Cardiac Activity:  Observed
 Fetal Lie:         Upper Fetus
 Presentation:      Variable
 Placenta:          Anterior, above cervical os
 P. Cord            Marginal insertion prev
 Insertion:         seen

 Amniotic Fluid
 AFI FV:      Subjectively within normal limits
                                             Larg Pckt:     8.0  cm
Biometry (Fetus B)

 BPD:     70.1  mm     G. Age:  28w 1d                CI:         75.9   70 - 86
 OFD:     92.3  mm                                    FL/HC:      20.0   19.6 -

 HC:     261.7  mm     G. Age:  28w 3d       14  %    HC/AC:      1.08   0.99 -

 AC:     241.5  mm     G. Age:  28w 3d       35  %    FL/BPD:     74.8   71 - 87
 FL:      52.4  mm     G. Age:  27w 6d       16  %    FL/AC:      21.7   20 - 24
 HUM:     48.5  mm     G. Age:  28w 4d       40  %

 Est. FW:    8858  gm    2 lb 10 oz      41  %     FW Discordancy         0  %
Gestational Age (Fetus B)

 U/S Today:     28w 2d                                        EDD:   10/18/14
 Best:          28w 5d     Det. By:  U/S Fetus A (05/02/14)   EDD:   10/15/14
Anatomy (Fetus B)
 Cranium:          Appears normal         Aortic Arch:      Previously seen
 Fetal Cavum:      Appears normal         Ductal Arch:      Previously seen
 Ventricles:       Appears normal         Diaphragm:        Previously seen
 Choroid Plexus:   Previously seen        Stomach:          Appears normal, left
                                                            sided
 Cerebellum:       Previously seen        Abdomen:          Appears normal
 Posterior Fossa:  Previously seen        Abdominal Wall:   Previously seen
 Nuchal Fold:      Not applicable (>20    Cord Vessels:     Previously seen
                   wks GA)
 Face:             Orbits and profile     Kidneys:          Appear normal
                   previously seen
 Lips:             Previously seen        Bladder:          Appears normal
 Heart:            Appears normal         Spine:            Previously seen
                   (4CH, axis, and
                   situs)
 RVOT:             Previously seen        Lower             Previously seen
                                          Extremities:
 LVOT:             Previously seen        Upper             Previously seen
                                          Extremities:

 Other:  Male gender previously seen. Nasal bone previously visualized.
         Technically difficult due to advanced GA and fetal position.
Cervix Uterus Adnexa

 Cervical Length:    3.2      cm

 Cervix:       Normal appearance by transabdominal scan.

 Adnexa:     No abnormality visualized.
Impression

 Dichorionic/diamniotic twin pregnancy at 28+5 weeks
 Normal interval anatomy; anatomic survey complete x 2
 Normal amniotic fluid volume x 2
 Appropriate interval growths with EFWs at the 42nd and the
 41st %tiles
Recommendations

 Follow-up ultrasound for growth in 4 weeks

## 2016-05-17 ENCOUNTER — Inpatient Hospital Stay (HOSPITAL_COMMUNITY): Payer: Medicaid Other

## 2016-05-17 ENCOUNTER — Encounter (HOSPITAL_COMMUNITY): Payer: Self-pay

## 2016-05-17 ENCOUNTER — Emergency Department (HOSPITAL_COMMUNITY)
Admission: EM | Admit: 2016-05-17 | Discharge: 2016-05-17 | Disposition: A | Discharge status: Home / Self Care | Payer: Medicaid Other | Attending: Dermatology | Admitting: Dermatology

## 2016-05-17 ENCOUNTER — Encounter (HOSPITAL_COMMUNITY): Payer: Self-pay | Admitting: Emergency Medicine

## 2016-05-17 ENCOUNTER — Inpatient Hospital Stay (HOSPITAL_COMMUNITY)
Admission: AD | Admit: 2016-05-17 | Discharge: 2016-05-17 | Disposition: A | Discharge status: Home / Self Care | Payer: Medicaid Other | Source: Ambulatory Visit | Attending: Obstetrics and Gynecology | Admitting: Obstetrics and Gynecology

## 2016-05-17 DIAGNOSIS — M109 Gout, unspecified: Secondary | ICD-10-CM | POA: Diagnosis not present

## 2016-05-17 DIAGNOSIS — Z3202 Encounter for pregnancy test, result negative: Secondary | ICD-10-CM | POA: Diagnosis not present

## 2016-05-17 DIAGNOSIS — K458 Other specified abdominal hernia without obstruction or gangrene: Secondary | ICD-10-CM | POA: Diagnosis not present

## 2016-05-17 DIAGNOSIS — Z5321 Procedure and treatment not carried out due to patient leaving prior to being seen by health care provider: Secondary | ICD-10-CM | POA: Insufficient documentation

## 2016-05-17 DIAGNOSIS — D649 Anemia, unspecified: Secondary | ICD-10-CM | POA: Diagnosis not present

## 2016-05-17 DIAGNOSIS — R1010 Upper abdominal pain, unspecified: Secondary | ICD-10-CM | POA: Diagnosis present

## 2016-05-17 DIAGNOSIS — Z87440 Personal history of urinary (tract) infections: Secondary | ICD-10-CM | POA: Diagnosis not present

## 2016-05-17 DIAGNOSIS — F319 Bipolar disorder, unspecified: Secondary | ICD-10-CM | POA: Diagnosis not present

## 2016-05-17 DIAGNOSIS — D573 Sickle-cell trait: Secondary | ICD-10-CM | POA: Insufficient documentation

## 2016-05-17 DIAGNOSIS — R109 Unspecified abdominal pain: Secondary | ICD-10-CM | POA: Diagnosis present

## 2016-05-17 DIAGNOSIS — R112 Nausea with vomiting, unspecified: Secondary | ICD-10-CM | POA: Diagnosis not present

## 2016-05-17 DIAGNOSIS — J45909 Unspecified asthma, uncomplicated: Secondary | ICD-10-CM | POA: Diagnosis not present

## 2016-05-17 DIAGNOSIS — R1011 Right upper quadrant pain: Secondary | ICD-10-CM

## 2016-05-17 LAB — COMPREHENSIVE METABOLIC PANEL
ALBUMIN: 4.4 g/dL (ref 3.5–5.0)
ALK PHOS: 56 U/L (ref 38–126)
ALT: 12 U/L — ABNORMAL LOW (ref 14–54)
ANION GAP: 8 (ref 5–15)
AST: 14 U/L — ABNORMAL LOW (ref 15–41)
BUN: 9 mg/dL (ref 6–20)
CALCIUM: 9.4 mg/dL (ref 8.9–10.3)
CO2: 27 mmol/L (ref 22–32)
Chloride: 104 mmol/L (ref 101–111)
Creatinine, Ser: 0.79 mg/dL (ref 0.44–1.00)
GFR calc non Af Amer: >60 mL/min (ref >60)
Glucose, Bld: 94 mg/dL (ref 65–99)
POTASSIUM: 3.9 mmol/L (ref 3.5–5.1)
Sodium: 139 mmol/L (ref 135–145)
TOTAL PROTEIN: 7.6 g/dL (ref 6.5–8.1)
Total Bilirubin: 0.4 mg/dL (ref 0.3–1.2)

## 2016-05-17 LAB — LIPASE, BLOOD: LIPASE: 13 U/L (ref 11–51)

## 2016-05-17 LAB — URINALYSIS, ROUTINE W REFLEX MICROSCOPIC
BILIRUBIN URINE: NEGATIVE
GLUCOSE, UA: NEGATIVE mg/dL
Ketones, ur: NEGATIVE mg/dL
Leukocytes, UA: NEGATIVE
Nitrite: NEGATIVE
Protein, ur: NEGATIVE mg/dL
Specific Gravity, Urine: 1.02 (ref 1.005–1.030)
pH: 6 (ref 5.0–8.0)

## 2016-05-17 LAB — CBC
HCT: 34.8 % — ABNORMAL LOW (ref 36.0–46.0)
HEMOGLOBIN: 11.7 g/dL — AB (ref 12.0–15.0)
MCH: 26.9 pg (ref 26.0–34.0)
MCHC: 33.6 g/dL (ref 30.0–36.0)
MCV: 80 fL (ref 78.0–100.0)
Platelets: 265 10*3/uL (ref 150–400)
RBC: 4.35 MIL/uL (ref 3.87–5.11)
RDW: 13.3 % (ref 11.5–15.5)
WBC: 8.7 10*3/uL (ref 4.0–10.5)

## 2016-05-17 LAB — POCT PREGNANCY, URINE: PREG TEST UR: NEGATIVE

## 2016-05-17 LAB — AMYLASE: Amylase: 62 U/L (ref 28–100)

## 2016-05-17 LAB — URINE MICROSCOPIC-ADD ON

## 2016-05-17 MED ORDER — DICYCLOMINE HCL 10 MG PO CAPS
20.0000 mg | ORAL_CAPSULE | Freq: Once | ORAL | Status: AC
  Administered 2016-05-17: 20 mg via ORAL
  Filled 2016-05-17: qty 2

## 2016-05-17 MED ORDER — OXYCODONE-ACETAMINOPHEN 5-325 MG PO TABS
2.0000 | ORAL_TABLET | Freq: Once | ORAL | Status: AC
  Administered 2016-05-17: 2 via ORAL
  Filled 2016-05-17: qty 2

## 2016-05-17 MED ORDER — GI COCKTAIL ~~LOC~~
30.0000 mL | Freq: Once | ORAL | Status: AC
  Administered 2016-05-17: 30 mL via ORAL
  Filled 2016-05-17: qty 30

## 2016-05-17 MED ORDER — DICYCLOMINE HCL 20 MG PO TABS
20.0000 mg | ORAL_TABLET | Freq: Once | ORAL | Status: DC
  Filled 2016-05-17: qty 1

## 2016-05-17 NOTE — MAU Provider Note (Signed)
Chief Complaint: Abdominal Pain and Emesis   None     SUBJECTIVE HPI: Lindsay Ramirez is a 30 y.o. [id number] who presents to maternity admissions reporting onset of upper abdominal pain yesterday becoming progressively worse today. The pain is associated with nausea with vomiting x 3 in last 24 hours. She reports she has not eating since last night because eating makes the pain worse and causes her to vomit.  She has taken tylenol at home which has not helped the pain.  The pain is constant, sharp, radiates from her epigastric area down to her umbilicus, and is worsening since onset.  It is increased by movement/position change.  She also reports vaginal bleeding x 2 weeks but denies any lower abdominal cramping or pain. She has Nexplanon for contraception and reports irregular bleeding with the device but it does not usually last this long. She denies vaginal itching/burning, urinary symptoms, h/a, dizziness, n/v, or fever/chills.     HPI  Past Medical History:  Diagnosis Date  . Abnormal Pap smear    LAST PAP 06/2011  . Anemia    CHRONIC  . Asthma    rarely uses inhaler,  [facility]  . Bipolar affective disorder ([place])   . Bipolar affective disorder ([place]) 2006   TOOK MEDS X 3 YR  . Depression    PP 2013  . Gout    feet - no meds  . Headache(784.0)    otc med prn  . Infection    Hx -UTI  . Mental disorder   . Missed ab 10/2013  . Sickle cell trait ([place])   . SVD (spontaneous vaginal delivery)    x 4  . Vaginal Pap smear, abnormal    Past Surgical History:  Procedure Laterality Date  . CESAREAN SECTION MULTI-GESTATIONAL N/A 09/19/2014   Procedure: CESAREAN SECTION MULTI-GESTATIONAL;  Surgeon: Shelly Bombard, MD;  Location: [facility];  Service: Obstetrics;  Laterality: N/A;  . DILATION AND CURETTAGE OF UTERUS    . DILATION AND EVACUATION N/A 11/04/2013   Procedure: DILATATION AND EVACUATION;  Surgeon: Alwyn Pea, MD;  Location: [facility];  Service: Gynecology;  Laterality:  N/A;  . MOUTH SURGERY     wisdom teeth ext  . VAGINAL DELIVERY     x 4  . WISDOM TOOTH EXTRACTION  2012   Social History   Social History  . Marital status: Married    Spouse name: N/A  . Number of children: 3  . Years of education: 59   Occupational History  . STUDENT [employer]  . 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 Smoking status: Never Smoker  . Smokeless tobacco: Never Used  . Alcohol use No  . Drug use: No  . Sexual activity: Yes    Partners: Male    Birth control/ protection: Implant   Other Topics Concern  . Not on file   Social History Narrative  . No narrative on file   No current facility-administered medications on file prior to encounter.    Current Outpatient Prescriptions on File Prior to Encounter  Medication Sig Dispense Refill  . albuterol (PROVENTIL HFA;VENTOLIN HFA) 108 (90 BASE) MCG/ACT inhaler Inhale 2 puffs into the lungs every 6 (six) hours as needed for wheezing or shortness of breath. 1 Inhaler 2   No Known Allergies  ROS:  Review of Systems  Constitutional: Positive for appetite change. Negative for chills, fatigue and fever.  Respiratory: Negative for shortness of breath.  Cardiovascular: Negative for chest pain.  Gastrointestinal: Positive for abdominal pain, nausea and vomiting.  Genitourinary: Positive for vaginal bleeding. Negative for difficulty urinating, dysuria, flank pain, pelvic pain, vaginal discharge and vaginal pain.  Neurological: Negative for dizziness and headaches.  Psychiatric/Behavioral: Negative.      I have reviewed patient's Past Medical Hx, Surgical Hx, Family Hx, Social Hx, medications and allergies.   Physical Exam   Patient Vitals for the past 24 hrs:  BP Temp Temp src Pulse Resp Height Weight  05/17/16 1632 127/79 - - 60 - 5\' 2"  (1.575 m) 195 lb (88.5 kg)  05/17/16 1631 - 98.5 F (36.9 C) Oral - 18 - -   Constitutional: Well-developed, well-nourished female in moderate distress.   HEART: normal rate, heart sounds, regular rhythm RESP: normal effort, lung sounds clear and equal bilaterally GI: Abd soft, tenderness in epigastric area.  Abdominal hernia, above umbilicus noted but soft and nontender in area of hernia. Pos BS x 4 MS: Extremities nontender, no edema, normal ROM Neurologic: Alert and oriented x 4.  GU: Neg CVAT.  PELVIC EXAM: Deferred  LAB RESULTS Results for orders placed or performed during the hospital encounter of 05/17/16 (from the past 24 hour(s))  Urinalysis, Routine w reflex microscopic (not at [facility])     Status: Abnormal   Collection Time: 05/17/16  4:30 PM  Result Value Ref Range   Color, Urine YELLOW YELLOW   APPearance CLEAR CLEAR   Specific Gravity, Urine 1.020 1.005 - 1.030   pH 6.0 5.0 - 8.0   Glucose, UA NEGATIVE NEGATIVE mg/dL   Hgb urine dipstick LARGE (A) NEGATIVE   Bilirubin Urine NEGATIVE NEGATIVE   Ketones, ur NEGATIVE NEGATIVE mg/dL   Protein, ur NEGATIVE NEGATIVE mg/dL   Nitrite NEGATIVE NEGATIVE   Leukocytes, UA NEGATIVE NEGATIVE  Urine microscopic-add on     Status: Abnormal   Collection Time: 05/17/16  4:30 PM  Result Value Ref Range   Squamous Epithelial / LPF 0-5 (A) NONE SEEN   WBC, UA 0-5 0 - 5 WBC/hpf   RBC / HPF 0-5 0 - 5 RBC/hpf   Bacteria, UA RARE (A) NONE SEEN  Pregnancy, urine POC     Status: None   Collection Time: 05/17/16  4:38 PM  Result Value Ref Range   Preg Test, Ur NEGATIVE NEGATIVE  CBC     Status: Abnormal   Collection Time: 05/17/16  5:06 PM  Result Value Ref Range   WBC 8.7 4.0 - 10.5 K/uL   RBC 4.35 3.87 - 5.11 MIL/uL   Hemoglobin 11.7 (L) 12.0 - 15.0 g/dL   HCT 34.8 (L) 36.0 - 46.0 %   MCV 80.0 78.0 - 100.0 fL   MCH 26.9 26.0 - 34.0 pg   MCHC 33.6 30.0 - 36.0 g/dL   RDW 13.3 11.5 - 15.5 %   Platelets 265 150 - 400 K/uL  Comprehensive metabolic panel     Status: Abnormal   Collection Time: 05/17/16  5:06 PM  Result Value Ref Range   Sodium 139 135 - 145 mmol/L   Potassium 3.9 3.5 -  5.1 mmol/L   Chloride 104 101 - 111 mmol/L   CO2 27 22 - 32 mmol/L   Glucose, Bld 94 65 - 99 mg/dL   BUN 9 6 - 20 mg/dL   Creatinine, Ser 0.79 0.44 - 1.00 mg/dL   Calcium 9.4 8.9 - 10.3 mg/dL   Total Protein 7.6 6.5 - 8.1 g/dL   Albumin 4.4 3.5 -  5.0 g/dL   AST 14 (L) 15 - 41 U/L   ALT 12 (L) 14 - 54 U/L   Alkaline Phosphatase 56 38 - 126 U/L   Total Bilirubin 0.4 0.3 - 1.2 mg/dL   GFR calc non Af Amer >60 >60 mL/min   GFR calc Af Amer >60 >60 mL/min   Anion gap 8 5 - 15  Amylase     Status: None   Collection Time: 05/17/16  5:06 PM  Result Value Ref Range   Amylase 62 28 - 100 U/L  Lipase, blood     Status: None   Collection Time: 05/17/16  5:06 PM  Result Value Ref Range   Lipase 13 11 - 51 U/L       IMAGING US Abdomen Limited Ruq  Result Date: 05/17/2016 CLINICAL DATA:  Upper abdominal pain, nausea vomiting. EXAM: US ABDOMEN LIMITED - RIGHT UPPER QUADRANT COMPARISON:  None. FINDINGS: Gallbladder: No gallstones or wall thickening visualized. No sonographic Murphy sign noted by sonographer. Common bile duct: Diameter: 4.3 mm Liver: No focal lesion identified. Within normal limits in parenchymal echogenicity. IMPRESSION: Normal sonographic evaluation of the right upper quadrant of the abdomen. Electronically Signed   By: Fidela Salisbury M.D.   On: 05/17/2016 17:37    MAU Management/MDM: Ordered labs and [place] and reviewed results.  GI Cocktail given without results.  Percocet 5/325 x 2 tabs given with some improvement in pain and Bentyl 20 mg tablet given.  Pt tearful and crying with pain in MAU.  Abdominal hernia present but no evidence of incarceration.  Consult Dr Ilda Basset.  Pt stabilized from OB standpoint.  Young age makes vascular abnormality unlikely but not ruled out.  Transfer to [facility] for [facility] evaluation.  Pt stable at time of transfer.     Fatima Blank Certified Nurse-Midwife 05/17/2016  7:11 PM

## 2016-05-17 NOTE — MAU Note (Signed)
Patient got no pain relief from GI Cocktail, notified Fatima Blank CNM

## 2016-05-17 NOTE — ED Notes (Signed)
Pt not wanting to wait any longer. Pt encouraged to stay, pt needs to get children home.

## 2016-05-17 NOTE — MAU Note (Signed)
Onset of upper abdominal pain since yesterday, having nausea, vomited x 2, LMP 05/03/16 x 2 weeks.

## 2016-05-17 NOTE — ED Triage Notes (Addendum)
Patient seen at [facility] for abdominal pain, with nausea and vomiting.  Patient states that the pain is mid abdominal.  No diarrhea.  She received an ultrasound down there which was limited, they recommended to come to ED.  Patient given Percocet for pain before [facility].

## 2016-05-18 ENCOUNTER — Emergency Department (HOSPITAL_COMMUNITY)
Admission: EM | Admit: 2016-05-18 | Discharge: 2016-05-18 | Disposition: A | Discharge status: Home / Self Care | Payer: Medicaid Other | Attending: Emergency Medicine | Admitting: Emergency Medicine

## 2016-05-18 ENCOUNTER — Encounter (HOSPITAL_COMMUNITY): Payer: Self-pay

## 2016-05-18 DIAGNOSIS — Z79899 Other long term (current) drug therapy: Secondary | ICD-10-CM | POA: Diagnosis not present

## 2016-05-18 DIAGNOSIS — R110 Nausea: Secondary | ICD-10-CM

## 2016-05-18 DIAGNOSIS — J45909 Unspecified asthma, uncomplicated: Secondary | ICD-10-CM | POA: Diagnosis not present

## 2016-05-18 DIAGNOSIS — R112 Nausea with vomiting, unspecified: Secondary | ICD-10-CM | POA: Diagnosis not present

## 2016-05-18 DIAGNOSIS — R1013 Epigastric pain: Secondary | ICD-10-CM

## 2016-05-18 LAB — CBC WITH DIFFERENTIAL/PLATELET
BASOS ABS: 0 10*3/uL (ref 0.0–0.1)
BASOS PCT: 0 %
Eosinophils Absolute: 0.2 10*3/uL (ref 0.0–0.7)
Eosinophils Relative: 2 %
HCT: 37.5 % (ref 36.0–46.0)
Hemoglobin: 11.9 g/dL — ABNORMAL LOW (ref 12.0–15.0)
Lymphocytes Relative: 27 %
Lymphs Abs: 2.4 10*3/uL (ref 0.7–4.0)
MCH: 26.4 pg (ref 26.0–34.0)
MCHC: 31.7 g/dL (ref 30.0–36.0)
MCV: 83.3 fL (ref 78.0–100.0)
MONO ABS: 0.7 10*3/uL (ref 0.1–1.0)
Monocytes Relative: 8 %
NEUTROS PCT: 63 %
Neutro Abs: 5.7 10*3/uL (ref 1.7–7.7)
Platelets: 265 10*3/uL (ref 150–400)
RBC: 4.5 MIL/uL (ref 3.87–5.11)
RDW: 13 % (ref 11.5–15.5)
WBC: 9 10*3/uL (ref 4.0–10.5)

## 2016-05-18 LAB — COMPREHENSIVE METABOLIC PANEL
ALT: 11 U/L — ABNORMAL LOW (ref 14–54)
ANION GAP: 6 (ref 5–15)
AST: 13 U/L — AB (ref 15–41)
Albumin: 3.8 g/dL (ref 3.5–5.0)
Alkaline Phosphatase: 60 U/L (ref 38–126)
BILIRUBIN TOTAL: 0.8 mg/dL (ref 0.3–1.2)
BUN: 6 mg/dL (ref 6–20)
CHLORIDE: 104 mmol/L (ref 101–111)
CO2: 27 mmol/L (ref 22–32)
Calcium: 9.4 mg/dL (ref 8.9–10.3)
Creatinine, Ser: 0.77 mg/dL (ref 0.44–1.00)
Glucose, Bld: 89 mg/dL (ref 65–99)
POTASSIUM: 4 mmol/L (ref 3.5–5.1)
Sodium: 137 mmol/L (ref 135–145)
TOTAL PROTEIN: 7.3 g/dL (ref 6.5–8.1)

## 2016-05-18 LAB — URINALYSIS, ROUTINE W REFLEX MICROSCOPIC
Bilirubin Urine: NEGATIVE
GLUCOSE, UA: NEGATIVE mg/dL
KETONES UR: 15 mg/dL — AB
Leukocytes, UA: NEGATIVE
NITRITE: NEGATIVE
PH: 7.5 (ref 5.0–8.0)
PROTEIN: NEGATIVE mg/dL
Specific Gravity, Urine: 1.016 (ref 1.005–1.030)

## 2016-05-18 LAB — URINE MICROSCOPIC-ADD ON
Bacteria, UA: NONE SEEN
RBC / HPF: NONE SEEN RBC/hpf (ref 0–5)

## 2016-05-18 LAB — POC URINE PREG, ED: Preg Test, Ur: NEGATIVE

## 2016-05-18 LAB — LIPASE, BLOOD: LIPASE: 18 U/L (ref 11–51)

## 2016-05-18 MED ORDER — ONDANSETRON 4 MG PO TBDP
4.0000 mg | ORAL_TABLET | Freq: Once | ORAL | Status: AC
  Administered 2016-05-18: 4 mg via ORAL
  Filled 2016-05-18: qty 1

## 2016-05-18 MED ORDER — OMEPRAZOLE 20 MG PO CPDR: 20.0000 mg | DELAYED_RELEASE_CAPSULE | Freq: Every day | ORAL | 0 refills | Status: DC

## 2016-05-18 MED ORDER — ONDANSETRON 4 MG PO TBDP: 4.0000 mg | ORAL_TABLET | Freq: Three times a day (TID) | ORAL | 0 refills | Status: DC | PRN

## 2016-05-18 MED ORDER — DICYCLOMINE HCL 20 MG PO TABS: 20.0000 mg | ORAL_TABLET | Freq: Two times a day (BID) | ORAL | 0 refills | Status: DC

## 2016-05-18 MED ORDER — GI COCKTAIL ~~LOC~~
30.0000 mL | Freq: Once | ORAL | Status: AC
  Administered 2016-05-18: 30 mL via ORAL
  Filled 2016-05-18: qty 30

## 2016-05-18 MED ORDER — DICYCLOMINE HCL 10 MG PO CAPS
20.0000 mg | ORAL_CAPSULE | Freq: Once | ORAL | Status: AC
  Administered 2016-05-18: 20 mg via ORAL
  Filled 2016-05-18: qty 2

## 2016-05-18 NOTE — ED Triage Notes (Signed)
patient here with ongoing upper abdominal pain with nausea.[facility] and had labs, u/s and urinalysis. Requesting further evaluation. Reports 2 episodes of emesis since Friday, NAD

## 2016-05-18 NOTE — ED Provider Notes (Signed)
[place] DEPT Provider Note   CSN: [id number] Arrival date & time: 05/18/16  [id number]     History   Chief Complaint Chief Complaint  Patient presents with  . Abdominal Pain    HPI Lindsay Ramirez is a 30 y.o. female.  Lindsay Ramirez is a 30 y.o. Female who presents to the emergency department complaining of epigastric abdominal pain for the past 3 days. Patient reports her pain is constant and she is unable to identify alleviating or aggravating factors. She denies pain worsening with eating. She reports nausea and vomiting. She reports she vomited once 2 days ago and once yesterday. No vomiting today. She does report nausea currently. No diarrhea. No lower abdominal pain. No urinary symptoms. She has taken nothing for treatment of her symptoms today. She was seen at the [facility] yesterday and had blood work and a right upper quadrant ultrasound that was normal. Patient would like to be checked here. Patient is currently on her menstrual cycle. Patient denies fevers, lower abdominal pain, urinary symptoms, vaginal bleeding, vaginal discharge, cough, chest pain, shortness of breath, rashes. Previous abdominal surgical history includes a cesarean section.   The history is provided by the patient and medical records. No language interpreter was used.  Abdominal Pain   Associated symptoms include nausea and vomiting. Pertinent negatives include fever, diarrhea, dysuria, frequency, hematuria and headaches.    Past Medical History:  Diagnosis Date  . Abnormal Pap smear    LAST PAP 06/2011  . Anemia    CHRONIC  . Asthma    rarely uses inhaler,  [facility]  . Bipolar affective disorder ([place])   . Bipolar affective disorder ([place]) 2006   TOOK MEDS X 3 YR  . Depression    PP 2013  . Gout    feet - no meds  . Headache(784.0)    otc med prn  . Infection    Hx -UTI  . Mental disorder   . Missed ab 10/2013  . Sickle cell trait ([place])   . SVD (spontaneous vaginal  delivery)    x 4  . Vaginal Pap smear, abnormal     Patient Active Problem List   Diagnosis Date Noted  . S/P cesarean section 09/19/2014  . Poor fetal growth affecting management of mother in third trimester, antepartum   . Dichorionic diamniotic twin pregnancy in third trimester   . 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 Twin gestation, dichorionic diamniotic 05/04/2014  . Migraine without aura 03/25/2014  . 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 Gout 06/15/2013  . Bipolar disorder, unspecified/depression 11/11/2012  . 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 Acid reflux 06/21/2012    Past Surgical History:  Procedure Laterality Date  . CESAREAN SECTION MULTI-GESTATIONAL N/A 09/19/2014   Procedure: CESAREAN SECTION MULTI-GESTATIONAL;  Surgeon: Shelly Bombard, MD;  Location: [facility];  Service: Obstetrics;  Laterality: N/A;  . DILATION AND CURETTAGE OF UTERUS    . DILATION AND EVACUATION N/A 11/04/2013   Procedure: DILATATION AND EVACUATION;  Surgeon: Alwyn Pea, MD;  Location: [facility];  Service: Gynecology;  Laterality: N/A;  . MOUTH SURGERY     wisdom teeth ext  . VAGINAL DELIVERY     x 4  . WISDOM TOOTH EXTRACTION  2012    OB History    Gravida Para Term Preterm AB Living   6 5 3 2 1 6    SAB TAB Ectopic Multiple Live Births   1 0 0 1 6  Home Medications    Prior to Admission medications   Medication Sig Start Date End Date Taking? Authorizing Provider  albuterol (PROVENTIL HFA;VENTOLIN HFA) 108 (90 BASE) MCG/ACT inhaler Inhale 2 puffs into the lungs every 6 (six) hours as needed for wheezing or shortness of breath. 08/20/14   Lezlie Lye, NP  dicyclomine (BENTYL) 20 MG tablet Take 1 tablet (20 mg total) by mouth 2 (two) times daily. 05/18/16   Waynetta Pean, PA-C  etonogestrel (NEXPLANON) 68 MG IMPL implant 1 each by Subdermal route once.    Historical Provider, MD  omeprazole (PRILOSEC) 20 MG capsule Take 1 capsule (20 mg total) by mouth  daily. 05/18/16   Waynetta Pean, PA-C  ondansetron (ZOFRAN ODT) 4 MG disintegrating tablet Take 1 tablet (4 mg total) by mouth every 8 (eight) hours as needed for nausea or vomiting. 05/18/16   Waynetta Pean, PA-C    Family History Family History  Problem Relation Age of Onset  . Hypertension Mother   . Asthma Mother   . Heart disease Mother   . Hyperlipidemia Mother   . Thyroid disease Mother   . Alcohol abuse Father   . Cancer Maternal Grandmother 37    COLON  . Depression Maternal Grandmother   . Alcohol abuse Maternal Grandmother   . Drug abuse Maternal Grandmother   . Alcohol abuse Maternal Grandfather   . Drug abuse Maternal Grandfather   . Stroke Maternal Grandfather   . Alcohol abuse Paternal Grandfather   . Drug abuse Paternal Grandfather   . Asthma Sister   . Depression Sister   . Diabetes Maternal Aunt   . HIV Maternal Aunt   . Asthma Sister     Social History Social History  Substance Use Topics  . Smoking status: Never Smoker  . Smokeless tobacco: Never Used  . Alcohol use No     Allergies   Review of patient's allergies indicates no known allergies.   Review of Systems Review of Systems  Constitutional: Negative for chills and fever.  HENT: Negative for congestion and sore throat.   Eyes: Negative for visual disturbance.  Respiratory: Negative for cough and shortness of breath.   Cardiovascular: Negative for chest pain.  Gastrointestinal: Positive for abdominal pain, nausea and vomiting. Negative for blood in stool and diarrhea.  Genitourinary: Negative for difficulty urinating, dysuria, flank pain, frequency, hematuria, menstrual problem, urgency, vaginal bleeding and vaginal discharge.  Musculoskeletal: Negative for back pain and neck pain.  Skin: Negative for rash.  Neurological: Negative for headaches.     Physical Exam Updated Vital Signs BP 123/86   Pulse (!) 53   Temp 98.3 F (36.8 C) (Oral)   Resp 16   LMP 05/03/2016   SpO2 99%     Physical Exam  Constitutional: She appears well-developed and well-nourished. No distress.  Nontoxic appearing.  HENT:  Head: Normocephalic and atraumatic.  Mouth/Throat: Oropharynx is clear and moist.  Eyes: Conjunctivae are normal. Pupils are equal, round, and reactive to light. Right eye exhibits no discharge. Left eye exhibits no discharge.  Neck: Neck supple.  Cardiovascular: Normal rate, regular rhythm, normal heart sounds and intact distal pulses.   Pulmonary/Chest: Effort normal and breath sounds normal. No respiratory distress. She has no wheezes. She has no rales.  Abdominal: Soft. Bowel sounds are normal. She exhibits no distension and no mass. There is tenderness. There is no rebound and no guarding.  Abdomen is soft and bowel sounds are present. Patient has mild epigastric tenderness to palpation.  No right upper quadrant tenderness. No Murphy's sign. No lower abdominal tenderness. No peritoneal signs. No CVA or flank tenderness.  Musculoskeletal: She exhibits no edema.  Lymphadenopathy:    She has no cervical adenopathy.  Neurological: She is alert. Coordination normal.  Skin: Skin is warm and dry. Capillary refill takes less than 2 seconds. No rash noted. She is not diaphoretic. No erythema. No pallor.  Psychiatric: She has a normal mood and affect. Her behavior is normal.  Nursing note and vitals reviewed.    ED Treatments / Results  Labs (all labs ordered are listed, but only abnormal results are displayed) Labs Reviewed  COMPREHENSIVE METABOLIC PANEL - Abnormal; Notable for the following:       Result Value   AST 13 (*)    ALT 11 (*)    All other components within normal limits  CBC WITH DIFFERENTIAL/PLATELET - Abnormal; Notable for the following:    Hemoglobin 11.9 (*)    All other components within normal limits  URINALYSIS, ROUTINE W REFLEX MICROSCOPIC (NOT AT [facility]) - Abnormal; Notable for the following:    Hgb urine dipstick TRACE (*)    Ketones, ur 15 (*)     All other components within normal limits  URINE MICROSCOPIC-ADD ON - Abnormal; Notable for the following:    Squamous Epithelial / LPF 0-5 (*)    All other components within normal limits  LIPASE, BLOOD  POC URINE PREG, ED    EKG  EKG Interpretation None       Radiology US Abdomen Limited Ruq  Result Date: 05/17/2016 CLINICAL DATA:  Upper abdominal pain, nausea vomiting. EXAM: US ABDOMEN LIMITED - RIGHT UPPER QUADRANT COMPARISON:  None. FINDINGS: Gallbladder: No gallstones or wall thickening visualized. No sonographic Murphy sign noted by sonographer. Common bile duct: Diameter: 4.3 mm Liver: No focal lesion identified. Within normal limits in parenchymal echogenicity. IMPRESSION: Normal sonographic evaluation of the right upper quadrant of the abdomen. Electronically Signed   By: Fidela Salisbury M.D.   On: 05/17/2016 17:37    Procedures Procedures (including critical care time)  Medications Ordered in ED Medications  ondansetron (ZOFRAN-ODT) disintegrating tablet 4 mg (4 mg Oral Given 05/18/16 1209)  gi cocktail (Maalox,Lidocaine,Donnatal) (30 mLs Oral Given 05/18/16 1210)  dicyclomine (BENTYL) capsule 20 mg (20 mg Oral Given 05/18/16 1249)     Initial Impression / Assessment and Plan / ED Course  I have reviewed the triage vital signs and the nursing notes.  Pertinent labs & imaging results that were available during my care of the patient were reviewed by me and considered in my medical decision making (see chart for details).  Clinical Course   This  is a 30 y.o. Female who presents to the emergency department complaining of epigastric abdominal pain for the past 3 days. Patient reports her pain is constant and she is unable to identify alleviating or aggravating factors. She denies pain worsening with eating. She reports nausea and vomiting. She reports she vomited once 2 days ago and once yesterday. No vomiting today. She does report nausea currently. No diarrhea. No  lower abdominal pain. No urinary symptoms. She has taken nothing for treatment of her symptoms today. She was seen at the [facility] yesterday and had blood work and a right upper quadrant ultrasound that was normal.  On exam the patient is afebrile nontoxic appearing. Her abdomen is soft and she has mild epigastric abdominal tenderness to palpation. No peritoneal signs. No Murphy's sign. No lower abdominal tenderness.  Urine pregnancy is negative. Urinalysis shows no sign of infection. There is trace hemoglobin in the patient reports she is currently on her menstrual cycle. CMP is unremarkable. Lipase is within normal limits. CBC shows no leukocytosis. Patient had a right upper quadrant ultrasound performed yesterday that was normal. Patient received Zofran and GI cocktail and still complained of some pain. She then received Bentyl and had recheck she reports her pain is completely resolved. She is tolerated ginger ale and reports feeling better and ready to go home. Patient's pain is possibly related to acid reflux or peptic ulcer. Will start the patient on omeprazole as well as Bentyl and Zofran as needed for nausea. Encourage close follow-up by her primary care provider and I discussed return precautions. I advised the patient to follow-up with their primary care provider this week. I advised the patient to return to the emergency department with new or worsening symptoms or new concerns. The patient verbalized understanding and agreement with plan.      Final Clinical Impressions(s) / ED Diagnoses   Final diagnoses:  Epigastric abdominal pain  Nausea    New Prescriptions New Prescriptions   DICYCLOMINE (BENTYL) 20 MG TABLET    Take 1 tablet (20 mg total) by mouth 2 (two) times daily.   OMEPRAZOLE (PRILOSEC) 20 MG CAPSULE    Take 1 capsule (20 mg total) by mouth daily.   ONDANSETRON (ZOFRAN ODT) 4 MG DISINTEGRATING TABLET    Take 1 tablet (4 mg total) by mouth every 8 (eight) hours as  needed for nausea or vomiting.     Waynetta Pean, PA-C 05/18/16 1405    Malvin Johns, MD 05/18/16 1538

## 2016-10-05 ENCOUNTER — Encounter (HOSPITAL_COMMUNITY): Payer: Self-pay | Admitting: *Deleted

## 2016-10-05 ENCOUNTER — Emergency Department (HOSPITAL_COMMUNITY)
Admission: EM | Admit: 2016-10-05 | Discharge: 2016-10-05 | Disposition: A | Discharge status: Home / Self Care | Payer: Medicaid Other | Attending: Emergency Medicine | Admitting: Emergency Medicine

## 2016-10-05 DIAGNOSIS — J111 Influenza due to unidentified influenza virus with other respiratory manifestations: Secondary | ICD-10-CM | POA: Insufficient documentation

## 2016-10-05 DIAGNOSIS — R69 Illness, unspecified: Secondary | ICD-10-CM

## 2016-10-05 DIAGNOSIS — R05 Cough: Secondary | ICD-10-CM | POA: Diagnosis present

## 2016-10-05 DIAGNOSIS — J45909 Unspecified asthma, uncomplicated: Secondary | ICD-10-CM | POA: Insufficient documentation

## 2016-10-05 MED ORDER — ONDANSETRON 8 MG PO TBDP: 8.0000 mg | ORAL_TABLET | Freq: Three times a day (TID) | ORAL | 0 refills | Status: DC | PRN

## 2016-10-05 MED ORDER — METOCLOPRAMIDE HCL 10 MG PO TABS: 10.0000 mg | ORAL_TABLET | Freq: Four times a day (QID) | ORAL | 0 refills | Status: DC

## 2016-10-05 NOTE — ED Triage Notes (Signed)
The pt has been ill with a cough  Non-productive since yesterday  swaeting and hot unknown temp  Nausea    lmp   Birth control    Headache

## 2016-10-05 NOTE — ED Notes (Signed)
Pt dressed in hospital gown

## 2016-10-05 NOTE — ED Provider Notes (Signed)
[place] DEPT Provider Note   CSN: [id number] Arrival date & time: 10/05/16  1803   By signing my name below, I, Delton Prairie, attest that this documentation has been prepared under the direction and in the presence of Pattricia Boss, MD  Electronically Signed: Delton Prairie, ED Scribe. 10/05/16. 7:44 PM.   History   Chief Complaint Chief Complaint  Patient presents with  . Cough   The history is provided by the patient. No language interpreter was used.   HPI Comments:  Lindsay Ramirez is a 31 y.o. female, with a hx of asthma, who presents to the Emergency Department complaining of sore throat x yesterday. Pt also reports a low grade fever, a dry cough, sharp abdominal pain and nausea. Pt denies SOB, wheezing, congestion, daily medication use, any known drug allergies, a change of being pregnant, vomiting, diarrhea and any sick contacts. No other associated symptoms noted. No flu shot this year.   Past Medical History:  Diagnosis Date  . Abnormal Pap smear    LAST PAP 06/2011  . Anemia    CHRONIC  . Asthma    rarely uses inhaler,  [facility]  . Bipolar affective disorder ([place])   . Bipolar affective disorder ([place]) 2006   TOOK MEDS X 3 YR  . Depression    PP 2013  . Gout    feet - no meds  . Headache(784.0)    otc med prn  . Infection    Hx -UTI  . Mental disorder   . Missed ab 10/2013  . Sickle cell trait ([place])   . SVD (spontaneous vaginal delivery)    x 4  . Vaginal Pap smear, abnormal     Patient Active Problem List   Diagnosis Date Noted  . S/P cesarean section 09/19/2014  . Poor fetal growth affecting management of mother in third trimester, antepartum   . Dichorionic diamniotic twin pregnancy in third trimester   . 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 Twin gestation, dichorionic diamniotic 05/04/2014  . Migraine without aura 03/25/2014  . 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 Gout 06/15/2013  . Bipolar disorder, unspecified/depression  11/11/2012  . 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 Acid reflux 06/21/2012    Past Surgical History:  Procedure Laterality Date  . CESAREAN SECTION MULTI-GESTATIONAL N/A 09/19/2014   Procedure: CESAREAN SECTION MULTI-GESTATIONAL;  Surgeon: Shelly Bombard, MD;  Location: [facility];  Service: Obstetrics;  Laterality: N/A;  . DILATION AND CURETTAGE OF UTERUS    . DILATION AND EVACUATION N/A 11/04/2013   Procedure: DILATATION AND EVACUATION;  Surgeon: Alwyn Pea, MD;  Location: [facility];  Service: Gynecology;  Laterality: N/A;  . MOUTH SURGERY     wisdom teeth ext  . VAGINAL DELIVERY     x 4  . WISDOM TOOTH EXTRACTION  2012    OB History    Gravida Para Term Preterm AB Living   6 5 3 2 1 6    SAB TAB Ectopic Multiple Live Births   1 0 0 1 6       Home Medications    Prior to Admission medications   Medication Sig Start Date End Date Taking? Authorizing Provider  albuterol (PROVENTIL HFA;VENTOLIN HFA) 108 (90 BASE) MCG/ACT inhaler Inhale 2 puffs into the lungs every 6 (six) hours as needed for wheezing or shortness of breath. 08/20/14   Lezlie Lye, NP  dicyclomine (BENTYL) 20 MG tablet Take 1 tablet (20 mg total) by mouth 2 (  two) times daily. 05/18/16   Waynetta Pean, PA-C  etonogestrel (NEXPLANON) 68 MG IMPL implant 1 each by Subdermal route once.    Historical Provider, MD  omeprazole (PRILOSEC) 20 MG capsule Take 1 capsule (20 mg total) by mouth daily. 05/18/16   Waynetta Pean, PA-C  ondansetron (ZOFRAN ODT) 4 MG disintegrating tablet Take 1 tablet (4 mg total) by mouth every 8 (eight) hours as needed for nausea or vomiting. 05/18/16   Waynetta Pean, PA-C    Family History Family History  Problem Relation Age of Onset  . Hypertension Mother   . Asthma Mother   . Heart disease Mother   . Hyperlipidemia Mother   . Thyroid disease Mother   . Alcohol abuse Father   . Cancer Maternal Grandmother 37    COLON  . Depression Maternal Grandmother   . Alcohol abuse  Maternal Grandmother   . Drug abuse Maternal Grandmother   . Alcohol abuse Maternal Grandfather   . Drug abuse Maternal Grandfather   . Stroke Maternal Grandfather   . Alcohol abuse Paternal Grandfather   . Drug abuse Paternal Grandfather   . Asthma Sister   . Depression Sister   . Diabetes Maternal Aunt   . HIV Maternal Aunt   . Asthma Sister     Social History Social History  Substance Use Topics  . Smoking status: Never Smoker  . Smokeless tobacco: Never Used  . Alcohol use No     Allergies   Patient has no known allergies.   Review of Systems Review of Systems  Constitutional: Positive for fever (low-grade).  HENT: Positive for sore throat. Negative for congestion.   Respiratory: Positive for cough. Negative for shortness of breath and wheezing.   Gastrointestinal: Positive for abdominal pain and nausea. Negative for diarrhea and vomiting.  All other systems reviewed and are negative.  Physical Exam Updated Vital Signs BP 117/78   Pulse 92   Temp 100.2 F (37.9 C)   Resp 18   Ht 5\' 2"  (1.575 m)   Wt 201 lb 8 oz (91.4 kg)   SpO2 100%   BMI 36.85 kg/m   Physical Exam  Constitutional: She is oriented to person, place, and time. She appears well-developed and well-nourished. No distress.  HENT:  Head: Normocephalic and atraumatic.  Eyes: Conjunctivae are normal.  Cardiovascular: Normal rate, regular rhythm and normal heart sounds.   Pulmonary/Chest: Effort normal and breath sounds normal. No respiratory distress. She has no wheezes. She has no rales.  Abdominal: Soft. Bowel sounds are normal. She exhibits no distension.  Musculoskeletal: Normal range of motion.  Neurological: She is alert and oriented to person, place, and time.  Skin: Skin is warm and dry.  Psychiatric: She has a normal mood and affect.  Nursing note and vitals reviewed.    ED Treatments / Results  DIAGNOSTIC STUDIES:  Oxygen Saturation is 100% on RA, normal by my interpretation.      COORDINATION OF CARE:  7:43 PM Discussed treatment plan with pt at bedside and pt agreed to plan.  Labs (all labs ordered are listed, but only abnormal results are displayed) Labs Reviewed - No data to display  EKG  EKG Interpretation None       Radiology No results found.  Procedures Procedures (including critical care time)  Medications Ordered in ED Medications - No data to display   Initial Impression / Assessment and Plan / ED Course  I have reviewed the triage vital signs and the nursing notes.  Pertinent labs & imaging results that were available during my care of the patient were reviewed by me and considered in my medical decision making (see chart for details).     Patient with symptoms consistent with influenza.  Vitals are stable, low-grade fever.  No signs of dehydration, tolerating PO's.  Lungs are clear. Due to patient's presentation and physical exam a chest x-Bastien Strawser was not ordered bc likely diagnosis of flu.  Patient will be discharged with instructions to orally hydrate, rest, and use over-the-counter medications such as anti-inflammatories ibuprofen and Aleve for muscle aches and Tylenol for fever.  Patient will also be given Zofran and Reglan.    Final Clinical Impressions(s) / ED Diagnoses   Final diagnoses:  Influenza-like illness    New Prescriptions New Prescriptions   No medications on file  I personally performed the services described in this documentation, which was scribed in my presence. The recorded information has been reviewed and considered.    Pattricia Boss, MD 10/06/16 2000

## 2016-10-05 NOTE — ED Notes (Signed)
Acuity and assessment by chris Arthella Headings rn not keith emt

## 2017-01-19 ENCOUNTER — Ambulatory Visit: Payer: Medicaid Other | Admitting: Certified Nurse Midwife

## 2017-06-04 ENCOUNTER — Ambulatory Visit: Payer: Medicaid Other | Admitting: Obstetrics & Gynecology

## 2017-12-09 ENCOUNTER — Encounter: Payer: Self-pay | Admitting: Obstetrics

## 2017-12-09 ENCOUNTER — Ambulatory Visit: Payer: Medicaid Other | Admitting: Obstetrics

## 2017-12-09 VITALS — BP 132/78 | HR 65 | Temp 99.2°F | Resp 16 | Wt 210.8 lb

## 2017-12-09 DIAGNOSIS — Z3049 Encounter for surveillance of other contraceptives: Secondary | ICD-10-CM

## 2017-12-09 DIAGNOSIS — Z3046 Encounter for surveillance of implantable subdermal contraceptive: Secondary | ICD-10-CM

## 2017-12-09 NOTE — Progress Notes (Signed)
[place] REMOVAL NOTE  Date of LMP:   unknown  Contraception used: *Nexplanon   Indications:  The patient desires removal of Nexplanon.  She understands risks, benefits, and alternatives to Implanon and would like to proceed.  Anesthesia:   Lidocaine 1% plain.  Procedure:  A time-out was performed confirming the procedure and the patient's allergy status.  Complications: None                      The rod was palpated and the area was sterilely prepped.  The area beneath the distal tip was anesthetized with 1% xylocaine and the skin incised                       Over the tip and the tip was exposed, grasped with forcep and removed.  Steri strip                       And a bandage applied and the arm was wrapped with gauze bandage.  The patient tolerated well.  Instructions:  The patient was instructed to remove the dressing in 24 hours and that some bruising is to be expected.  She was advised to use over the counter analgesics as needed for any pain at the site.  She is to keep the area dry for 24 hours and to call if her hand or arm becomes cold, numb, or blue.  Return visit:  Return in 2 weeks   Shelly Bombard MD 12-09-2017

## 2017-12-11 ENCOUNTER — Encounter: Payer: Self-pay | Admitting: *Deleted

## 2017-12-23 ENCOUNTER — Encounter: Payer: Self-pay | Admitting: Obstetrics

## 2017-12-23 ENCOUNTER — Ambulatory Visit (INDEPENDENT_AMBULATORY_CARE_PROVIDER_SITE_OTHER): Payer: Medicaid Other | Admitting: Obstetrics

## 2017-12-23 VITALS — BP 119/73 | HR 72 | Temp 98.9°F | Resp 16 | Wt 211.7 lb

## 2017-12-23 DIAGNOSIS — Z09 Encounter for follow-up examination after completed treatment for conditions other than malignant neoplasm: Secondary | ICD-10-CM

## 2017-12-23 DIAGNOSIS — F319 Bipolar disorder, unspecified: Secondary | ICD-10-CM

## 2017-12-23 NOTE — Progress Notes (Addendum)
Subjective:    Lindsay Ramirez is a 32 y.o. female who presents for POST OP CHECK AFTER NEXPLANON REMOVAL. The patient has no complaints today. The patient is not sexually active. Pertinent past medical history: none.  The information documented in the HPI was reviewed and verified.  Menstrual History: OB History    Gravida  6   Para  5   Term  3   Preterm  2   AB  1   Living  6     SAB  1   TAB  0   Ectopic  0   Multiple  1   Live Births  6           Patient's last menstrual period was 12/20/2017 (approximate).   Patient Active Problem List   Diagnosis Date Noted  . S/P cesarean section 09/19/2014  . Poor fetal growth affecting management of mother in third trimester, antepartum   . Dichorionic diamniotic twin pregnancy in third trimester   . 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 Twin gestation, dichorionic diamniotic 05/04/2014  . Migraine without aura 03/25/2014  . 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 Gout 06/15/2013  . Bipolar disorder, unspecified/depression 11/11/2012  . 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 Acid reflux 06/21/2012   Past Medical History:  Diagnosis Date  . Abnormal Pap smear    LAST PAP 06/2011  . Anemia    CHRONIC  . Asthma    rarely uses inhaler,  [facility]  . Bipolar affective disorder ([place])   . Bipolar affective disorder ([place]) 2006   TOOK MEDS X 3 YR  . Depression    PP 2013  . Gout    feet - no meds  . Headache(784.0)    otc med prn  . Infection    Hx -UTI  . Mental disorder   . Missed ab 10/2013  . Sickle cell trait ([place])   . SVD (spontaneous vaginal delivery)    x 4  . Vaginal Pap smear, abnormal     Past Surgical History:  Procedure Laterality Date  . CESAREAN SECTION MULTI-GESTATIONAL N/A 09/19/2014   Procedure: CESAREAN SECTION MULTI-GESTATIONAL;  Surgeon: Shelly Bombard, MD;  Location: [facility];  Service: Obstetrics;  Laterality: N/A;  . DILATION AND CURETTAGE OF UTERUS    . DILATION  AND EVACUATION N/A 11/04/2013   Procedure: DILATATION AND EVACUATION;  Surgeon: Alwyn Pea, MD;  Location: [facility];  Service: Gynecology;  Laterality: N/A;  . MOUTH SURGERY     wisdom teeth ext  . VAGINAL DELIVERY     x 4  . WISDOM TOOTH EXTRACTION  2012    No current outpatient medications on file. No Known Allergies  Social History   Tobacco Use  . Smoking status: Never Smoker  . Smokeless tobacco: Never Used  Substance Use Topics  . Alcohol use: No    Alcohol/week: 0.0 oz    Family History  Problem Relation Age of Onset  . Hypertension Mother   . Asthma Mother   . Heart disease Mother   . Hyperlipidemia Mother   . Thyroid disease Mother   . Alcohol abuse Father   . Cancer Maternal Grandmother 37       COLON  . Depression Maternal Grandmother   . Alcohol abuse Maternal Grandmother   . Drug abuse Maternal Grandmother   . Alcohol abuse Maternal Grandfather   . Drug abuse Maternal Grandfather   . Stroke Maternal Grandfather   .  Alcohol abuse Paternal Grandfather   . Drug abuse Paternal Grandfather   . Asthma Sister   . Depression Sister   . Diabetes Maternal Aunt   . HIV Maternal Aunt   . Asthma Sister        Review of Systems Constitutional: negative for weight loss Genitourinary:negative for abnormal menstrual periods and vaginal discharge   Objective:   BP 119/73 (BP Location: Right Arm, Patient Position: Sitting, Cuff Size: Large)   Pulse 72   Temp 98.9 F (37.2 C) (Oral)   Resp 16   Wt 211 lb 11.2 oz (96 kg)   LMP 12/20/2017 (Approximate)   BMI 38.72 kg/m    General:   alert  Skin:   no rash or abnormalities.  Nexplanon removal site is healed, non tender  Lungs:   clear to auscultation bilaterally  Heart:   regular rate and rhythm, S1, S2 normal, no murmur, click, rub or gallop  Lab Review Urine pregnancy test Labs reviewed yes Radiologic studies reviewed no  50% of 15 min visit spent on counseling and coordination of care.     Assessment:    32 y.o., discontinuing Nexplanon.  Declines contraception.    Plan:      Not sexually active.  Will call when she desires to resume contraception.  All questions answered. Discussed healthy lifestyle modifications. Follow up in 3 months.  Annual.   No orders of the defined types were placed in this encounter.  Orders Placed This Encounter  Procedures  . Ambulatory referral to Internal Medicine    Referral Priority:   Routine    Referral Type:   Consultation    Referral Reason:   Specialty Services Required    Requested Specialty:   Internal Medicine    Number of Visits Requested:   1    Shelly Bombard MD 12-23-2017

## 2018-04-07 ENCOUNTER — Ambulatory Visit (INDEPENDENT_AMBULATORY_CARE_PROVIDER_SITE_OTHER): Payer: Medicaid Other

## 2018-04-07 ENCOUNTER — Encounter: Payer: Self-pay | Admitting: *Deleted

## 2018-04-07 VITALS — BP 120/79 | HR 69 | Wt 210.2 lb

## 2018-04-07 DIAGNOSIS — Z3A01 Less than 8 weeks gestation of pregnancy: Secondary | ICD-10-CM

## 2018-04-07 LAB — POCT URINE PREGNANCY: Preg Test, Ur: POSITIVE — AB

## 2018-04-07 NOTE — Progress Notes (Signed)
Pt is here for UPT, UPT is positive. Pt states her LMP was 03/09/18. Advised pt to make new OB appt and gave her safe med list and prenatal vitamins.

## 2018-04-12 ENCOUNTER — Inpatient Hospital Stay (HOSPITAL_COMMUNITY)
Admission: AD | Admit: 2018-04-12 | Discharge: 2018-04-12 | Disposition: A | Discharge status: Home / Self Care | Payer: Medicaid Other | Source: Ambulatory Visit | Attending: Obstetrics & Gynecology | Admitting: Obstetrics & Gynecology

## 2018-04-12 ENCOUNTER — Inpatient Hospital Stay (HOSPITAL_COMMUNITY): Payer: Medicaid Other

## 2018-04-12 ENCOUNTER — Telehealth: Payer: Self-pay

## 2018-04-12 ENCOUNTER — Encounter (HOSPITAL_COMMUNITY): Payer: Self-pay | Admitting: *Deleted

## 2018-04-12 DIAGNOSIS — R109 Unspecified abdominal pain: Secondary | ICD-10-CM | POA: Diagnosis present

## 2018-04-12 DIAGNOSIS — O99511 Diseases of the respiratory system complicating pregnancy, first trimester: Secondary | ICD-10-CM | POA: Insufficient documentation

## 2018-04-12 DIAGNOSIS — O26891 Other specified pregnancy related conditions, first trimester: Secondary | ICD-10-CM | POA: Diagnosis not present

## 2018-04-12 DIAGNOSIS — M109 Gout, unspecified: Secondary | ICD-10-CM | POA: Insufficient documentation

## 2018-04-12 DIAGNOSIS — Z3A01 Less than 8 weeks gestation of pregnancy: Secondary | ICD-10-CM | POA: Diagnosis not present

## 2018-04-12 DIAGNOSIS — J45909 Unspecified asthma, uncomplicated: Secondary | ICD-10-CM | POA: Insufficient documentation

## 2018-04-12 DIAGNOSIS — O3680X0 Pregnancy with inconclusive fetal viability, not applicable or unspecified: Secondary | ICD-10-CM

## 2018-04-12 DIAGNOSIS — D573 Sickle-cell trait: Secondary | ICD-10-CM | POA: Insufficient documentation

## 2018-04-12 DIAGNOSIS — O99341 Other mental disorders complicating pregnancy, first trimester: Secondary | ICD-10-CM | POA: Diagnosis not present

## 2018-04-12 DIAGNOSIS — F319 Bipolar disorder, unspecified: Secondary | ICD-10-CM | POA: Insufficient documentation

## 2018-04-12 DIAGNOSIS — O26899 Other specified pregnancy related conditions, unspecified trimester: Secondary | ICD-10-CM

## 2018-04-12 LAB — URINALYSIS, ROUTINE W REFLEX MICROSCOPIC
Bacteria, UA: NONE SEEN
Bilirubin Urine: NEGATIVE
GLUCOSE, UA: NEGATIVE mg/dL
HGB URINE DIPSTICK: NEGATIVE
Ketones, ur: NEGATIVE mg/dL
Nitrite: NEGATIVE
Protein, ur: NEGATIVE mg/dL
SPECIFIC GRAVITY, URINE: 1.014 (ref 1.005–1.030)
pH: 6 (ref 5.0–8.0)

## 2018-04-12 LAB — CBC
HEMATOCRIT: 34.6 % — AB (ref 36.0–46.0)
Hemoglobin: 11.5 g/dL — ABNORMAL LOW (ref 12.0–15.0)
MCH: 27.4 pg (ref 26.0–34.0)
MCHC: 33.2 g/dL (ref 30.0–36.0)
MCV: 82.6 fL (ref 78.0–100.0)
Platelets: 301 10*3/uL (ref 150–400)
RBC: 4.19 MIL/uL (ref 3.87–5.11)
RDW: 13.5 % (ref 11.5–15.5)
WBC: 6.6 10*3/uL (ref 4.0–10.5)

## 2018-04-12 LAB — WET PREP, GENITAL
Clue Cells Wet Prep HPF POC: NONE SEEN
SPERM: NONE SEEN
Trich, Wet Prep: NONE SEEN
Yeast Wet Prep HPF POC: NONE SEEN

## 2018-04-12 LAB — HCG, QUANTITATIVE, PREGNANCY: hCG, Beta Chain, Quant, S: 4376 m[IU]/mL — ABNORMAL HIGH (ref <5)

## 2018-04-12 NOTE — MAU Note (Signed)
abd pain for last several days, pain is constant, denies bleeding.

## 2018-04-12 NOTE — Discharge Instructions (Signed)
First Trimester of Pregnancy The first trimester of pregnancy is from week 1 until the end of week 13 (months 1 through 3). During this time, your baby will begin to develop inside you. At 6-8 weeks, the eyes and face are formed, and the heartbeat can be seen on ultrasound. At the end of 12 weeks, all the baby's organs are formed. Prenatal care is all the medical care you receive before the birth of your baby. Make sure you get good prenatal care and follow all of your doctor's instructions. Follow these instructions at home: Medicines  Take over-the-counter and prescription medicines only as told by your doctor. Some medicines are safe and some medicines are not safe during pregnancy.  Take a prenatal vitamin that contains at least 600 micrograms (mcg) of folic acid.  If you have trouble pooping (constipation), take medicine that will make your stool soft (stool softener) if your doctor approves. Eating and drinking  Eat regular, healthy meals.  Your doctor will tell you the amount of weight gain that is right for you.  Avoid raw meat and uncooked cheese.  If you feel sick to your stomach (nauseous) or throw up (vomit): ? Eat 4 or 5 small meals a day instead of 3 large meals. ? Try eating a few soda crackers. ? Drink liquids between meals instead of during meals.  To prevent constipation: ? Eat foods that are high in fiber, like fresh fruits and vegetables, whole grains, and beans. ? Drink enough fluids to keep your pee (urine) clear or pale yellow. Activity  Exercise only as told by your doctor. Stop exercising if you have cramps or pain in your lower belly (abdomen) or low back.  Do not exercise if it is too hot, too humid, or if you are in a place of great height (high altitude).  Try to avoid standing for long periods of time. Move your legs often if you must stand in one place for a long time.  Avoid heavy lifting.  Wear low-heeled shoes. Sit and stand up straight.  You  can have sex unless your doctor tells you not to. Relieving pain and discomfort  Wear a good support bra if your breasts are sore.  Take warm water baths (sitz baths) to soothe pain or discomfort caused by hemorrhoids. Use hemorrhoid cream if your doctor says it is okay.  Rest with your legs raised if you have leg cramps or low back pain.  If you have puffy, bulging veins (varicose veins) in your legs: ? Wear support hose or compression stockings as told by your doctor. ? Raise (elevate) your feet for 15 minutes, 3-4 times a day. ? Limit salt in your food. Prenatal care  Schedule your prenatal visits by the twelfth week of pregnancy.  Write down your questions. Take them to your prenatal visits.  Keep all your prenatal visits as told by your doctor. This is important. Safety  Wear your seat belt at all times when driving.  Make a list of emergency phone numbers. The list should include numbers for family, friends, the hospital, and police and fire departments. General instructions  Ask your doctor for a referral to a local prenatal class. Begin classes no later than at the start of month 6 of your pregnancy.  Ask for help if you need counseling or if you need help with nutrition. Your doctor can give you advice or tell you where to go for help.  Do not use hot tubs, steam rooms, or   saunas.  Do not douche or use tampons or scented sanitary pads.  Do not cross your legs for long periods of time.  Avoid all herbs and alcohol. Avoid drugs that are not approved by your doctor.  Do not use any tobacco products, including cigarettes, chewing tobacco, and electronic cigarettes. If you need help quitting, ask your doctor. You may get counseling or other support to help you quit.  Avoid cat litter boxes and soil used by cats. These carry germs that can cause birth defects in the baby and can cause a loss of your baby (miscarriage) or stillbirth.  Visit your dentist. At home, brush  your teeth with a soft toothbrush. Be gentle when you floss. Contact a doctor if:  You are dizzy.  You have mild cramps or pressure in your lower belly.  You have a nagging pain in your belly area.  You continue to feel sick to your stomach, you throw up, or you have watery poop (diarrhea).  You have a bad smelling fluid coming from your vagina.  You have pain when you pee (urinate).  You have increased puffiness (swelling) in your face, hands, legs, or ankles. Get help right away if:  You have a fever.  You are leaking fluid from your vagina.  You have spotting or bleeding from your vagina.  You have very bad belly cramping or pain.  You gain or lose weight rapidly.  You throw up blood. It may look like coffee grounds.  You are around people who have German measles, fifth disease, or chickenpox.  You have a very bad headache.  You have shortness of breath.  You have any kind of trauma, such as from a fall or a car accident. Summary  The first trimester of pregnancy is from week 1 until the end of week 13 (months 1 through 3).  To take care of yourself and your unborn baby, you will need to eat healthy meals, take medicines only if your doctor tells you to do so, and do activities that are safe for you and your baby.  Keep all follow-up visits as told by your doctor. This is important as your doctor will have to ensure that your baby is healthy and growing well. This information is not intended to replace advice given to you by your health care provider. Make sure you discuss any questions you have with your health care provider. Document Released: 02/11/2008 Document Revised: 09/02/2016 Document Reviewed: 09/02/2016 Elsevier Interactive Patient Education  2017 Elsevier Inc.  

## 2018-04-12 NOTE — Telephone Encounter (Signed)
Pt called stating that she is having really bad abdominal pain. She is currently pregnant. She denies having any bleeding. I advised pt to go to MAU for evaluation. Pt is not schedule for New OB until 05/09/18.

## 2018-04-12 NOTE — MAU Provider Note (Signed)
History     CSN: [id number]  Arrival date and time: 04/12/18 1027   First Provider Initiated Contact with Patient 04/12/18 1347      Chief Complaint  Patient presents with  . Abdominal Pain   HPI  Lindsay Ramirez is a 32 y.o. [id number] at [redacted]w[redacted]d who presents to MAU today with complaint of abdominal pain x 3 days. She has tried Tylenol without relief. She rates pain at 7/10 now. It is located in the bilateral lower abdomen. She denies bleeding, discharge, UTI symptoms, vomiting or diarrhea. She states frequent nausea and mild constipation. Last BM was a few days ago.   OB History    Gravida  7   Para  5   Term  3   Preterm  2   AB  1   Living  6     SAB  1   TAB  0   Ectopic  0   Multiple  1   Live Births  6           Past Medical History:  Diagnosis Date  . Abnormal Pap smear    LAST PAP 06/2011  . Anemia    CHRONIC  . Asthma    rarely uses inhaler,  [facility]  . Bipolar affective disorder ([place])   . Bipolar affective disorder ([place]) 2006   TOOK MEDS X 3 YR  . Depression    PP 2013  . Gout    feet - no meds  . Headache(784.0)    otc med prn  . Infection    Hx -UTI  . Mental disorder   . Missed ab 10/2013  . Sickle cell trait ([place])   . SVD (spontaneous vaginal delivery)    x 4  . Vaginal Pap smear, abnormal     Past Surgical History:  Procedure Laterality Date  . CESAREAN SECTION MULTI-GESTATIONAL N/A 09/19/2014   Procedure: CESAREAN SECTION MULTI-GESTATIONAL;  Surgeon: Shelly Bombard, MD;  Location: [facility];  Service: Obstetrics;  Laterality: N/A;  . DILATION AND CURETTAGE OF UTERUS    . DILATION AND EVACUATION N/A 11/04/2013   Procedure: DILATATION AND EVACUATION;  Surgeon: Alwyn Pea, MD;  Location: [facility];  Service: Gynecology;  Laterality: N/A;  . MOUTH SURGERY     wisdom teeth ext  . VAGINAL DELIVERY     x 4  . WISDOM TOOTH EXTRACTION  2012    Family History  Problem Relation Age of Onset  . Hypertension Mother   .  Asthma Mother   . Heart disease Mother   . Hyperlipidemia Mother   . Thyroid disease Mother   . Alcohol abuse Father   . Cancer Maternal Grandmother 37       COLON  . Depression Maternal Grandmother   . Alcohol abuse Maternal Grandmother   . Drug abuse Maternal Grandmother   . Alcohol abuse Maternal Grandfather   . Drug abuse Maternal Grandfather   . Stroke Maternal Grandfather   . Alcohol abuse Paternal Grandfather   . Drug abuse Paternal Grandfather   . Asthma Sister   . Depression Sister   . Diabetes Maternal Aunt   . HIV Maternal Aunt   . Asthma Sister     Social History   Tobacco Use  . Smoking status: Never Smoker  . Smokeless tobacco: Never Used  Substance Use Topics  . Alcohol use: No    Alcohol/week: 0.0 oz  . Drug use: No    Allergies: No  Known Allergies  No medications prior to admission.    Review of Systems  Constitutional: Negative for fever.  Gastrointestinal: Positive for abdominal pain, constipation and nausea. Negative for diarrhea and vomiting.  Genitourinary: Negative for dysuria, frequency, urgency, vaginal bleeding and vaginal discharge.   Physical Exam   Blood pressure 113/70, pulse 68, temperature 98.5 F (36.9 C), resp. rate 16, height 5\' 2"  (1.575 m), weight 211 lb (95.7 kg), last menstrual period 03/09/2018, SpO2 100 %.  Physical Exam  Nursing note and vitals reviewed. Constitutional: She is oriented to person, place, and time. She appears well-developed and well-nourished. No distress.  HENT:  Head: Normocephalic and atraumatic.  Cardiovascular: Normal rate.  Respiratory: Effort normal.  GI: Soft. She exhibits no distension and no mass. There is no tenderness. There is no rebound and no guarding.  Genitourinary: Uterus is not enlarged and not tender. Cervix exhibits no motion tenderness and no discharge. Right adnexum displays no mass and no tenderness. Left adnexum displays no mass and no tenderness. No bleeding in the vagina.  Vaginal discharge (small, white) found.  Neurological: She is alert and oriented to person, place, and time.  Skin: Skin is warm and dry. No erythema.  Psychiatric: She has a normal mood and affect.  Cervix: closed, thick, firm  Results for orders placed or performed during the hospital encounter of 04/12/18 (from the past 24 hour(s))  CBC     Status: Abnormal   Collection Time: 04/12/18 11:08 AM  Result Value Ref Range   WBC 6.6 4.0 - 10.5 K/uL   RBC 4.19 3.87 - 5.11 MIL/uL   Hemoglobin 11.5 (L) 12.0 - 15.0 g/dL   HCT 34.6 (L) 36.0 - 46.0 %   MCV 82.6 78.0 - 100.0 fL   MCH 27.4 26.0 - 34.0 pg   MCHC 33.2 30.0 - 36.0 g/dL   RDW 13.5 11.5 - 15.5 %   Platelets 301 150 - 400 K/uL  hCG, quantitative, pregnancy     Status: Abnormal   Collection Time: 04/12/18 11:08 AM  Result Value Ref Range   hCG, Beta Chain, Quant, S 4,376 (H) <5 mIU/mL  Urinalysis, Routine w reflex microscopic     Status: Abnormal   Collection Time: 04/12/18 11:10 AM  Result Value Ref Range   Color, Urine YELLOW YELLOW   APPearance HAZY (A) CLEAR   Specific Gravity, Urine 1.014 1.005 - 1.030   pH 6.0 5.0 - 8.0   Glucose, UA NEGATIVE NEGATIVE mg/dL   Hgb urine dipstick NEGATIVE NEGATIVE   Bilirubin Urine NEGATIVE NEGATIVE   Ketones, ur NEGATIVE NEGATIVE mg/dL   Protein, ur NEGATIVE NEGATIVE mg/dL   Nitrite NEGATIVE NEGATIVE   Leukocytes, UA LARGE (A) NEGATIVE   RBC / HPF 0-5 0 - 5 RBC/hpf   WBC, UA 0-5 0 - 5 WBC/hpf   Bacteria, UA NONE SEEN NONE SEEN   Squamous Epithelial / LPF 11-20 0 - 5   Mucus PRESENT   Wet prep, genital     Status: Abnormal   Collection Time: 04/12/18 11:16 AM  Result Value Ref Range   Yeast Wet Prep HPF POC NONE SEEN NONE SEEN   Trich, Wet Prep NONE SEEN NONE SEEN   Clue Cells Wet Prep HPF POC NONE SEEN NONE SEEN   WBC, Wet Prep HPF POC FEW (A) NONE SEEN   Sperm NONE SEEN     US Ob Less Than 14 Weeks With Ob Transvaginal  Result Date: 04/12/2018 CLINICAL DATA:  Pregnant,  abdominal pain  EXAM: OBSTETRIC <14 WK [place] AND TRANSVAGINAL OB US TECHNIQUE: Both transabdominal and transvaginal ultrasound examinations were performed for complete evaluation of the gestation as well as the maternal uterus, adnexal regions, and pelvic cul-de-sac. Transvaginal technique was performed to assess early pregnancy. COMPARISON:  None. FINDINGS: Intrauterine gestational sac: Single, located in the lower uterine body Yolk sac:  Not Visualized. Embryo:  Not Visualized. MSD: 7.3 mm   5 w   3 d Subchorionic hemorrhage:  None visualized. Maternal uterus/adnexae: 1.3 x 1.5 x 1.4 cm posterior uterine body fibroid. Left ovary is within normal limits. Right ovary is notable for a 6.7 x 5.4 x 7.0 cm simple cyst/follicle. No free fluid. IMPRESSION: Single intrauterine gestational sac, measuring 5 weeks 3 days by mean sac diameter. No yolk sac or fetal pole is visualized. Follow-up pelvic ultrasound is suggested in 14 days to confirm viability, as clinically warranted. Electronically Signed   By: Julian Hy M.D.   On: 04/12/2018 13:05    MAU Course  Procedures None  MDM +UPT UA, wet prep, GC/chlamydia, CBC, quant hCG, HIV, RPR and [place] today to rule out ectopic pregnancy  Assessment and Plan  A: Pregnancy of unknown location Abdominal pain in pregnancy, first trimester   P Discharge home Tylenol PRN for pain  Ectopic precautions discussed Patient advised to follow-up with [facility] on Wednesday for repeat hCG  Patient may return to MAU as needed or if her condition were to change or worsen  Kerry Hough, PA-C 04/12/2018, 2:44 PM

## 2018-04-13 LAB — GC/CHLAMYDIA PROBE AMP (~~LOC~~) NOT AT ARMC
Chlamydia: NEGATIVE
NEISSERIA GONORRHEA: NEGATIVE

## 2018-04-14 ENCOUNTER — Telehealth: Payer: Self-pay | Admitting: General Practice

## 2018-04-14 ENCOUNTER — Ambulatory Visit (INDEPENDENT_AMBULATORY_CARE_PROVIDER_SITE_OTHER): Payer: Medicaid Other | Admitting: General Practice

## 2018-04-14 DIAGNOSIS — O3680X0 Pregnancy with inconclusive fetal viability, not applicable or unspecified: Secondary | ICD-10-CM

## 2018-04-14 DIAGNOSIS — O283 Abnormal ultrasonic finding on antenatal screening of mother: Secondary | ICD-10-CM

## 2018-04-14 LAB — HCG, QUANTITATIVE, PREGNANCY: hCG, Beta Chain, Quant, S: 9388 m[IU]/mL — ABNORMAL HIGH (ref <5)

## 2018-04-14 NOTE — Progress Notes (Addendum)
Patient presents to office today for stat bhcg. Patient denies pain or bleeding. Discussed with patient we are monitoring your bhcg levels today & asked she wait in lobby for results/updated plan of care. Patient verbalized understanding & had no questions at this time.    Lab had an issue with processing and time was extended to 3.5 hours for results, per Dr Ihor Dow patient can be contacted with results. Patient informed & I will call her with results.  Reviewed results with Dr Ihor Dow, who finds appropriate rise in bhcg levels. Patient should have follow up ultrasound in 1 week. Scheduled for 8/15 @ 2pm. Will call patient with results & appt.  Attestation of Attending Supervision of RN: Evaluation and management procedures were performed by the nurse under my supervision and collaboration.  I have reviewed the nursing note and chart, and I agree with the management and plan.  Carolyn L. Harraway-Smith, M.D., [place]lynn June

## 2018-04-14 NOTE — Telephone Encounter (Signed)
Called patient & informed her of results & u/s appt. Patient verbalized understanding to all & asked about UA results regarding leukocytes. Told patient it could be a normal finding or could indicate UTI. Told patient if she becomes symptomatic to return to the office. Patient verbalized understanding and had no questions.

## 2018-04-18 ENCOUNTER — Inpatient Hospital Stay (HOSPITAL_COMMUNITY)
Admission: AD | Admit: 2018-04-18 | Discharge: 2018-04-18 | Disposition: A | Discharge status: Home / Self Care | Payer: Medicaid Other | Attending: Obstetrics and Gynecology | Admitting: Obstetrics and Gynecology

## 2018-04-18 ENCOUNTER — Inpatient Hospital Stay (HOSPITAL_COMMUNITY): Payer: Medicaid Other

## 2018-04-18 ENCOUNTER — Other Ambulatory Visit: Payer: Self-pay

## 2018-04-18 DIAGNOSIS — O219 Vomiting of pregnancy, unspecified: Secondary | ICD-10-CM | POA: Insufficient documentation

## 2018-04-18 DIAGNOSIS — Z3A01 Less than 8 weeks gestation of pregnancy: Secondary | ICD-10-CM | POA: Diagnosis not present

## 2018-04-18 DIAGNOSIS — O209 Hemorrhage in early pregnancy, unspecified: Secondary | ICD-10-CM | POA: Diagnosis present

## 2018-04-18 LAB — HCG, QUANTITATIVE, PREGNANCY: HCG, BETA CHAIN, QUANT, S: 22619 m[IU]/mL — AB (ref <5)

## 2018-04-18 LAB — CBC
HEMATOCRIT: 34.4 % — AB (ref 36.0–46.0)
HEMOGLOBIN: 11.4 g/dL — AB (ref 12.0–15.0)
MCH: 27.4 pg (ref 26.0–34.0)
MCHC: 33.1 g/dL (ref 30.0–36.0)
MCV: 82.7 fL (ref 78.0–100.0)
Platelets: 252 10*3/uL (ref 150–400)
RBC: 4.16 MIL/uL (ref 3.87–5.11)
RDW: 13.8 % (ref 11.5–15.5)
WBC: 6.1 10*3/uL (ref 4.0–10.5)

## 2018-04-18 LAB — URINALYSIS, ROUTINE W REFLEX MICROSCOPIC
BACTERIA UA: NONE SEEN
Bilirubin Urine: NEGATIVE
Glucose, UA: NEGATIVE mg/dL
KETONES UR: NEGATIVE mg/dL
LEUKOCYTES UA: NEGATIVE
Nitrite: NEGATIVE
PROTEIN: NEGATIVE mg/dL
Specific Gravity, Urine: 1.011 (ref 1.005–1.030)
pH: 7 (ref 5.0–8.0)

## 2018-04-18 MED ORDER — PROMETHAZINE HCL 25 MG PO TABS: 12.5000 mg | ORAL_TABLET | Freq: Four times a day (QID) | ORAL | 0 refills | Status: DC | PRN

## 2018-04-18 MED ORDER — NATACHEW 28-1 MG PO CHEW: 1.0000 | CHEWABLE_TABLET | Freq: Every day | ORAL | 11 refills | Status: DC

## 2018-04-18 NOTE — MAU Provider Note (Signed)
History     CSN: [id number]  Arrival date and time: 04/18/18 2458   First Provider Initiated Contact with Patient 04/18/18 1215      Chief Complaint  Patient presents with  . Vaginal Bleeding   Vaginal Bleeding  The patient's primary symptoms include vaginal bleeding. This is a new problem. The current episode started today (at 0800). The problem occurs constantly. The problem has been unchanged. Pain severity now: 8/10 Upper abdominal pain. Denies lower abdominal pain  The problem affects both sides. She is pregnant. The vaginal discharge was bloody. The vaginal bleeding is lighter than menses. She has not been passing clots. She has not been passing tissue. Nothing aggravates the symptoms. She has tried nothing for the symptoms. Her menstrual history has been irregular (LMP 03/09/18).    OB History    Gravida  7   Para  5   Term  3   Preterm  2   AB  1   Living  6     SAB  1   TAB  0   Ectopic  0   Multiple  1   Live Births  6           Past Medical History:  Diagnosis Date  . Abnormal Pap smear    LAST PAP 06/2011  . Anemia    CHRONIC  . Asthma    rarely uses inhaler,  [facility]  . Bipolar affective disorder ([place])   . Bipolar affective disorder ([place]) 2006   TOOK MEDS X 3 YR  . Depression    PP 2013  . Gout    feet - no meds  . Headache(784.0)    otc med prn  . Infection    Hx -UTI  . Mental disorder   . Missed ab 10/2013  . Sickle cell trait ([place])   . SVD (spontaneous vaginal delivery)    x 4  . Vaginal Pap smear, abnormal     Past Surgical History:  Procedure Laterality Date  . CESAREAN SECTION MULTI-GESTATIONAL N/A 09/19/2014   Procedure: CESAREAN SECTION MULTI-GESTATIONAL;  Surgeon: Shelly Bombard, MD;  Location: [facility];  Service: Obstetrics;  Laterality: N/A;  . DILATION AND CURETTAGE OF UTERUS    . DILATION AND EVACUATION N/A 11/04/2013   Procedure: DILATATION AND EVACUATION;  Surgeon: Alwyn Pea, MD;  Location: [facility];   Service: Gynecology;  Laterality: N/A;  . MOUTH SURGERY     wisdom teeth ext  . VAGINAL DELIVERY     x 4  . WISDOM TOOTH EXTRACTION  2012    Family History  Problem Relation Age of Onset  . Hypertension Mother   . Asthma Mother   . Heart disease Mother   . Hyperlipidemia Mother   . Thyroid disease Mother   . Alcohol abuse Father   . Cancer Maternal Grandmother 37       COLON  . Depression Maternal Grandmother   . Alcohol abuse Maternal Grandmother   . Drug abuse Maternal Grandmother   . Alcohol abuse Maternal Grandfather   . Drug abuse Maternal Grandfather   . Stroke Maternal Grandfather   . Alcohol abuse Paternal Grandfather   . Drug abuse Paternal Grandfather   . Asthma Sister   . Depression Sister   . Diabetes Maternal Aunt   . HIV Maternal Aunt   . Asthma Sister     Social History   Tobacco Use  . Smoking status: Never Smoker  . Smokeless tobacco: Never  Used  Substance Use Topics  . Alcohol use: No    Alcohol/week: 0.0 standard drinks  . Drug use: No    Allergies: No Known Allergies  No medications prior to admission.    Review of Systems  Genitourinary: Positive for vaginal bleeding.   Physical Exam   Blood pressure 114/66, pulse 67, temperature 98.2 F (36.8 C), temperature source Oral, height 5\' 2"  (1.575 m), weight 95.8 kg, last menstrual period 03/09/2018, SpO2 100 %.  Physical Exam  Nursing note and vitals reviewed. Constitutional: She is oriented to person, place, and time. She appears well-developed and well-nourished. No distress.  HENT:  Head: Normocephalic.  Cardiovascular: Normal rate.  Respiratory: Effort normal.  GI: Soft. There is no tenderness. There is no rebound.  Neurological: She is alert and oriented to person, place, and time.  Skin: Skin is warm and dry.  Psychiatric: She has a normal mood and affect.     Results for orders placed or performed during the hospital encounter of 04/18/18 (from the past 24 hour(s))   Urinalysis, Routine w reflex microscopic     Status: Abnormal   Collection Time: 04/18/18 10:05 AM  Result Value Ref Range   Color, Urine YELLOW YELLOW   APPearance HAZY (A) CLEAR   Specific Gravity, Urine 1.011 1.005 - 1.030   pH 7.0 5.0 - 8.0   Glucose, UA NEGATIVE NEGATIVE mg/dL   Hgb urine dipstick LARGE (A) NEGATIVE   Bilirubin Urine NEGATIVE NEGATIVE   Ketones, ur NEGATIVE NEGATIVE mg/dL   Protein, ur NEGATIVE NEGATIVE mg/dL   Nitrite NEGATIVE NEGATIVE   Leukocytes, UA NEGATIVE NEGATIVE   RBC / HPF 0-5 0 - 5 RBC/hpf   WBC, UA 0-5 0 - 5 WBC/hpf   Bacteria, UA NONE SEEN NONE SEEN   Squamous Epithelial / LPF 0-5 0 - 5  CBC     Status: Abnormal   Collection Time: 04/18/18 10:35 AM  Result Value Ref Range   WBC 6.1 4.0 - 10.5 K/uL   RBC 4.16 3.87 - 5.11 MIL/uL   Hemoglobin 11.4 (L) 12.0 - 15.0 g/dL   HCT 34.4 (L) 36.0 - 46.0 %   MCV 82.7 78.0 - 100.0 fL   MCH 27.4 26.0 - 34.0 pg   MCHC 33.1 30.0 - 36.0 g/dL   RDW 13.8 11.5 - 15.5 %   Platelets 252 150 - 400 K/uL  hCG, quantitative, pregnancy     Status: Abnormal   Collection Time: 04/18/18 10:35 AM  Result Value Ref Range   hCG, Beta Chain, Quant, S 22,619 (H) <5 mIU/mL   US Ob Transvaginal  Result Date: 04/18/2018 CLINICAL DATA:  Vaginal bleeding. Five weeks and 5 days pregnant by last menstrual period. EXAM: TRANSVAGINAL OB ULTRASOUND TECHNIQUE: Transvaginal ultrasound was performed for complete evaluation of the gestation as well as the maternal uterus, adnexal regions, and pelvic cul-de-sac. COMPARISON:  04/12/2018. FINDINGS: Intrauterine gestational sac: Visualized Yolk sac:  Visualized Embryo:  Not visualized MSD: 14.2 mm   6 w   2 d Subchorionic hemorrhage:  Small Maternal uterus/adnexae: Again demonstrated is a large simple right ovarian cyst measuring 7.8 cm in maximum diameter, previously 7.0 cm. Normal appearing left ovary. 1.6 cm rounded posterior uterine mass compatible with a fibroid. No free peritoneal fluid.  IMPRESSION: 1. Single intrauterine gestational sac containing an interval normal appearing yolk sac with no visible fetal pole at this time. Recommend follow-up quantitative B-HCG levels and follow-up US in 14 days to assess viability. This recommendation follows  SRU consensus guidelines: Diagnostic Criteria for Nonviable Pregnancy Early in the First Trimester. [employer] Med 2013; 026:3785-88. 2. Small subchorionic hemorrhage. 3. 7.8 cm simple right ovarian cyst. 4. 1.6 cm uterine fibroid without a submucosal component. Electronically Signed   By: Claudie Revering M.D.   On: 04/18/2018 11:48   MAU Course  Procedures  MDM HCG has continued to increase Yolk sac now visible on [place] Reassured patient of pregnancy progression at this time, and ok to start prenatal care. Warning signs reviewed   Assessment and Plan   1. Vaginal bleeding in pregnancy, first trimester   2. [redacted] weeks gestation of pregnancy   3. Nausea and vomiting in pregnancy    DC home Comfort measures reviewed  1st Trimester precautions  Bleeding precautionss RX: none  Return to MAU as needed FU with OB as planned  Follow-up Information    Department, [facility] Follow up.   Contact information: [place] [address] [phone]            Marcille Buffy 04/18/2018, 12:16 PM

## 2018-04-18 NOTE — Discharge Instructions (Signed)
Prenatal Care Providers [facility]  [phone]     Phone: [place]                      [facility]  @[place]     Phone: [phone]  Phone: [place]     Phone: [phone]  Phone: [facility] [place]                hone: [phone]  Phone: [phone]         [facility] [address]      Phone: [phone]  Phone: [phone]         [place] [facility]                Phone: [phone]  [place])          Phone: [phone] [facility]   Phone: [phone]

## 2018-04-18 NOTE — MAU Note (Signed)
Pt presents with c/o VB that began 0800 this morning.  Reports upper abdominal pain.  Denies recent coitus.

## 2018-04-22 ENCOUNTER — Ambulatory Visit (HOSPITAL_COMMUNITY)
Admission: RE | Admit: 2018-04-22 | Discharge: 2018-04-22 | Disposition: A | Discharge status: Home / Self Care | Payer: Medicaid Other | Source: Ambulatory Visit | Attending: Obstetrics & Gynecology | Admitting: Obstetrics & Gynecology

## 2018-04-22 ENCOUNTER — Ambulatory Visit (INDEPENDENT_AMBULATORY_CARE_PROVIDER_SITE_OTHER): Payer: Medicaid Other

## 2018-04-22 DIAGNOSIS — O283 Abnormal ultrasonic finding on antenatal screening of mother: Secondary | ICD-10-CM | POA: Diagnosis present

## 2018-04-22 DIAGNOSIS — O3680X0 Pregnancy with inconclusive fetal viability, not applicable or unspecified: Secondary | ICD-10-CM

## 2018-04-22 DIAGNOSIS — Z3A01 Less than 8 weeks gestation of pregnancy: Secondary | ICD-10-CM | POA: Insufficient documentation

## 2018-04-22 DIAGNOSIS — R110 Nausea: Secondary | ICD-10-CM

## 2018-04-22 MED ORDER — DOXYLAMINE-PYRIDOXINE 10-10 MG PO TBEC: DELAYED_RELEASE_TABLET | ORAL | 4 refills | Status: DC

## 2018-04-22 MED ORDER — FLINTSTONES COMPLETE 60 MG PO CHEW: 1.0000 | CHEWABLE_TABLET | Freq: Every day | ORAL | 12 refills | Status: DC

## 2018-04-22 NOTE — Progress Notes (Signed)
Pt here for [place] results:  FINDINGS: Intrauterine gestational sac: Single  Yolk sac:  Visualized.  Embryo:  Visualized.  Cardiac Activity: Visualized.  Heart Rate: 103 bpm  CRL: 2.35  mm   5 w 5 d +/- 3 days                [place] EDC: 12/18/2018  Subchorionic hemorrhage:  None visualized.  Maternal uterus/adnexae: Unchanged 1.6 cm intramural fibroid within the posterior mid uterine body. Unchanged large 7.9 cm simple cyst in the right ovary. The left ovary is unremarkable.  IMPRESSION: 1. Single, live intrauterine pregnancy with estimated gestational age of [redacted] weeks, 5 days.  Pt also stated medicaid did not cover for prenatal vitamins that was sent to her pharmacy & the phenergan did not work. Talked w/ Provider & they suggested diclegis or acupuncture band or Ginger chews.Advised pt will resend Rx order. Pt verbalized understanding.

## 2018-04-27 ENCOUNTER — Telehealth: Payer: Self-pay

## 2018-04-27 NOTE — Telephone Encounter (Signed)
Pt called stating that she has been to the hospital a few times so far this pregnancy and been given Diclegis and Phenergan for her nausea and it is not working. Pt states she has only been on the diclegis for about a week. I did advise pt that this could take a couple weeks for this medication to be fully effective. Pt would like to know if there is anything else we can give her for the nausea. She denies vomiting and is able to keep fluids and food down. I advised pt that I would send one of the providers a message, pt verbalized understanding.

## 2018-05-11 ENCOUNTER — Other Ambulatory Visit: Payer: Self-pay | Admitting: Obstetrics

## 2018-05-11 ENCOUNTER — Other Ambulatory Visit: Payer: Self-pay

## 2018-05-11 DIAGNOSIS — O219 Vomiting of pregnancy, unspecified: Secondary | ICD-10-CM

## 2018-05-11 MED ORDER — METOCLOPRAMIDE HCL 10 MG PO TABS: 10.0000 mg | ORAL_TABLET | Freq: Three times a day (TID) | ORAL | 4 refills | Status: DC

## 2018-05-11 NOTE — Telephone Encounter (Signed)
Pt would like refill on Promethazine 25mg .  [facility] on [address]  Please send if approved.

## 2018-05-12 ENCOUNTER — Telehealth: Payer: Self-pay

## 2018-05-12 NOTE — Telephone Encounter (Signed)
Incoming call, advised patient that provider sent rx for nausea.

## 2018-05-20 ENCOUNTER — Encounter: Payer: Self-pay | Admitting: Obstetrics and Gynecology

## 2018-05-20 ENCOUNTER — Other Ambulatory Visit (HOSPITAL_COMMUNITY)
Admission: RE | Admit: 2018-05-20 | Discharge: 2018-05-20 | Disposition: A | Discharge status: Home / Self Care | Payer: Medicaid Other | Source: Ambulatory Visit | Attending: Obstetrics and Gynecology | Admitting: Obstetrics and Gynecology

## 2018-05-20 ENCOUNTER — Ambulatory Visit (INDEPENDENT_AMBULATORY_CARE_PROVIDER_SITE_OTHER): Payer: Medicaid Other | Admitting: Obstetrics and Gynecology

## 2018-05-20 DIAGNOSIS — O09219 Supervision of pregnancy with history of pre-term labor, unspecified trimester: Secondary | ICD-10-CM

## 2018-05-20 DIAGNOSIS — Z3A10 10 weeks gestation of pregnancy: Secondary | ICD-10-CM | POA: Diagnosis not present

## 2018-05-20 DIAGNOSIS — Z3480 Encounter for supervision of other normal pregnancy, unspecified trimester: Secondary | ICD-10-CM | POA: Insufficient documentation

## 2018-05-20 DIAGNOSIS — Z3481 Encounter for supervision of other normal pregnancy, first trimester: Secondary | ICD-10-CM | POA: Diagnosis present

## 2018-05-20 DIAGNOSIS — O09211 Supervision of pregnancy with history of pre-term labor, first trimester: Secondary | ICD-10-CM

## 2018-05-20 DIAGNOSIS — O34219 Maternal care for unspecified type scar from previous cesarean delivery: Secondary | ICD-10-CM

## 2018-05-20 MED ORDER — VITAFOL GUMMIES 3.33-0.333-34.8 MG PO CHEW: 2.0000 | CHEWABLE_TABLET | Freq: Every day | ORAL | 6 refills | Status: DC

## 2018-05-20 MED ORDER — ONDANSETRON 4 MG PO TBDP: 4.0000 mg | ORAL_TABLET | Freq: Four times a day (QID) | ORAL | 0 refills | Status: DC | PRN

## 2018-05-20 NOTE — Progress Notes (Signed)
Patient is in the office for New OB visit. Pt complains of nausea despite taking rx, also requests rx for prenatal gummies. Pt states she has had light cramping and had some spotting yesterday morning. Pt is unsure of the FOB buts states that she does not want either one of the potentials to be involved. Pt's mom and dad are her support system.

## 2018-05-20 NOTE — Progress Notes (Signed)
Subjective:    Lindsay Ramirez is a [id number] [redacted]w[redacted]d being seen today for her first obstetrical visit.  Her obstetrical history is significant for previous cesarean section due to twin malpresentaion, previous preterm delivery at 36.6. Patient does not intend to breast feed. Pregnancy history fully reviewed.  Patient reports nausea and vomiting.  Vitals:   05/20/18 1005  BP: 110/76  Pulse: 70  Weight: 213 lb 1.6 oz (96.7 kg)    HISTORY: OB History  Gravida Para Term Preterm AB Living  7 5 3 2 1 6   SAB TAB Ectopic Multiple Live Births  1 0 0 1 6    # Outcome Date GA Lbr Len/2nd Weight Sex Delivery Anes PTL Lv  7 Current           6A Preterm 09/19/14 [redacted]w[redacted]d  5 lb 0.8 oz (2.29 kg) F CS-LTranv Spinal  LIV  6B Preterm 09/19/14 [redacted]w[redacted]d  4 lb 11.8 oz (2.15 kg) M CS-LTranv Spinal  LIV  5 SAB 2015          4 Preterm 12/12/12 [redacted]w[redacted]d 10:18 / 00:06 5 lb 11.9 oz (2.605 kg) M Vag-Spont EPI  LIV     Birth Comments: none  3 Term 02/14/12 [redacted]w[redacted]d 07:40 / 00:18 6 lb 14.1 oz (3.12 kg) M Vag-Spont EPI  LIV     Birth Comments: WNL  2 Term 03/2011 [redacted]w[redacted]d 06:00 7 lb 3 oz (3.26 kg) F Vag-Spont EPI  LIV  1 Term 07/2006 [redacted]w[redacted]d 08:00 7 lb 6 oz (3.345 kg) F Vag-Spont EPI  LIV   Past Medical History:  Diagnosis Date  . Abnormal Pap smear    LAST PAP 06/2011  . Anemia    CHRONIC  . Asthma    rarely uses inhaler,  [facility]  . Bipolar affective disorder ([place])   . Bipolar affective disorder ([place]) 2006   TOOK MEDS X 3 YR  . Depression    PP 2013  . Gout    feet - no meds  . Headache(784.0)    otc med prn  . Infection    Hx -UTI  . Mental disorder   . Missed ab 10/2013  . Sickle cell trait ([place])   . SVD (spontaneous vaginal delivery)    x 4  . Vaginal Pap smear, abnormal    Past Surgical History:  Procedure Laterality Date  . CESAREAN SECTION MULTI-GESTATIONAL N/A 09/19/2014   Procedure: CESAREAN SECTION MULTI-GESTATIONAL;  Surgeon: Shelly Bombard, MD;  Location: [facility];  Service: Obstetrics;   Laterality: N/A;  . DILATION AND CURETTAGE OF UTERUS    . DILATION AND EVACUATION N/A 11/04/2013   Procedure: DILATATION AND EVACUATION;  Surgeon: Alwyn Pea, MD;  Location: [facility];  Service: Gynecology;  Laterality: N/A;  . MOUTH SURGERY     wisdom teeth ext  . VAGINAL DELIVERY     x 4  . WISDOM TOOTH EXTRACTION  2012   Family History  Problem Relation Age of Onset  . Hypertension Mother   . Asthma Mother   . Heart disease Mother   . Hyperlipidemia Mother   . Thyroid disease Mother   . Alcohol abuse Father   . Cancer Maternal Grandmother 37       COLON  . Depression Maternal Grandmother   . Alcohol abuse Maternal Grandmother   . Drug abuse Maternal Grandmother   . Alcohol abuse Maternal Grandfather   . Drug abuse Maternal Grandfather   . Stroke Maternal Grandfather   . Alcohol  abuse Paternal Grandfather   . Drug abuse Paternal Grandfather   . Asthma Sister   . Depression Sister   . Diabetes Maternal Aunt   . HIV Maternal Aunt   . Asthma Sister      Exam    Uterus:   10-weeks  Pelvic Exam:    Perineum: No Hemorrhoids, Normal Perineum   Vulva: normal   Vagina:  normal mucosa, normal discharge   pH:    Cervix: multiparous appearance and cervix is closed and long   Adnexa: normal adnexa and no mass, fullness, tenderness   Bony Pelvis: gynecoid  System: Breast:  normal appearance, no masses or tenderness   Skin: normal coloration and turgor, no rashes    Neurologic: oriented, no focal deficits   Extremities: normal strength, tone, and muscle mass   HEENT extra ocular movement intact   Mouth/Teeth mucous membranes moist, pharynx normal without lesions and dental hygiene good   Neck supple   Cardiovascular: regular rate and rhythm   Respiratory:  appears well, vitals normal, no respiratory distress, acyanotic, normal RR, chest clear, no wheezing, crepitations, rhonchi, normal symmetric air entry   Abdomen: soft, non-tender; bowel sounds normal; no masses,  no  organomegaly   Urinary:       Assessment:    Pregnancy: [id number] Patient Active Problem List   Diagnosis Date Noted  . Supervision of other normal pregnancy, antepartum 05/20/2018  . Previous preterm delivery, antepartum 05/20/2018  . Previous cesarean delivery, antepartum 09/19/2014  . Migraine without aura 03/25/2014  . Gout 06/15/2013  . Bipolar disorder, unspecified/depression 11/11/2012  . 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 Acid reflux 06/21/2012        Plan:     Initial labs drawn. Prenatal vitamins. Problem list reviewed and updated. Genetic Screening discussed : Panorama ordered.  Ultrasound discussed; fetal survey: requested. Patient very miserable with nausea and emesis. She reports that diclegis, phenergan and reglan are not helping. She is requesting a trial of zofran. Also discussed MAU evaluation Will check TSH Patient declined 17-P  Follow up in 4 weeks. 50% of 30 min visit spent on counseling and coordination of care.     Tarrie Mcmichen 05/20/2018

## 2018-05-20 NOTE — Patient Instructions (Signed)
First Trimester of Pregnancy The first trimester of pregnancy is from week 1 until the end of week 13 (months 1 through 3). A week after a sperm fertilizes an egg, the egg will implant on the wall of the uterus. This embryo will begin to develop into a baby. Genes from you and your partner will form the baby. The female genes will determine whether the baby will be a boy or a girl. At 6-8 weeks, the eyes and face will be formed, and the heartbeat can be seen on ultrasound. At the end of 12 weeks, all the baby's organs will be formed. Now that you are pregnant, you will want to do everything you can to have a healthy baby. Two of the most important things are to get good prenatal care and to follow your health care provider's instructions. Prenatal care is all the medical care you receive before the baby's birth. This care will help prevent, find, and treat any problems during the pregnancy and childbirth. Body changes during your first trimester Your body goes through many changes during pregnancy. The changes vary from woman to woman.  You may gain or lose a couple of pounds at first.  You may feel sick to your stomach (nauseous) and you may throw up (vomit). If the vomiting is uncontrollable, call your health care provider.  You may tire easily.  You may develop headaches that can be relieved by medicines. All medicines should be approved by your health care provider.  You may urinate more often. Painful urination may mean you have a bladder infection.  You may develop heartburn as a result of your pregnancy.  You may develop constipation because certain hormones are causing the muscles that push stool through your intestines to slow down.  You may develop hemorrhoids or swollen veins (varicose veins).  Your breasts may begin to grow larger and become tender. Your nipples may stick out more, and the tissue that surrounds them (areola) may become darker.  Your gums may bleed and may be  sensitive to brushing and flossing.  Dark spots or blotches (chloasma, mask of pregnancy) may develop on your face. This will likely fade after the baby is born.  Your menstrual periods will stop.  You may have a loss of appetite.  You may develop cravings for certain kinds of food.  You may have changes in your emotions from day to day, such as being excited to be pregnant or being concerned that something may go wrong with the pregnancy and baby.  You may have more vivid and strange dreams.  You may have changes in your hair. These can include thickening of your hair, rapid growth, and changes in texture. Some women also have hair loss during or after pregnancy, or hair that feels dry or thin. Your hair will most likely return to normal after your baby is born.  What to expect at prenatal visits During a routine prenatal visit:  You will be weighed to make sure you and the baby are growing normally.  Your blood pressure will be taken.  Your abdomen will be measured to track your baby's growth.  The fetal heartbeat will be listened to between weeks 10 and 14 of your pregnancy.  Test results from any previous visits will be discussed.  Your health care provider may ask you:  How you are feeling.  If you are feeling the baby move.  If you have had any abnormal symptoms, such as leaking fluid, bleeding, severe  headaches, or abdominal cramping.  If you are using any tobacco products, including cigarettes, chewing tobacco, and electronic cigarettes.  If you have any questions.  Other tests that may be performed during your first trimester include:  Blood tests to find your blood type and to check for the presence of any previous infections. The tests will also be used to check for low iron levels (anemia) and protein on red blood cells (Rh antibodies). Depending on your risk factors, or if you previously had diabetes during pregnancy, you may have tests to check for high blood  sugar that affects pregnant women (gestational diabetes).  Urine tests to check for infections, diabetes, or protein in the urine.  An ultrasound to confirm the proper growth and development of the baby.  Fetal screens for spinal cord problems (spina bifida) and Down syndrome.  HIV (human immunodeficiency virus) testing. Routine prenatal testing includes screening for HIV, unless you choose not to have this test.  You may need other tests to make sure you and the baby are doing well.  Follow these instructions at home: Medicines  Follow your health care provider's instructions regarding medicine use. Specific medicines may be either safe or unsafe to take during pregnancy.  Take a prenatal vitamin that contains at least 600 micrograms (mcg) of folic acid.  If you develop constipation, try taking a stool softener if your health care provider approves. Eating and drinking  Eat a balanced diet that includes fresh fruits and vegetables, whole grains, good sources of protein such as meat, eggs, or tofu, and low-fat dairy. Your health care provider will help you determine the amount of weight gain that is right for you.  Avoid raw meat and uncooked cheese. These carry germs that can cause birth defects in the baby.  Eating four or five small meals rather than three large meals a day may help relieve nausea and vomiting. If you start to feel nauseous, eating a few soda crackers can be helpful. Drinking liquids between meals, instead of during meals, also seems to help ease nausea and vomiting.  Limit foods that are high in fat and processed sugars, such as fried and sweet foods.  To prevent constipation: ? Eat foods that are high in fiber, such as fresh fruits and vegetables, whole grains, and beans. ? Drink enough fluid to keep your urine clear or pale yellow. Activity  Exercise only as directed by your health care provider. Most women can continue their usual exercise routine during  pregnancy. Try to exercise for 30 minutes at least 5 days a week. Exercising will help you: ? Control your weight. ? Stay in shape. ? Be prepared for labor and delivery.  Experiencing pain or cramping in the lower abdomen or lower back is a good sign that you should stop exercising. Check with your health care provider before continuing with normal exercises.  Try to avoid standing for long periods of time. Move your legs often if you must stand in one place for a long time.  Avoid heavy lifting.  Wear low-heeled shoes and practice good posture.  You may continue to have sex unless your health care provider tells you not to. Relieving pain and discomfort  Wear a good support bra to relieve breast tenderness.  Take warm sitz baths to soothe any pain or discomfort caused by hemorrhoids. Use hemorrhoid cream if your health care provider approves.  Rest with your legs elevated if you have leg cramps or low back pain.  If you  develop varicose veins in your legs, wear support hose. Elevate your feet for 15 minutes, 3-4 times a day. Limit salt in your diet. Prenatal care  Schedule your prenatal visits by the twelfth week of pregnancy. They are usually scheduled monthly at first, then more often in the last 2 months before delivery.  Write down your questions. Take them to your prenatal visits.  Keep all your prenatal visits as told by your health care provider. This is important. Safety  Wear your seat belt at all times when driving.  Make a list of emergency phone numbers, including numbers for family, friends, the hospital, and police and fire departments. General instructions  Ask your health care provider for a referral to a local prenatal education class. Begin classes no later than the beginning of month 6 of your pregnancy.  Ask for help if you have counseling or nutritional needs during pregnancy. Your health care provider can offer advice or refer you to specialists for help  with various needs.  Do not use hot tubs, steam rooms, or saunas.  Do not douche or use tampons or scented sanitary pads.  Do not cross your legs for long periods of time.  Avoid cat litter boxes and soil used by cats. These carry germs that can cause birth defects in the baby and possibly loss of the fetus by miscarriage or stillbirth.  Avoid all smoking, herbs, alcohol, and medicines not prescribed by your health care provider. Chemicals in these products affect the formation and growth of the baby.  Do not use any products that contain nicotine or tobacco, such as cigarettes and e-cigarettes. If you need help quitting, ask your health care provider. You may receive counseling support and other resources to help you quit.  Schedule a dentist appointment. At home, brush your teeth with a soft toothbrush and be gentle when you floss. Contact a health care provider if:  You have dizziness.  You have mild pelvic cramps, pelvic pressure, or nagging pain in the abdominal area.  You have persistent nausea, vomiting, or diarrhea.  You have a bad smelling vaginal discharge.  You have pain when you urinate.  You notice increased swelling in your face, hands, legs, or ankles.  You are exposed to fifth disease or chickenpox.  You are exposed to German measles (rubella) and have never had it. Get help right away if:  You have a fever.  You are leaking fluid from your vagina.  You have spotting or bleeding from your vagina.  You have severe abdominal cramping or pain.  You have rapid weight gain or loss.  You vomit blood or material that looks like coffee grounds.  You develop a severe headache.  You have shortness of breath.  You have any kind of trauma, such as from a fall or a car accident. Summary  The first trimester of pregnancy is from week 1 until the end of week 13 (months 1 through 3).  Your body goes through many changes during pregnancy. The changes vary from  woman to woman.  You will have routine prenatal visits. During those visits, your health care provider will examine you, discuss any test results you may have, and talk with you about how you are feeling. This information is not intended to replace advice given to you by your health care provider. Make sure you discuss any questions you have with your health care provider. Document Released: 08/19/2001 Document Revised: 08/06/2016 Document Reviewed: 08/06/2016 [employer] Interactive Patient Education  2018   [employer]   Second Trimester of Pregnancy The second trimester is from week 14 through week 27 (months 4 through 6). The second trimester is often a time when you feel your best. Your body has adjusted to being pregnant, and you begin to feel better physically. Usually, morning sickness has lessened or quit completely, you may have more energy, and you may have an increase in appetite. The second trimester is also a time when the fetus is growing rapidly. At the end of the sixth month, the fetus is about 9 inches long and weighs about 1 pounds. You will likely begin to feel the baby move (quickening) between 16 and 20 weeks of pregnancy. Body changes during your second trimester Your body continues to go through many changes during your second trimester. The changes vary from woman to woman.  Your weight will continue to increase. You will notice your lower abdomen bulging out.  You may begin to get stretch marks on your hips, abdomen, and breasts.  You may develop headaches that can be relieved by medicines. The medicines should be approved by your health care provider.  You may urinate more often because the fetus is pressing on your bladder.  You may develop or continue to have heartburn as a result of your pregnancy.  You may develop constipation because certain hormones are causing the muscles that push waste through your intestines to slow down.  You may develop hemorrhoids or  swollen, bulging veins (varicose veins).  You may have back pain. This is caused by: ? Weight gain. ? Pregnancy hormones that are relaxing the joints in your pelvis. ? A shift in weight and the muscles that support your balance.  Your breasts will continue to grow and they will continue to become tender.  Your gums may bleed and may be sensitive to brushing and flossing.  Dark spots or blotches (chloasma, mask of pregnancy) may develop on your face. This will likely fade after the baby is born.  A dark line from your belly button to the pubic area (linea nigra) may appear. This will likely fade after the baby is born.  You may have changes in your hair. These can include thickening of your hair, rapid growth, and changes in texture. Some women also have hair loss during or after pregnancy, or hair that feels dry or thin. Your hair will most likely return to normal after your baby is born.  What to expect at prenatal visits During a routine prenatal visit:  You will be weighed to make sure you and the fetus are growing normally.  Your blood pressure will be taken.  Your abdomen will be measured to track your baby's growth.  The fetal heartbeat will be listened to.  Any test results from the previous visit will be discussed.  Your health care provider may ask you:  How you are feeling.  If you are feeling the baby move.  If you have had any abnormal symptoms, such as leaking fluid, bleeding, severe headaches, or abdominal cramping.  If you are using any tobacco products, including cigarettes, chewing tobacco, and electronic cigarettes.  If you have any questions.  Other tests that may be performed during your second trimester include:  Blood tests that check for: ? Low iron levels (anemia). ? High blood sugar that affects pregnant women (gestational diabetes) between 24 and 28 weeks. ? Rh antibodies. This is to check for a protein on red blood cells (Rh factor).  Urine  tests to   check for infections, diabetes, or protein in the urine.  An ultrasound to confirm the proper growth and development of the baby.  An amniocentesis to check for possible genetic problems.  Fetal screens for spina bifida and Down syndrome.  HIV (human immunodeficiency virus) testing. Routine prenatal testing includes screening for HIV, unless you choose not to have this test.  Follow these instructions at home: Medicines  Follow your health care provider's instructions regarding medicine use. Specific medicines may be either safe or unsafe to take during pregnancy.  Take a prenatal vitamin that contains at least 600 micrograms (mcg) of folic acid.  If you develop constipation, try taking a stool softener if your health care provider approves. Eating and drinking  Eat a balanced diet that includes fresh fruits and vegetables, whole grains, good sources of protein such as meat, eggs, or tofu, and low-fat dairy. Your health care provider will help you determine the amount of weight gain that is right for you.  Avoid raw meat and uncooked cheese. These carry germs that can cause birth defects in the baby.  If you have low calcium intake from food, talk to your health care provider about whether you should take a daily calcium supplement.  Limit foods that are high in fat and processed sugars, such as fried and sweet foods.  To prevent constipation: ? Drink enough fluid to keep your urine clear or pale yellow. ? Eat foods that are high in fiber, such as fresh fruits and vegetables, whole grains, and beans. Activity  Exercise only as directed by your health care provider. Most women can continue their usual exercise routine during pregnancy. Try to exercise for 30 minutes at least 5 days a week. Stop exercising if you experience uterine contractions.  Avoid heavy lifting, wear low heel shoes, and practice good posture.  A sexual relationship may be continued unless your health  care provider directs you otherwise. Relieving pain and discomfort  Wear a good support bra to prevent discomfort from breast tenderness.  Take warm sitz baths to soothe any pain or discomfort caused by hemorrhoids. Use hemorrhoid cream if your health care provider approves.  Rest with your legs elevated if you have leg cramps or low back pain.  If you develop varicose veins, wear support hose. Elevate your feet for 15 minutes, 3-4 times a day. Limit salt in your diet. Prenatal Care  Write down your questions. Take them to your prenatal visits.  Keep all your prenatal visits as told by your health care provider. This is important. Safety  Wear your seat belt at all times when driving.  Make a list of emergency phone numbers, including numbers for family, friends, the hospital, and police and fire departments. General instructions  Ask your health care provider for a referral to a local prenatal education class. Begin classes no later than the beginning of month 6 of your pregnancy.  Ask for help if you have counseling or nutritional needs during pregnancy. Your health care provider can offer advice or refer you to specialists for help with various needs.  Do not use hot tubs, steam rooms, or saunas.  Do not douche or use tampons or scented sanitary pads.  Do not cross your legs for long periods of time.  Avoid cat litter boxes and soil used by cats. These carry germs that can cause birth defects in the baby and possibly loss of the fetus by miscarriage or stillbirth.  Avoid all smoking, herbs, alcohol, and unprescribed drugs. Chemicals  in these products can affect the formation and growth of the baby.  Do not use any products that contain nicotine or tobacco, such as cigarettes and e-cigarettes. If you need help quitting, ask your health care provider.  Visit your dentist if you have not gone yet during your pregnancy. Use a soft toothbrush to brush your teeth and be gentle when  you floss. Contact a health care provider if:  You have dizziness.  You have mild pelvic cramps, pelvic pressure, or nagging pain in the abdominal area.  You have persistent nausea, vomiting, or diarrhea.  You have a bad smelling vaginal discharge.  You have pain when you urinate. Get help right away if:  You have a fever.  You are leaking fluid from your vagina.  You have spotting or bleeding from your vagina.  You have severe abdominal cramping or pain.  You have rapid weight gain or weight loss.  You have shortness of breath with chest pain.  You notice sudden or extreme swelling of your face, hands, ankles, feet, or legs.  You have not felt your baby move in over an hour.  You have severe headaches that do not go away when you take medicine.  You have vision changes. Summary  The second trimester is from week 14 through week 27 (months 4 through 6). It is also a time when the fetus is growing rapidly.  Your body goes through many changes during pregnancy. The changes vary from woman to woman.  Avoid all smoking, herbs, alcohol, and unprescribed drugs. These chemicals affect the formation and growth your baby.  Do not use any tobacco products, such as cigarettes, chewing tobacco, and e-cigarettes. If you need help quitting, ask your health care provider.  Contact your health care provider if you have any questions. Keep all prenatal visits as told by your health care provider. This is important. This information is not intended to replace advice given to you by your health care provider. Make sure you discuss any questions you have with your health care provider. Document Released: 08/19/2001 Document Revised: 09/30/2016 Document Reviewed: 09/30/2016 [employer] Interactive Patient Education  2018 [employer]   Contraception Choices Contraception, also called birth control, refers to methods or devices that prevent pregnancy. Hormonal methods Contraceptive  implant A contraceptive implant is a thin, plastic tube that contains a hormone. It is inserted into the upper part of the arm. It can remain in place for up to 3 years. Progestin-only injections Progestin-only injections are injections of progestin, a synthetic form of the hormone progesterone. They are given every 3 months by a health care provider. Birth control pills Birth control pills are pills that contain hormones that prevent pregnancy. They must be taken once a day, preferably at the same time each day. Birth control patch The birth control patch contains hormones that prevent pregnancy. It is placed on the skin and must be changed once a week for three weeks and removed on the fourth week. A prescription is needed to use this method of contraception. Vaginal ring A vaginal ring contains hormones that prevent pregnancy. It is placed in the vagina for three weeks and removed on the fourth week. After that, the process is repeated with a new ring. A prescription is needed to use this method of contraception. Emergency contraceptive Emergency contraceptives prevent pregnancy after unprotected sex. They come in pill form and can be taken up to 5 days after sex. They work best the sooner they are taken after having   sex. Most emergency contraceptives are available without a prescription. This method should not be used as your only form of birth control. Barrier methods Female condom A female condom is a thin sheath that is worn over the penis during sex. Condoms keep sperm from going inside a woman's body. They can be used with a spermicide to increase their effectiveness. They should be disposed after a single use. Female condom A female condom is a soft, loose-fitting sheath that is put into the vagina before sex. The condom keeps sperm from going inside a woman's body. They should be disposed after a single use. Diaphragm A diaphragm is a soft, dome-shaped barrier. It is inserted into the vagina  before sex, along with a spermicide. The diaphragm blocks sperm from entering the uterus, and the spermicide kills sperm. A diaphragm should be left in the vagina for 6-8 hours after sex and removed within 24 hours. A diaphragm is prescribed and fitted by a health care provider. A diaphragm should be replaced every 1-2 years, after giving birth, after gaining more than 15 lb (6.8 kg), and after pelvic surgery. Cervical cap A cervical cap is a round, soft latex or plastic cup that fits over the cervix. It is inserted into the vagina before sex, along with spermicide. It blocks sperm from entering the uterus. The cap should be left in place for 6-8 hours after sex and removed within 48 hours. A cervical cap must be prescribed and fitted by a health care provider. It should be replaced every 2 years. Sponge A sponge is a soft, circular piece of polyurethane foam with spermicide on it. The sponge helps block sperm from entering the uterus, and the spermicide kills sperm. To use it, you make it wet and then insert it into the vagina. It should be inserted before sex, left in for at least 6 hours after sex, and removed and thrown away within 30 hours. Spermicides Spermicides are chemicals that kill or block sperm from entering the cervix and uterus. They can come as a cream, jelly, suppository, foam, or tablet. A spermicide should be inserted into the vagina with an applicator at least 10-15 minutes before sex to allow time for it to work. The process must be repeated every time you have sex. Spermicides do not require a prescription. Intrauterine contraception Intrauterine device (IUD) An IUD is a T-shaped device that is put in a woman's uterus. There are two types:  Hormone IUD.This type contains progestin, a synthetic form of the hormone progesterone. This type can stay in place for 3-5 years.  Copper IUD.This type is wrapped in copper wire. It can stay in place for 10 years.  Permanent methods of  contraception Female tubal ligation In this method, a woman's fallopian tubes are sealed, tied, or blocked during surgery to prevent eggs from traveling to the uterus. Hysteroscopic sterilization In this method, a small, flexible insert is placed into each fallopian tube. The inserts cause scar tissue to form in the fallopian tubes and block them, so sperm cannot reach an egg. The procedure takes about 3 months to be effective. Another form of birth control must be used during those 3 months. Female sterilization This is a procedure to tie off the tubes that carry sperm (vasectomy). After the procedure, the man can still ejaculate fluid (semen). Natural planning methods Natural family planning In this method, a couple does not have sex on days when the woman could become pregnant. Calendar method This means keeping track of the   length of each menstrual cycle, identifying the days when pregnancy can happen, and not having sex on those days. Ovulation method In this method, a couple avoids sex during ovulation. Symptothermal method This method involves not having sex during ovulation. The woman typically checks for ovulation by watching changes in her temperature and in the consistency of cervical mucus. Post-ovulation method In this method, a couple waits to have sex until after ovulation. Summary  Contraception, also called birth control, means methods or devices that prevent pregnancy.  Hormonal methods of contraception include implants, injections, pills, patches, vaginal rings, and emergency contraceptives.  Barrier methods of contraception can include female condoms, female condoms, diaphragms, cervical caps, sponges, and spermicides.  There are two types of IUDs (intrauterine devices). An IUD can be put in a woman's uterus to prevent pregnancy for 3-5 years.  Permanent sterilization can be done through a procedure for males, females, or both.  Natural family planning methods involve  not having sex on days when the woman could become pregnant. This information is not intended to replace advice given to you by your health care provider. Make sure you discuss any questions you have with your health care provider. Document Released: 08/25/2005 Document Revised: 09/27/2016 Document Reviewed: 09/27/2016 Elsevier Interactive Patient Education  2018 [employer]   Breastfeeding Choosing to breastfeed is one of the best decisions you can make for yourself and your baby. A change in hormones during pregnancy causes your breasts to make breast milk in your milk-producing glands. Hormones prevent breast milk from being released before your baby is born. They also prompt milk flow after birth. Once breastfeeding has begun, thoughts of your baby, as well as his or her sucking or crying, can stimulate the release of milk from your milk-producing glands. Benefits of breastfeeding Research shows that breastfeeding offers many health benefits for infants and mothers. It also offers a cost-free and convenient way to feed your baby. For your baby  Your first milk (colostrum) helps your baby's digestive system to function better.  Special cells in your milk (antibodies) help your baby to fight off infections.  Breastfed babies are less likely to develop asthma, allergies, obesity, or type 2 diabetes. They are also at lower risk for sudden infant death syndrome (SIDS).  Nutrients in breast milk are better able to meet your baby's needs compared to infant formula.  Breast milk improves your baby's brain development. For you  Breastfeeding helps to create a very special bond between you and your baby.  Breastfeeding is convenient. Breast milk costs nothing and is always available at the correct temperature.  Breastfeeding helps to burn calories. It helps you to lose the weight that you gained during pregnancy.  Breastfeeding makes your uterus return faster to its size before pregnancy.  It also slows bleeding (lochia) after you give birth.  Breastfeeding helps to lower your risk of developing type 2 diabetes, osteoporosis, rheumatoid arthritis, cardiovascular disease, and breast, ovarian, uterine, and endometrial cancer later in life. Breastfeeding basics Starting breastfeeding  Find a comfortable place to sit or lie down, with your neck and back well-supported.  Place a pillow or a rolled-up blanket under your baby to bring him or her to the level of your breast (if you are seated). Nursing pillows are specially designed to help support your arms and your baby while you breastfeed.  Make sure that your baby's tummy (abdomen) is facing your abdomen.  Gently massage your breast. With your fingertips, massage from the outer edges of   your breast inward toward the nipple. This encourages milk flow. If your milk flows slowly, you may need to continue this action during the feeding.  Support your breast with 4 fingers underneath and your thumb above your nipple (make the letter "C" with your hand). Make sure your fingers are well away from your nipple and your baby's mouth.  Stroke your baby's lips gently with your finger or nipple.  When your baby's mouth is open wide enough, quickly bring your baby to your breast, placing your entire nipple and as much of the areola as possible into your baby's mouth. The areola is the colored area around your nipple. ? More areola should be visible above your baby's upper lip than below the lower lip. ? Your baby's lips should be opened and extended outward (flanged) to ensure an adequate, comfortable latch. ? Your baby's tongue should be between his or her lower gum and your breast.  Make sure that your baby's mouth is correctly positioned around your nipple (latched). Your baby's lips should create a seal on your breast and be turned out (everted).  It is common for your baby to suck about 2-3 minutes in order to start the flow of breast  milk. Latching Teaching your baby how to latch onto your breast properly is very important. An improper latch can cause nipple pain, decreased milk supply, and poor weight gain in your baby. Also, if your baby is not latched onto your nipple properly, he or she may swallow some air during feeding. This can make your baby fussy. Burping your baby when you switch breasts during the feeding can help to get rid of the air. However, teaching your baby to latch on properly is still the best way to prevent fussiness from swallowing air while breastfeeding. Signs that your baby has successfully latched onto your nipple  Silent tugging or silent sucking, without causing you pain. Infant's lips should be extended outward (flanged).  Swallowing heard between every 3-4 sucks once your milk has started to flow (after your let-down milk reflex occurs).  Muscle movement above and in front of his or her ears while sucking.  Signs that your baby has not successfully latched onto your nipple  Sucking sounds or smacking sounds from your baby while breastfeeding.  Nipple pain.  If you think your baby has not latched on correctly, slip your finger into the corner of your baby's mouth to break the suction and place it between your baby's gums. Attempt to start breastfeeding again. Signs of successful breastfeeding Signs from your baby  Your baby will gradually decrease the number of sucks or will completely stop sucking.  Your baby will fall asleep.  Your baby's body will relax.  Your baby will retain a small amount of milk in his or her mouth.  Your baby will let go of your breast by himself or herself.  Signs from you  Breasts that have increased in firmness, weight, and size 1-3 hours after feeding.  Breasts that are softer immediately after breastfeeding.  Increased milk volume, as well as a change in milk consistency and color by the fifth day of breastfeeding.  Nipples that are not sore,  cracked, or bleeding.  Signs that your baby is getting enough milk  Wetting at least 1-2 diapers during the first 24 hours after birth.  Wetting at least 5-6 diapers every 24 hours for the first week after birth. The urine should be clear or pale yellow by the age of 5   days.  Wetting 6-8 diapers every 24 hours as your baby continues to grow and develop.  At least 3 stools in a 24-hour period by the age of 5 days. The stool should be soft and yellow.  At least 3 stools in a 24-hour period by the age of 7 days. The stool should be seedy and yellow.  No loss of weight greater than 10% of birth weight during the first 3 days of life.  Average weight gain of 4-7 oz (113-198 g) per week after the age of 4 days.  Consistent daily weight gain by the age of 5 days, without weight loss after the age of 2 weeks. After a feeding, your baby may spit up a small amount of milk. This is normal. Breastfeeding frequency and duration Frequent feeding will help you make more milk and can prevent sore nipples and extremely full breasts (breast engorgement). Breastfeed when you feel the need to reduce the fullness of your breasts or when your baby shows signs of hunger. This is called "breastfeeding on demand." Signs that your baby is hungry include:  Increased alertness, activity, or restlessness.  Movement of the head from side to side.  Opening of the mouth when the corner of the mouth or cheek is stroked (rooting).  Increased sucking sounds, smacking lips, cooing, sighing, or squeaking.  Hand-to-mouth movements and sucking on fingers or hands.  Fussing or crying.  Avoid introducing a pacifier to your baby in the first 4-6 weeks after your baby is born. After this time, you may choose to use a pacifier. Research has shown that pacifier use during the first year of a baby's life decreases the risk of sudden infant death syndrome (SIDS). Allow your baby to feed on each breast as long as he or she  wants. When your baby unlatches or falls asleep while feeding from the first breast, offer the second breast. Because newborns are often sleepy in the first few weeks of life, you may need to awaken your baby to get him or her to feed. Breastfeeding times will vary from baby to baby. However, the following rules can serve as a guide to help you make sure that your baby is properly fed:  Newborns (babies 4 weeks of age or younger) may breastfeed every 1-3 hours.  Newborns should not go without breastfeeding for longer than 3 hours during the day or 5 hours during the night.  You should breastfeed your baby a minimum of 8 times in a 24-hour period.  Breast milk pumping Pumping and storing breast milk allows you to make sure that your baby is exclusively fed your breast milk, even at times when you are unable to breastfeed. This is especially important if you go back to work while you are still breastfeeding, or if you are not able to be present during feedings. Your lactation consultant can help you find a method of pumping that works best for you and give you guidelines about how long it is safe to store breast milk. Caring for your breasts while you breastfeed Nipples can become dry, cracked, and sore while breastfeeding. The following recommendations can help keep your breasts moisturized and healthy:  Avoid using soap on your nipples.  Wear a supportive bra designed especially for nursing. Avoid wearing underwire-style bras or extremely tight bras (sports bras).  Air-dry your nipples for 3-4 minutes after each feeding.  Use only cotton bra pads to absorb leaked breast milk. Leaking of breast milk between feedings is normal.    Use lanolin on your nipples after breastfeeding. Lanolin helps to maintain your skin's normal moisture barrier. Pure lanolin is not harmful (not toxic) to your baby. You may also hand express a few drops of breast milk and gently massage that milk into your nipples and  allow the milk to air-dry.  In the first few weeks after giving birth, some women experience breast engorgement. Engorgement can make your breasts feel heavy, warm, and tender to the touch. Engorgement peaks within 3-5 days after you give birth. The following recommendations can help to ease engorgement:  Completely empty your breasts while breastfeeding or pumping. You may want to start by applying warm, moist heat (in the shower or with warm, water-soaked hand towels) just before feeding or pumping. This increases circulation and helps the milk flow. If your baby does not completely empty your breasts while breastfeeding, pump any extra milk after he or she is finished.  Apply ice packs to your breasts immediately after breastfeeding or pumping, unless this is too uncomfortable for you. To do this: ? Put ice in a plastic bag. ? Place a towel between your skin and the bag. ? Leave the ice on for 20 minutes, 2-3 times a day.  Make sure that your baby is latched on and positioned properly while breastfeeding.  If engorgement persists after 48 hours of following these recommendations, contact your health care provider or a lactation consultant. Overall health care recommendations while breastfeeding  Eat 3 healthy meals and 3 snacks every day. Well-nourished mothers who are breastfeeding need an additional 450-500 calories a day. You can meet this requirement by increasing the amount of a balanced diet that you eat.  Drink enough water to keep your urine pale yellow or clear.  Rest often, relax, and continue to take your prenatal vitamins to prevent fatigue, stress, and low vitamin and mineral levels in your body (nutrient deficiencies).  Do not use any products that contain nicotine or tobacco, such as cigarettes and e-cigarettes. Your baby may be harmed by chemicals from cigarettes that pass into breast milk and exposure to secondhand smoke. If you need help quitting, ask your health care  provider.  Avoid alcohol.  Do not use illegal drugs or marijuana.  Talk with your health care provider before taking any medicines. These include over-the-counter and prescription medicines as well as vitamins and herbal supplements. Some medicines that may be harmful to your baby can pass through breast milk.  It is possible to become pregnant while breastfeeding. If birth control is desired, ask your health care provider about options that will be safe while breastfeeding your baby. Where to find more information: [employer]: www.llli.org Contact a health care provider if:  You feel like you want to stop breastfeeding or have become frustrated with breastfeeding.  Your nipples are cracked or bleeding.  Your breasts are red, tender, or warm.  You have: ? Painful breasts or nipples. ? A swollen area on either breast. ? A fever or chills. ? Nausea or vomiting. ? Drainage other than breast milk from your nipples.  Your breasts do not become full before feedings by the fifth day after you give birth.  You feel sad and depressed.  Your baby is: ? Too sleepy to eat well. ? Having trouble sleeping. ? More than 1 week old and wetting fewer than 6 diapers in a 24-hour period. ? Not gaining weight by 5 days of age.  Your baby has fewer than 3 stools in a   24-hour period.  Your baby's skin or the white parts of his or her eyes become yellow. Get help right away if:  Your baby is overly tired (lethargic) and does not want to wake up and feed.  Your baby develops an unexplained fever. Summary  Breastfeeding offers many health benefits for infant and mothers.  Try to breastfeed your infant when he or she shows early signs of hunger.  Gently tickle or stroke your baby's lips with your finger or nipple to allow the baby to open his or her mouth. Bring the baby to your breast. Make sure that much of the areola is in your baby's mouth. Offer one side and burp the  baby before you offer the other side.  Talk with your health care provider or lactation consultant if you have questions or you face problems as you breastfeed. This information is not intended to replace advice given to you by your health care provider. Make sure you discuss any questions you have with your health care provider. Document Released: 08/25/2005 Document Revised: 09/26/2016 Document Reviewed: 09/26/2016 Elsevier Interactive Patient Education  2018 [employer].  Trial of Labor After Cesarean Delivery A trial of labor after cesarean delivery (TOLAC) is when a woman tries to give birth vaginally after a previous cesarean delivery. TOLAC may be a safe and appropriate option for you depending on your medical history and other risk factors. When TOLAC is successful and you are able to have a vaginal delivery, this is called a vaginal birth after cesarean delivery (VBAC). Candidates for TOLAC TOLAC is possible for some women who:  Have undergone one or two prior cesarean deliveries in which the incision of the uterus was horizontal (low transverse).  Are carrying twins and have had one prior low transverse incision during a cesarean delivery.  Do not have a vertical (classical) uterine scar.  Have not had a tear in the wall of their uterus (uterine rupture).  TOLAC is also supported for women who meet appropriate criteria and:  Are under the age of 43 years.  Are tall and have a body mass index (BMI) of less than 30.  Have an unknown uterine scar.  Give birth in a facility equipped to handle an emergency cesarean delivery. This team should be able to handle possible complications such as a uterine rupture.  Have thorough counseling about the benefits and risks of TOLAC.  Have discussed future pregnancy plans with their health care provider.  Plan to have several more pregnancies.  Most successful candidates for TOLAC:  Have had a successful vaginal delivery before or  after their cesarean delivery.  Experience labor that begins naturally on or before the due date (40 weeks of gestation).  Do not have a very large (macrosomic) baby.  Had a prior cesarean delivery but are not currently experiencing factors that would prompt a cesarean delivery (such as a breech position).  Had only one prior cesarean delivery.  Had a prior cesarean delivery that was performed early in labor and not after full cervical dilation. TOLAC may be most appropriate for women who meet the above guidelines and who plan to have more pregnancies. TOLAC is not recommended for home births. Least successful candidates for TOLAC:  Have an induced labor with an unfavorable cervix. An unfavorable cervix is when the cervix is not dilating enough (among other factors).  Have never had a vaginal delivery.  Have had more than two cesarean deliveries.  Have a pregnancy at more than 40 weeks  of gestation.  Are pregnant with a baby with a suspected weight greater than 4,000 grams (8 pounds) and who have no prior history of a vaginal delivery.  Have closely spaced pregnancies. Suggested benefits of TOLAC  You may have a faster recovery time.  You may have a shorter stay in the hospital.  You may have less pain and fewer problems than with a cesarean delivery. Women who have a cesarean delivery have a higher chance of needing blood or getting a fever, an infection, or a blood clot in the legs. Suggested risks of TOLAC The highest risk of complications happens to women who attempt a TOLAC and fail. A failed TOLAC results in an unplanned cesarean delivery. Risks related to [facility] or repeat cesarean deliveries include:  Blood loss.  Infection.  Blood clot.  Injury to surrounding tissues or organs.  Having to remove the uterus (hysterectomy).  Potential problems with the placenta (such as placenta previa or placenta accreta) in future pregnancies.  Although very rare, the main  concerns with TOLAC are:  Rupture of the uterine scar from a past cesarean delivery.  Needing an emergency cesarean delivery.  Having a bad outcome for the baby (perinatal morbidity).  Where to find more information:  [employer] of [employer]: www.acog.[place]: www.midwife.org This information is not intended to replace advice given to you by your health care provider. Make sure you discuss any questions you have with your health care provider. Document Released: 05/13/2011 Document Revised: 07/23/2016 Document Reviewed: 02/14/2013 Elsevier Interactive Patient Education  [employer].

## 2018-05-21 LAB — OBSTETRIC PANEL, INCLUDING HIV
Antibody Screen: NEGATIVE
Basophils Absolute: 0 10*3/uL (ref 0.0–0.2)
Basos: 0 %
EOS (ABSOLUTE): 0.1 10*3/uL (ref 0.0–0.4)
EOS: 1 %
HEP B S AG: NEGATIVE
HIV SCREEN 4TH GENERATION: NONREACTIVE
Hematocrit: 35.9 % (ref 34.0–46.6)
Hemoglobin: 11.4 g/dL (ref 11.1–15.9)
IMMATURE GRANULOCYTES: 0 %
Immature Grans (Abs): 0 10*3/uL (ref 0.0–0.1)
LYMPHS ABS: 1.8 10*3/uL (ref 0.7–3.1)
Lymphs: 28 %
MCH: 26.5 pg — AB (ref 26.6–33.0)
MCHC: 31.8 g/dL (ref 31.5–35.7)
MCV: 84 fL (ref 79–97)
MONOS ABS: 0.6 10*3/uL (ref 0.1–0.9)
Monocytes: 9 %
NEUTROS ABS: 4.1 10*3/uL (ref 1.4–7.0)
Neutrophils: 62 %
PLATELETS: 268 10*3/uL (ref 150–450)
RBC: 4.3 x10E6/uL (ref 3.77–5.28)
RDW: 14.6 % (ref 12.3–15.4)
RH TYPE: POSITIVE
RPR: NONREACTIVE
Rubella Antibodies, IGG: 6.91 index (ref >0.99)
WBC: 6.6 10*3/uL (ref 3.4–10.8)

## 2018-05-21 LAB — TSH: TSH: 0.683 u[IU]/mL (ref 0.450–4.500)

## 2018-05-22 LAB — URINE CULTURE, OB REFLEX

## 2018-05-22 LAB — CULTURE, OB URINE

## 2018-05-24 LAB — CYTOLOGY - PAP
CHLAMYDIA, DNA PROBE: NEGATIVE
Diagnosis: NEGATIVE
HPV (WINDOPATH): NOT DETECTED
Neisseria Gonorrhea: NEGATIVE

## 2018-05-25 ENCOUNTER — Telehealth: Payer: Self-pay

## 2018-05-25 DIAGNOSIS — O219 Vomiting of pregnancy, unspecified: Secondary | ICD-10-CM

## 2018-05-25 MED ORDER — SCOPOLAMINE 1 MG/3DAYS TD PT72: 1.0000 | MEDICATED_PATCH | TRANSDERMAL | 12 refills | Status: DC

## 2018-05-25 NOTE — Telephone Encounter (Signed)
Return call to pt regarding concerns/ results "TSH" pt states she is unable to keep anything down  Pt states she was advised by Dr.Constant told her sx's worsened to go [facility] .  Pt states medication management is not working and had tried ginger products/tea and bland diet no relief. Pt advised to go to [facility] pt states she has no child care for her 5 children if she has to stay. Pt asked is there any other medication she can try? Please advise.

## 2018-05-25 NOTE — Telephone Encounter (Signed)
Scopolamine patch added; can just in combination with Zofran and other anti-nausea medications. If still no relief, she needs to come in as advised for evaluation.  She needs to read instructions on how to apply patches, every 3 days, avoid contact with eyes etc..   Verita Schneiders, MD, [place] [facility] [employer], [place]

## 2018-05-26 ENCOUNTER — Telehealth: Payer: Self-pay

## 2018-05-26 ENCOUNTER — Other Ambulatory Visit: Payer: Self-pay

## 2018-05-26 ENCOUNTER — Inpatient Hospital Stay (HOSPITAL_COMMUNITY)
Admission: AD | Admit: 2018-05-26 | Discharge: 2018-05-26 | Disposition: A | Discharge status: Home / Self Care | Payer: Medicaid Other | Source: Ambulatory Visit | Attending: Obstetrics & Gynecology | Admitting: Obstetrics & Gynecology

## 2018-05-26 ENCOUNTER — Encounter (HOSPITAL_COMMUNITY): Payer: Self-pay | Admitting: *Deleted

## 2018-05-26 DIAGNOSIS — Z8751 Personal history of pre-term labor: Secondary | ICD-10-CM | POA: Diagnosis not present

## 2018-05-26 DIAGNOSIS — O99341 Other mental disorders complicating pregnancy, first trimester: Secondary | ICD-10-CM | POA: Diagnosis not present

## 2018-05-26 DIAGNOSIS — Z3A11 11 weeks gestation of pregnancy: Secondary | ICD-10-CM | POA: Diagnosis not present

## 2018-05-26 DIAGNOSIS — O219 Vomiting of pregnancy, unspecified: Secondary | ICD-10-CM | POA: Insufficient documentation

## 2018-05-26 DIAGNOSIS — O99511 Diseases of the respiratory system complicating pregnancy, first trimester: Secondary | ICD-10-CM | POA: Diagnosis not present

## 2018-05-26 DIAGNOSIS — D573 Sickle-cell trait: Secondary | ICD-10-CM | POA: Diagnosis not present

## 2018-05-26 DIAGNOSIS — M109 Gout, unspecified: Secondary | ICD-10-CM | POA: Diagnosis not present

## 2018-05-26 DIAGNOSIS — O99011 Anemia complicating pregnancy, first trimester: Secondary | ICD-10-CM | POA: Insufficient documentation

## 2018-05-26 DIAGNOSIS — Z79899 Other long term (current) drug therapy: Secondary | ICD-10-CM | POA: Insufficient documentation

## 2018-05-26 DIAGNOSIS — Z9889 Other specified postprocedural states: Secondary | ICD-10-CM | POA: Diagnosis not present

## 2018-05-26 DIAGNOSIS — J45909 Unspecified asthma, uncomplicated: Secondary | ICD-10-CM | POA: Insufficient documentation

## 2018-05-26 DIAGNOSIS — O09219 Supervision of pregnancy with history of pre-term labor, unspecified trimester: Secondary | ICD-10-CM

## 2018-05-26 DIAGNOSIS — F319 Bipolar disorder, unspecified: Secondary | ICD-10-CM | POA: Diagnosis not present

## 2018-05-26 LAB — CBC
HCT: 35.8 % — ABNORMAL LOW (ref 36.0–46.0)
HEMOGLOBIN: 11.9 g/dL — AB (ref 12.0–15.0)
MCH: 27.4 pg (ref 26.0–34.0)
MCHC: 33.2 g/dL (ref 30.0–36.0)
MCV: 82.3 fL (ref 78.0–100.0)
Platelets: 239 10*3/uL (ref 150–400)
RBC: 4.35 MIL/uL (ref 3.87–5.11)
RDW: 13.6 % (ref 11.5–15.5)
WBC: 7.5 10*3/uL (ref 4.0–10.5)

## 2018-05-26 LAB — COMPREHENSIVE METABOLIC PANEL
ALBUMIN: 4 g/dL (ref 3.5–5.0)
ALK PHOS: 39 U/L (ref 38–126)
ALT: 13 U/L (ref 0–44)
ANION GAP: 9 (ref 5–15)
AST: 15 U/L (ref 15–41)
BUN: 6 mg/dL (ref 6–20)
CHLORIDE: 104 mmol/L (ref 98–111)
CO2: 22 mmol/L (ref 22–32)
Calcium: 9.5 mg/dL (ref 8.9–10.3)
Creatinine, Ser: 0.64 mg/dL (ref 0.44–1.00)
GFR calc Af Amer: >60 mL/min (ref >60)
GFR calc non Af Amer: >60 mL/min (ref >60)
GLUCOSE: 84 mg/dL (ref 70–99)
POTASSIUM: 4 mmol/L (ref 3.5–5.1)
SODIUM: 135 mmol/L (ref 135–145)
Total Bilirubin: 0.6 mg/dL (ref 0.3–1.2)
Total Protein: 7.3 g/dL (ref 6.5–8.1)

## 2018-05-26 LAB — URINALYSIS, ROUTINE W REFLEX MICROSCOPIC
Bilirubin Urine: NEGATIVE
GLUCOSE, UA: NEGATIVE mg/dL
Hgb urine dipstick: NEGATIVE
Ketones, ur: 5 mg/dL — AB
LEUKOCYTES UA: NEGATIVE
NITRITE: NEGATIVE
PH: 7 (ref 5.0–8.0)
Protein, ur: NEGATIVE mg/dL
Specific Gravity, Urine: 1.014 (ref 1.005–1.030)

## 2018-05-26 MED ORDER — FAMOTIDINE IN NACL 20-0.9 MG/50ML-% IV SOLN
20.0000 mg | Freq: Once | INTRAVENOUS | Status: AC
  Administered 2018-05-26: 20 mg via INTRAVENOUS
  Filled 2018-05-26: qty 50

## 2018-05-26 MED ORDER — PROMETHAZINE HCL 25 MG/ML IJ SOLN
25.0000 mg | Freq: Once | INTRAMUSCULAR | Status: AC
  Administered 2018-05-26: 25 mg via INTRAVENOUS
  Filled 2018-05-26: qty 1

## 2018-05-26 MED ORDER — PROMETHAZINE HCL 25 MG PO TABS: 12.5000 mg | ORAL_TABLET | Freq: Four times a day (QID) | ORAL | 5 refills | Status: DC | PRN

## 2018-05-26 MED ORDER — M.V.I. ADULT IV INJ
Freq: Once | INTRAVENOUS | Status: AC
  Administered 2018-05-26: 15:00:00 via INTRAVENOUS
  Filled 2018-05-26: qty 1000

## 2018-05-26 MED ORDER — ONDANSETRON 4 MG PO TBDP: 4.0000 mg | ORAL_TABLET | Freq: Four times a day (QID) | ORAL | 5 refills | Status: DC | PRN

## 2018-05-26 MED ORDER — SCOPOLAMINE 1 MG/3DAYS TD PT72: 1.0000 | MEDICATED_PATCH | TRANSDERMAL | 12 refills | Status: DC

## 2018-05-26 MED ORDER — SCOPOLAMINE 1 MG/3DAYS TD PT72
1.0000 | MEDICATED_PATCH | TRANSDERMAL | Status: DC
  Administered 2018-05-26: 1.5 mg via TRANSDERMAL
  Filled 2018-05-26: qty 1

## 2018-05-26 MED ORDER — PANTOPRAZOLE SODIUM 40 MG PO TBEC: 40.0000 mg | DELAYED_RELEASE_TABLET | Freq: Every day | ORAL | 5 refills | Status: DC

## 2018-05-26 NOTE — Telephone Encounter (Signed)
Pt tried to pick up Rx Patches. Unable to pay for Rx due to Ins not covering Rx. Please advise.

## 2018-05-26 NOTE — [facility] Note (Signed)
Pt presents with c/o N&V, reports unable to keep anything down.  Reports has vomited twice in 24 hours.

## 2018-05-26 NOTE — Telephone Encounter (Signed)
May need pre-authorization. Need to call and let Medicaid know she has failed other options.  Verita Schneiders, MD

## 2018-05-26 NOTE — MAU Provider Note (Signed)
Chief Complaint: Emesis and Nausea   First Provider Initiated Contact with Patient 05/26/18 1313      SUBJECTIVE HPI: Lindsay Ramirez is a 32 y.o. [id number] at 54w1dby LMP who presents to maternity admissions reporting daily nausea and vomiting, not relieved by the medications she has at home. She reports vomiting x 2 today, which is better than some days.  But, she feels constant nausea with a bad salty taste in her mouth. It has been hard to take care of her other children because she feels so bad.  She has tried Phenergan and Diclegis which did not help. Today, she took Zofran but the nausea did not improve so she came to be evaluated. She has an Rx for Scopolamine patch but it is not covered by her insurance so she has not tried it.  There are no other associated symptoms. She reports a 1-2 lb weight loss since early pregnancy.   She denies vaginal bleeding, abdominal pain, vaginal itching/burning, urinary symptoms, h/a, dizziness, or fever/chills.     HPI  Past Medical History:  Diagnosis Date  . Abnormal Pap smear    LAST PAP 06/2011  . Anemia    CHRONIC  . Asthma    rarely uses inhaler,  [facility]  . Bipolar affective disorder ([facility]   . Bipolar affective disorder ([facility] 2006   TOOK MEDS X 3 YR  . Depression    PP 2013  . Gout    feet - no meds  . Headache(784.0)    otc med prn  . Infection    Hx -UTI  . Mental disorder   . Missed ab 10/2013  . Sickle cell trait ([facility]   . SVD (spontaneous vaginal delivery)    x 4  . Vaginal Pap smear, abnormal    Past Surgical History:  Procedure Laterality Date  . CESAREAN SECTION    . CESAREAN SECTION MULTI-GESTATIONAL N/A 09/19/2014   Procedure: CESAREAN SECTION MULTI-GESTATIONAL;  Surgeon: CShelly Bombard MD;  [facility];  Service: Obstetrics;  Laterality: N/A;  . DILATION AND CURETTAGE OF UTERUS    . DILATION AND EVACUATION N/A 11/04/2013   Procedure: DILATATION AND EVACUATION;  Surgeon: SAlwyn Pea MD;  [facility];  Service: Gynecology;  Laterality: N/A;  . MOUTH SURGERY     wisdom teeth ext  . VAGINAL DELIVERY     x 4  . WISDOM TOOTH EXTRACTION  2012   Social History   Socioeconomic History  . Marital status: Divorced    Spouse name: Not on file  . Number of children: 3  . Years of education: 148 . Highest education level: Not on file  Occupational History  . Occupation: [employer] . 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 Financial resource strain: Not on file  . Food insecurity:    Worry: Not on file    Inability: Not on file  . Transportation needs:    Medical: Not on file    Non-medical: Not on file  Tobacco Use  . Smoking status: Never Smoker  . Smokeless tobacco: Never Used  Substance and Sexual Activity  . Alcohol use: No    Alcohol/week: 0.0 standard drinks  . Drug use: No  . Sexual activity: Yes    Partners: Male  Lifestyle  . Physical activity:    Days per week: Not on file    Minutes per session: Not on file  .  Stress: Not on file  Relationships  . Social connections:    Talks on phone: Not on file    Gets together: Not on file    Attends religious service: Not on file    Active member of club or organization: Not on file    Attends meetings of clubs or organizations: Not on file    Relationship status: Not on file  . Intimate partner violence:    Fear of current or ex partner: Not on file    Emotionally abused: Not on file    Physically abused: Not on file    Forced sexual activity: Not on file  Other Topics Concern  . Not on file  Social History Narrative  . Not on file   No current facility-administered medications on file prior to encounter.    Current Outpatient Medications on File Prior to Encounter  Medication Sig Dispense Refill  . Doxylamine-Pyridoxine (DICLEGIS) 10-10 MG TBEC Lindsay Ramirez (Patient not taking: Reported on 05/20/2018) 60 tablet 4  . flintstones complete (FLINTSTONES) 60 MG chewable  tablet Chew 1 tablet by mouth daily. (Patient not taking: Reported on 05/20/2018) 30 tablet 12  . metoCLOPramide (REGLAN) 10 MG tablet Take 1 tablet (10 mg total) by mouth 3 (three) times daily before meals. (Patient not taking: Reported on 05/20/2018) 90 tablet 4  . ondansetron (ZOFRAN ODT) 4 MG disintegrating tablet Take 1 tablet (4 mg total) by mouth every 6 (six) hours as needed for nausea. 20 tablet 0  . Prenatal Vit-Fe Fum-Fe Bisg-FA (NATACHEW) 28-1 MG CHEW Chew 1 tablet by mouth daily. (Patient not taking: Reported on 04/22/2018) 30 tablet 11  . Prenatal Vit-Fe Phos-FA-Omega (VITAFOL GUMMIES) 3.33-0.333-34.8 MG CHEW Chew 2 tablets by mouth daily. 60 tablet 6  . promethazine (PHENERGAN) 25 MG tablet Take 0.5-1 tablets (12.5-25 mg total) by mouth every 6 (six) hours as needed. (Patient not taking: Reported on 05/20/2018) 30 tablet 0  . scopolamine (TRANSDERM-SCOP, 1.5 MG,) 1 MG/3DAYS Place 1 patch (1.5 mg total) onto the skin every 3 (three) days. 10 patch 12   No Known Allergies  ROS:  Review of Systems  Constitutional: Negative for chills, fatigue and fever.  Respiratory: Negative for shortness of breath.   Cardiovascular: Negative for chest pain.  Gastrointestinal: Positive for nausea and vomiting. Negative for abdominal pain.  Genitourinary: Negative for difficulty urinating, dysuria, flank pain, pelvic pain, vaginal bleeding, vaginal discharge and vaginal pain.  Neurological: Negative for dizziness and headaches.  Psychiatric/Behavioral: Negative.      I have reviewed patient's Past Medical Hx, Surgical Hx, Family Hx, Social Hx, medications and allergies.   Physical Exam   Patient Vitals for the past 24 hrs:  BP Temp Temp src Pulse Resp SpO2 Height Weight  05/26/18 1233 118/69 99 F (37.2 C) Oral 65 20 100 % 5' 2"  (1.575 m) 95 kg   Constitutional: Well-developed, well-nourished female in no acute distress.  Cardiovascular: normal rate Respiratory: normal effort GI: Abd soft,  non-tender. Pos BS x 4 MS: Extremities nontender, no edema, normal ROM Neurologic: Alert and oriented x 4.  GU: Neg CVAT.  PELVIC EXAM: Deferred   LAB RESULTS  Results for orders placed or performed during the hospital encounter of 05/26/18 (from the past 72 hour(s))  Urinalysis, Routine w reflex microscopic     Status: Abnormal   Collection Time: 05/26/18 12:54 PM  Result Value Ref Range   Color, Urine YELLOW YELLOW   APPearance HAZY (A) CLEAR   Specific Gravity, Urine 1.014  1.005 - 1.030   pH 7.0 5.0 - 8.0   Glucose, UA NEGATIVE NEGATIVE mg/dL   Hgb urine dipstick NEGATIVE NEGATIVE   Bilirubin Urine NEGATIVE NEGATIVE   Ketones, ur 5 (A) NEGATIVE mg/dL   Protein, ur NEGATIVE NEGATIVE mg/dL   Nitrite NEGATIVE NEGATIVE   Leukocytes, UA NEGATIVE NEGATIVE    Comment: Performed at [facility], [address] [place], [place] [address]  CBC     Status: Abnormal   Collection Time: 05/26/18  2:42 PM  Result Value Ref Range   WBC 7.5 4.0 - 10.5 K/uL   RBC 4.35 3.87 - 5.11 MIL/uL   Hemoglobin 11.9 (L) 12.0 - 15.0 g/dL   HCT 35.8 (L) 36.0 - 46.0 %   MCV 82.3 78.0 - 100.0 fL   MCH 27.4 26.0 - 34.0 pg   MCHC 33.2 30.0 - 36.0 g/dL   RDW 13.6 11.5 - 15.5 %   Platelets 239 150 - 400 K/uL    Comment: Performed at [facility], [address] [place], [place] [address]  Comprehensive metabolic panel     Status: None   Collection Time: 05/26/18  2:42 PM  Result Value Ref Range   Sodium 135 135 - 145 mmol/L   Potassium 4.0 3.5 - 5.1 mmol/L   Chloride 104 98 - 111 mmol/L   CO2 22 22 - 32 mmol/L   Glucose, Bld 84 70 - 99 mg/dL   BUN 6 6 - 20 mg/dL   Creatinine, Ser 0.64 0.44 - 1.00 mg/dL   Calcium 9.5 8.9 - 10.3 mg/dL   Total Protein 7.3 6.5 - 8.1 g/dL   Albumin 4.0 3.5 - 5.0 g/dL   AST 15 15 - 41 U/L   ALT 13 0 - 44 U/L   Alkaline Phosphatase 39 38 - 126 U/L   Total Bilirubin 0.6 0.3 - 1.2 mg/dL   GFR calc non Af Amer >60 >60 mL/min   GFR calc Af Amer >60 >60 mL/min     Comment: (NOTE) The eGFR has been calculated using the CKD EPI equation. This calculation has not been validated in all clinical situations. eGFR's persistently <60 mL/min signify possible Chronic Kidney Disease.    Anion gap 9 5 - 15    Comment: Performed at [facility], [address], [place], [place] [address]    O/Positive/-- (09/12 1100)  IMAGING No results found.  MAU Management/MDM: Ordered labs and reviewed results.  CBC, CMP wnl.  Phenergan and Zofran given IV, along with IV fluids, in MAU.  Nausea remains so scopolamine patch applied in MAU. Mild improvement in nausea, no vomiting while in MAU.  Pt to resume Diclegis with TID dosing as prescribed.  Use Phenergan and Zofran for breakthrough nuasea.  Add Protonix daily.  Discussed Good Rx coupon if pt desires to use scopolamine patches as prescribed.  Pt to f/u in office as scheduled. Return to MAU if symptoms persist or worsen.  Pt discharged with strict return precautions.  ASSESSMENT  1. Nausea and vomiting during pregnancy prior to [redacted] weeks gestation   2. Previous preterm delivery, antepartum   3. Nausea/vomiting in pregnancy    PLAN Discharge home    Allergies as of 05/26/2018   No Known Allergies     Medication List    STOP taking these medications   flintstones complete 60 MG chewable tablet   metoCLOPramide 10 MG tablet Commonly known as:  REGLAN   NATACHEW 28-1 MG Chew   VITAFOL GUMMIES 3.33-0.333-34.8 MG  Chew     TAKE these medications   acetaminophen 500 MG tablet Commonly known as:  TYLENOL Take 500 mg by mouth every 6 (six) hours as needed.   Doxylamine-Pyridoxine 10-10 MG Tbec Lindsay Ramirez   ondansetron 4 MG disintegrating tablet Commonly known as:  ZOFRAN-ODT Take 1 tablet (4 mg total) by mouth every 6 (six) hours as needed for nausea.   pantoprazole 40 MG tablet Commonly known as:  PROTONIX Take 1 tablet (40 mg total) by mouth daily.   promethazine 25 MG tablet Commonly  known as:  PHENERGAN Take 0.5-1 tablets (12.5-25 mg total) by mouth every 6 (six) hours as needed.   scopolamine 1 MG/3DAYS Commonly known as:  TRANSDERM-SCOP Place 1 patch (1.5 mg total) onto the skin every 3 (three) days.       Fatima Blank Certified Nurse-Midwife 05/26/2018  1:13 PM

## 2018-05-26 NOTE — Telephone Encounter (Signed)
Pt made aware Rx was sent  And advised if no relief she needs an evaluation.

## 2018-05-28 LAB — SMN1 COPY NUMBER ANALYSIS (SMA CARRIER SCREENING)

## 2018-05-28 LAB — CYSTIC FIBROSIS MUTATION 97: Interpretation: NOT DETECTED

## 2018-06-17 ENCOUNTER — Ambulatory Visit (INDEPENDENT_AMBULATORY_CARE_PROVIDER_SITE_OTHER): Payer: Medicaid Other | Admitting: Obstetrics and Gynecology

## 2018-06-17 ENCOUNTER — Encounter: Payer: Self-pay | Admitting: Obstetrics and Gynecology

## 2018-06-17 VITALS — BP 111/72 | HR 76 | Wt 213.3 lb

## 2018-06-17 DIAGNOSIS — Z3482 Encounter for supervision of other normal pregnancy, second trimester: Secondary | ICD-10-CM

## 2018-06-17 DIAGNOSIS — O09212 Supervision of pregnancy with history of pre-term labor, second trimester: Secondary | ICD-10-CM

## 2018-06-17 DIAGNOSIS — D573 Sickle-cell trait: Secondary | ICD-10-CM | POA: Insufficient documentation

## 2018-06-17 DIAGNOSIS — R110 Nausea: Secondary | ICD-10-CM

## 2018-06-17 DIAGNOSIS — O09219 Supervision of pregnancy with history of pre-term labor, unspecified trimester: Secondary | ICD-10-CM

## 2018-06-17 DIAGNOSIS — Z3480 Encounter for supervision of other normal pregnancy, unspecified trimester: Secondary | ICD-10-CM

## 2018-06-17 DIAGNOSIS — O34219 Maternal care for unspecified type scar from previous cesarean delivery: Secondary | ICD-10-CM

## 2018-06-17 MED ORDER — DOXYLAMINE-PYRIDOXINE 10-10 MG PO TBEC: DELAYED_RELEASE_TABLET | ORAL | 4 refills | Status: DC

## 2018-06-17 NOTE — Progress Notes (Signed)
Pt presents for ROB c/o morning sickness. Diclegis, Phenergan, Zofran is not helping.

## 2018-06-17 NOTE — Progress Notes (Signed)
   PRENATAL VISIT NOTE  Subjective:  Lindsay Ramirez is a 32 y.o. [phone] at [redacted]w[redacted]d being seen today for ongoing prenatal care.  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  Patient reports no complaints.  Contractions: Not present. Vag. Bleeding: None.  Movement: Absent. Denies leaking of fluid.   The following portions of the patient's history were reviewed and updated as appropriate: allergies, current medications, past family history, past medical history, past social history, past surgical history and problem list. Problem list updated.  Objective:   Vitals:   06/17/18 1046  BP: 111/72  Pulse: 76  Weight: 213 lb 4.8 oz (96.8 kg)    Fetal Status: Fetal Heart Rate (bpm): 162   Movement: Absent     General:  Alert, oriented and cooperative. Patient is in no acute distress.  Skin: Skin is warm and dry. No rash noted.   Cardiovascular: Normal heart rate noted  Respiratory: Normal respiratory effort, no problems with respiration noted  Abdomen: Soft, gravid, appropriate for gestational age.  Pain/Pressure: Present     Pelvic: Cervical exam deferred        Extremities: Normal range of motion.  Edema: None  Mental Status: Normal mood and affect. Normal behavior. Normal judgment and thought content.   Assessment and Plan:  Pregnancy: [id number] at [redacted]w[redacted]d  1. Supervision of other normal pregnancy, antepartum Some nausea, encouraged her to take diclegis BID Will attempt to get scopalamine patch approved  2. Previous cesarean delivery, antepartum  3. Previous preterm delivery, antepartum CS indicated was IUGR with poor doppler studies and breech  presentation of twin A & B  4. Sickle cell trait ([facility]) Urine cx Q trim   Preterm labor symptoms and general obstetric precautions including but not limited to vaginal bleeding, contractions, leaking of fluid and fetal movement were reviewed in detail with the patient. Please refer to After Visit Summary for other counseling recommendations.  Return in about 4 weeks (around 07/15/2018) for OB visit.  Future Appointments  Date Time Provider [place]  07/14/2018 10:15 AM [place] 4 [facility]  07/15/2018  9:30 AM Constant, Vickii Chafe, MD [facility] None    Sloan Leiter, MD

## 2018-07-07 ENCOUNTER — Encounter (HOSPITAL_COMMUNITY): Payer: Self-pay

## 2018-07-14 ENCOUNTER — Ambulatory Visit (HOSPITAL_COMMUNITY): Payer: Medicaid Other

## 2018-07-15 ENCOUNTER — Encounter: Payer: Medicaid Other | Admitting: Obstetrics and Gynecology

## 2018-08-02 ENCOUNTER — Ambulatory Visit (INDEPENDENT_AMBULATORY_CARE_PROVIDER_SITE_OTHER): Payer: Medicaid Other | Admitting: Obstetrics and Gynecology

## 2018-08-02 ENCOUNTER — Other Ambulatory Visit (HOSPITAL_COMMUNITY)
Admission: RE | Admit: 2018-08-02 | Discharge: 2018-08-02 | Disposition: A | Discharge status: Home / Self Care | Payer: Medicaid Other | Source: Ambulatory Visit | Attending: Obstetrics and Gynecology | Admitting: Obstetrics and Gynecology

## 2018-08-02 ENCOUNTER — Encounter: Payer: Self-pay | Admitting: Obstetrics and Gynecology

## 2018-08-02 VITALS — BP 108/69 | HR 85 | Temp 101.0°F | Wt 213.0 lb

## 2018-08-02 DIAGNOSIS — Z3480 Encounter for supervision of other normal pregnancy, unspecified trimester: Secondary | ICD-10-CM | POA: Insufficient documentation

## 2018-08-02 DIAGNOSIS — Z3A20 20 weeks gestation of pregnancy: Secondary | ICD-10-CM

## 2018-08-02 DIAGNOSIS — O34219 Maternal care for unspecified type scar from previous cesarean delivery: Secondary | ICD-10-CM

## 2018-08-02 DIAGNOSIS — Z3482 Encounter for supervision of other normal pregnancy, second trimester: Secondary | ICD-10-CM

## 2018-08-02 NOTE — Progress Notes (Signed)
   PRENATAL VISIT NOTE  Subjective:  Lindsay Ramirez is a 32 y.o. [phone] at [redacted]w[redacted]d being seen today for ongoing prenatal care.  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  Patient reports URI and vaginal discharge with odor.  Contractions: Not present. Vag. Bleeding: None.  Movement: Absent. Denies leaking of fluid.   The following portions of the patient's history were reviewed and updated as appropriate: allergies, current medications, past family history, past medical history, past social history, past surgical history and problem list. Problem list updated.  Objective:   Vitals:   08/02/18 1454  BP: 108/69  Pulse: 85  Temp: (!) 101 F (38.3 C)  Weight: 213 lb (96.6 kg)    Fetal Status: Fetal Heart Rate (bpm): 165 Fundal Height: 20 cm Movement: Absent     General:  Alert, oriented and cooperative. Patient is in no acute distress.  Skin: Skin is warm and dry. No rash noted.   Cardiovascular: Normal heart rate noted  Respiratory: Normal respiratory effort, no problems with respiration noted  Abdomen: Soft, gravid, appropriate for gestational age.  Pain/Pressure: Absent     Pelvic: Cervical exam deferred        Extremities: Normal range of motion.     Mental Status: Normal mood and affect. Normal behavior. Normal judgment and thought content.   Assessment and Plan:  Pregnancy: [id number] at [redacted]w[redacted]d  1. Supervision of other normal pregnancy, antepartum Patient is doing well  Cultures collected to evaluate vaginitis Patient with URI- comfort measures and OTC medications discussed Anatomy ultrasound ordered  2. Previous cesarean delivery,  antepartum Will discuss delivery options at a later date  Preterm labor symptoms and general obstetric precautions including but not limited to vaginal bleeding, contractions, leaking of fluid and fetal movement were reviewed in detail with the patient. Please refer to After Visit Summary for other counseling recommendations.  No follow-ups on file.  No future appointments.  Mora Bellman, MD

## 2018-08-03 LAB — CERVICOVAGINAL ANCILLARY ONLY
BACTERIAL VAGINITIS: POSITIVE — AB
CHLAMYDIA, DNA PROBE: NEGATIVE
Candida vaginitis: NEGATIVE
NEISSERIA GONORRHEA: NEGATIVE

## 2018-08-05 MED ORDER — METRONIDAZOLE 500 MG PO TABS: 500.0000 mg | ORAL_TABLET | Freq: Two times a day (BID) | ORAL | 0 refills | Status: DC

## 2018-08-05 NOTE — Addendum Note (Signed)
Addended by: Mora Bellman on: 08/05/2018 05:31 PM   Modules accepted: Orders

## 2018-08-11 ENCOUNTER — Encounter (HOSPITAL_COMMUNITY): Payer: Self-pay

## 2018-08-16 ENCOUNTER — Other Ambulatory Visit (HOSPITAL_COMMUNITY): Payer: Medicaid Other

## 2018-08-19 ENCOUNTER — Other Ambulatory Visit (HOSPITAL_COMMUNITY): Payer: Self-pay | Admitting: *Deleted

## 2018-08-19 ENCOUNTER — Ambulatory Visit (HOSPITAL_COMMUNITY)
Admission: RE | Admit: 2018-08-19 | Discharge: 2018-08-19 | Disposition: A | Discharge status: Home / Self Care | Payer: Medicaid Other | Source: Ambulatory Visit | Attending: Obstetrics and Gynecology | Admitting: Obstetrics and Gynecology

## 2018-08-19 DIAGNOSIS — O09292 Supervision of pregnancy with other poor reproductive or obstetric history, second trimester: Secondary | ICD-10-CM

## 2018-08-19 DIAGNOSIS — O09212 Supervision of pregnancy with history of pre-term labor, second trimester: Secondary | ICD-10-CM | POA: Diagnosis not present

## 2018-08-19 DIAGNOSIS — O99212 Obesity complicating pregnancy, second trimester: Secondary | ICD-10-CM

## 2018-08-19 DIAGNOSIS — Z3A23 23 weeks gestation of pregnancy: Secondary | ICD-10-CM

## 2018-08-19 DIAGNOSIS — Z362 Encounter for other antenatal screening follow-up: Secondary | ICD-10-CM

## 2018-08-19 DIAGNOSIS — Z3480 Encounter for supervision of other normal pregnancy, unspecified trimester: Secondary | ICD-10-CM

## 2018-08-19 DIAGNOSIS — Z363 Encounter for antenatal screening for malformations: Secondary | ICD-10-CM

## 2018-08-23 ENCOUNTER — Telehealth: Payer: Self-pay

## 2018-08-23 MED ORDER — COMFORT FIT MATERNITY SUPP LG MISC: 0 refills | Status: DC

## 2018-08-23 NOTE — Telephone Encounter (Signed)
Done

## 2018-08-23 NOTE — Telephone Encounter (Signed)
Pt informed. Pt states that she will pick up rx tomorrow

## 2018-08-23 NOTE — Addendum Note (Signed)
Addended by: Mora Bellman on: 08/23/2018 02:54 PM   Modules accepted: Orders

## 2018-08-23 NOTE — Telephone Encounter (Signed)
Pt called stating that she is experiencing swollen feet. She states that her BP 101/53. She is not experiencing any other sx. Advised pt to elevate feet, and wear good supportive shoes to help with swelling. Continue to monitor bp, and if she starts experiencing sx of blurred vision, headaches, elevated bp then she needs to be evaluated.  Pt also request to have a rx for a maternity belt. Sent to provider for rx. Pt advised that she will have to come pick this up from the office.

## 2018-08-25 ENCOUNTER — Encounter: Payer: Medicaid Other | Admitting: Certified Nurse Midwife

## 2018-08-26 ENCOUNTER — Encounter: Payer: Self-pay | Admitting: Certified Nurse Midwife

## 2018-08-26 ENCOUNTER — Ambulatory Visit (INDEPENDENT_AMBULATORY_CARE_PROVIDER_SITE_OTHER): Payer: Medicaid Other | Admitting: Certified Nurse Midwife

## 2018-08-26 VITALS — BP 105/67 | HR 72 | Wt 216.0 lb

## 2018-08-26 DIAGNOSIS — F329 Major depressive disorder, single episode, unspecified: Secondary | ICD-10-CM

## 2018-08-26 DIAGNOSIS — Z3480 Encounter for supervision of other normal pregnancy, unspecified trimester: Secondary | ICD-10-CM

## 2018-08-26 DIAGNOSIS — F32A Depression, unspecified: Secondary | ICD-10-CM

## 2018-08-26 DIAGNOSIS — O34219 Maternal care for unspecified type scar from previous cesarean delivery: Secondary | ICD-10-CM

## 2018-08-26 DIAGNOSIS — Z3482 Encounter for supervision of other normal pregnancy, second trimester: Secondary | ICD-10-CM

## 2018-08-26 DIAGNOSIS — O99342 Other mental disorders complicating pregnancy, second trimester: Secondary | ICD-10-CM

## 2018-08-26 MED ORDER — SERTRALINE HCL 50 MG PO TABS: 50.0000 mg | ORAL_TABLET | Freq: Every day | ORAL | 3 refills | Status: DC

## 2018-08-26 NOTE — Patient Instructions (Signed)
Glucose Tolerance Test During Pregnancy Why am I having this test? The glucose tolerance test (GTT) is done to check how your body processes sugar (glucose). This is one of several tests used to diagnose diabetes that develops during pregnancy (gestational diabetes mellitus). Gestational diabetes is a temporary form of diabetes that some women develop during pregnancy. It usually occurs during the second trimester of pregnancy and goes away after delivery. Testing (screening) for gestational diabetes usually occurs between 24 and 28 weeks of pregnancy. You may have the GTT test after having a 1-hour glucose screening test if the results from that test indicate that you may have gestational diabetes. You may also have this test if:  You have a history of gestational diabetes.  You have a history of giving birth to very large babies or have experienced repeated fetal loss (stillbirth).  You have signs and symptoms of diabetes, such as: ? Changes in your vision. ? Tingling or numbness in your hands or feet. ? Changes in hunger, thirst, and urination that are not otherwise explained by your pregnancy. What is being tested? This test measures the amount of glucose in your blood at different times during a period of 3 hours. This indicates how well your body is able to process glucose. What kind of sample is taken?  Blood samples are required for this test. They are usually collected by inserting a needle into a blood vessel. How do I prepare for this test?  For 3 days before your test, eat normally. Have plenty of carbohydrate-rich foods.  Follow instructions from your health care provider about: ? Eating or drinking restrictions on the day of the test. You may be asked to not eat or drink anything other than water (fast) starting 8-10 hours before the test. ? Changing or stopping your regular medicines. Some medicines may interfere with this test. Tell a health care provider about:  All  medicines you are taking, including vitamins, herbs, eye drops, creams, and over-the-counter medicines.  Any blood disorders you have.  Any surgeries you have had.  Any medical conditions you have. What happens during the test? First, your blood glucose will be measured. This is referred to as your fasting blood glucose, since you fasted before the test. Then, you will drink a glucose solution that contains a certain amount of glucose. Your blood glucose will be measured again 1, 2, and 3 hours after drinking the solution. This test takes about 3 hours to complete. You will need to stay at the testing location during this time. During the testing period:  Do not eat or drink anything other than the glucose solution.  Do not exercise.  Do not use any products that contain nicotine or tobacco, such as cigarettes and e-cigarettes. If you need help stopping, ask your health care provider. The testing procedure may vary among health care providers and hospitals. How are the results reported? Your results will be reported as milligrams of glucose per deciliter of blood (mg/dL) or millimoles per liter (mmol/L). Your health care provider will compare your results to normal ranges that were established after testing a large group of people (reference ranges). Reference ranges may vary among labs and hospitals. For this test, common reference ranges are:  Fasting: less than 95-105 mg/dL (5.3-5.8 mmol/L).  1 hour after drinking glucose: less than 180-190 mg/dL (10.0-10.5 mmol/L).  2 hours after drinking glucose: less than 155-165 mg/dL (8.6-9.2 mmol/L).  3 hours after drinking glucose: 140-145 mg/dL (7.8-8.1 mmol/L). What do the   results mean? Results within reference ranges are considered normal, meaning that your glucose levels are well-controlled. If two or more of your blood glucose levels are high, you may be diagnosed with gestational diabetes. If only one level is high, your health care  provider may suggest repeat testing or other tests to confirm a diagnosis. Talk with your health care provider about what your results mean. Questions to ask your health care provider Ask your health care provider, or the department that is doing the test:  When will my results be ready?  How will I get my results?  What are my treatment options?  What other tests do I need?  What are my next steps? Summary  The glucose tolerance test (GTT) is one of several tests used to diagnose diabetes that develops during pregnancy (gestational diabetes mellitus). Gestational diabetes is a temporary form of diabetes that some women develop during pregnancy.  You may have the GTT test after having a 1-hour glucose screening test if the results from that test indicate that you may have gestational diabetes. You may also have this test if you have any symptoms or risk factors for gestational diabetes.  Talk with your health care provider about what your results mean. This information is not intended to replace advice given to you by your health care provider. Make sure you discuss any questions you have with your health care provider. Document Released: 02/24/2012 Document Revised: 04/06/2017 Document Reviewed: 04/06/2017 Elsevier Interactive Patient Education  2019 [employer]  

## 2018-08-26 NOTE — Progress Notes (Signed)
   PRENATAL VISIT NOTE  Subjective:  Lindsay Ramirez is a 32 y.o. [phone] at [redacted]w[redacted]d being seen today for ongoing prenatal care.  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  Patient reports depression.  Contractions: Not present.  .  Movement: Present. Denies leaking of fluid.   The following portions of the patient's history were reviewed and updated as appropriate: allergies, current medications, past family history, past medical history, past social history, past surgical history and problem list. Problem list updated.  Objective:  Vitals:   08/26/18 1132  BP: 105/67  Pulse: 72  Weight: 216 lb (98 kg)    Fetal Status: Fetal Heart Rate (bpm): 155 Fundal Height: 24 cm Movement: Present     General:  Alert, oriented and cooperative. Patient is in no acute distress.  Skin: Skin is warm and dry. No rash noted.   Cardiovascular: Normal heart rate noted  Respiratory: Normal respiratory effort, no problems with respiration noted  Abdomen: Soft, gravid, appropriate for gestational age.  Pain/Pressure: Absent     Pelvic: Cervical exam deferred        Extremities: Normal range of motion.     Mental Status: Normal mood and affect. Normal behavior. Normal judgment and thought content.   Assessment and Plan:  Pregnancy: [id number] at [redacted]w[redacted]d  1. Depression affecting pregnancy in second trimester, antepartum - Patient reports depression has been occurring since beginning of pregnancy but has increased over the past 2 weeks  - Reports depression daily, not wanting to get out of bed or go to work but keeps going because she "has kids to take care of".  - Denies  suicidal ideation  - Discussed support system, patient reports FOB is not involved and mother that is support system has been in and out of hospital - Educated on extended support system and coping mechanisms  - Ambulatory referral to [facility] - sertraline (ZOLOFT) 50 MG tablet; Take 1 tablet (50 mg total) by mouth daily.  Dispense: 30 tablet; Refill: 3  2. Supervision of other normal pregnancy, antepartum - Routine prenatal care  - Anticipatory guidance on upcoming appointments with GTT screening at next visit, discussed fasting prior to appointment  3. Previous cesarean delivery, antepartum - Previous C/S x1, unsure of delivery method at this time - Discussed options of TOLAC vs repeat C/S   Preterm labor symptoms and general obstetric precautions including but not limited to vaginal bleeding, contractions, leaking of fluid and fetal movement were reviewed in detail with the patient. Please refer to After Visit Summary for other counseling recommendations.  Return in about 4 weeks (around 09/23/2018) for ROB/2HRGTT/LABS.  Future Appointments  Date Time Provider [place]  08/27/2018 10:40 AM [place]  09/17/2018 10:15 AM [place] 4 [facility] MFC-US  09/23/2018  9:00 AM [facility] None  09/23/2018 10:45 AM Lajean Manes, CNM [facility] None    Lajean Manes, CNM

## 2018-08-27 ENCOUNTER — Institutional Professional Consult (permissible substitution): Payer: Medicaid Other

## 2018-08-27 NOTE — BH Specialist Note (Deleted)
Integrated Behavioral Health Initial Visit  MRN: [id number] Name: Lindsay Ramirez  Number of [place] Clinician visits:: 1/6 Session Start time: ***  Session End time: *** Total time: {IBH Total Time:21014050}  Type of Service: [place] Interpretor:{yes [id number]} Interpretor Name and Language: ***   Warm Hand Off Completed.       SUBJECTIVE: Lindsay Ramirez is a 32 y.o. female accompanied by {CHL AMB ACCOMPANIED GJ:[id number]} Patient was referred by *** for ***. Patient reports the following symptoms/concerns: *** Duration of problem: ***; Severity of problem: {Mild/Moderate/Severe:20260}  OBJECTIVE: Mood: {BHH MOOD:22306} and Affect: {BHH AFFECT:22307} Risk of harm to self or others: {CHL AMB BH Suicide Current Mental Status:21022748}  LIFE CONTEXT: Family and Social: *** School/Work: *** Self-Care: *** Life Changes: ***  GOALS ADDRESSED: Patient will: 1. Reduce symptoms of: {IBH Symptoms:21014056} 2. Increase knowledge and/or ability of: {IBH Patient Tools:21014057}  3. Demonstrate ability to: {IBH Goals:21014053}  INTERVENTIONS: Interventions utilized: {IBH Interventions:21014054}  Standardized Assessments completed: {IBH Screening Tools:21014051}  ASSESSMENT: Patient currently experiencing ***.   Patient may benefit from ***.  PLAN: 1. Follow up with behavioral health clinician on : *** 2. Behavioral recommendations: *** 3. Referral(s): {IBH Referrals:21014055} 4. "From scale of 1-10, how likely are you to follow plan?": ***  [name], LCSW  Depression screen [facility] 2/9 07/27/2014 05/04/2014  Decreased Interest 3 3  Down, Depressed, Hopeless 3 3  PHQ - 2 Score 6 6  Altered sleeping 3 3  Tired, decreased energy 3 3  Change in appetite 3 3  Feeling bad or failure about yourself  3 3  Trouble concentrating 3 3  Moving slowly or fidgety/restless 3 1  Suicidal thoughts 1 0  PHQ-9  Score 25 22

## 2018-08-30 ENCOUNTER — Encounter: Payer: Medicaid Other | Admitting: Obstetrics and Gynecology

## 2018-09-08 NOTE — L&D Delivery Note (Signed)
OB/GYN Faculty Practice Delivery Note  Lindsay Ramirez is a 33 y.o. [id number] s/p VBAC at [redacted]w[redacted]d. She was admitted for elective induction of labor, TOLAC.   ROM: 2h 65m with light meconium (particulate) fluid GBS Status: negative Maximum Maternal Temperature: Temp (24hrs), Avg:98.6 F (37 C), Min:98.4 F (36.9 C), Max:98.8 F (37.1 C)  Labor Progress: . Started on pitocin early afternoon . Epidural placement . AROM light meconium . Progressed to complete  Delivery Date/Time: 12/12/18 at 0010 Delivery: Called to room and patient was complete and pushing. Head delivered LOA. No nuchal cord present. Shoulder and body delivered in usual fashion. Infant with spontaneous cry, placed on mother's abdomen, dried and stimulated. Cord clamped x 2 after 1-minute delay, and cut by provider. Cord blood drawn. Placenta delivered spontaneously with gentle cord traction. Fundus firm with massage and Pitocin. Labia, perineum, vagina, and cervix inspected inspected with 1st degree perineal and bilateral periurethral lacerations.   Placenta: spontaneous, intact, 3-vessel cord (to be discarded) Complications: light meconium Lacerations: 1st degree perineal (repaired with 3-0 Vicryl)  bilateral periurethral (repaired with 4-0 Monocryl) EBL: 804cc per [place] - methergine given empirically since grand multiparity status Analgesia: epidural   Postpartum Planning [x]  message to sent to schedule follow-up  [x]  vaccines UTD  Infant: Vigorous female  APGARs 8, [place]. Juleen China, DO OB/GYN Fellow, Faculty Practice

## 2018-09-13 NOTE — BH Specialist Note (Deleted)
Integrated Behavioral Health Initial Visit  MRN: [id number] Name: Lindsay Ramirez  Number of [place] Clinician visits:: 1/6 Session Start time: ***  Session End time: *** Total time: {IBH Total Time:21014050}  Type of Service: [place] Interpretor:No. Interpretor Name and Language: n/a   Warm Hand Off Completed.       SUBJECTIVE: Lindsay Ramirez is a 33 y.o. female accompanied by {CHL AMB ACCOMPANIED AJ:[id number]} Patient was referred by Darrol Poke, CNM for depression affecting pregnancy in second semester. Patient reports the following symptoms/concerns: *** Duration of problem: ***; Severity of problem: {Mild/Moderate/Severe:20260}  OBJECTIVE: Mood: {BHH MOOD:22306} and Affect: {BHH AFFECT:22307} Risk of harm to self or others: {CHL AMB BH Suicide Current Mental Status:21022748}  LIFE CONTEXT: Family and Social: *** School/Work: *** Self-Care: *** Life Changes: ***  GOALS ADDRESSED: Patient will: 1. Reduce symptoms of: {IBH Symptoms:21014056} 2. Increase knowledge and/or ability of: {IBH Patient Tools:21014057}  3. Demonstrate ability to: {IBH Goals:21014053}  INTERVENTIONS: Interventions utilized: {IBH Interventions:21014054}  Standardized Assessments completed: {IBH Screening Tools:21014051}  ASSESSMENT: Patient currently experiencing ***.   Patient may benefit from ***.  PLAN: 1. Follow up with behavioral health clinician on : *** 2. Behavioral recommendations: *** 3. Referral(s): {IBH Referrals:21014055} 4. "From scale of 1-10, how likely are you to follow plan?": ***  [name], LCSW  Depression screen [facility] 2/9 07/27/2014 05/04/2014  Decreased Interest 3 3  Down, Depressed, Hopeless 3 3  PHQ - 2 Score 6 6  Altered sleeping 3 3  Tired, decreased energy 3 3  Change in appetite 3 3  Feeling bad or failure about yourself  3 3  Trouble concentrating 3 3  Moving slowly or  fidgety/restless 3 1  Suicidal thoughts 1 0  PHQ-9 Score 25 22

## 2018-09-17 ENCOUNTER — Ambulatory Visit (HOSPITAL_COMMUNITY)
Admission: RE | Admit: 2018-09-17 | Discharge: 2018-09-17 | Disposition: A | Discharge status: Home / Self Care | Payer: Medicaid Other | Source: Ambulatory Visit | Attending: Obstetrics and Gynecology | Admitting: Obstetrics and Gynecology

## 2018-09-17 ENCOUNTER — Institutional Professional Consult (permissible substitution): Payer: Medicaid Other

## 2018-09-17 ENCOUNTER — Other Ambulatory Visit (HOSPITAL_COMMUNITY): Payer: Self-pay | Admitting: *Deleted

## 2018-09-17 DIAGNOSIS — Z362 Encounter for other antenatal screening follow-up: Secondary | ICD-10-CM | POA: Diagnosis present

## 2018-09-17 DIAGNOSIS — O09292 Supervision of pregnancy with other poor reproductive or obstetric history, second trimester: Secondary | ICD-10-CM

## 2018-09-17 DIAGNOSIS — O99212 Obesity complicating pregnancy, second trimester: Secondary | ICD-10-CM

## 2018-09-17 DIAGNOSIS — Z3A27 27 weeks gestation of pregnancy: Secondary | ICD-10-CM

## 2018-09-17 DIAGNOSIS — O09212 Supervision of pregnancy with history of pre-term labor, second trimester: Secondary | ICD-10-CM

## 2018-09-17 DIAGNOSIS — O99213 Obesity complicating pregnancy, third trimester: Secondary | ICD-10-CM

## 2018-09-23 ENCOUNTER — Other Ambulatory Visit: Payer: Medicaid Other

## 2018-09-23 ENCOUNTER — Encounter: Payer: Self-pay | Admitting: Certified Nurse Midwife

## 2018-09-23 ENCOUNTER — Ambulatory Visit (INDEPENDENT_AMBULATORY_CARE_PROVIDER_SITE_OTHER): Payer: Medicaid Other | Admitting: Certified Nurse Midwife

## 2018-09-23 VITALS — BP 101/66 | HR 71 | Wt 210.0 lb

## 2018-09-23 DIAGNOSIS — Z3483 Encounter for supervision of other normal pregnancy, third trimester: Secondary | ICD-10-CM

## 2018-09-23 DIAGNOSIS — O34219 Maternal care for unspecified type scar from previous cesarean delivery: Secondary | ICD-10-CM

## 2018-09-23 DIAGNOSIS — Z3A28 28 weeks gestation of pregnancy: Secondary | ICD-10-CM

## 2018-09-23 DIAGNOSIS — Z3480 Encounter for supervision of other normal pregnancy, unspecified trimester: Secondary | ICD-10-CM

## 2018-09-23 DIAGNOSIS — D573 Sickle-cell trait: Secondary | ICD-10-CM

## 2018-09-23 NOTE — Progress Notes (Signed)
   PRENATAL VISIT NOTE  Subjective:  Lindsay Ramirez is a 33 y.o. [phone] at [redacted]w[redacted]d being seen today for ongoing prenatal care.  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  Patient reports no complaints.  Contractions: Not present. Vag. Bleeding: None.  Movement: Present. Denies leaking of fluid.   The following portions of the patient's history were reviewed and updated as appropriate: allergies, current medications, past family history, past medical history, past social history, past surgical history and problem list. Problem list updated.  Objective:   Vitals:   09/23/18 0916  BP: 101/66  Pulse: 71  Weight: 210 lb (95.3 kg)    Fetal Status: Fetal Heart Rate (bpm): 147 Fundal Height: 32 cm Movement: Present     General:  Alert, oriented and cooperative. Patient is in no acute distress.  Skin: Skin is warm and dry. No rash noted.   Cardiovascular: Normal heart rate noted  Respiratory: Normal respiratory effort, no problems with respiration noted  Abdomen: Soft, gravid, appropriate for gestational age.  Pain/Pressure: Present     Pelvic: Cervical exam deferred        Extremities: Normal range of motion.  Edema: Trace  Mental Status: Normal mood and affect. Normal behavior. Normal judgment and thought content.   Assessment and Plan:  Pregnancy: [id number] at [redacted]w[redacted]d  1. Supervision of other normal pregnancy, antepartum - Patient doing well, no complaints - Anticipatory guidance on upcoming appointments - Educated and discussed GTT screening today and what to expect if results are abnormal vs normal  - Glucose Tolerance, 2 Hours w/1 Hour - HIV  Antibody (routine testing w rflx) - CBC - RPR  2. Previous cesarean delivery, antepartum - C/Sx1 in 2016 for IUGR twins in breech presentation of A&B  - Plans for TOLAC, consent signed today in office   3. Sickle cell trait ([facility]) - Culture, OB Urine  Preterm labor symptoms and general obstetric precautions including but not limited to vaginal bleeding, contractions, leaking of fluid and fetal movement were reviewed in detail with the patient. Please refer to After Visit Summary for other counseling recommendations.  Return in about 2 weeks (around 10/07/2018) for [place].  Future Appointments  Date Time Provider [place]  10/06/2018  9:00 AM Gavin Pound, CNM [facility] None  10/22/2018 10:15 AM [facility] [place] 4 WH-MFCUS MFC-US    Foy Mungia C Cledis Sohn, CNM

## 2018-09-23 NOTE — Patient Instructions (Signed)
Glucose Tolerance Test During Pregnancy Why am I having this test? The glucose tolerance test (GTT) is done to check how your body processes sugar (glucose). This is one of several tests used to diagnose diabetes that develops during pregnancy (gestational diabetes mellitus). Gestational diabetes is a temporary form of diabetes that some women develop during pregnancy. It usually occurs during the second trimester of pregnancy and goes away after delivery. Testing (screening) for gestational diabetes usually occurs between 24 and 28 weeks of pregnancy. You may have the GTT test after having a 1-hour glucose screening test if the results from that test indicate that you may have gestational diabetes. You may also have this test if:  You have a history of gestational diabetes.  You have a history of giving birth to very large babies or have experienced repeated fetal loss (stillbirth).  You have signs and symptoms of diabetes, such as: ? Changes in your vision. ? Tingling or numbness in your hands or feet. ? Changes in hunger, thirst, and urination that are not otherwise explained by your pregnancy. What is being tested? This test measures the amount of glucose in your blood at different times during a period of 3 hours. This indicates how well your body is able to process glucose. What kind of sample is taken?  Blood samples are required for this test. They are usually collected by inserting a needle into a blood vessel. How do I prepare for this test?  For 3 days before your test, eat normally. Have plenty of carbohydrate-rich foods.  Follow instructions from your health care provider about: ? Eating or drinking restrictions on the day of the test. You may be asked to not eat or drink anything other than water (fast) starting 8-10 hours before the test. ? Changing or stopping your regular medicines. Some medicines may interfere with this test. Tell a health care provider about:  All  medicines you are taking, including vitamins, herbs, eye drops, creams, and over-the-counter medicines.  Any blood disorders you have.  Any surgeries you have had.  Any medical conditions you have. What happens during the test? First, your blood glucose will be measured. This is referred to as your fasting blood glucose, since you fasted before the test. Then, you will drink a glucose solution that contains a certain amount of glucose. Your blood glucose will be measured again 1, 2, and 3 hours after drinking the solution. This test takes about 3 hours to complete. You will need to stay at the testing location during this time. During the testing period:  Do not eat or drink anything other than the glucose solution.  Do not exercise.  Do not use any products that contain nicotine or tobacco, such as cigarettes and e-cigarettes. If you need help stopping, ask your health care provider. The testing procedure may vary among health care providers and hospitals. How are the results reported? Your results will be reported as milligrams of glucose per deciliter of blood (mg/dL) or millimoles per liter (mmol/L). Your health care provider will compare your results to normal ranges that were established after testing a large group of people (reference ranges). Reference ranges may vary among labs and hospitals. For this test, common reference ranges are:  Fasting: less than 95-105 mg/dL (5.3-5.8 mmol/L).  1 hour after drinking glucose: less than 180-190 mg/dL (10.0-10.5 mmol/L).  2 hours after drinking glucose: less than 155-165 mg/dL (8.6-9.2 mmol/L).  3 hours after drinking glucose: 140-145 mg/dL (7.8-8.1 mmol/L). What do the   results mean? Results within reference ranges are considered normal, meaning that your glucose levels are well-controlled. If two or more of your blood glucose levels are high, you may be diagnosed with gestational diabetes. If only one level is high, your health care  provider may suggest repeat testing or other tests to confirm a diagnosis. Talk with your health care provider about what your results mean. Questions to ask your health care provider Ask your health care provider, or the department that is doing the test:  When will my results be ready?  How will I get my results?  What are my treatment options?  What other tests do I need?  What are my next steps? Summary  The glucose tolerance test (GTT) is one of several tests used to diagnose diabetes that develops during pregnancy (gestational diabetes mellitus). Gestational diabetes is a temporary form of diabetes that some women develop during pregnancy.  You may have the GTT test after having a 1-hour glucose screening test if the results from that test indicate that you may have gestational diabetes. You may also have this test if you have any symptoms or risk factors for gestational diabetes.  Talk with your health care provider about what your results mean. This information is not intended to replace advice given to you by your health care provider. Make sure you discuss any questions you have with your health care provider. Document Released: 02/24/2012 Document Revised: 04/06/2017 Document Reviewed: 04/06/2017 Elsevier Interactive Patient Education  2019 [employer]  

## 2018-09-24 LAB — CBC
Hematocrit: 31 % — ABNORMAL LOW (ref 34.0–46.6)
Hemoglobin: 10 g/dL — ABNORMAL LOW (ref 11.1–15.9)
MCH: 26.6 pg (ref 26.6–33.0)
MCHC: 32.3 g/dL (ref 31.5–35.7)
MCV: 82 fL (ref 79–97)
Platelets: 199 10*3/uL (ref 150–450)
RBC: 3.76 x10E6/uL — ABNORMAL LOW (ref 3.77–5.28)
RDW: 13.4 % (ref 11.7–15.4)
WBC: 7.5 10*3/uL (ref 3.4–10.8)

## 2018-09-24 LAB — GLUCOSE TOLERANCE, 2 HOURS W/ 1HR
Glucose, 1 hour: 105 mg/dL (ref 65–179)
Glucose, 2 hour: 91 mg/dL (ref 65–152)
Glucose, Fasting: 70 mg/dL (ref 65–91)

## 2018-09-24 LAB — RPR: RPR Ser Ql: NONREACTIVE

## 2018-09-24 LAB — HIV ANTIBODY (ROUTINE TESTING W REFLEX): HIV Screen 4th Generation wRfx: NONREACTIVE

## 2018-09-27 LAB — URINE CULTURE, OB REFLEX

## 2018-09-27 LAB — CULTURE, OB URINE

## 2018-10-06 ENCOUNTER — Other Ambulatory Visit: Payer: Self-pay

## 2018-10-06 ENCOUNTER — Ambulatory Visit (INDEPENDENT_AMBULATORY_CARE_PROVIDER_SITE_OTHER): Payer: Medicaid Other

## 2018-10-06 VITALS — BP 108/61 | HR 76 | Wt 215.0 lb

## 2018-10-06 DIAGNOSIS — O99343 Other mental disorders complicating pregnancy, third trimester: Secondary | ICD-10-CM

## 2018-10-06 DIAGNOSIS — O99012 Anemia complicating pregnancy, second trimester: Secondary | ICD-10-CM

## 2018-10-06 DIAGNOSIS — L739 Follicular disorder, unspecified: Secondary | ICD-10-CM

## 2018-10-06 DIAGNOSIS — Z3480 Encounter for supervision of other normal pregnancy, unspecified trimester: Secondary | ICD-10-CM

## 2018-10-06 DIAGNOSIS — Z3483 Encounter for supervision of other normal pregnancy, third trimester: Secondary | ICD-10-CM

## 2018-10-06 DIAGNOSIS — F329 Major depressive disorder, single episode, unspecified: Secondary | ICD-10-CM | POA: Insufficient documentation

## 2018-10-06 DIAGNOSIS — F32A Depression, unspecified: Secondary | ICD-10-CM | POA: Insufficient documentation

## 2018-10-06 DIAGNOSIS — O99342 Other mental disorders complicating pregnancy, second trimester: Secondary | ICD-10-CM

## 2018-10-06 DIAGNOSIS — O99013 Anemia complicating pregnancy, third trimester: Secondary | ICD-10-CM

## 2018-10-06 MED ORDER — FERROUS SULFATE 325 (65 FE) MG PO TABS: 325.0000 mg | ORAL_TABLET | Freq: Two times a day (BID) | ORAL | 3 refills | Status: DC

## 2018-10-06 NOTE — Progress Notes (Signed)
ROB.  C/o bump under right armpit, hurts 8/10

## 2018-10-06 NOTE — Progress Notes (Signed)
PRENATAL VISIT NOTE  Subjective:  Lindsay Ramirez is a 33 y.o. [phone] at [redacted]w[redacted]d being seen today for ongoing prenatal care.  She is currently monitored as a low-risk pregnancy with problems as listed below.  Patient reports improvement in depression since initiation of Zoloft, but has not had meds in one week due to lack of refill.  Patient also c/o "bump" under right arm that she noted 2 days ago and is painful to touch.  Patient reports intermittent cramping, but is unsure of if it is contractions.  She endorses fetal movement and denies vaginal concerns including discharge, bleeding, leaking, itching, and burning.   Patient Active Problem List   Diagnosis Date Noted  . Depression affecting pregnancy in second trimester, antepartum 10/06/2018  . Sickle cell trait ([place]) 06/17/2018  . Supervision of other normal pregnancy, antepartum 05/20/2018  . Previous preterm delivery, antepartum 05/20/2018  . Previous cesarean delivery, antepartum 09/19/2014  . Migraine without aura 03/25/2014  . Gout 06/15/2013  . Bipolar disorder, unspecified/depression 11/11/2012  . Anemia affecting pregnancy in second trimester 09/28/2012  . 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 Acid reflux 06/21/2012    The following portions of the patient's history were reviewed and updated as appropriate: allergies, current medications, past family history, past medical history, past social history, past surgical history and problem list. Problem list updated.  Objective:   Vitals:   10/06/18 0851  BP: 108/61  Pulse: 76  Weight: 215 lb (97.5 kg)    Fetal Status: Fetal Heart Rate (bpm): 143 Fundal Height: 33 cm Movement: Present     General:  Alert, oriented and cooperative. Patient is in no acute distress.  Skin: Skin is warm and dry. No rash noted.  Right Axilla: Nodule palpated ~1x1cm, tender to touch. No apparent pustule, but fluid noted from area with compression.   Cardiovascular: Normal heart rate  noted  Respiratory: Normal respiratory effort, no problems with respiration noted  Abdomen: Soft, gravid, appropriate for gestational age.  Pain/Pressure: Absent     Pelvic: Cervical exam deferred        Extremities: Normal range of motion.  Edema: None  Mental Status: Normal mood and affect. Normal behavior. Normal judgment and thought content.   Assessment and Plan:  Pregnancy: [id number] at [redacted]w[redacted]d  1. Supervision of other normal pregnancy, antepartum Discussed usage of maternity support belt (ordered 08/23/18) for cramping/pelvic discomfort. Educated on proper body mechanics and tylenol for pharmacological relief of symptoms Encouraged adequate hydration and rest Reviewed 28 week labs and normal findings   2. Anemia affecting pregnancy in second trimester Reviewed 28 week labs and abnormal findings Rx for Fe Supplementation sent to pharmacy on File Instructed to take BID with Vit C rich foods or juices  Discussed possible side effects including change in stool color and/or constipation  3. Depression affecting pregnancy in second trimester, antepartum Instructed to contact pharmacy regarding refills on Zoloft Endorses feelings of personal safety and safety at home  4. Folliculitis of Right Axilla Educated on relief measures including warm compresses Instructed to defer shaving Tylenol usage for pain Encouraged to report lack of improvement or worsening of symptoms  Preterm labor symptoms and general obstetric precautions including but not limited to vaginal bleeding, contractions, leaking of fluid and fetal movement were reviewed in detail with the patient. Please refer to After Visit Summary for other counseling recommendations.  Return in about 2 weeks (around 10/20/2018) for [place].  Future Appointments  Date Time Provider [place]  10/22/2018  10:15 AM [facility] [place], CNM  10/06/2018 11:29 AM

## 2018-10-06 NOTE — Patient Instructions (Addendum)
Folliculitis  Folliculitis is inflammation of the hair follicles. Folliculitis most commonly occurs on the scalp, thighs, legs, back, and buttocks. However, it can occur anywhere on the body. What are the causes? This condition may be caused by:  A bacterial infection (common).  A fungal infection.  A viral infection.  Coming into contact with certain chemicals, especially oils and tars.  Shaving or waxing.  Applying greasy ointments or creams to your skin often. Long-lasting folliculitis and folliculitis that keeps coming back can be caused by bacteria that live in the nostrils. What increases the risk? This condition is more likely to develop in people with:  A weakened immune system.  Diabetes.  Obesity. What are the signs or symptoms? Symptoms of this condition include:  Redness.  Soreness.  Swelling.  Itching.  Small white or yellow, pus-filled, itchy spots (pustules) that appear over a reddened area. If there is an infection that goes deep into the follicle, these may develop into a boil (furuncle).  A group of closely packed boils (carbuncle). These tend to form in hairy, sweaty areas of the body. How is this diagnosed? This condition is diagnosed with a skin exam. To find what is causing the condition, your health care provider may take a sample of one of the pustules or boils for testing. How is this treated? This condition may be treated by:  Applying warm compresses to the affected areas.  Taking an antibiotic medicine or applying an antibiotic medicine to the skin.  Applying or bathing with an antiseptic solution.  Taking an over-the-counter medicine to help with itching.  Having a procedure to drain any pustules or boils. This may be done if a pustule or boil contains a lot of pus or fluid.  Laser hair removal. This may be done to treat long-lasting folliculitis. Follow these instructions at home:  If directed, apply heat to the affected area as  often as told by your health care provider. Use the heat source that your health care provider recommends, such as a moist heat pack or a heating pad. ? Place a towel between your skin and the heat source. ? Leave the heat on for 20-30 minutes. ? Remove the heat if your skin turns bright red. This is especially important if you are unable to feel pain, heat, or cold. You may have a greater risk of getting burned.  If you were prescribed an antibiotic medicine, use it as told by your health care provider. Do not stop using the antibiotic even if you start to feel better.  Take over-the-counter and prescription medicines only as told by your health care provider.  Do not shave irritated skin.  Keep all follow-up visits as told by your health care provider. This is important. Get help right away if:  You have more redness, swelling, or pain in the affected area.  Red streaks are spreading from the affected area.  You have a fever. This information is not intended to replace advice given to you by your health care provider. Make sure you discuss any questions you have with your health care provider. Document Released: 11/03/2001 Document Revised: 03/14/2016 Document Reviewed: 06/15/2015 Elsevier Interactive Patient Education  2019 [employer] Iron-Rich Diet  Iron is a mineral that helps your body to produce hemoglobin. Hemoglobin is a protein in red blood cells that carries oxygen to your body's tissues. Eating too little iron may cause you to feel weak and tired, and it can increase your risk of infection. Iron is  naturally found in many foods, and many foods have iron added to them (iron-fortified foods). You may need to follow an iron-rich diet if you do not have enough iron in your body due to certain medical conditions. The amount of iron that you need each day depends on your age, your sex, and any medical conditions you have. Follow instructions from your health care provider or a diet  and nutrition specialist (dietitian) about how much iron you should eat each day. What are tips for following this plan? Reading food labels  Check food labels to see how many milligrams (mg) of iron are in each serving. Cooking  Cook foods in pots and pans that are made from iron.  Take these steps to make it easier for your body to absorb iron from certain foods: ? Soak beans overnight before cooking. ? Soak whole grains overnight and drain them before using. ? Ferment flours before baking, such as by using yeast in bread dough. Meal planning  When you eat foods that contain iron, you should eat them with foods that are high in vitamin C. These include oranges, peppers, tomatoes, potatoes, and mango. Vitamin C helps your body to absorb iron. General information  Take iron supplements only as told by your health care provider. An overdose of iron can be life-threatening. If you were prescribed iron supplements, take them with orange juice or a vitamin C supplement.  When you eat iron-fortified foods or take an iron supplement, you should also eat foods that naturally contain iron, such as meat, poultry, and fish. Eating naturally iron-rich foods helps your body to absorb the iron that is added to other foods or contained in a supplement.  Certain foods and drinks prevent your body from absorbing iron properly. Avoid eating these foods in the same meal as iron-rich foods or with iron supplements. These foods include: ? Coffee, black tea, and red wine. ? Milk, dairy products, and foods that are high in calcium. ? Beans and soybeans. ? Whole grains. What foods should I eat? Fruits Prunes. Raisins. Eat fruits high in vitamin C, such as oranges, grapefruits, and strawberries, alongside iron-rich foods. Vegetables Spinach (cooked). Green peas. Broccoli. Fermented vegetables. Eat vegetables high in vitamin C, such as leafy greens, potatoes, bell peppers, and tomatoes, alongside iron-rich  foods. Grains Iron-fortified breakfast cereal. Iron-fortified whole-wheat bread. Enriched rice. Sprouted grains. Meats and other proteins Beef liver. Oysters. Beef. Shrimp. [place]. Chicken. [place]. Sardines. Chickpeas. Nuts. Tofu. Pumpkin seeds. Beverages Tomato juice. Fresh orange juice. Prune juice. Hibiscus tea. Fortified instant breakfast shakes. Sweets and desserts Blackstrap molasses. Seasonings and condiments Tahini. Fermented soy sauce. Other foods Wheat germ. The items listed above may not be a complete list of recommended foods and beverages. Contact a dietitian for more information. What foods should I avoid? Grains Whole grains. Bran cereal. Bran flour. Oats. Meats and other proteins Soybeans. Products made from soy protein. Black beans. Lentils. Mung beans. Split peas. Dairy Milk. Cream. Cheese. Yogurt. Cottage cheese. Beverages Coffee. Black tea. Red wine. Sweets and desserts Cocoa. Chocolate. Ice cream. Other foods Basil. Oregano. Large amounts of parsley. The items listed above may not be a complete list of foods and beverages to avoid. Contact a dietitian for more information. Summary  Iron is a mineral that helps your body to produce hemoglobin. Hemoglobin is a protein in red blood cells that carries oxygen to your body's tissues.  Iron is naturally found in many foods, and many foods have iron added to them (  iron-fortified foods).  When you eat foods that contain iron, you should eat them with foods that are high in vitamin C. Vitamin C helps your body to absorb iron.  Certain foods and drinks prevent your body from absorbing iron properly, such as whole grains and dairy products. You should avoid eating these foods in the same meal as iron-rich foods or with iron supplements. This information is not intended to replace advice given to you by your health care provider. Make sure you discuss any questions you have with your health care provider. Document  Released: 04/08/2005 Document Revised: 07/21/2017 Document Reviewed: 07/21/2017 Elsevier Interactive Patient Education  2019 [employer].

## 2018-10-12 ENCOUNTER — Telehealth: Payer: Self-pay

## 2018-10-12 NOTE — Telephone Encounter (Signed)
TC from pt to requesting pain medication for back pain. Pt states she is currently taking tylenol and using support belt and still no relief. I advised pt on pregnancy exercise and stretching pt states she is in to much pain to attempt. Pt advised her request would be made known to a provider.

## 2018-10-12 NOTE — Telephone Encounter (Signed)
Return call to pt  Pt called again regarding medication request Pt informed for Rx request it can be 24-48 hrs. Pt states she is still having pain and now experiencing headache   I asked pt was she having contractions? If they were 3-5 mins apart for 1 min lasting 1 hr she needed to go to MAU. Pt stated she does not know how contractions feel.  I advised pt if her sx's were worsening she could go to the hospital for an evaluation.

## 2018-10-13 ENCOUNTER — Other Ambulatory Visit: Payer: Self-pay | Admitting: Obstetrics and Gynecology

## 2018-10-13 MED ORDER — CYCLOBENZAPRINE HCL 10 MG PO TABS: 10.0000 mg | ORAL_TABLET | Freq: Three times a day (TID) | ORAL | 2 refills | Status: DC | PRN

## 2018-10-14 ENCOUNTER — Encounter: Payer: Self-pay | Admitting: Obstetrics and Gynecology

## 2018-10-14 ENCOUNTER — Ambulatory Visit (INDEPENDENT_AMBULATORY_CARE_PROVIDER_SITE_OTHER): Payer: Medicaid Other | Admitting: Obstetrics and Gynecology

## 2018-10-14 VITALS — BP 107/72 | HR 71 | Wt 209.5 lb

## 2018-10-14 DIAGNOSIS — Z3483 Encounter for supervision of other normal pregnancy, third trimester: Secondary | ICD-10-CM

## 2018-10-14 DIAGNOSIS — O99013 Anemia complicating pregnancy, third trimester: Secondary | ICD-10-CM

## 2018-10-14 DIAGNOSIS — D573 Sickle-cell trait: Secondary | ICD-10-CM

## 2018-10-14 DIAGNOSIS — G44229 Chronic tension-type headache, not intractable: Secondary | ICD-10-CM

## 2018-10-14 DIAGNOSIS — O09219 Supervision of pregnancy with history of pre-term labor, unspecified trimester: Secondary | ICD-10-CM

## 2018-10-14 DIAGNOSIS — R300 Dysuria: Secondary | ICD-10-CM

## 2018-10-14 DIAGNOSIS — Z3480 Encounter for supervision of other normal pregnancy, unspecified trimester: Secondary | ICD-10-CM

## 2018-10-14 DIAGNOSIS — O99012 Anemia complicating pregnancy, second trimester: Secondary | ICD-10-CM

## 2018-10-14 DIAGNOSIS — O09213 Supervision of pregnancy with history of pre-term labor, third trimester: Secondary | ICD-10-CM

## 2018-10-14 DIAGNOSIS — O34219 Maternal care for unspecified type scar from previous cesarean delivery: Secondary | ICD-10-CM

## 2018-10-14 NOTE — Progress Notes (Signed)
Pt is here for ROB and hypertension labs. [redacted]w[redacted]d. Pt has c/o HA for 4 days. Pt states HA does not improve with tylenol.

## 2018-10-14 NOTE — Progress Notes (Addendum)
PRENATAL VISIT NOTE  Subjective:  Lindsay Ramirez is a 32 y.o. G7P3216 at [redacted]w[redacted]d being seen today for ongoing prenatal care.  She is currently monitored for the following issues for this high-risk pregnancy and has Acid reflux; Hemoglobin A-S genotype (HCC); Anemia affecting pregnancy in second trimester; Bipolar disorder, unspecified/depression; Migraine without aura; Gout; Previous cesarean delivery, antepartum; Supervision of other normal pregnancy, antepartum; Previous preterm delivery, antepartum; Sickle cell trait (HCC); and Depression affecting pregnancy in second trimester, antepartum on their problem list.  Patient reports headache for four days now, taking tylenol without relief. Has had vomiting as well. Has paid at top of fundus, then also has pain in suprapubic area. Intermittent suprapubic pain that means she needs "ball up." Contractions: Not present. Vag. Bleeding: None.  Movement: Present. Denies leaking of fluid.   The following portions of the patient's history were reviewed and updated as appropriate: allergies, current medications, past family history, past medical history, past social history, past surgical history and problem list. Problem list updated.  Objective:   Vitals:   10/14/18 0932  BP: 107/72  Pulse: 71  Weight: 209 lb 8 oz (95 kg)    Fetal Status: Fetal Heart Rate (bpm): 160   Movement: Present     General:  Alert, oriented and cooperative. Patient is in no acute distress.  Skin: Skin is warm and dry. No rash noted.   Cardiovascular: Normal heart rate noted  Respiratory: Normal respiratory effort, no problems with respiration noted  Abdomen: Soft, gravid, appropriate for gestational age.  Pain/Pressure: Absent     Pelvic: Cervical exam deferred        Extremities: Normal range of motion.  Edema: None  Mental Status: Normal mood and affect. Normal behavior. Normal judgment and thought content.   Assessment and Plan:  Pregnancy: G7P3216 at  [redacted]w[redacted]d  1. Supervision of other normal pregnancy, antepartum  2. Previous preterm delivery, antepartum SOL 36 weeks  3. Previous cesarean delivery, antepartum Desires TOLAC  4. Anemia affecting pregnancy in second trimester  5. Sickle cell trait (HCC) Urine Q trim  6. Chronic tension-type headache, not intractable Day 4 of headache, has also been vomiting. Reviewed importance of drinking plenty of water and eating regularly. Tylenol prn - CBC - Comp Met (CMET) - Protein / creatinine ratio, urine  7. Dysuria - Urine Culture   Preterm labor symptoms and general obstetric precautions including but not limited to vaginal bleeding, contractions, leaking of fluid and fetal movement were reviewed in detail with the patient. Please refer to After Visit Summary for other counseling recommendations.  Return in about 2 weeks (around 10/28/2018) for keep appt for one week, OB visit.  Future Appointments  Date Time Provider Department Center  10/21/2018  9:45 AM Rogers, Veronica C, CNM [facility] None  10/22/2018 10:15 AM [facility] US 4 [facility]    Kelly M Davis, MD  

## 2018-10-15 LAB — COMPREHENSIVE METABOLIC PANEL
ALBUMIN: 3.3 g/dL — AB (ref 3.8–4.8)
ALK PHOS: 72 IU/L (ref 39–117)
ALT: 7 IU/L (ref 0–32)
AST: 10 IU/L (ref 0–40)
Albumin/Globulin Ratio: 1.2 (ref 1.2–2.2)
BUN / CREAT RATIO: 7 — AB (ref 9–23)
BUN: 4 mg/dL — AB (ref 6–20)
Bilirubin Total: 0.3 mg/dL (ref 0.0–1.2)
CO2: 18 mmol/L — AB (ref 20–29)
CREATININE: 0.59 mg/dL (ref 0.57–1.00)
Calcium: 8.8 mg/dL (ref 8.7–10.2)
Chloride: 107 mmol/L — ABNORMAL HIGH (ref 96–106)
GFR, EST AFRICAN AMERICAN: 141 mL/min/{1.73_m2} (ref >59)
GFR, EST NON AFRICAN AMERICAN: 123 mL/min/{1.73_m2} (ref >59)
GLOBULIN, TOTAL: 2.8 g/dL (ref 1.5–4.5)
GLUCOSE: 76 mg/dL (ref 65–99)
Potassium: 3.4 mmol/L — ABNORMAL LOW (ref 3.5–5.2)
SODIUM: 140 mmol/L (ref 134–144)
TOTAL PROTEIN: 6.1 g/dL (ref 6.0–8.5)

## 2018-10-15 LAB — CBC
HEMATOCRIT: 31.3 % — AB (ref 34.0–46.6)
HEMOGLOBIN: 10.4 g/dL — AB (ref 11.1–15.9)
MCH: 27 pg (ref 26.6–33.0)
MCHC: 33.2 g/dL (ref 31.5–35.7)
MCV: 81 fL (ref 79–97)
Platelets: 205 10*3/uL (ref 150–450)
RBC: 3.85 x10E6/uL (ref 3.77–5.28)
RDW: 13.8 % (ref 11.7–15.4)
WBC: 9.3 10*3/uL (ref 3.4–10.8)

## 2018-10-15 LAB — PROTEIN / CREATININE RATIO, URINE
Creatinine, Urine: 141.6 mg/dL
Protein, Ur: 13.9 mg/dL
Protein/Creat Ratio: 98 mg/g creat (ref 0–200)

## 2018-10-16 LAB — URINE CULTURE

## 2018-10-21 ENCOUNTER — Encounter: Payer: Self-pay | Admitting: Certified Nurse Midwife

## 2018-10-21 ENCOUNTER — Ambulatory Visit (INDEPENDENT_AMBULATORY_CARE_PROVIDER_SITE_OTHER): Payer: Medicaid Other | Admitting: Certified Nurse Midwife

## 2018-10-21 VITALS — BP 115/69 | HR 82 | Wt 212.7 lb

## 2018-10-21 DIAGNOSIS — O99012 Anemia complicating pregnancy, second trimester: Secondary | ICD-10-CM

## 2018-10-21 DIAGNOSIS — O34219 Maternal care for unspecified type scar from previous cesarean delivery: Secondary | ICD-10-CM

## 2018-10-21 DIAGNOSIS — Z3480 Encounter for supervision of other normal pregnancy, unspecified trimester: Secondary | ICD-10-CM

## 2018-10-21 DIAGNOSIS — Z3A32 32 weeks gestation of pregnancy: Secondary | ICD-10-CM

## 2018-10-21 DIAGNOSIS — Z3483 Encounter for supervision of other normal pregnancy, third trimester: Secondary | ICD-10-CM

## 2018-10-21 DIAGNOSIS — O99013 Anemia complicating pregnancy, third trimester: Secondary | ICD-10-CM

## 2018-10-21 DIAGNOSIS — D573 Sickle-cell trait: Secondary | ICD-10-CM

## 2018-10-21 NOTE — Patient Instructions (Signed)

## 2018-10-21 NOTE — Progress Notes (Signed)
   PRENATAL VISIT NOTE  Subjective:  Lindsay Ramirez is a 33 y.o. [phone] at [redacted]w[redacted]d being seen today for ongoing prenatal care.  She is currently monitored for the following issues for this low-risk pregnancy and has Acid reflux; Hemoglobin A-S genotype ([place]); Anemia affecting pregnancy in second trimester; Bipolar disorder, unspecified/depression; Migraine without aura; Gout; Previous cesarean delivery, antepartum; Supervision of other normal pregnancy, antepartum; Previous preterm delivery, antepartum; Sickle cell trait ([place]); and Depression affecting pregnancy in second trimester, antepartum on their problem list.  Patient reports no complaints.  Contractions: Not present. Vag. Bleeding: None.  Movement: Present. Denies leaking of fluid.   The following portions of the patient's history were reviewed and updated as appropriate: allergies, current medications, past family history, past medical history, past social history, past surgical history and problem list. Problem list updated.  Objective:   Vitals:   10/21/18 0958  BP: 115/69  Pulse: 82  Weight: 212 lb 11.2 oz (96.5 kg)    Fetal Status: Fetal Heart Rate (bpm): 138 Fundal Height: 36 cm Movement: Present     General:  Alert, oriented and cooperative. Patient is in no acute distress.  Skin: Skin is warm and dry. No rash noted.   Cardiovascular: Normal heart rate noted  Respiratory: Normal respiratory effort, no problems with respiration noted  Abdomen: Soft, gravid, appropriate for gestational age.  Pain/Pressure: Absent     Pelvic: Cervical exam deferred        Extremities: Normal range of motion.  Edema: None  Mental Status: Normal mood and affect. Normal behavior. Normal judgment and thought content.   Assessment and Plan:  Pregnancy: H6P5916 at [redacted]w[redacted]d  1. Supervision of other normal pregnancy, antepartum - Patient doing well, no complaints - Anticipatory guidance on upcoming appointments - Follow up US scheduled for  tomorrow   2. Sickle cell trait (HCC) - Urine culture completed last visit negative   3. Anemia affecting pregnancy in second trimester - hgb completed last visit 10.4  4. Previous cesarean delivery, antepartum - Plans TOLAC, consent signed   Preterm labor symptoms and general obstetric precautions including but not limited to vaginal bleeding, contractions, leaking of fluid and fetal movement were reviewed in detail with the patient. Please refer to After Visit Summary for other counseling recommendations.  Return in about 2 weeks (around 11/04/2018).  Future Appointments  Date Time Provider [place]  10/22/2018 10:15 AM [place] [facility]  11/05/2018  9:30 AM Luvenia Redden, PA-C [facility] None    Lajean Manes, CNM

## 2018-10-22 ENCOUNTER — Ambulatory Visit (HOSPITAL_COMMUNITY)
Admission: RE | Admit: 2018-10-22 | Discharge: 2018-10-22 | Disposition: A | Discharge status: Home / Self Care | Payer: Medicaid Other | Source: Ambulatory Visit | Attending: Obstetrics and Gynecology | Admitting: Obstetrics and Gynecology

## 2018-10-22 ENCOUNTER — Other Ambulatory Visit (HOSPITAL_COMMUNITY): Payer: Self-pay | Admitting: *Deleted

## 2018-10-22 DIAGNOSIS — Z3A32 32 weeks gestation of pregnancy: Secondary | ICD-10-CM

## 2018-10-22 DIAGNOSIS — O09213 Supervision of pregnancy with history of pre-term labor, third trimester: Secondary | ICD-10-CM

## 2018-10-22 DIAGNOSIS — O09293 Supervision of pregnancy with other poor reproductive or obstetric history, third trimester: Secondary | ICD-10-CM

## 2018-10-22 DIAGNOSIS — Z362 Encounter for other antenatal screening follow-up: Secondary | ICD-10-CM

## 2018-10-22 DIAGNOSIS — O99213 Obesity complicating pregnancy, third trimester: Secondary | ICD-10-CM | POA: Insufficient documentation

## 2018-10-22 DIAGNOSIS — Z8759 Personal history of other complications of pregnancy, childbirth and the puerperium: Secondary | ICD-10-CM

## 2018-10-25 ENCOUNTER — Encounter (HOSPITAL_COMMUNITY): Payer: Self-pay

## 2018-10-25 ENCOUNTER — Inpatient Hospital Stay (HOSPITAL_COMMUNITY)
Admission: AD | Admit: 2018-10-25 | Discharge: 2018-10-26 | Disposition: A | Discharge status: Home / Self Care | Payer: Medicaid Other | Attending: Obstetrics & Gynecology | Admitting: Obstetrics & Gynecology

## 2018-10-25 DIAGNOSIS — W19XXXA Unspecified fall, initial encounter: Secondary | ICD-10-CM

## 2018-10-25 DIAGNOSIS — O09219 Supervision of pregnancy with history of pre-term labor, unspecified trimester: Secondary | ICD-10-CM

## 2018-10-25 DIAGNOSIS — Z3A33 33 weeks gestation of pregnancy: Secondary | ICD-10-CM | POA: Diagnosis not present

## 2018-10-25 DIAGNOSIS — D573 Sickle-cell trait: Secondary | ICD-10-CM

## 2018-10-25 DIAGNOSIS — O9A213 Injury, poisoning and certain other consequences of external causes complicating pregnancy, third trimester: Secondary | ICD-10-CM | POA: Diagnosis not present

## 2018-10-25 DIAGNOSIS — O09213 Supervision of pregnancy with history of pre-term labor, third trimester: Secondary | ICD-10-CM | POA: Diagnosis not present

## 2018-10-25 DIAGNOSIS — W01198A Fall on same level from slipping, tripping and stumbling with subsequent striking against other object, initial encounter: Secondary | ICD-10-CM | POA: Insufficient documentation

## 2018-10-25 DIAGNOSIS — Z3A32 32 weeks gestation of pregnancy: Secondary | ICD-10-CM | POA: Diagnosis not present

## 2018-10-25 DIAGNOSIS — O26893 Other specified pregnancy related conditions, third trimester: Secondary | ICD-10-CM | POA: Insufficient documentation

## 2018-10-25 DIAGNOSIS — Z3689 Encounter for other specified antenatal screening: Secondary | ICD-10-CM

## 2018-10-25 MED ORDER — OXYCODONE-ACETAMINOPHEN 5-325 MG PO TABS
2.0000 | ORAL_TABLET | Freq: Once | ORAL | Status: AC
  Administered 2018-10-25: 2 via ORAL
  Filled 2018-10-25: qty 2

## 2018-10-25 NOTE — MAU Note (Signed)
Urine in lab 

## 2018-10-25 NOTE — MAU Provider Note (Signed)
History     CSN: [id number]  Arrival date and time: 10/25/18 2129   First Provider Initiated Contact with Patient 10/25/18 2205      Chief Complaint  Patient presents with  . Fall   HPI Lindsay Ramirez is a 33 y.o. [phone] at [redacted]w[redacted]d who presents to MAU for initial evaluation after she fell at about 9pm today. Patient states she spilled cooking oil in her kitchen, slipped and fell onto her tailbone on the kitchen floor and lightly bumped her head on the door of the cupboard behind her. She denies vaginal bleeding, leaking of fluid, decreased fetal movement, fever or recent illness.    Patient complains of a moderate headache. Her pain is located in her occipital lobe and does not radiate. She denies aggravating or alleviating factors.   OB History    Gravida  7   Para  5   Term  3   Preterm  2   AB  1   Living  6     SAB  1   TAB  0   Ectopic  0   Multiple  1   Live Births  6           Past Medical History:  Diagnosis Date  . Abnormal Pap smear    LAST PAP 06/2011  . Anemia    CHRONIC  . Asthma    rarely uses inhaler,  [facility]  . Bipolar affective disorder ([place])   . Bipolar affective disorder ([place]) 2006   TOOK MEDS X 3 YR  . Depression    PP 2013  . Gout    feet - no meds  . Headache(784.0)    otc med prn  . Infection    Hx -UTI  . Mental disorder   . Missed ab 10/2013  . Sickle cell trait ([place])   . SVD (spontaneous vaginal delivery)    x 4  . Vaginal Pap smear, abnormal     Past Surgical History:  Procedure Laterality Date  . CESAREAN SECTION    . CESAREAN SECTION MULTI-GESTATIONAL N/A 09/19/2014   Procedure: CESAREAN SECTION MULTI-GESTATIONAL;  Surgeon: Shelly Bombard, MD;  Location: [facility];  Service: Obstetrics;  Laterality: N/A;  . DILATION AND CURETTAGE OF UTERUS    . DILATION AND EVACUATION N/A 11/04/2013   Procedure: DILATATION AND EVACUATION;  Surgeon: Alwyn Pea, MD;  Location: [facility];  Service: Gynecology;   Laterality: N/A;  . MOUTH SURGERY     wisdom teeth ext  . VAGINAL DELIVERY     x 4  . WISDOM TOOTH EXTRACTION  2012    Family History  Problem Relation Age of Onset  . Hypertension Mother   . Asthma Mother   . Heart disease Mother   . Hyperlipidemia Mother   . Thyroid disease Mother   . Alcohol abuse Father   . Cancer Maternal Grandmother 37       COLON  . Depression Maternal Grandmother   . Alcohol abuse Maternal Grandmother   . Drug abuse Maternal Grandmother   . Alcohol abuse Maternal Grandfather   . Drug abuse Maternal Grandfather   . Stroke Maternal Grandfather   . Alcohol abuse Paternal Grandfather   . Drug abuse Paternal Grandfather   . Asthma Sister   . Depression Sister   . Diabetes Maternal Aunt   . HIV Maternal Aunt   . Asthma Sister     Social History   Tobacco Use  . Smoking  status: Never Smoker  . Smokeless tobacco: Never Used  Substance Use Topics  . Alcohol use: No    Alcohol/week: 0.0 standard drinks  . Drug use: No    Allergies: No Known Allergies  Medications Prior to Admission  Medication Sig Dispense Refill Last Dose  . ondansetron (ZOFRAN ODT) 4 MG disintegrating tablet Take 1 tablet (4 mg total) by mouth every 6 (six) hours as needed for nausea. 20 tablet 5 10/25/2018 at Unknown time  . acetaminophen (TYLENOL) 500 MG tablet Take 500 mg by mouth every 6 (six) hours as needed.   Taking    Review of Systems  Constitutional: Negative for chills, fatigue and fever.  Respiratory: Negative for shortness of breath.   Gastrointestinal: Negative for abdominal pain.  Genitourinary: Negative for vaginal bleeding, vaginal discharge and vaginal pain.  Musculoskeletal: Negative for back pain.  Neurological: Positive for headaches. Negative for dizziness, seizures, syncope, weakness, light-headedness and numbness.  All other systems reviewed and are negative.  Physical Exam   Blood pressure (!) 102/56, pulse 86, temperature 98.3 F (36.8 C),  temperature source Oral, resp. rate 18, height 5\' 2"  (1.575 m), weight 98 kg, last menstrual period 03/09/2018, SpO2 98 %.  Physical Exam  Nursing note and vitals reviewed. Constitutional: She is oriented to person, place, and time. She appears well-developed and well-nourished.  Cardiovascular: Normal rate.  Respiratory: Effort normal.  GI: Soft. Bowel sounds are normal. She exhibits no distension. There is no abdominal tenderness. There is no rebound, no guarding and no CVA tenderness.  Gravid  Genitourinary:    Vagina normal.     No vaginal discharge.   Musculoskeletal: Normal range of motion.  Neurological: She is alert and oriented to person, place, and time. She has normal strength. No cranial nerve deficit or sensory deficit. Coordination and gait normal.  Skin: Skin is warm and dry.  Psychiatric: She has a normal mood and affect. Her behavior is normal. Judgment and thought content normal.    MAU Course/MDM  Procedures  --closed cervix --Reactive tracing: baseline 135, moderate variability, positive accels, no decels --[place]: rare contractions, not felt by patient --No bleeding or abdominal tenderness at any time during evaluation in MAU  Patient Vitals for the past 24 hrs:  BP Temp Temp src Pulse Resp SpO2 Height Weight  10/25/18 2141 (!) 102/56 98.3 F (36.8 C) Oral 86 18 98 % 5\' 2"  (1.575 m) 98 kg      Assessment and Plan  --33 y.o. [id number] at [redacted]w[redacted]d initial encounter for fall without abdominal trauma --S/p 4 hours continuous monitoring, reactive tracing --Discharge home in stable condition  F/U: [facility] 11/05/2018  Darlina Rumpf, CNM 10/26/2018, 1:59 AM

## 2018-10-25 NOTE — MAU Note (Addendum)
Pt slipped in oil in kitchen.  Landed on backside and hit head against cupboard. Has headache.  Denies cntx, bleeding or LOF. May be some decreased FM.

## 2018-10-26 ENCOUNTER — Telehealth: Payer: Self-pay

## 2018-10-26 DIAGNOSIS — D573 Sickle-cell trait: Secondary | ICD-10-CM

## 2018-10-26 DIAGNOSIS — W19XXXA Unspecified fall, initial encounter: Secondary | ICD-10-CM

## 2018-10-26 DIAGNOSIS — Z3A33 33 weeks gestation of pregnancy: Secondary | ICD-10-CM

## 2018-10-26 DIAGNOSIS — O09213 Supervision of pregnancy with history of pre-term labor, third trimester: Secondary | ICD-10-CM

## 2018-10-26 DIAGNOSIS — O9A213 Injury, poisoning and certain other consequences of external causes complicating pregnancy, third trimester: Secondary | ICD-10-CM

## 2018-10-26 NOTE — Telephone Encounter (Signed)
Pt called and states that she is not feeling her baby move as much as she has previously and she is still in "constant pain" from the fall she had yesterday. States pain is located in her bottom, back, and back of her head and describes the pain as "constant and will not go away, especially when moving around". Pt states she has tried taking tylenol and it is not helping at all. I advised pt that because of her baby not moving as much, she should go be evaluated at the hospital for that and for the pain. Pt verbalized understanding.

## 2018-10-26 NOTE — Discharge Instructions (Signed)

## 2018-10-27 ENCOUNTER — Telehealth: Payer: Self-pay

## 2018-10-27 NOTE — Telephone Encounter (Signed)
Patient called back asking for pain medication, I advised her yesterday that she needed to go to the hospital for c/o her baby not moving as much and severe pain not alleviated by tylenol. Pt verbalized she did not go after we spoke. I again advised her to go to the hospital or make an appointment to come here if she is not feeling baby move as much or is in pain not alleviated by tylenol and rest and advised that I could not send her narcotic pain medication over the phone.Pt declined appointment and Pt states "Fine, I will just lay here in pain then." and hung up the phone.

## 2018-11-05 ENCOUNTER — Encounter: Payer: Medicaid Other | Admitting: Medical

## 2018-11-08 ENCOUNTER — Telehealth: Payer: Self-pay | Admitting: Medical

## 2018-11-08 NOTE — Telephone Encounter (Signed)
I called to rescheduled Lindsay Ramirez's missed appointment and she stated she did not want to rescheduled because we did not listen to her or do anything for her pain when she calls. I suggested she reschedule appointment and come in and talk with the doctor face to face. She stated that would not do any good and she would call back if she changes her mind.

## 2018-11-11 ENCOUNTER — Other Ambulatory Visit: Payer: Self-pay

## 2018-11-11 ENCOUNTER — Inpatient Hospital Stay (HOSPITAL_COMMUNITY)
Admission: AD | Admit: 2018-11-11 | Discharge: 2018-11-11 | Disposition: A | Discharge status: Home / Self Care | Payer: Medicaid Other | Attending: Obstetrics and Gynecology | Admitting: Obstetrics and Gynecology

## 2018-11-11 ENCOUNTER — Encounter (HOSPITAL_COMMUNITY): Payer: Self-pay | Admitting: *Deleted

## 2018-11-11 DIAGNOSIS — Z3A35 35 weeks gestation of pregnancy: Secondary | ICD-10-CM

## 2018-11-11 DIAGNOSIS — N760 Acute vaginitis: Secondary | ICD-10-CM | POA: Diagnosis not present

## 2018-11-11 DIAGNOSIS — O368130 Decreased fetal movements, third trimester, not applicable or unspecified: Secondary | ICD-10-CM | POA: Diagnosis present

## 2018-11-11 DIAGNOSIS — O23593 Infection of other part of genital tract in pregnancy, third trimester: Secondary | ICD-10-CM

## 2018-11-11 DIAGNOSIS — O368131 Decreased fetal movements, third trimester, fetus 1: Secondary | ICD-10-CM

## 2018-11-11 DIAGNOSIS — K5900 Constipation, unspecified: Secondary | ICD-10-CM | POA: Diagnosis not present

## 2018-11-11 DIAGNOSIS — O99613 Diseases of the digestive system complicating pregnancy, third trimester: Secondary | ICD-10-CM | POA: Diagnosis not present

## 2018-11-11 DIAGNOSIS — B9689 Other specified bacterial agents as the cause of diseases classified elsewhere: Secondary | ICD-10-CM | POA: Diagnosis not present

## 2018-11-11 DIAGNOSIS — O212 Late vomiting of pregnancy: Secondary | ICD-10-CM | POA: Diagnosis not present

## 2018-11-11 LAB — URINALYSIS, ROUTINE W REFLEX MICROSCOPIC
BILIRUBIN URINE: NEGATIVE
Glucose, UA: NEGATIVE mg/dL
Hgb urine dipstick: NEGATIVE
Ketones, ur: NEGATIVE mg/dL
Leukocytes,Ua: NEGATIVE
NITRITE: NEGATIVE
PH: 6 (ref 5.0–8.0)
Protein, ur: NEGATIVE mg/dL
SPECIFIC GRAVITY, URINE: 1.015 (ref 1.005–1.030)

## 2018-11-11 LAB — WET PREP, GENITAL
Sperm: NONE SEEN
TRICH WET PREP: NONE SEEN
Yeast Wet Prep HPF POC: NONE SEEN

## 2018-11-11 MED ORDER — DOCUSATE SODIUM 100 MG PO CAPS: 100.0000 mg | ORAL_CAPSULE | Freq: Two times a day (BID) | ORAL | 2 refills | Status: DC

## 2018-11-11 MED ORDER — METRONIDAZOLE 500 MG PO TABS: 500.0000 mg | ORAL_TABLET | Freq: Two times a day (BID) | ORAL | 0 refills | Status: DC

## 2018-11-11 NOTE — MAU Provider Note (Signed)
History   History obtained from patient.   CSN: [id number]  Arrival date and time: 11/11/18 0847 Chief Complaint  Patient presents with  . Abdominal Pain  . Emesis  . Decreased Fetal Movement   HPI  Lindsay Ramirez is a 33 year old female [phone] that is [redacted]w[redacted]d who presents to MAU with a chief complaint of nausea, vomiting, abdominal pain, and decreased fetal movement. Fetal heart tones obtained at MAU (158 bpm). Patient has not felt her baby boy move since last night at 10:00pm. Patient states that she started to experience nausea, vomiting, and abdominal pain since 6:00 am this morning. Patient had 1 episode of vomiting. Pain is diffuse and described as a cramp. She is unsure if they are contractions. Pain was 10/10 this morning, and decreased to a 7/10 after taking tylenol this morning.  Patient states that the lining of her underwear has been wet recently, but does not believe that her water broke. Denies dizziness, headaches, shortness of breath, diarrhea, constipation, vaginal discharge, vaginal bleeding, leaking of fluid, or recent illness. No sick contacts. Last bowel movement was last night. She states that her stool was hard, but not hard to past. Patient has been passing flatulence.   OB History    Gravida  7   Para  5   Term  3   Preterm  2   AB  1   Living  6     SAB  1   TAB  0   Ectopic  0   Multiple  1   Live Births  6           Past Medical History:  Diagnosis Date  . Abnormal Pap smear    LAST PAP 06/2011  . Anemia    CHRONIC  . Asthma    rarely uses inhaler,  [facility]  . Bipolar affective disorder ([place])   . Bipolar affective disorder ([place]) 2006   TOOK MEDS X 3 YR  . Depression    PP 2013  . Gout    feet - no meds  . Headache(784.0)    otc med prn  . Infection    Hx -UTI  . Mental disorder   . Missed ab 10/2013  . Sickle cell trait ([place])   . SVD (spontaneous vaginal delivery)    x 4  . Vaginal Pap smear, abnormal     Past  Surgical History:  Procedure Laterality Date  . CESAREAN SECTION    . CESAREAN SECTION MULTI-GESTATIONAL N/A 09/19/2014   Procedure: CESAREAN SECTION MULTI-GESTATIONAL;  Surgeon: Shelly Bombard, MD;  Location: [facility];  Service: Obstetrics;  Laterality: N/A;  . DILATION AND CURETTAGE OF UTERUS    . DILATION AND EVACUATION N/A 11/04/2013   Procedure: DILATATION AND EVACUATION;  Surgeon: Alwyn Pea, MD;  Location: [facility];  Service: Gynecology;  Laterality: N/A;  . MOUTH SURGERY     wisdom teeth ext  . VAGINAL DELIVERY     x 4  . WISDOM TOOTH EXTRACTION  2012    Family History  Problem Relation Age of Onset  . Hypertension Mother   . Asthma Mother   . Heart disease Mother   . Hyperlipidemia Mother   . Thyroid disease Mother   . Alcohol abuse Father   . Cancer Maternal Grandmother 37       COLON  . Depression Maternal Grandmother   . Alcohol abuse Maternal Grandmother   . Drug abuse Maternal Grandmother   .  Alcohol abuse Maternal Grandfather   . Drug abuse Maternal Grandfather   . Stroke Maternal Grandfather   . Alcohol abuse Paternal Grandfather   . Drug abuse Paternal Grandfather   . Asthma Sister   . Depression Sister   . Diabetes Maternal Aunt   . HIV Maternal Aunt   . Asthma Sister     Social History   Tobacco Use  . Smoking status: Never Smoker  . Smokeless tobacco: Never Used  Substance Use Topics  . Alcohol use: No    Alcohol/week: 0.0 standard drinks  . Drug use: No    Allergies: No Known Allergies  Medications Prior to Admission  Medication Sig Dispense Refill Last Dose  . acetaminophen (TYLENOL) 500 MG tablet Take 500 mg by mouth every 6 (six) hours as needed.   Taking  . ondansetron (ZOFRAN ODT) 4 MG disintegrating tablet Take 1 tablet (4 mg total) by mouth every 6 (six) hours as needed for nausea. 20 tablet 5 10/25/2018 at Unknown time    Review of Systems  Constitutional: Negative for chills and fever.  Respiratory: Negative for shortness of  breath.   Cardiovascular: Negative for chest pain ( yesterday) and leg swelling.  Gastrointestinal: Positive for abdominal pain (generalized), nausea and vomiting. Negative for blood in stool, constipation and diarrhea.  Genitourinary: Negative for dysuria, flank pain, frequency, hematuria, urgency, vaginal bleeding and vaginal discharge.  Neurological: Positive for headaches. Negative for dizziness.   Physical Exam   Blood pressure 116/63, pulse 78, temperature 98.6 F (37 C), temperature source Oral, resp. rate 16, weight 99.2 kg, last menstrual period 03/09/2018.  Physical Exam  Constitutional: She is oriented to person, place, and time. She appears well-developed and well-nourished.  HENT:  Head: Normocephalic and atraumatic.  Neck: Normal range of motion. Neck supple.  Cardiovascular: Normal rate, regular rhythm and normal heart sounds.  Respiratory: Effort normal and breath sounds normal.  GI: There is abdominal tenderness (generalized). There is no rebound and no guarding.  Genitourinary: There is no rash, tenderness, lesion or injury on the right labia. There is no rash, tenderness, lesion or injury on the left labia. Cervix exhibits no discharge.    Vaginal discharge (copious white milky discharge) present.     No vaginal erythema, tenderness or bleeding.  No erythema, tenderness or bleeding in the vagina.    No signs of injury in the vagina.   Neurological: She is alert and oriented to person, place, and time.  Skin: Skin is warm and dry.    MAU Course  MDM  Lindsay Ramirez is a 33 year old female that is [redacted]w[redacted]d who presents to MAU with a chief complaint of abdominal pain, nausea, and vomiting.   Orders Placed This Encounter  Procedures  . Wet prep, genital  . Urinalysis, Routine w reflex microscopic  . Discharge patient   On bimanual exam, hard stool was palpated in the rectum. Urinalysis negative. Wet prep revealed clue cells and white blood cells. Abdominal  cramping most likely due to bacterial vaginosis and constipation.   Assessment and Plan  Bacterial Vaginosis and Constipation, unspecified.   1. Prescribed metronidazole 500 mg tablet to treat bacterial vaginosis. 2. Prescribed docusate sodium for constipation and gas pain. Encouraged to increase fluid intake to promote bowel movements.  3. Advised to take tylenol as needed for abdominal cramping. 4. Follow up for routine prenatal care and return to emergency department if symptoms get worse, or if patient starts to experience contractions, leaking of fluid, or  vaginal bleeding.    Shawnie Dapper PA-S2 11/11/2018, 10:49 AM

## 2018-11-11 NOTE — Discharge Instructions (Signed)
Bacterial Vaginosis  Bacterial vaginosis is a vaginal infection that occurs when the normal balance of bacteria in the vagina is disrupted. It results from an overgrowth of certain bacteria. This is the most common vaginal infection among women ages 71-44. Because bacterial vaginosis increases your risk for STIs (sexually transmitted infections), getting treated can help reduce your risk for chlamydia, gonorrhea, herpes, and HIV (human immunodeficiency virus). Treatment is also important for preventing complications in pregnant women, because this condition can cause an early (premature) delivery. What are the causes? This condition is caused by an increase in harmful bacteria that are normally present in small amounts in the vagina. However, the reason that the condition develops is not fully understood. What increases the risk? The following factors may make you more likely to develop this condition:  Having a new sexual partner or multiple sexual partners.  Having unprotected sex.  Douching.  Having an intrauterine device (IUD).  Smoking.  Drug and alcohol abuse.  Taking certain antibiotic medicines.  Being pregnant. You cannot get bacterial vaginosis from toilet seats, bedding, swimming pools, or contact with objects around you. What are the signs or symptoms? Symptoms of this condition include:  Grey or white vaginal discharge. The discharge can also be watery or foamy.  A fish-like odor with discharge, especially after sexual intercourse or during menstruation.  Itching in and around the vagina.  Burning or pain with urination. Some women with bacterial vaginosis have no signs or symptoms. How is this diagnosed? This condition is diagnosed based on:  Your medical history.  A physical exam of the vagina.  Testing a sample of vaginal fluid under a microscope to look for a large amount of bad bacteria or abnormal cells. Your health care provider may use a cotton swab or  a small wooden spatula to collect the sample. How is this treated? This condition is treated with antibiotics. These may be given as a pill, a vaginal cream, or a medicine that is put into the vagina (suppository). If the condition comes back after treatment, a second round of antibiotics may be needed. Follow these instructions at home: Medicines  Take over-the-counter and prescription medicines only as told by your health care provider.  Take or use your antibiotic as told by your health care provider. Do not stop taking or using the antibiotic even if you start to feel better. General instructions  If you have a female sexual partner, tell her that you have a vaginal infection. She should see her health care provider and be treated if she has symptoms. If you have a female sexual partner, he does not need treatment.  During treatment: ? Avoid sexual activity until you finish treatment. ? Do not douche. ? Avoid alcohol as directed by your health care provider. ? Avoid breastfeeding as directed by your health care provider.  Drink enough water and fluids to keep your urine clear or pale yellow.  Keep the area around your vagina and rectum clean. ? Wash the area daily with warm water. ? Wipe yourself from front to back after using the toilet.  Keep all follow-up visits as told by your health care provider. This is important. How is this prevented?  Do not douche.  Wash the outside of your vagina with warm water only.  Use protection when having sex. This includes latex condoms and dental dams.  Limit how many sexual partners you have. To help prevent bacterial vaginosis, it is best to have sex with just one partner (  monogamous).  Make sure you and your sexual partner are tested for STIs.  Wear cotton or cotton-lined underwear.  Avoid wearing tight pants and pantyhose, especially during summer.  Limit the amount of alcohol that you drink.  Do not use any products that contain  nicotine or tobacco, such as cigarettes and e-cigarettes. If you need help quitting, ask your health care provider.  Do not use illegal drugs. Where to find more information  Centers for Disease Control and Prevention: AppraiserFraud.fi  American Sexual Health Association (ASHA): www.ashastd.org  [place] Department of Health and Financial controller, Office on Women's Health: DustingSprays.pl or SecuritiesCard.it Contact a health care provider if:  Your symptoms do not improve, even after treatment.  You have more discharge or pain when urinating.  You have a fever.  You have pain in your abdomen.  You have pain during sex.  You have vaginal bleeding between periods. Summary  Bacterial vaginosis is a vaginal infection that occurs when the normal balance of bacteria in the vagina is disrupted.  Because bacterial vaginosis increases your risk for STIs (sexually transmitted infections), getting treated can help reduce your risk for chlamydia, gonorrhea, herpes, and HIV (human immunodeficiency virus). Treatment is also important for preventing complications in pregnant women, because the condition can cause an early (premature) delivery.  This condition is treated with antibiotic medicines. These may be given as a pill, a vaginal cream, or a medicine that is put into the vagina (suppository). This information is not intended to replace advice given to you by your health care provider. Make sure you discuss any questions you have with your health care provider. Document Released: 08/25/2005 Document Revised: 12/29/2016 Document Reviewed: 05/10/2016 Elsevier Interactive Patient Education  2019 [employer].  Vaginal Birth After Cesarean Delivery  Vaginal birth after cesarean delivery (VBAC) is giving birth vaginally after previously delivering a baby through a cesarean section (C-section). A VBAC may be a safe option for you, depending on your health  and other factors. It is important to discuss VBAC with your health care provider early in your pregnancy so you can understand the risks, benefits, and options. Having these discussions early will give you time to make your birth plan. Who are the best candidates for VBAC? The best candidates for VBAC are women who:  Have had one or two prior cesarean deliveries, and the incision made during the delivery was horizontal (low transverse).  Do not have a vertical (classical) scar on their uterus.  Have not had a tear in the wall of their uterus (uterine rupture).  Plan to have more pregnancies. A VBAC is also more likely to be successful:  In women who have previously given birth vaginally.  When labor starts by itself (spontaneously) before the due date. What are the benefits of VBAC? The benefits of delivering your baby vaginally instead of by a cesarean delivery include:  A shorter hospital stay.  A faster recovery time.  Less pain.  Avoiding risks associated with major surgery, such as infection and blood clots.  Less blood loss and less need for donated blood (transfusions). What are the risks of VBAC? The main risk of attempting a VBAC is that it may fail, forcing your health care provider to deliver your baby by a C-section. Other risks are rare and include:  Tearing (rupture) of the scar from a past cesarean delivery.  Other risks associated with vaginal deliveries. If a repeat cesarean delivery is needed, the risks include:  Blood loss.  Infection.  Blood clot.  Damage to surrounding organs.  Removal of the uterus (hysterectomy), if it is damaged.  Placenta problems in future pregnancies. What else should I know about my options? Delivering a baby through a VBAC is similar to having a normal spontaneous vaginal delivery. Therefore, it is safe:  To try with twins.  For your health care provider to try to turn the baby from a breech position (external  cephalic version) during labor.  With epidural analgesia for pain relief. Consider where you would like to deliver your baby. VBAC should be attempted in facilities where an emergency cesarean delivery can be performed. VBAC is not recommended for home births. Any changes in your health or your babys health during your pregnancy may make it necessary to change your initial decision about VBAC. Your health care provider may recommend that you do not attempt a VBAC if:  Your baby's suspected weight is 8.8 lb (4 kg) or more.  You have preeclampsia. This is a condition that causes high blood pressure along with other symptoms, such as swelling and headaches.  You will have VBAC less than 19 months after your cesarean delivery.  You are past your due date.  You need to have labor started (induced) because your cervix is not ready for labor (unfavorable). Where to find more information  American Pregnancy Association: americanpregnancy.org  [employer] of Obst[employer]ians and Gynecologists: acog.org Summary  Vaginal birth after cesarean delivery (VBAC) is giving birth vaginally after previously delivering a baby through a cesarean section (C-section). A VBAC may be a safe option for you, depending on your health and other factors.  Discuss VBAC with your health care provider early in your pregnancy so you can understand the risks, benefits, options, and have plenty of time to make your birth plan.  The main risk of attempting a VBAC is that it may fail, forcing your health care provider to deliver your baby by a C-section. Other risks are rare. This information is not intended to replace advice given to you by your health care provider. Make sure you discuss any questions you have with your health care provider. Document Released: 02/15/2007 Document Revised: 12/02/2016 Document Reviewed: 12/02/2016 Elsevier Interactive Patient Education  2019 [employer].

## 2018-11-11 NOTE — [facility] Note (Signed)
Pt C/O abdominal pain that started this a.m., entire abdomen hurts.  Pain is constant, vomited x 1, no diarrhea.  Pt states pain is not like contractions, denies bleeding or LOF.  Decreased fetal movement this a.m.

## 2018-11-18 ENCOUNTER — Ambulatory Visit: Payer: Medicaid Other

## 2018-11-18 ENCOUNTER — Encounter (HOSPITAL_COMMUNITY): Payer: Self-pay

## 2018-11-18 ENCOUNTER — Encounter (HOSPITAL_COMMUNITY): Payer: Self-pay | Admitting: *Deleted

## 2018-11-18 ENCOUNTER — Inpatient Hospital Stay (HOSPITAL_BASED_OUTPATIENT_CLINIC_OR_DEPARTMENT_OTHER): Payer: Medicaid Other

## 2018-11-18 ENCOUNTER — Other Ambulatory Visit: Payer: Self-pay

## 2018-11-18 ENCOUNTER — Ambulatory Visit (HOSPITAL_COMMUNITY): Payer: Medicaid Other | Attending: Obstetrics and Gynecology

## 2018-11-18 ENCOUNTER — Inpatient Hospital Stay (HOSPITAL_COMMUNITY)
Admission: AD | Admit: 2018-11-18 | Discharge: 2018-11-18 | Disposition: A | Discharge status: Home / Self Care | Payer: Medicaid Other | Source: Ambulatory Visit | Attending: Obstetrics and Gynecology | Admitting: Obstetrics and Gynecology

## 2018-11-18 ENCOUNTER — Ambulatory Visit (HOSPITAL_COMMUNITY): Admission: RE | Admit: 2018-11-18 | Payer: Medicaid Other | Source: Ambulatory Visit

## 2018-11-18 VITALS — BP 104/69 | HR 80 | Wt 216.4 lb

## 2018-11-18 DIAGNOSIS — O09213 Supervision of pregnancy with history of pre-term labor, third trimester: Secondary | ICD-10-CM | POA: Diagnosis not present

## 2018-11-18 DIAGNOSIS — Z3A36 36 weeks gestation of pregnancy: Secondary | ICD-10-CM | POA: Diagnosis not present

## 2018-11-18 DIAGNOSIS — O368130 Decreased fetal movements, third trimester, not applicable or unspecified: Secondary | ICD-10-CM | POA: Diagnosis not present

## 2018-11-18 DIAGNOSIS — Z3689 Encounter for other specified antenatal screening: Secondary | ICD-10-CM

## 2018-11-18 DIAGNOSIS — O99213 Obesity complicating pregnancy, third trimester: Secondary | ICD-10-CM

## 2018-11-18 DIAGNOSIS — O09293 Supervision of pregnancy with other poor reproductive or obstetric history, third trimester: Secondary | ICD-10-CM | POA: Diagnosis not present

## 2018-11-18 DIAGNOSIS — Z3480 Encounter for supervision of other normal pregnancy, unspecified trimester: Secondary | ICD-10-CM

## 2018-11-18 NOTE — Progress Notes (Signed)
I have reviewed this chart and agree with the RN/CMA assessment and management.    K. Meryl Davis, M.D. Attending [facility] (Faculty Practice)   

## 2018-11-18 NOTE — Progress Notes (Signed)
Pt is here for NST, she states she has not felt the baby move at all this morning and is having contractions about every 15 mins and pressure. Pt denies any bleeding or LOF. Pt had [place] scheduled today at [facility] but state she has no one to watch her kids so she cannot go. When asking pt what her plan is for her kids when she goes into labor she states "I plan to not go to the hospital, I'm going to have the baby at home by myself". Pregnancy care coordinator called to the room to speak with pt while she is on the NST machine. NST is non reactive with one decel, minimal variability, Baseline HR 145. Dr. Rosana Hoes advises pt go to [facility] for evaluation. Pt verbalizes understanding, [facility] notified .

## 2018-11-18 NOTE — Discharge Instructions (Signed)
Fetal Movement Counts °Patient Name: ________________________________________________ Patient Due Date: ____________________ °What is a fetal movement count? ° °A fetal movement count is the number of times that you feel your baby move during a certain amount of time. This may also be called a fetal kick count. A fetal movement count is recommended for every pregnant woman. You may be asked to start counting fetal movements as early as week 28 of your pregnancy. °Pay attention to when your baby is most active. You may notice your baby's sleep and wake cycles. You may also notice things that make your baby move more. You should do a fetal movement count: °· When your baby is normally most active. °· At the same time each day. °A good time to count movements is while you are resting, after having something to eat and drink. °How do I count fetal movements? °1. Find a quiet, comfortable area. Sit, or lie down on your side. °2. Write down the date, the start time and stop time, and the number of movements that you felt between those two times. Take this information with you to your health care visits. °3. For 2 hours, count kicks, flutters, swishes, rolls, and jabs. You should feel at least 10 movements during 2 hours. °4. You may stop counting after you have felt 10 movements. °5. If you do not feel 10 movements in 2 hours, have something to eat and drink. Then, keep resting and counting for 1 hour. If you feel at least 4 movements during that hour, you may stop counting. °Contact a health care provider if: °· You feel fewer than 4 movements in 2 hours. °· Your baby is not moving like he or she usually does. °Date: ____________ Start time: ____________ Stop time: ____________ Movements: ____________ °Date: ____________ Start time: ____________ Stop time: ____________ Movements: ____________ °Date: ____________ Start time: ____________ Stop time: ____________ Movements: ____________ °Date: ____________ Start time:  ____________ Stop time: ____________ Movements: ____________ °Date: ____________ Start time: ____________ Stop time: ____________ Movements: ____________ °Date: ____________ Start time: ____________ Stop time: ____________ Movements: ____________ °Date: ____________ Start time: ____________ Stop time: ____________ Movements: ____________ °Date: ____________ Start time: ____________ Stop time: ____________ Movements: ____________ °Date: ____________ Start time: ____________ Stop time: ____________ Movements: ____________ °This information is not intended to replace advice given to you by your health care provider. Make sure you discuss any questions you have with your health care provider. °Document Released: 09/24/2006 Document Revised: 04/23/2016 Document Reviewed: 10/04/2015 °Elsevier Interactive Patient Education © 2019 Else[employer] Inc[employer] °Signs and Symptoms of Labor °Labor is your body's natural process of moving your baby, placenta, and umbilical cord out of your uterus. The process of labor usually starts when your baby is full-term, between 37 and 40 weeks of pregnancy. °How will I know when I am close to going into labor? °As your body prepares for labor and the birth of your baby, you may notice the following symptoms in the weeks and days before true labor starts: °· Having a strong desire to get your home ready to receive your new baby. This is called nesting. Nesting may be a sign that labor is approaching, and it may occur several weeks before birth. Nesting may involve cleaning and organizing your home. °· Passing a small amount of thick, bloody mucus out of your vagina (normal bloody show or losing your mucus plug). This may happen more than a week before labor begins, or it might occur right before labor begins as the opening of the cervix   starts to widen (dilate). For some women, the entire mucus plug passes at once. For others, smaller portions of the mucus plug may gradually pass over several  days. °· Your baby moving (dropping) lower in your pelvis to get into position for birth (lightening). When this happens, you may feel more pressure on your bladder and pelvic bone and less pressure on your ribs. This may make it easier to breathe. It may also cause you to need to urinate more often and have problems with bowel movements. °· Having "practice contractions" (Braxton Hicks contractions) that occur at irregular (unevenly spaced) intervals that are more than 10 minutes apart. This is also called false labor. False labor contractions are common after exercise or sexual activity, and they will stop if you change position, rest, or drink fluids. These contractions are usually mild and do not get stronger over time. They may feel like: °? A backache or back pain. °? Mild cramps, similar to menstrual cramps. °? Tightening or pressure in your abdomen. °Other early symptoms that labor may be starting soon include: °· Nausea or loss of appetite. °· Diarrhea. °· Having a sudden burst of energy, or feeling very tired. °· Mood changes. °· Having trouble sleeping. °How will I know when labor has begun? °Signs that true labor has begun may include: °· Having contractions that come at regular (evenly spaced) intervals and increase in intensity. This may feel like more intense tightening or pressure in your abdomen that moves to your back. °? Contractions may also feel like rhythmic pain in your upper thighs or back that comes and goes at regular intervals. °? For first-time mothers, this change in intensity of contractions often occurs at a more gradual pace. °? Women who have given birth before may notice a more rapid progression of contraction changes. °· Having a feeling of pressure in the vaginal area. °· Your water breaking (rupture of membranes). This is when the sac of fluid that surrounds your baby breaks. When this happens, you will notice fluid leaking from your vagina. This may be clear or blood-tinged.  Labor usually starts within 24 hours of your water breaking, but it may take longer to begin. °? Some women notice this as a gush of fluid. °? Others notice that their underwear repeatedly becomes damp. °Follow these instructions at home: ° °· When labor starts, or if your water breaks, call your health care provider or nurse care line. Based on your situation, they will determine when you should go in for an exam. °· When you are in early labor, you may be able to rest and manage symptoms at home. Some strategies to try at home include: °? Breathing and relaxation techniques. °? Taking a warm bath or shower. °? Listening to music. °? Using a heating pad on the lower back for pain. If you are directed to use heat: °§ Place a towel between your skin and the heat source. °§ Leave the heat on for 20-30 minutes. °§ Remove the heat if your skin turns bright red. This is especially important if you are unable to feel pain, heat, or cold. You may have a greater risk of getting burned. °Get help right away if: °· You have painful, regular contractions that are 5 minutes apart or less. °· Labor starts before you are [redacted] weeks along in your pregnancy. °· You have a fever. °· You have a headache that does not go away. °· You have bright red blood coming from your vagina. °·   You do not feel your baby moving. °· You have a sudden onset of: °? Severe headache with vision problems. °? Nausea, vomiting, or diarrhea. °? Chest pain or shortness of breath. °These symptoms may be an emergency. If your health care provider recommends that you go to the hospital or birth center where you plan to deliver, do not drive yourself. Have someone else drive you, or call emergency services (911 in the [place] °Summary °· Labor is your body's natural process of moving your baby, placenta, and umbilical cord out of your uterus. °· The process of labor usually starts when your baby is full-term, between 37 and 40 weeks of pregnancy. °· When labor  starts, or if your water breaks, call your health care provider or nurse care line. Based on your situation, they will determine when you should go in for an exam. °This information is not intended to replace advice given to you by your health care provider. Make sure you discuss any questions you have with your health care provider. °Document Released: 01/30/2017 Document Revised: 01/30/2017 Document Reviewed: 01/30/2017 °Elsevier Interactive Patient Education © 2019 Elsevier Inc. ° °

## 2018-11-18 NOTE — MAU Provider Note (Signed)
History   [id number]   Chief Complaint  Patient presents with  . Contractions  . Decreased Fetal Movement    HPI Lindsay Ramirez is a 33 y.o. female  [phone] here with report of decreased fetal movement since this morning. Was in the office today. Had NST, per note it was non reactive with minimal variability & 1 fetal decel. Pt reports return of movement since being in MAU.  Denies vaginal bleeding or leaking of fluid.  Feels occasional contraction & would like her cervix checked.   Patient's last menstrual period was 03/09/2018 (exact date).  OB History  Gravida Para Term Preterm AB Living  7 5 3 2 1 6   SAB TAB Ectopic Multiple Live Births  1 0 0 1 6    # Outcome Date GA Lbr Len/2nd Weight Sex Delivery Anes PTL Lv  7 Current           6A Preterm 09/19/14 [redacted]w[redacted]d  2290 g F CS-LTranv Spinal  LIV  6B Preterm 09/19/14 [redacted]w[redacted]d  2150 g M CS-LTranv Spinal  LIV  5 SAB 2015          4 Preterm 12/12/12 [redacted]w[redacted]d 10:18 / 00:06 2605 g M Vag-Spont EPI  LIV     Birth Comments: none  3 Term 02/14/12 [redacted]w[redacted]d 07:40 / 00:18 3120 g M Vag-Spont EPI  LIV     Birth Comments: WNL  2 Term 03/2011 [redacted]w[redacted]d 06:00 3260 g F Vag-Spont EPI  LIV  1 Term 07/2006 [redacted]w[redacted]d 08:00 3345 g F Vag-Spont EPI  LIV    Past Medical History:  Diagnosis Date  . Abnormal Pap smear    LAST PAP 06/2011  . Anemia    CHRONIC  . Asthma    rarely uses inhaler,  [facility]  . Bipolar affective disorder ([place])   . Bipolar affective disorder ([place]) 2006   TOOK MEDS X 3 YR  . Depression    PP 2013  . Gout    feet - no meds  . Headache(784.0)    otc med prn  . Infection    Hx -UTI  . Mental disorder   . Missed ab 10/2013  . Sickle cell trait ([place])   . SVD (spontaneous vaginal delivery)    x 4  . Vaginal Pap smear, abnormal     Family History  Problem Relation Age of Onset  . Hypertension Mother   . Asthma Mother   . Heart disease Mother   . Hyperlipidemia Mother   . Thyroid disease Mother   . Alcohol abuse Father   .  Cancer Maternal Grandmother 37       COLON  . Depression Maternal Grandmother   . Alcohol abuse Maternal Grandmother   . Drug abuse Maternal Grandmother   . Alcohol abuse Maternal Grandfather   . Drug abuse Maternal Grandfather   . Stroke Maternal Grandfather   . Alcohol abuse Paternal Grandfather   . Drug abuse Paternal Grandfather   . Asthma Sister   . Depression Sister   . Diabetes Maternal Aunt   . HIV Maternal Aunt   . Asthma Sister     Social History   Socioeconomic History  . Marital status: Divorced    Spouse name: Not on file  . Number of children: 3  . Years of education: 93  . Highest education level: Not on file  Occupational History  . Occupation: Lexicographer: [employer]  . 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  Financial resource strain: Not on file  . Food insecurity:    Worry: Not on file    Inability: Not on file  . Transportation needs:    Medical: Not on file    Non-medical: Not on file  Tobacco Use  . Smoking status: Never Smoker  . Smokeless tobacco: Never Used  Substance and Sexual Activity  . Alcohol use: No    Alcohol/week: 0.0 standard drinks  . Drug use: No  . Sexual activity: Yes    Partners: Male  Lifestyle  . Physical activity:    Days per week: Not on file    Minutes per session: Not on file  . Stress: Not on file  Relationships  . Social connections:    Talks on phone: Not on file    Gets together: Not on file    Attends religious service: Not on file    Active member of club or organization: Not on file    Attends meetings of clubs or organizations: Not on file    Relationship status: Not on file  Other Topics Concern  . Not on file  Social History Narrative  . Not on file    No Known Allergies  No current facility-administered medications on file prior to encounter.    Current Outpatient Medications on File Prior to Encounter  Medication Sig Dispense Refill  . acetaminophen (TYLENOL) 500 MG  tablet Take 500 mg by mouth every 6 (six) hours as needed.    . docusate sodium (COLACE) 100 MG capsule Take 1 capsule (100 mg total) by mouth every 12 (twelve) hours. (Patient not taking: Reported on 11/18/2018) 15 capsule 2  . metroNIDAZOLE (FLAGYL) 500 MG tablet Take 1 tablet (500 mg total) by mouth 2 (two) times daily. (Patient not taking: Reported on 11/18/2018) 14 tablet 0  . ondansetron (ZOFRAN ODT) 4 MG disintegrating tablet Take 1 tablet (4 mg total) by mouth every 6 (six) hours as needed for nausea. (Patient not taking: Reported on 11/18/2018) 20 tablet 5     Review of Systems  Constitutional: Negative.   Gastrointestinal: Positive for abdominal pain.  Genitourinary: Negative.    Physical Exam   Vitals:   11/18/18 1221 11/18/18 1223 11/18/18 1343  BP: (!) 107/56 (!) 107/56 112/61  Pulse: 76 82 77  Resp: 16    Temp: 98.3 F (36.8 C)    TempSrc: Oral    SpO2: 100%    Weight: 97.5 kg    Height: 5\' 2"  (1.575 m)      Physical Exam  Constitutional: She appears well-developed and well-nourished. No distress.  HENT:  Head: Normocephalic and atraumatic.  Respiratory: Effort normal. No respiratory distress.  Skin: She is not diaphoretic.  Psychiatric: She has a normal mood and affect. Her behavior is normal. Thought content normal.   Dilation: 1 Effacement (%): Thick Cervical Position: Posterior Exam by:: Weston,RN  NST:  Baseline: 140 bpm, Variability: Good {> 6 bpm), Accelerations: Reactive and Decelerations: Absent  MAU Course  Procedures No results found for this or any previous visit (from the past 24 hour(s)).  MDM Reactive NST. Pt reports normal fetal movement since being in MAU BBP 8/8 with normal AFI  Assessment and Plan  A: 1. NST (non-stress test) reactive   2. [redacted] weeks gestation of pregnancy   3. Decreased fetal movement, third trimester, not applicable or unspecified fetus    P: Discharge home Discussed reasons to return to MAU Fetal movement  form F/u with OB   Purcell Nails,  Junie Panning, NP 11/18/2018 2:09 PM

## 2018-11-18 NOTE — MAU Note (Signed)
Pt reports contractions, non reactive NST in office , sent here for further monitoring. Pt reports decreased fetal movement since last pm. Denies bleeding or ROM

## 2018-11-22 ENCOUNTER — Other Ambulatory Visit (HOSPITAL_COMMUNITY)
Admission: RE | Admit: 2018-11-22 | Discharge: 2018-11-22 | Disposition: A | Discharge status: Home / Self Care | Payer: Medicaid Other | Source: Ambulatory Visit | Attending: Family Medicine | Admitting: Family Medicine

## 2018-11-22 ENCOUNTER — Other Ambulatory Visit: Payer: Self-pay

## 2018-11-22 ENCOUNTER — Encounter (HOSPITAL_COMMUNITY): Payer: Self-pay

## 2018-11-22 ENCOUNTER — Ambulatory Visit (HOSPITAL_COMMUNITY): Payer: Medicaid Other

## 2018-11-22 ENCOUNTER — Ambulatory Visit (HOSPITAL_COMMUNITY)
Admission: RE | Admit: 2018-11-22 | Discharge: 2018-11-22 | Disposition: A | Discharge status: Home / Self Care | Payer: Medicaid Other | Source: Ambulatory Visit | Attending: Obstetrics and Gynecology | Admitting: Obstetrics and Gynecology

## 2018-11-22 ENCOUNTER — Ambulatory Visit (INDEPENDENT_AMBULATORY_CARE_PROVIDER_SITE_OTHER): Payer: Medicaid Other | Admitting: Advanced Practice Midwife

## 2018-11-22 VITALS — BP 126/80 | HR 94 | Wt 215.0 lb

## 2018-11-22 DIAGNOSIS — Z3A36 36 weeks gestation of pregnancy: Secondary | ICD-10-CM

## 2018-11-22 DIAGNOSIS — O34219 Maternal care for unspecified type scar from previous cesarean delivery: Secondary | ICD-10-CM

## 2018-11-22 DIAGNOSIS — O368130 Decreased fetal movements, third trimester, not applicable or unspecified: Secondary | ICD-10-CM

## 2018-11-22 DIAGNOSIS — Z3480 Encounter for supervision of other normal pregnancy, unspecified trimester: Secondary | ICD-10-CM

## 2018-11-22 DIAGNOSIS — O288 Other abnormal findings on antenatal screening of mother: Secondary | ICD-10-CM

## 2018-11-22 DIAGNOSIS — O09213 Supervision of pregnancy with history of pre-term labor, third trimester: Secondary | ICD-10-CM

## 2018-11-22 DIAGNOSIS — O09219 Supervision of pregnancy with history of pre-term labor, unspecified trimester: Secondary | ICD-10-CM

## 2018-11-22 NOTE — Addendum Note (Signed)
Addended by: Lewie Loron D on: 11/22/2018 01:37 PM   Modules accepted: Orders

## 2018-11-22 NOTE — Progress Notes (Signed)
   PRENATAL VISIT NOTE  Subjective:  Lindsay Ramirez is a 33 y.o. [phone] at [redacted]w[redacted]d being seen today for ongoing prenatal care.  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  Patient reports decreased fetal movement.  Contractions: Irregular. Vag. Bleeding: None.  Movement: Present. Denies leaking of fluid.   The following portions of the patient's history were reviewed and updated as appropriate: allergies, current medications, past family history, past medical history, past social history, past surgical history and problem list.   Objective:   Vitals:   11/22/18 1103  BP: 126/80  Pulse: 94  Weight: 97.5 kg    Fetal Status: Fetal Heart Rate (bpm): 145   Movement: Present     General:  Alert, oriented and cooperative. Patient is in no acute distress.  Skin: Skin is warm and dry. No rash noted.   Cardiovascular: Normal heart rate noted  Respiratory: Normal respiratory effort, no problems with respiration noted  Abdomen: Soft, gravid, appropriate for gestational age.  Pain/Pressure: Present     Pelvic: Cervical exam performed        Extremities: Normal range of motion.     Mental Status: Normal mood and affect. Normal behavior. Normal judgment and thought content.   Assessment and Plan:  Pregnancy: [id number] at [redacted]w[redacted]d 1. Supervision of other normal pregnancy, antepartum --Anticipatory guidance about next visits/weeks of pregnancy given. --Pt had postponed vaccines at previous visit.  Discussed flu and TDAP vaccines, risks/benefits to mom and  baby, reasons they are recommended in pregnancy.  Pt declines vaccines.  2. Previous preterm delivery, antepartum --[redacted]w[redacted]d with twins  3. Previous cesarean delivery, antepartum --C/S for breech twins, plans TOLAC  4. Decreased fetal movements in third trimester, single or unspecified fetus --Pt reports reduced movement x last 2 weeks. BPP in 3/12 was 8/8 in MAU.  Reports feels movement x 1 daily.  NST in office today. --NST not reactive, no decels, no accels, moderate variability.   --Called MFM and pt worked in for stat BPP today and results to be called to me.  If 8/8, pt to keep scheduled visits, go to [facility] if continued or worsening symptoms.  If less than 8/8, will consult attending, consider observation/extended monitoring.   Preterm labor symptoms and general obstetric precautions including but not limited to vaginal bleeding, contractions, leaking of fluid and fetal movement were reviewed in detail with the patient. Please refer to After Visit Summary for other counseling recommendations.   No follow-ups on file.  No future appointments.  Lindsay Ramirez, CNM

## 2018-11-23 LAB — CERVICOVAGINAL ANCILLARY ONLY
Chlamydia: NEGATIVE
Neisseria Gonorrhea: NEGATIVE

## 2018-11-24 LAB — STREP GP B NAA: Strep Gp B NAA: NEGATIVE

## 2018-11-29 ENCOUNTER — Encounter: Payer: Self-pay | Admitting: Obstetrics and Gynecology

## 2018-11-29 ENCOUNTER — Ambulatory Visit (HOSPITAL_COMMUNITY): Payer: Medicaid Other

## 2018-11-29 ENCOUNTER — Ambulatory Visit (INDEPENDENT_AMBULATORY_CARE_PROVIDER_SITE_OTHER): Payer: Medicaid Other | Admitting: Obstetrics and Gynecology

## 2018-11-29 ENCOUNTER — Ambulatory Visit (HOSPITAL_COMMUNITY)
Admission: RE | Admit: 2018-11-29 | Discharge: 2018-11-29 | Disposition: A | Discharge status: Home / Self Care | Payer: Medicaid Other | Source: Ambulatory Visit | Attending: Obstetrics and Gynecology | Admitting: Obstetrics and Gynecology

## 2018-11-29 ENCOUNTER — Other Ambulatory Visit: Payer: Self-pay

## 2018-11-29 VITALS — BP 105/72 | HR 66 | Wt 216.9 lb

## 2018-11-29 DIAGNOSIS — Z3480 Encounter for supervision of other normal pregnancy, unspecified trimester: Secondary | ICD-10-CM

## 2018-11-29 DIAGNOSIS — O99012 Anemia complicating pregnancy, second trimester: Secondary | ICD-10-CM

## 2018-11-29 DIAGNOSIS — O09213 Supervision of pregnancy with history of pre-term labor, third trimester: Secondary | ICD-10-CM

## 2018-11-29 DIAGNOSIS — O34219 Maternal care for unspecified type scar from previous cesarean delivery: Secondary | ICD-10-CM

## 2018-11-29 DIAGNOSIS — O368130 Decreased fetal movements, third trimester, not applicable or unspecified: Secondary | ICD-10-CM | POA: Diagnosis not present

## 2018-11-29 DIAGNOSIS — O289 Unspecified abnormal findings on antenatal screening of mother: Secondary | ICD-10-CM

## 2018-11-29 DIAGNOSIS — O99013 Anemia complicating pregnancy, third trimester: Secondary | ICD-10-CM

## 2018-11-29 DIAGNOSIS — Z3A37 37 weeks gestation of pregnancy: Secondary | ICD-10-CM

## 2018-11-29 DIAGNOSIS — O99213 Obesity complicating pregnancy, third trimester: Secondary | ICD-10-CM

## 2018-11-29 DIAGNOSIS — O09293 Supervision of pregnancy with other poor reproductive or obstetric history, third trimester: Secondary | ICD-10-CM

## 2018-11-29 MED ORDER — FERROUS SULFATE 325 (65 FE) MG PO TABS: 325.0000 mg | ORAL_TABLET | Freq: Two times a day (BID) | ORAL | 1 refills | Status: DC

## 2018-11-29 NOTE — Progress Notes (Signed)
Patient reports decreased fetal movement, has not felt baby move all day. Pt denies contractions, but reports constant vaginal pain, requests cervical check today. Pt was suppose to have BPP done at last visit but was unable to stay for the visit.

## 2018-11-29 NOTE — Progress Notes (Signed)
   PRENATAL VISIT NOTE  Subjective:  Lindsay Ramirez is a 33 y.o. [phone] at [redacted]w[redacted]d being seen today for ongoing prenatal care.  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  Patient reports decreased fetal movement.  Contractions: Not present. Vag. Bleeding: None.  Movement: (!) Decreased. Denies leaking of fluid.   The following portions of the patient's history were reviewed and updated as appropriate: allergies, current medications, past family history, past medical history, past social history, past surgical history and problem list.   Objective:   Vitals:   11/29/18 1332  BP: 105/72  Pulse: 66  Weight: 216 lb 14.4 oz (98.4 kg)    Fetal Status: Fetal Heart Rate (bpm): NST   Movement: (!) Decreased     General:  Alert, oriented and cooperative. Patient is in no acute distress.  Skin: Skin is warm and dry. No rash noted.   Cardiovascular: Normal heart rate noted  Respiratory: Normal respiratory effort, no problems with respiration noted  Abdomen: Soft, gravid, appropriate for gestational age.  Pain/Pressure: Present     Pelvic: Cervical exam deferred        Extremities: Normal range of motion.  Edema: Trace  Mental Status: Normal mood and affect. Normal behavior. Normal judgment and thought content.   Assessment and Plan:  Pregnancy: [id number] at [redacted]w[redacted]d 1. Supervision of other normal pregnancy, antepartum Patient sent for BPP due to non reactive NST Baseline 140, mod variability, no accels, no decels Advised patient nor to wait for  appointments to be evaluated for decrease fetal movement but rather to present to MAU  2. Previous cesarean delivery, antepartum Patient desires TOLAC  3. Anemia affecting pregnancy in second trimester Continue iron supplement  Term labor symptoms and general obstetric precautions including but not limited to vaginal bleeding, contractions, leaking of fluid and fetal movement were reviewed in detail with the patient. Please refer to After Visit Summary for other counseling recommendations.   No follow-ups on file.  No future appointments.  Mora Bellman, MD

## 2018-12-01 ENCOUNTER — Telehealth: Payer: Self-pay | Admitting: *Deleted

## 2018-12-01 NOTE — Telephone Encounter (Signed)
Pt called to office for refill on her nausea medication.  Pt states she has used Zofran for N&V as Diclegis did not work for her.   Please review and send Rx for Zofran if approved.

## 2018-12-02 MED ORDER — ONDANSETRON 4 MG PO TBDP: 4.0000 mg | ORAL_TABLET | Freq: Four times a day (QID) | ORAL | 0 refills | Status: DC | PRN

## 2018-12-02 NOTE — Telephone Encounter (Signed)
LM on VM making pt aware of Rx

## 2018-12-06 ENCOUNTER — Telehealth: Payer: Self-pay

## 2018-12-06 ENCOUNTER — Inpatient Hospital Stay (HOSPITAL_COMMUNITY)
Admission: AD | Admit: 2018-12-06 | Discharge: 2018-12-06 | Disposition: A | Discharge status: Home / Self Care | Payer: Medicaid Other | Attending: Obstetrics & Gynecology | Admitting: Obstetrics & Gynecology

## 2018-12-06 ENCOUNTER — Encounter (HOSPITAL_COMMUNITY): Payer: Self-pay

## 2018-12-06 ENCOUNTER — Other Ambulatory Visit: Payer: Self-pay

## 2018-12-06 DIAGNOSIS — O9989 Other specified diseases and conditions complicating pregnancy, childbirth and the puerperium: Secondary | ICD-10-CM | POA: Insufficient documentation

## 2018-12-06 DIAGNOSIS — D573 Sickle-cell trait: Secondary | ICD-10-CM

## 2018-12-06 DIAGNOSIS — M549 Dorsalgia, unspecified: Secondary | ICD-10-CM | POA: Insufficient documentation

## 2018-12-06 DIAGNOSIS — O09219 Supervision of pregnancy with history of pre-term labor, unspecified trimester: Secondary | ICD-10-CM

## 2018-12-06 DIAGNOSIS — O471 False labor at or after 37 completed weeks of gestation: Secondary | ICD-10-CM

## 2018-12-06 DIAGNOSIS — R109 Unspecified abdominal pain: Secondary | ICD-10-CM | POA: Insufficient documentation

## 2018-12-06 NOTE — Discharge Instructions (Signed)
Braxton Hicks Contractions Contractions of the uterus can occur throughout pregnancy, but they are not always a sign that you are in labor. You may have practice contractions called Braxton Hicks contractions. These false labor contractions are sometimes confused with true labor. What are Braxton Hicks contractions? Braxton Hicks contractions are tightening movements that occur in the muscles of the uterus before labor. Unlike true labor contractions, these contractions do not result in opening (dilation) and thinning of the cervix. Toward the end of pregnancy (32-34 weeks), Braxton Hicks contractions can happen more often and may become stronger. These contractions are sometimes difficult to tell apart from true labor because they can be very uncomfortable. You should not feel embarrassed if you go to the hospital with false labor. Sometimes, the only way to tell if you are in true labor is for your health care provider to look for changes in the cervix. The health care provider will do a physical exam and may monitor your contractions. If you are not in true labor, the exam should show that your cervix is not dilating and your water has not broken. If there are no other health problems associated with your pregnancy, it is completely safe for you to be sent home with false labor. You may continue to have Braxton Hicks contractions until you go into true labor. How to tell the difference between true labor and false labor True labor  Contractions last 30-70 seconds.  Contractions become very regular.  Discomfort is usually felt in the top of the uterus, and it spreads to the lower abdomen and low back.  Contractions do not go away with walking.  Contractions usually become more intense and increase in frequency.  The cervix dilates and gets thinner. False labor  Contractions are usually shorter and not as strong as true labor contractions.  Contractions are usually irregular.  Contractions  are often felt in the front of the lower abdomen and in the groin.  Contractions may go away when you walk around or change positions while lying down.  Contractions get weaker and are shorter-lasting as time goes on.  The cervix usually does not dilate or become thin. Follow these instructions at home:   Take over-the-counter and prescription medicines only as told by your health care provider.  Keep up with your usual exercises and follow other instructions from your health care provider.  Eat and drink lightly if you think you are going into labor.  If Braxton Hicks contractions are making you uncomfortable: ? Change your position from lying down or resting to walking, or change from walking to resting. ? Sit and rest in a tub of warm water. ? Drink enough fluid to keep your urine pale yellow. Dehydration may cause these contractions. ? Do slow and deep breathing several times an hour.  Keep all follow-up prenatal visits as told by your health care provider. This is important. Contact a health care provider if:  You have a fever.  You have continuous pain in your abdomen. Get help right away if:  Your contractions become stronger, more regular, and closer together.  You have fluid leaking or gushing from your vagina.  You pass blood-tinged mucus (bloody show).  You have bleeding from your vagina.  You have low back pain that you never had before.  You feel your baby's head pushing down and causing pelvic pressure.  Your baby is not moving inside you as much as it used to. Summary  Contractions that occur before labor are   called Braxton Hicks contractions, false labor, or practice contractions.  Braxton Hicks contractions are usually shorter, weaker, farther apart, and less regular than true labor contractions. True labor contractions usually become progressively stronger and regular, and they become more frequent.  Manage discomfort from Braxton Hicks contractions  by changing position, resting in a warm bath, drinking plenty of water, or practicing deep breathing. This information is not intended to replace advice given to you by your health care provider. Make sure you discuss any questions you have with your health care provider. Document Released: 01/08/2017 Document Revised: 06/09/2017 Document Reviewed: 01/08/2017 Elsevier Interactive Patient Education  2019 [employer] Inc[employer]  

## 2018-12-06 NOTE — Telephone Encounter (Signed)
Pt called stating that she is having contractions that are unbearable and significant pressure. Pt reports good fetal movement, denies bleeding or leaking of fluid. I advised patient go to the hospital to be evaluated. Pt states that she changed her mind and now wants a C section instead of a VBAC. I advised pt to let the providers know this at the hospital. Pt verbalized understanding.

## 2018-12-06 NOTE — MAU Note (Addendum)
Pt having pain in her back, butt and abdomen, rating a 7/10. Having pain for a few days. No LOF or bleeding +FM Pt states she said she wanted to be induced at 39 weeks because she can't take the pain anymore and was told that if she wanted an induction, she would have to have a c-section.

## 2018-12-07 ENCOUNTER — Other Ambulatory Visit: Payer: Self-pay

## 2018-12-07 ENCOUNTER — Encounter: Payer: Self-pay | Admitting: Obstetrics and Gynecology

## 2018-12-07 ENCOUNTER — Ambulatory Visit (INDEPENDENT_AMBULATORY_CARE_PROVIDER_SITE_OTHER): Payer: Medicaid Other | Admitting: Obstetrics and Gynecology

## 2018-12-07 VITALS — BP 121/71 | HR 80 | Wt 218.0 lb

## 2018-12-07 DIAGNOSIS — Z3A39 39 weeks gestation of pregnancy: Secondary | ICD-10-CM

## 2018-12-07 DIAGNOSIS — Z3480 Encounter for supervision of other normal pregnancy, unspecified trimester: Secondary | ICD-10-CM

## 2018-12-07 DIAGNOSIS — O34219 Maternal care for unspecified type scar from previous cesarean delivery: Secondary | ICD-10-CM

## 2018-12-07 DIAGNOSIS — O99013 Anemia complicating pregnancy, third trimester: Secondary | ICD-10-CM

## 2018-12-07 DIAGNOSIS — O99012 Anemia complicating pregnancy, second trimester: Secondary | ICD-10-CM

## 2018-12-07 NOTE — Progress Notes (Signed)
   PRENATAL VISIT NOTE  Subjective:  Lindsay Ramirez is a 33 y.o. [phone] at [redacted]w[redacted]d being seen today for ongoing prenatal care.  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  Patient reports no complaints.  Contractions: Irregular. Vag. Bleeding: None.  Movement: Present. Denies leaking of fluid.   The following portions of the patient's history were reviewed and updated as appropriate: allergies, current medications, past family history, past medical history, past social history, past surgical history and problem list.   Objective:   Vitals:   12/07/18 1411  BP: 121/71  Pulse: 80  Weight: 218 lb (98.9 kg)    Fetal Status: Fetal Heart Rate (bpm): 145 Fundal Height: 39 cm Movement: Present  Presentation: Vertex  General:  Alert, oriented and cooperative. Patient is in no acute distress.  Skin: Skin is warm and dry. No rash noted.   Cardiovascular: Normal heart rate noted  Respiratory: Normal respiratory effort, no problems with respiration noted  Abdomen: Soft, gravid, appropriate for gestational age.  Pain/Pressure: Present     Pelvic: Cervical exam performed Dilation: 3 Effacement (%): 70 Station: -3  Extremities: Normal range of motion.     Mental Status: Normal mood and affect. Normal behavior. Normal judgment and thought content.   Assessment and Plan:  Pregnancy: [id number] at [redacted]w[redacted]d 1. Supervision of other normal pregnancy, antepartum Patient is doing well without complaints  2. Previous cesarean delivery, antepartum Patient previously  desired a TOLAC but is now ready to deliver and is requesting a repeat cesarean section Patient will be contacted by secretary with cesarean section date She agreed to have her membrane stripped today Post date testing next week if still pregnant  3. Anemia affecting pregnancy in second trimester Continue iron supplement  Term labor symptoms and general obstetric precautions including but not limited to vaginal bleeding, contractions, leaking of fluid and fetal movement were reviewed in detail with the patient. Please refer to After Visit Summary for other counseling recommendations.   Return in about 9 days (around 12/16/2018) for in person: ROB, NST, AFI .  No future appointments.  Mora Bellman, MD

## 2018-12-08 ENCOUNTER — Telehealth: Payer: Self-pay

## 2018-12-08 NOTE — Telephone Encounter (Signed)
Patient called and would like to know when her C-section is going to be scheduled for.

## 2018-12-09 ENCOUNTER — Encounter (HOSPITAL_COMMUNITY): Payer: Self-pay

## 2018-12-09 ENCOUNTER — Other Ambulatory Visit: Payer: Self-pay | Admitting: Obstetrics and Gynecology

## 2018-12-09 NOTE — Patient Instructions (Signed)
Lindsay Ramirez  12/09/2018   Your procedure is scheduled on:  12/11/2018  Arrive at 0700 at Entrance C on [address] at [facility] and [address]. You are invited to use the FREE valet parking or use the Visitor's parking deck.  Pick up the phone at the desk and dial [phone].  Call this number if you have problems the morning of surgery: [phone]  Remember:   Do not eat food:(After Midnight) Desps de medianoche.  Do not drink clear liquids: (After Midnight) Desps de medianoche.  Take these medicines the morning of surgery with A SIP OF WATER:  none   Do not wear jewelry, make-up or nail polish.  Do not wear lotions, powders, or perfumes. Do not wear deodorant.  Do not shave 48 hours prior to surgery.  Do not bring valuables to the hospital.  [facility] is not   responsible for any belongings or valuables brought to the hospital.  Contacts, dentures or bridgework may not be worn into surgery.  Leave suitcase in the car. After surgery it may be brought to your room.  For patients admitted to the hospital, checkout time is 11:00 AM the day of              discharge.      Please read over the following fact sheets that you were given:     Preparing for Surgery

## 2018-12-10 ENCOUNTER — Other Ambulatory Visit: Payer: Self-pay

## 2018-12-10 ENCOUNTER — Encounter (HOSPITAL_COMMUNITY)
Admission: RE | Admit: 2018-12-10 | Discharge: 2018-12-10 | Disposition: A | Discharge status: Home / Self Care | Payer: Medicaid Other | Source: Ambulatory Visit | Attending: Obstetrics and Gynecology | Admitting: Obstetrics and Gynecology

## 2018-12-11 ENCOUNTER — Inpatient Hospital Stay (HOSPITAL_COMMUNITY): Payer: Medicaid Other | Admitting: Anesthesiology

## 2018-12-11 ENCOUNTER — Other Ambulatory Visit: Payer: Self-pay

## 2018-12-11 ENCOUNTER — Inpatient Hospital Stay (HOSPITAL_COMMUNITY)
Admission: RE | Admit: 2018-12-11 | Discharge: 2018-12-13 | DRG: 807 | Disposition: A | Discharge status: Home / Self Care | Payer: Medicaid Other | Attending: Obstetrics and Gynecology | Admitting: Obstetrics and Gynecology

## 2018-12-11 ENCOUNTER — Encounter (HOSPITAL_COMMUNITY)
Admission: RE | Disposition: A | Discharge status: Home / Self Care | Payer: Self-pay | Source: Home / Self Care | Attending: Obstetrics and Gynecology

## 2018-12-11 ENCOUNTER — Encounter (HOSPITAL_COMMUNITY): Payer: Self-pay | Admitting: Emergency Medicine

## 2018-12-11 DIAGNOSIS — Z862 Personal history of diseases of the blood and blood-forming organs and certain disorders involving the immune mechanism: Secondary | ICD-10-CM

## 2018-12-11 DIAGNOSIS — Z3A39 39 weeks gestation of pregnancy: Secondary | ICD-10-CM

## 2018-12-11 DIAGNOSIS — O09219 Supervision of pregnancy with history of pre-term labor, unspecified trimester: Secondary | ICD-10-CM

## 2018-12-11 DIAGNOSIS — D573 Sickle-cell trait: Secondary | ICD-10-CM | POA: Diagnosis present

## 2018-12-11 DIAGNOSIS — O34219 Maternal care for unspecified type scar from previous cesarean delivery: Secondary | ICD-10-CM | POA: Diagnosis present

## 2018-12-11 DIAGNOSIS — O99214 Obesity complicating childbirth: Secondary | ICD-10-CM | POA: Diagnosis present

## 2018-12-11 DIAGNOSIS — F329 Major depressive disorder, single episode, unspecified: Secondary | ICD-10-CM | POA: Diagnosis present

## 2018-12-11 DIAGNOSIS — J45909 Unspecified asthma, uncomplicated: Secondary | ICD-10-CM | POA: Diagnosis present

## 2018-12-11 DIAGNOSIS — O99342 Other mental disorders complicating pregnancy, second trimester: Secondary | ICD-10-CM

## 2018-12-11 DIAGNOSIS — Z98891 History of uterine scar from previous surgery: Secondary | ICD-10-CM

## 2018-12-11 DIAGNOSIS — O9902 Anemia complicating childbirth: Secondary | ICD-10-CM | POA: Diagnosis present

## 2018-12-11 DIAGNOSIS — O9952 Diseases of the respiratory system complicating childbirth: Secondary | ICD-10-CM | POA: Diagnosis present

## 2018-12-11 DIAGNOSIS — O34211 Maternal care for low transverse scar from previous cesarean delivery: Secondary | ICD-10-CM | POA: Diagnosis present

## 2018-12-11 DIAGNOSIS — O99012 Anemia complicating pregnancy, second trimester: Secondary | ICD-10-CM | POA: Diagnosis present

## 2018-12-11 LAB — CBC
HCT: 30.4 % — ABNORMAL LOW (ref 36.0–46.0)
HCT: 32.6 % — ABNORMAL LOW (ref 36.0–46.0)
Hemoglobin: 9.8 g/dL — ABNORMAL LOW (ref 12.0–15.0)
Hemoglobin: 9.9 g/dL — ABNORMAL LOW (ref 12.0–15.0)
MCH: 25.2 pg — ABNORMAL LOW (ref 26.0–34.0)
MCH: 23.7 pg — ABNORMAL LOW (ref 26.0–34.0)
MCHC: 32.2 g/dL (ref 30.0–36.0)
MCHC: 30.4 g/dL (ref 30.0–36.0)
MCV: 78.1 fL — ABNORMAL LOW (ref 80.0–100.0)
MCV: 78.2 fL — ABNORMAL LOW (ref 80.0–100.0)
Platelets: 194 10*3/uL (ref 150–400)
Platelets: 188 10*3/uL (ref 150–400)
RBC: 3.89 MIL/uL (ref 3.87–5.11)
RBC: 4.17 MIL/uL (ref 3.87–5.11)
RDW: 15.2 % (ref 11.5–15.5)
RDW: 15.1 % (ref 11.5–15.5)
WBC: 7.9 10*3/uL (ref 4.0–10.5)
WBC: 8.4 10*3/uL (ref 4.0–10.5)
nRBC: 0 % (ref 0.0–0.2)
nRBC: 0 % (ref 0.0–0.2)

## 2018-12-11 LAB — TYPE AND SCREEN
ABO/RH(D): O POS
Antibody Screen: NEGATIVE

## 2018-12-11 LAB — ABO/RH: ABO/RH(D): O POS

## 2018-12-11 SURGERY — Surgical Case
Anesthesia: Spinal

## 2018-12-11 MED ORDER — ONDANSETRON HCL 4 MG/2ML IJ SOLN
4.0000 mg | Freq: Four times a day (QID) | INTRAMUSCULAR | Status: DC | PRN
  Administered 2018-12-11: 4 mg via INTRAVENOUS
  Filled 2018-12-11: qty 2

## 2018-12-11 MED ORDER — ONDANSETRON HCL 4 MG/2ML IJ SOLN
INTRAMUSCULAR | Status: AC
  Filled 2018-12-11: qty 2

## 2018-12-11 MED ORDER — LIDOCAINE HCL (PF) 1 % IJ SOLN: 30.0000 mL | INTRAMUSCULAR | Status: DC | PRN

## 2018-12-11 MED ORDER — FENTANYL CITRATE (PF) 100 MCG/2ML IJ SOLN
INTRAMUSCULAR | Status: AC
  Filled 2018-12-11: qty 2

## 2018-12-11 MED ORDER — SOD CITRATE-CITRIC ACID 500-334 MG/5ML PO SOLN
ORAL | Status: AC
  Filled 2018-12-11: qty 15

## 2018-12-11 MED ORDER — PHENYLEPHRINE HCL-NACL 20-0.9 MG/250ML-% IV SOLN
INTRAVENOUS | Status: AC
  Filled 2018-12-11: qty 250

## 2018-12-11 MED ORDER — SOD CITRATE-CITRIC ACID 500-334 MG/5ML PO SOLN: 30.0000 mL | ORAL | Status: DC | PRN

## 2018-12-11 MED ORDER — OXYTOCIN 40 UNITS IN NORMAL SALINE INFUSION - SIMPLE MED
INTRAVENOUS | Status: AC
  Filled 2018-12-11: qty 1000

## 2018-12-11 MED ORDER — FENTANYL-BUPIVACAINE-NACL 0.5-0.125-0.9 MG/250ML-% EP SOLN
12.0000 mL/h | EPIDURAL | Status: DC | PRN
  Filled 2018-12-11: qty 250

## 2018-12-11 MED ORDER — OXYTOCIN 40 UNITS IN NORMAL SALINE INFUSION - SIMPLE MED
1.0000 m[IU]/min | INTRAVENOUS | Status: DC
  Administered 2018-12-11: 2 m[IU]/min via INTRAVENOUS

## 2018-12-11 MED ORDER — LACTATED RINGERS IV SOLN
INTRAVENOUS | Status: DC
  Administered 2018-12-11 (×3): via INTRAVENOUS

## 2018-12-11 MED ORDER — EPHEDRINE 5 MG/ML INJ: 10.0000 mg | INTRAVENOUS | Status: DC | PRN

## 2018-12-11 MED ORDER — SCOPOLAMINE 1 MG/3DAYS TD PT72
MEDICATED_PATCH | TRANSDERMAL | Status: AC
  Filled 2018-12-11: qty 1

## 2018-12-11 MED ORDER — CEFAZOLIN SODIUM-DEXTROSE 2-4 GM/100ML-% IV SOLN
INTRAVENOUS | Status: AC
  Filled 2018-12-11: qty 100

## 2018-12-11 MED ORDER — SOD CITRATE-CITRIC ACID 500-334 MG/5ML PO SOLN
30.0000 mL | Freq: Once | ORAL | Status: AC
  Administered 2018-12-11: 30 mL via ORAL

## 2018-12-11 MED ORDER — METOCLOPRAMIDE HCL 5 MG/ML IJ SOLN
INTRAMUSCULAR | Status: AC
  Filled 2018-12-11: qty 2

## 2018-12-11 MED ORDER — OXYTOCIN BOLUS FROM INFUSION
500.0000 mL | Freq: Once | INTRAVENOUS | Status: AC
  Administered 2018-12-12: 500 mL via INTRAVENOUS

## 2018-12-11 MED ORDER — LACTATED RINGERS IV SOLN: 500.0000 mL | Freq: Once | INTRAVENOUS | Status: DC

## 2018-12-11 MED ORDER — ACETAMINOPHEN 325 MG PO TABS: 650.0000 mg | ORAL_TABLET | ORAL | Status: DC | PRN

## 2018-12-11 MED ORDER — TERBUTALINE SULFATE 1 MG/ML IJ SOLN: 0.2500 mg | Freq: Once | INTRAMUSCULAR | Status: DC | PRN

## 2018-12-11 MED ORDER — OXYCODONE-ACETAMINOPHEN 5-325 MG PO TABS: 2.0000 | ORAL_TABLET | ORAL | Status: DC | PRN

## 2018-12-11 MED ORDER — SODIUM CHLORIDE (PF) 0.9 % IJ SOLN
INTRAMUSCULAR | Status: DC | PRN
  Administered 2018-12-11: 12 mL/h via EPIDURAL

## 2018-12-11 MED ORDER — PHENYLEPHRINE 40 MCG/ML (10ML) SYRINGE FOR IV PUSH (FOR BLOOD PRESSURE SUPPORT)
80.0000 ug | PREFILLED_SYRINGE | INTRAVENOUS | Status: DC | PRN
  Filled 2018-12-11: qty 10

## 2018-12-11 MED ORDER — BUPIVACAINE HCL (PF) 0.5 % IJ SOLN
INTRAMUSCULAR | Status: AC
  Filled 2018-12-11: qty 30

## 2018-12-11 MED ORDER — PHENYLEPHRINE 40 MCG/ML (10ML) SYRINGE FOR IV PUSH (FOR BLOOD PRESSURE SUPPORT): 80.0000 ug | PREFILLED_SYRINGE | INTRAVENOUS | Status: DC | PRN

## 2018-12-11 MED ORDER — DEXAMETHASONE SODIUM PHOSPHATE 4 MG/ML IJ SOLN
INTRAMUSCULAR | Status: AC
  Filled 2018-12-11: qty 1

## 2018-12-11 MED ORDER — PHENYLEPHRINE 40 MCG/ML (10ML) SYRINGE FOR IV PUSH (FOR BLOOD PRESSURE SUPPORT)
PREFILLED_SYRINGE | INTRAVENOUS | Status: AC
  Filled 2018-12-11: qty 10

## 2018-12-11 MED ORDER — OXYTOCIN 40 UNITS IN NORMAL SALINE INFUSION - SIMPLE MED
2.5000 [IU]/h | INTRAVENOUS | Status: DC
  Administered 2018-12-12: 2.5 [IU]/h via INTRAVENOUS
  Filled 2018-12-11: qty 1000

## 2018-12-11 MED ORDER — LACTATED RINGERS IV SOLN
500.0000 mL | INTRAVENOUS | Status: DC | PRN
  Administered 2018-12-11: 500 mL via INTRAVENOUS

## 2018-12-11 MED ORDER — CEFAZOLIN SODIUM-DEXTROSE 2-4 GM/100ML-% IV SOLN: 2.0000 g | INTRAVENOUS | Status: DC

## 2018-12-11 MED ORDER — DIPHENHYDRAMINE HCL 50 MG/ML IJ SOLN: 12.5000 mg | INTRAMUSCULAR | Status: DC | PRN

## 2018-12-11 MED ORDER — MORPHINE SULFATE (PF) 0.5 MG/ML IJ SOLN
INTRAMUSCULAR | Status: AC
  Filled 2018-12-11: qty 10

## 2018-12-11 MED ORDER — OXYCODONE-ACETAMINOPHEN 5-325 MG PO TABS: 1.0000 | ORAL_TABLET | ORAL | Status: DC | PRN

## 2018-12-11 MED ORDER — LIDOCAINE HCL (PF) 1 % IJ SOLN
INTRAMUSCULAR | Status: DC | PRN
  Administered 2018-12-11 (×2): 6 mL via EPIDURAL

## 2018-12-11 NOTE — H&P (Signed)
Obstetric History and Physical  Lindsay Ramirez is a 33 y.o. [phone] with IUP at [redacted]w[redacted]d presenting for scheduled repeat c-section. She is doing well with no complaints today.   Prenatal Course Source of Care: [facility] with onset of care at 10 weeks Pregnancy complications or risks: Patient Active Problem List   Diagnosis Date Noted  . S/P cesarean section 12/11/2018  . Depression affecting pregnancy in second trimester, antepartum 10/06/2018  . Sickle cell trait ([place]) 06/17/2018  . Supervision of other normal pregnancy, antepartum 05/20/2018  . Previous preterm delivery, antepartum 05/20/2018  . Previous cesarean delivery, antepartum 09/19/2014  . Migraine without aura 03/25/2014  . Gout 06/15/2013  . Bipolar disorder, unspecified/depression 11/11/2012  . Anemia affecting pregnancy in second trimester 09/28/2012  . 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 Acid reflux 06/21/2012   She plans to bottle feed She desires nexplanon for postpartum contraception.   Prenatal labs and studies: ABO, Rh: O/Positive/-- (09/12 1100) Antibody: Negative (09/12 1100) Rubella: 6.91 (09/12 1100) RPR: Non Reactive (01/16 1105)  HBsAg: Negative (09/12 1100)  HIV: Non Reactive (01/16 1105)  QQV:ZDGLOVFI (03/16 1151) 2 hr GTT:  70/105/91 Genetic screening normal Anatomy US normal  Medical History:  Past Medical History:  Diagnosis Date  . Abnormal Pap smear    LAST PAP 06/2011  . Anemia    CHRONIC  . Asthma    rarely uses inhaler,  [facility]  . Bipolar affective disorder ([place])   . Bipolar affective disorder ([place]) 2006   TOOK MEDS X 3 YR  . Depression    PP 2013  . Gout    feet - no meds  . Headache(784.0)    otc med prn  . Infection    Hx -UTI  . Mental disorder   . Missed ab 10/2013  . Sickle cell trait ([place])   . SVD (spontaneous vaginal delivery)    x 4  . Vaginal Pap smear, abnormal     Past Surgical History:  Procedure Laterality Date  . CESAREAN SECTION    .  CESAREAN SECTION MULTI-GESTATIONAL N/A 09/19/2014   Procedure: CESAREAN SECTION MULTI-GESTATIONAL;  Surgeon: Shelly Bombard, MD;  Location: [facility];  Service: Obstetrics;  Laterality: N/A;  . DILATION AND CURETTAGE OF UTERUS    . DILATION AND EVACUATION N/A 11/04/2013   Procedure: DILATATION AND EVACUATION;  Surgeon: Alwyn Pea, MD;  Location: [facility];  Service: Gynecology;  Laterality: N/A;  . MOUTH SURGERY     wisdom teeth ext  . VAGINAL DELIVERY     x 4  . WISDOM TOOTH EXTRACTION  2012    OB History  Gravida Para Term Preterm AB Living  7 5 3 2 1 6   SAB TAB Ectopic Multiple Live Births  1 0 0 1 6    # Outcome Date GA Lbr Len/2nd Weight Sex Delivery Anes PTL Lv  7 Current           6A Preterm 09/19/14 [redacted]w[redacted]d  2290 g F CS-LTranv Spinal  LIV  6B Preterm 09/19/14 [redacted]w[redacted]d  2150 g M CS-LTranv Spinal  LIV  5 SAB 2015          4 Preterm 12/12/12 [redacted]w[redacted]d 10:18 / 00:06 2605 g M Vag-Spont EPI  LIV     Birth Comments: none  3 Term 02/14/12 [redacted]w[redacted]d 07:40 / 00:18 3120 g M Vag-Spont EPI  LIV     Birth Comments: WNL  2 Term 03/2011 [redacted]w[redacted]d 06:00 3260 g F Vag-Spont EPI  LIV  1 Term 07/2006 [redacted]w[redacted]d 08:00 3345 g F Vag-Spont EPI  LIV    Social History   Socioeconomic History  . Marital status: Single    Spouse name: Not on file  . Number of children: 3  . Years of education: 105  . Highest education level: Not on file  Occupational History  . Occupation: Lexicographer: [employer]  . 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 Financial resource strain: Not hard at all  . Food insecurity:    Worry: Never true    Inability: Never true  . Transportation needs:    Medical: No    Non-medical: Not on file  Tobacco Use  . Smoking status: Never Smoker  . Smokeless tobacco: Never Used  Substance and Sexual Activity  . Alcohol use: No    Alcohol/week: 0.0 standard drinks  . Drug use: No  . Sexual activity: Yes    Partners: Male  Lifestyle  . Physical activity:    Days per week:  Not on file    Minutes per session: Not on file  . Stress: Not at all  Relationships  . Social connections:    Talks on phone: Not on file    Gets together: Not on file    Attends religious service: Not on file    Active member of club or organization: Not on file    Attends meetings of clubs or organizations: Not on file    Relationship status: Not on file  Other Topics Concern  . Not on file  Social History Narrative  . Not on file    Family History  Problem Relation Age of Onset  . Hypertension Mother   . Asthma Mother   . Heart disease Mother   . Hyperlipidemia Mother   . Thyroid disease Mother   . Alcohol abuse Father   . Cancer Maternal Grandmother 37       COLON  . Depression Maternal Grandmother   . Alcohol abuse Maternal Grandmother   . Drug abuse Maternal Grandmother   . Alcohol abuse Maternal Grandfather   . Drug abuse Maternal Grandfather   . Stroke Maternal Grandfather   . Alcohol abuse Paternal Grandfather   . Drug abuse Paternal Grandfather   . Asthma Sister   . Depression Sister   . Diabetes Maternal Aunt   . HIV Maternal Aunt   . Asthma Sister     Medications Prior to Admission  Medication Sig Dispense Refill Last Dose  . acetaminophen (TYLENOL) 500 MG tablet Take 1,000 mg by mouth every 6 (six) hours as needed for moderate pain or headache.    Taking  . ondansetron (ZOFRAN ODT) 4 MG disintegrating tablet Take 1 tablet (4 mg total) by mouth every 6 (six) hours as needed for nausea. 20 tablet 0 Taking  . ferrous sulfate (FERROUSUL) 325 (65 FE) MG tablet Take 1 tablet (325 mg total) by mouth 2 (two) times daily. (Patient not taking: Reported on 12/09/2018) 60 tablet 1 Not Taking at Unknown time    No Known Allergies  Review of Systems: Negative except for what is mentioned in HPI.  Physical Exam: BP 101/66 (BP Location: Left Arm)   Pulse 81   Temp 98.6 F (37 C) (Oral)   Resp 16   Ht 5\' 2"  (1.575 m)   Wt 98.9 kg   LMP 03/09/2018 (Exact Date)    BMI 39.87 kg/m  CONSTITUTIONAL: Well-developed, well-nourished female in no acute distress.  HENT:  Normocephalic, atraumatic, External right and left ear normal. Oropharynx is clear and moist EYES: Conjunctivae and EOM are normal. Pupils are equal, round, and reactive to light. No scleral icterus.  NECK: Normal range of motion, supple, no masses SKIN: Skin is warm and dry. No rash noted. Not diaphoretic. No erythema. No pallor. NEUROLOGIC: Alert and oriented to person, place, and time. Normal reflexes, muscle tone coordination. No cranial nerve deficit noted. PSYCHIATRIC: Normal mood and affect. Normal behavior. Normal judgment and thought content. CARDIOVASCULAR: Normal heart rate noted, regular rhythm RESPIRATORY: Effort and breath sounds normal, no problems with respiration noted ABDOMEN: Soft, nontender, nondistended, gravid. MUSCULOSKELETAL: Normal range of motion. No edema and no tenderness. 2+ distal pulses.    Pertinent Labs/Studies:   Results for orders placed or performed during the hospital encounter of 12/11/18 (from the past 24 hour(s))  CBC     Status: Abnormal   Collection Time: 12/11/18  7:35 AM  Result Value Ref Range   WBC 7.9 4.0 - 10.5 K/uL   RBC 3.89 3.87 - 5.11 MIL/uL   Hemoglobin 9.8 (L) 12.0 - 15.0 g/dL   HCT 30.4 (L) 36.0 - 46.0 %   MCV 78.1 (L) 80.0 - 100.0 fL   MCH 25.2 (L) 26.0 - 34.0 pg   MCHC 32.2 30.0 - 36.0 g/dL   RDW 15.2 11.5 - 15.5 %   Platelets 194 150 - 400 K/uL   nRBC 0.0 0.0 - 0.2 %    Assessment : Lindsay Ramirez is a 34 y.o. [id number] at [redacted]w[redacted]d being admitted for scheduled repeat c-section. Patient had chosen c-section when she thought she would not be induced with one prior c-section. I reviewed that she could be induced with one prior c-section. Reviewed risks/benefits of TOLAC versus RCS in detail. Patient counseled regarding potential vaginal delivery, chance of success, future implications, possible uterine rupture and need for  urgent/emergent repeat cesarean. Counseled regarding potential need for repeat c-section for reasons unrelated to first c-section. Counseled regarding scheduled repeat cesarean including risks of bleeding, infection, damage to surrounding tissue, abnormal placentation, implications for future pregnancies. All questions answered.  Patient desires TOLAC.  Plan:  Labor  - Admit to labor and delivery - Induction as ordered as per protocol - Analgesia as needed  FWB  - will place on NST   - GBS negative  Delivery plan - Hopeful for vaginal delivery    K. Arvilla Meres, M.D. Attending [facility] [employer] (Faculty Practice)  12/11/2018, 10:02 AM

## 2018-12-11 NOTE — Progress Notes (Signed)
Lindsay Ramirez is a 33 y.o. [id number] at [redacted]w[redacted]d by ultrasound admitted for induction of labor due to Elective at term, planned C/S initially but decided on admission for TOLAC.  Subjective: Pt comfortable with epidural.  Family member in room for support.  Objective: BP 104/67 (BP Location: Left Arm)   Pulse 61   Temp 98.8 F (37.1 C) (Oral)   Resp 16   Ht 5\' 2"  (1.575 m)   Wt 98.9 kg   LMP 03/09/2018 (Exact Date)   SpO2 100%   BMI 39.87 kg/m  No intake/output data recorded. Total I/O In: 240 [P.O.:240] Out: -   FHT:  FHR: 140 bpm, variability: moderate with periods of minimal,  accelerations:  Abscent,  decelerations:  Present early UC:   regular, every 3-5 minutes SVE:   Dilation: 4 Effacement (%): 70 Station: -3 Exam by:: [employer] RN  Labs: Lab Results  Component Value Date   WBC 8.4 12/11/2018   HGB 9.9 (L) 12/11/2018   HCT 32.6 (L) 12/11/2018   MCV 78.2 (L) 12/11/2018   PLT 188 12/11/2018    Assessment / Plan: Induction of labor due to elective at term,  progressing well on pitocin  Labor: Progressing normally Preeclampsia:  n/a Fetal Wellbeing:  Category I Pain Control:  Epidural I/D:  GBS neg Anticipated MOD:  VBAC  Lisa Leftwich-Kirby 12/11/2018, 5:14 PM

## 2018-12-11 NOTE — Anesthesia Procedure Notes (Signed)
Epidural Patient location during procedure: OB Start time: 12/11/2018 4:34 PM End time: 12/11/2018 4:38 PM  Staffing Anesthesiologist: Lyn Hollingshead, MD Performed: anesthesiologist   Preanesthetic Checklist Completed: patient identified, site marked, surgical consent, pre-op evaluation, timeout performed, IV checked, risks and benefits discussed and monitors and equipment checked  Epidural Patient position: sitting Prep: site prepped and draped and DuraPrep Patient monitoring: continuous pulse ox and blood pressure Approach: midline Location: L3-L4 Injection technique: LOR air  Needle:  Needle type: Tuohy  Needle gauge: 17 G Needle length: 9 cm and 9 Needle insertion depth: 7 cm Catheter type: closed end flexible Catheter size: 19 Gauge Catheter at skin depth: 12 cm Test dose: negative and Other  Assessment Sensory level: T9 Events: blood not aspirated, injection not painful, no injection resistance, negative IV test and no paresthesia

## 2018-12-11 NOTE — Anesthesia Preprocedure Evaluation (Signed)
Anesthesia Evaluation  Patient identified by MRN, date of birth, ID band Patient awake    Reviewed: Allergy & Precautions, H&P , NPO status , Patient's Chart, lab work & pertinent test results  Airway Mallampati: III  TM Distance: >3 FB Neck ROM: full    Dental no notable dental hx. (+) Teeth Intact   Pulmonary asthma ,    Pulmonary exam normal breath sounds clear to auscultation       Cardiovascular negative cardio ROS Normal cardiovascular exam Rhythm:regular Rate:Normal     Neuro/Psych  Headaches, PSYCHIATRIC DISORDERS Depression Bipolar Disorder    GI/Hepatic Neg liver ROS, GERD  ,  Endo/Other  Morbid obesity  Renal/GU negative Renal ROS  negative genitourinary   Musculoskeletal   Abdominal (+) + obese,   Peds  Hematology  (+) Blood dyscrasia, anemia ,   Anesthesia Other Findings   Reproductive/Obstetrics (+) Pregnancy                             Anesthesia Physical  Anesthesia Plan  ASA: III  Anesthesia Plan: Epidural   Post-op Pain Management:    Induction:   PONV Risk Score and Plan:   Airway Management Planned:   Additional Equipment:   Intra-op Plan:   Post-operative Plan:   Informed Consent: I have reviewed the patients History and Physical, chart, labs and discussed the procedure including the risks, benefits and alternatives for the proposed anesthesia with the patient or authorized representative who has indicated his/her understanding and acceptance.       Plan Discussed with: CRNA  Anesthesia Plan Comments:         Anesthesia Quick Evaluation

## 2018-12-11 NOTE — Anesthesia Preprocedure Evaluation (Signed)
Anesthesia Evaluation  Patient identified by MRN, date of birth, ID band Patient awake    Reviewed: Allergy & Precautions, H&P , NPO status , Patient's Chart, lab work & pertinent test results  Airway Mallampati: III  TM Distance: >3 FB Neck ROM: full    Dental no notable dental hx. (+) Teeth Intact   Pulmonary asthma ,    Pulmonary exam normal breath sounds clear to auscultation       Cardiovascular negative cardio ROS Normal cardiovascular exam Rhythm:regular Rate:Normal     Neuro/Psych  Headaches, PSYCHIATRIC DISORDERS Depression Bipolar Disorder    GI/Hepatic Neg liver ROS, GERD  ,  Endo/Other  Morbid obesity  Renal/GU negative Renal ROS  negative genitourinary   Musculoskeletal   Abdominal (+) + obese,   Peds  Hematology  (+) Blood dyscrasia, anemia ,   Anesthesia Other Findings   Reproductive/Obstetrics (+) Pregnancy                             Anesthesia Physical Anesthesia Plan  ASA: III  Anesthesia Plan: Spinal   Post-op Pain Management:    Induction:   PONV Risk Score and Plan: Ondansetron, Dexamethasone and Scopolamine patch - Pre-op  Airway Management Planned: Natural Airway and Nasal Cannula  Additional Equipment:   Intra-op Plan:   Post-operative Plan:   Informed Consent: I have reviewed the patients History and Physical, chart, labs and discussed the procedure including the risks, benefits and alternatives for the proposed anesthesia with the patient or authorized representative who has indicated his/her understanding and acceptance.       Plan Discussed with: CRNA  Anesthesia Plan Comments:         Anesthesia Quick Evaluation

## 2018-12-11 NOTE — Progress Notes (Signed)
OB/GYN Faculty Practice: Labor Progress Note  Subjective: Doing well, mother had to go home for the day. Comfortable with epidural.   Objective: BP 102/66   Pulse (!) 56   Temp 98.8 F (37.1 C) (Oral)   Resp 16   Ht 5\' 2"  (1.575 m)   Wt 98.9 kg   LMP 03/09/2018 (Exact Date)   SpO2 100%   BMI 39.87 kg/m  Gen: well-appearing, NAD Dilation: 5 Effacement (%): 70 Station: -2 Presentation: Vertex Exam by:: Dr Danley Danker  Assessment and Plan: 33 y.o. [id number] [redacted]w[redacted]d here for elective IOL. Was supposed to be scheduled C/S this morning but with only one prior is able to be induced.   Labor: Induction started this afternoon with pitocin. TOLAC consent with Dr. Rosana Hoes this morning. Discussed AROM and patient amenable - light meconium.  -- pain control: epidural in place -- PPH Risk: medium   Fetal Well-Being: EFW 6-7lbs by Leopold's. Cephalic by BSUS, sutures.  -- Category I - continuous fetal monitoring  -- GBS negative    Drezden Seitzinger S. Juleen China, DO OB/GYN Fellow, Faculty Practice  9:34 PM

## 2018-12-12 ENCOUNTER — Encounter (HOSPITAL_COMMUNITY): Payer: Self-pay | Admitting: *Deleted

## 2018-12-12 DIAGNOSIS — O34211 Maternal care for low transverse scar from previous cesarean delivery: Secondary | ICD-10-CM

## 2018-12-12 DIAGNOSIS — Z3A39 39 weeks gestation of pregnancy: Secondary | ICD-10-CM

## 2018-12-12 LAB — RPR: RPR Ser Ql: NONREACTIVE

## 2018-12-12 MED ORDER — MEASLES, MUMPS & RUBELLA VAC IJ SOLR: 0.5000 mL | Freq: Once | INTRAMUSCULAR | Status: DC

## 2018-12-12 MED ORDER — METHYLERGONOVINE MALEATE 0.2 MG/ML IJ SOLN
INTRAMUSCULAR | Status: AC
  Filled 2018-12-12: qty 1

## 2018-12-12 MED ORDER — ONDANSETRON HCL 4 MG/2ML IJ SOLN
4.0000 mg | INTRAMUSCULAR | Status: DC | PRN
  Administered 2018-12-12: 4 mg via INTRAVENOUS
  Filled 2018-12-12: qty 2

## 2018-12-12 MED ORDER — ONDANSETRON HCL 4 MG PO TABS: 4.0000 mg | ORAL_TABLET | ORAL | Status: DC | PRN

## 2018-12-12 MED ORDER — BENZOCAINE-MENTHOL 20-0.5 % EX AERO
1.0000 "application " | INHALATION_SPRAY | CUTANEOUS | Status: DC | PRN
  Administered 2018-12-12: 1 via TOPICAL
  Filled 2018-12-12: qty 56

## 2018-12-12 MED ORDER — DIPHENHYDRAMINE HCL 25 MG PO CAPS: 25.0000 mg | ORAL_CAPSULE | Freq: Four times a day (QID) | ORAL | Status: DC | PRN

## 2018-12-12 MED ORDER — IBUPROFEN 600 MG PO TABS
600.0000 mg | ORAL_TABLET | Freq: Four times a day (QID) | ORAL | Status: DC
  Administered 2018-12-12 – 2018-12-13 (×5): 600 mg via ORAL
  Filled 2018-12-12 (×5): qty 1

## 2018-12-12 MED ORDER — PANTOPRAZOLE SODIUM 40 MG IV SOLR
40.0000 mg | Freq: Once | INTRAVENOUS | Status: AC
  Administered 2018-12-12: 40 mg via INTRAVENOUS
  Filled 2018-12-12: qty 40

## 2018-12-12 MED ORDER — PRENATAL MULTIVITAMIN CH
1.0000 | ORAL_TABLET | Freq: Every day | ORAL | Status: DC
  Administered 2018-12-12: 1 via ORAL
  Filled 2018-12-12: qty 1

## 2018-12-12 MED ORDER — SENNOSIDES-DOCUSATE SODIUM 8.6-50 MG PO TABS
2.0000 | ORAL_TABLET | ORAL | Status: DC
  Filled 2018-12-12: qty 2

## 2018-12-12 MED ORDER — ZOLPIDEM TARTRATE 5 MG PO TABS: 5.0000 mg | ORAL_TABLET | Freq: Every evening | ORAL | Status: DC | PRN

## 2018-12-12 MED ORDER — DIBUCAINE 1 % RE OINT: 1.0000 "application " | TOPICAL_OINTMENT | RECTAL | Status: DC | PRN

## 2018-12-12 MED ORDER — SIMETHICONE 80 MG PO CHEW: 80.0000 mg | CHEWABLE_TABLET | ORAL | Status: DC | PRN

## 2018-12-12 MED ORDER — METHYLERGONOVINE MALEATE 0.2 MG/ML IJ SOLN
0.2000 mg | Freq: Once | INTRAMUSCULAR | Status: AC
  Administered 2018-12-12: 0.2 mg via INTRAMUSCULAR

## 2018-12-12 MED ORDER — WITCH HAZEL-GLYCERIN EX PADS: 1.0000 "application " | MEDICATED_PAD | CUTANEOUS | Status: DC | PRN

## 2018-12-12 MED ORDER — TETANUS-DIPHTH-ACELL PERTUSSIS 5-2.5-18.5 LF-MCG/0.5 IM SUSP: 0.5000 mL | Freq: Once | INTRAMUSCULAR | Status: DC

## 2018-12-12 MED ORDER — COCONUT OIL OIL: 1.0000 "application " | TOPICAL_OIL | Status: DC | PRN

## 2018-12-12 MED ORDER — ACETAMINOPHEN 325 MG PO TABS
650.0000 mg | ORAL_TABLET | ORAL | Status: DC | PRN
  Administered 2018-12-12 (×4): 650 mg via ORAL
  Filled 2018-12-12 (×4): qty 2

## 2018-12-12 NOTE — Lactation Note (Signed)
This note was copied from a baby's chart. Lactation Consultation Note  Patient Name: Lindsay Ramirez Date: 12/12/2018 Reason for consult: Initial assessment;Term P6, 5 hour female infant. Mom's feeding choice at admission was breast and bottle feeding. Per mom, she did not breastfeed any of her other 5 children. Per mom, she only want to pump doesn't want latch infant to breast.  Mom is active on the [facility] program in [place].  Mom knows to use DEBP every 3 hours for 15 minutes on initial setting. Mom shown how to use DEBP & how to disassemble, clean, & reassemble parts. Mom knows to breastfeed infant according hunger cues, 8 or more times within 24 hours. LC discussed I & O. Reviewed Baby & Me book's Breastfeeding Basics.  Mom made aware of O/P services, breastfeeding support groups, community resources, and our phone # for post-discharge questions.  Maternal Data Formula Feeding for Exclusion: No Has patient been taught Hand Expression?: Yes Does the patient have breastfeeding experience prior to this delivery?: No  Feeding Feeding Type: Breast Fed Nipple Type: Slow - flow  LATCH Score                   Interventions Interventions: Breast feeding basics reviewed;DEBP;Hand express  Lactation Tools Discussed/Used WIC Program: Yes Pump Review: Setup, frequency, and cleaning;Milk Storage Initiated by:: Lindsay Ramirez, IBCLC Date initiated:: 12/12/18   Consult Status Consult Status: Follow-up Date: 12/13/18 Follow-up type: In-patient    Lindsay Ramirez 12/12/2018, 5:52 AM

## 2018-12-12 NOTE — Discharge Summary (Signed)
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 [redacted]w[redacted]d who was originally scheduled for repeat C/S but then opted for TOLAC and was admitted for elective induction of labor. Her pregnancy was complicated by above noted. Her labor course was notable for pitocin, SROM light meconium, epidural placement. Delivery was complicated by 1st degree and bilateral periurethral lacerations repaired. Please see delivery/op note for additional details. Her postpartum course was uncomplicated. She is formula feeding. By day of discharge, she was passing flatus, urinating, eating and drinking without difficulty. Her pain was well-controlled, and she was discharged home with ibuprofen OTC. She will follow-up in clinic in 4 weeks for postpartum care and Nexplanon placement.   Physical exam  Vitals:   12/12/18 1208 12/12/18 1713 12/12/18 2134 12/13/18 0530  BP: 117/70 101/61 (!) 106/56 97/71  Pulse: (!) 57 (!) 55 (!) 57 62  Resp: 18 19 18 18   Temp: 98 F (36.7 C) 97.7 F (36.5 C) 97.9 F (36.6 C) 97.9 F (36.6 C)  TempSrc: Axillary  Oral Oral  SpO2:  100%  100%  Weight:      Height:       General: alert, cooperative, no distress   Lochia: appropriate Uterine Fundus: firm Incision: N/A DVT Evaluation: No evidence of DVT seen on physical exam. No cords or calf tenderness. No significant calf/ankle edema. Labs: Lab Results  Component Value Date   WBC 8.4 12/11/2018   HGB 9.9 (L) 12/11/2018   HCT 32.6 (L) 12/11/2018   MCV 78.2 (L) 12/11/2018   PLT 188 12/11/2018   CMP Latest Ref Rng & Units 10/14/2018  Glucose 65 - 99 mg/dL 76  BUN 6 - 20 mg/dL 4(L)  Creatinine 0.57 - 1.00 mg/dL 0.59  Sodium 134 - 144 mmol/L 140  Potassium 3.5 - 5.2 mmol/L 3.4(L)  Chloride 96 - 106 mmol/L 107(H)  CO2 20 - 29 mmol/L 18(L)  Calcium 8.7 - 10.2 mg/dL 8.8  Total Protein 6.0 - 8.5 g/dL 6.1  Total Bilirubin 0.0 - 1.2 mg/dL 0.3  Alkaline Phos 39 - 117 IU/L 72  AST 0 - 40 IU/L 10  ALT 0 - 32 IU/L 7    Discharge instructions: Per After Visit Summary and "Baby and Me Booklet"  After visit meds:  Allergies as of 12/13/2018   No Known Allergies     Medication List    STOP taking these medications   ferrous sulfate 325 (65 FE) MG tablet Commonly known as:  FerrouSul     TAKE these medications   acetaminophen 500 MG  tablet Commonly known as:  TYLENOL Take 1,000 mg by mouth every 6 (six) hours as needed for moderate pain or headache.   ondansetron 4 MG disintegrating tablet Commonly known as:  Zofran ODT Take 1 tablet (4 mg total) by mouth every 6 (six) hours as needed for nausea.       Postpartum contraception: Nexplanon Diet: Routine Diet Activity: Advance as tolerated. Pelvic rest for 6 weeks.   Follow-up Appt: Future Appointments  Date Time Provider [place]  01/11/2019  1:15 PM Chancy Milroy, MD [place] None   Follow-up Visit:No follow-ups on file. Please schedule this patient for Postpartum visit in: 4 weeks with the following provider: Any provider Low risk pregnancy complicated by: hx C/S Delivery mode:  VBAC Anticipated Birth Control:  Nexplanon PP Procedures needed: none  Schedule  Integrated [place] visit: no  Newborn Data: Live born female  Birth Weight: 8 lb 0.2 oz (3635 g) APGAR: 8, 9  Newborn Delivery   Birth date/time:  12/12/2018 00:10:00 Delivery type:  Vaginal, Spontaneous     Baby Feeding: Bottle Disposition:home with mother  Lajean Manes, CNM 12/13/18, 7:33 AM

## 2018-12-12 NOTE — Anesthesia Postprocedure Evaluation (Signed)
Anesthesia Post Note  Patient: Lindsay Ramirez  Procedure(s) Performed: AN AD HOC LABOR EPIDURAL     Patient location during evaluation: Mother Baby Anesthesia Type: Epidural Level of consciousness: awake and alert and oriented Pain management: satisfactory to patient Vital Signs Assessment: post-procedure vital signs reviewed and stable Respiratory status: respiratory function stable Cardiovascular status: stable Postop Assessment: no headache, no backache, epidural receding, patient able to bend at knees, no signs of nausea or vomiting and adequate PO intake Anesthetic complications: no    Last Vitals:  Vitals:   12/12/18 0245 12/12/18 0345  BP: (!) 115/55 109/65  Pulse: 68 (!) 49  Resp: 18 18  Temp: 37 C 36.8 C  SpO2: 100% 100%    Last Pain:  Vitals:   12/12/18 0735  TempSrc:   PainSc: 7    Pain Goal:                   Lindsay Ramirez

## 2018-12-12 NOTE — Lactation Note (Signed)
This note was copied from a baby's chart. Lactation Consultation Note  Patient Name: Lindsay Ramirez IRSWN'I Date: 12/12/2018 Reason for consult: Follow-up assessment;Term  P6 mother whose infant is now 69 hours old.  Mother did not breast feed any of her other children.  Mother is interested in pumping only; does not wish to latch to the breast.  Mother was on the phone when I arrived.  She stated she had no questions about pumping and supplementing.  She will call as needed for concerns.   Maternal Data Formula Feeding for Exclusion: Yes Reason for exclusion: Mother's choice to formula and breast feed on admission Has patient been taught Hand Expression?: Yes Does the patient have breastfeeding experience prior to this delivery?: No  Feeding    LATCH Score                   Interventions    Lactation Tools Discussed/Used WIC Program: Yes Initiated by:: Already initiated   Consult Status Consult Status: Follow-up Date: 12/13/18 Follow-up type: In-patient    Diana Davenport R Baylea Milburn 12/12/2018, 2:35 PM

## 2018-12-13 ENCOUNTER — Telehealth: Payer: Self-pay | Admitting: Obstetrics and Gynecology

## 2018-12-13 NOTE — Progress Notes (Signed)
CSW acknowledges consult and completed clinical assessment.  Clinical documentation will follow.  There are no barriers to discharge.  Ollen Barges, [place]  [facility] [employer] [phone]

## 2018-12-13 NOTE — Clinical Social Work Maternal (Signed)
CLINICAL SOCIAL WORK MATERNAL/CHILD NOTE  Patient Details  Name: Lindsay Ramirez MRN: [id number] Date of Birth: 08/10/1986  Date:  Jan 04, 2019  Clinical Social Worker Initiating Note:  Ollen Barges Date/Time: Initiated:  12/13/18/0944     Child's Name:  [facility]   Biological Parents:  Mother(Lindsay Ramirez)   Need for Interpreter:  None   Reason for Referral:  Behavioral Health Concerns(hsitory of bipolar disorder and depression)   Address:  [place] [address]    Phone number:  [phone] (home)     Additional phone number:   Household Members/Support Persons (HM/SP):   Household Member/Support Person 1, Household Member/Support Person 2, Household Member/Support Person 3, Household Member/Support Person 4, Household Member/Support Person 5, Household Member/Support Person 6   HM/SP Name Relationship DOB or Age  HM/SP -1 Saurabh Hettich Daughter 08/03/2006  HM/SP -[place] Daughter 03/10/2011  HM/SP -[place] Son 02/14/2012  HM/SP -[place] Son 09/19/2014  HM/SP -5 Cory Roughen [place] Daughter 09/19/2014  HM/SP -6        HM/SP -7        HM/SP -8          Natural Supports (not living in the home):  Immediate Family, Artist Supports:     Employment: Part-time   Type of Work: Aide with [employer]   Education:  High school graduate   Homebound arranged:    Museum/gallery curator Resources:  Medicaid   Other Resources:  [employer], Physicist, medical    Cultural/Religious Considerations Which May Impact Care:   Strengths:  Ability to meet basic needs , Home prepared for child (Hasn't decided on pediatrician yet but was provided a list)   Psychotropic Medications:         Pediatrician:       Pediatrician List:   [place]      Pediatrician Fax Number:    Risk Factors/Current Problems:  Mental Health  Concerns    Cognitive State:  Able to Concentrate , Alert , Insightful , Linear Thinking    Mood/Affect:  Calm , Comfortable , Happy , Interested , Relaxed    CSW Assessment: CSW received consult for history of depression and bipolar disorder. CSW met with MOB to offer support and complete assessment.    MOB sitting up in bed watching infant sleep when CSW entered the room. CSW introduced self and role and explained reason for consult. MOB expressed understanding. Per MOB, she is currently living in [place] with her 5 other children. 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 MOB reported she receives [facility] and food stamps and is aware of the process to get infant added on to plan. Per MOB, she has a history of bipolar depression and stated she was diagnosed around 2007. MOB reported her primary symptom as isolating from people and only wanting to be around her children. MOB stated she is not currently on medications and has been "fine". MOB reported she received some counseling after her twins were born but has not had any recently. MOB reported listening to music and being around her children help when she is feeling down. MOB denied having any PPD or PPA with any of her other children.   CSW provided education regarding the baby blues period  vs. perinatal mood disorders, discussed treatment and gave resources for mental health follow up if concerns arise.  CSW recommends self-evaluation during the postpartum time period using the New Mom Checklist from Postpartum Progress and encouraged MOB to contact a medical professional if symptoms are noted at any time.  MOB denied any current SI, HI or DV.   MOB reported having all essential items for baby once discharged and that baby would be sleeping in a basinet once home. MOB stated she is knowledgeable of safe sleeping habits. MOB denied having any concerns or questions for CSW at this time.     CSW Plan/Description:  No Further Intervention Required/No Barriers to Discharge, Sudden Infant Death Syndrome (SIDS) Education, Perinatal Mood and Anxiety Disorder (PMADs) Education    Ollen Barges, [place] 2019/04/13, 11:23 AM

## 2018-12-13 NOTE — Telephone Encounter (Signed)
Telephone call to patient to inform her that during her office visit on 12/07/18 she may have been exposed to a health care worker who has an unknown respiratory illness.  The employee had no fever but out of caution, we are notifying you so that you can be on alert for any of the following symptoms:  Fever, cough, sore throat shortness of breath or body aches.  If any of these symptoms occur between the date of your office visit and 12/21/18 please contact us at [phone] and notify your primary care physician.  Considering the current respiratory illness season, please continue to practice social distancing , stay home if possible, and quarantine yourself until 12/21/18.   Patient denied any current symptoms.

## 2018-12-16 ENCOUNTER — Encounter: Payer: Medicaid Other | Admitting: Obstetrics & Gynecology

## 2018-12-21 ENCOUNTER — Telehealth: Payer: Self-pay

## 2018-12-21 NOTE — Telephone Encounter (Signed)
MyChart message sent:  Hi Rada,  Regarding your message requesting a letter to return to work; these are usually done at your Postpartum Check up.  You had a C-Section 12/11/2018.  Please note that your Postpartum appointment is 01/11/2019 @ 1:15 pm

## 2018-12-24 ENCOUNTER — Encounter: Payer: Self-pay | Admitting: Medical

## 2018-12-24 ENCOUNTER — Other Ambulatory Visit: Payer: Self-pay

## 2018-12-24 ENCOUNTER — Ambulatory Visit (INDEPENDENT_AMBULATORY_CARE_PROVIDER_SITE_OTHER): Payer: Medicaid Other | Admitting: Medical

## 2018-12-24 ENCOUNTER — Encounter: Payer: Self-pay | Admitting: Obstetrics and Gynecology

## 2018-12-24 DIAGNOSIS — Z3480 Encounter for supervision of other normal pregnancy, unspecified trimester: Secondary | ICD-10-CM

## 2018-12-24 NOTE — Progress Notes (Signed)
TELEHEALTH VIRTUAL GYNECOLOGY VISIT ENCOUNTER NOTE  I connected with [name] on 12/24/18 at  9:45 AM EDT by telephone at home and verified that I am speaking with the correct person using two identifiers.   I discussed the limitations, risks, security and privacy concerns of performing an evaluation and management service by telephone and the availability of in person appointments. I also discussed with the patient that there may be a patient responsible charge related to this service. The patient expressed understanding and agreed to proceed.   History:  Lindsay Ramirez is a 33 y.o. [id number] female being evaluated today for return to work. She denies any abnormal vaginal bleeding, pelvic pain or other concerns.  She is 2 weeks PP after vaginal delivery. 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 She denies the need for heavy lifting or strenuous activity. She works 12 hour/week. She is bottle feeding and baby is without concerns.     Past Medical History:  Diagnosis Date  . Abnormal Pap smear    LAST PAP 06/2011  . Anemia    CHRONIC  . Asthma    rarely uses inhaler,  [facility]  . Bipolar affective disorder ([place])   . Bipolar affective disorder ([place]) 2006   TOOK MEDS X 3 YR  . Depression    PP 2013  . Gout    feet - no meds  . Headache(784.0)    otc med prn  . Infection    Hx -UTI  . Mental disorder   . Missed ab 10/2013  . Sickle cell trait ([place])   . SVD (spontaneous vaginal delivery)    x 4  . Vaginal Pap smear, abnormal    Past Surgical History:  Procedure Laterality Date  . CESAREAN SECTION    . CESAREAN SECTION MULTI-GESTATIONAL N/A 09/19/2014   Procedure: CESAREAN SECTION MULTI-GESTATIONAL;  Surgeon: Shelly Bombard, MD;  Location: [facility];  Service: Obstetrics;  Laterality: N/A;  . DILATION AND CURETTAGE OF UTERUS    . DILATION AND EVACUATION N/A 11/04/2013   Procedure: DILATATION AND EVACUATION;  Surgeon: Alwyn Pea, MD;  Location: [facility];  Service: Gynecology;  Laterality: N/A;  . MOUTH SURGERY     wisdom teeth ext  . VAGINAL DELIVERY     x 4  . WISDOM TOOTH EXTRACTION  2012   The following portions of the patient's history were reviewed and updated as appropriate: allergies, current medications, past family history, past medical history, past social history, past surgical history and problem list.   Review of Systems:  Pertinent items noted in HPI and remainder of comprehensive ROS otherwise negative.  Physical Exam:   General:  Alert, oriented and cooperative.   Mental Status: Normal mood and affect perceived. Normal judgment and thought content.  Physical exam deferred due to nature of the encounter     Assessment and Plan:     Patient may return to work at this time. Work release letter printed and available for the patient to pick up today before noon.       I discussed the assessment and treatment plan with the patient. The patient was provided an opportunity to ask questions and all were answered. The patient agreed with the plan and demonstrated an understanding of the instructions.  I provided 5 minutes of non-face-to-face time during this encounter.   Kerry Hough, PA-C [facility] [employer], [place]

## 2018-12-24 NOTE — Progress Notes (Signed)
Pt has PP scheduled pt want to go back to work.  Delivered 12/12/18

## 2019-01-11 ENCOUNTER — Ambulatory Visit (INDEPENDENT_AMBULATORY_CARE_PROVIDER_SITE_OTHER): Payer: Medicaid Other | Admitting: Obstetrics and Gynecology

## 2019-01-11 ENCOUNTER — Other Ambulatory Visit: Payer: Self-pay

## 2019-01-11 ENCOUNTER — Encounter: Payer: Self-pay | Admitting: Obstetrics and Gynecology

## 2019-01-11 VITALS — BP 131/78 | HR 64 | Wt 208.3 lb

## 2019-01-11 DIAGNOSIS — Z30017 Encounter for initial prescription of implantable subdermal contraceptive: Secondary | ICD-10-CM

## 2019-01-11 DIAGNOSIS — R300 Dysuria: Secondary | ICD-10-CM | POA: Insufficient documentation

## 2019-01-11 DIAGNOSIS — Z3202 Encounter for pregnancy test, result negative: Secondary | ICD-10-CM | POA: Diagnosis not present

## 2019-01-11 DIAGNOSIS — Z1389 Encounter for screening for other disorder: Secondary | ICD-10-CM

## 2019-01-11 LAB — POCT URINE PREGNANCY: Preg Test, Ur: NEGATIVE

## 2019-01-11 MED ORDER — ETONOGESTREL 68 MG ~~LOC~~ IMPL
68.0000 mg | DRUG_IMPLANT | Freq: Once | SUBCUTANEOUS | Status: AC
  Administered 2019-01-11: 68 mg via SUBCUTANEOUS

## 2019-01-11 NOTE — Patient Instructions (Signed)
Etonogestrel implant What is this medicine? ETONOGESTREL (et oh noe JES trel) is a contraceptive (birth control) device. It is used to prevent pregnancy. It can be used for up to 3 years. This medicine may be used for other purposes; ask your health care provider or pharmacist if you have questions. COMMON BRAND NAME(S): Implanon, Nexplanon What should I tell my health care provider before I take this medicine? They need to know if you have any of these conditions: -abnormal vaginal bleeding -blood vessel disease or blood clots -breast, cervical, endometrial, ovarian, liver, or uterine cancer -diabetes -gallbladder disease -heart disease or recent heart attack -high blood pressure -high cholesterol or triglycerides -kidney disease -liver disease -migraine headaches -seizures -stroke -tobacco smoker -an unusual or allergic reaction to etonogestrel, anesthetics or antiseptics, other medicines, foods, dyes, or preservatives -pregnant or trying to get pregnant -breast-feeding How should I use this medicine? This device is inserted just under the skin on the inner side of your upper arm by a health care professional. Talk to your pediatrician regarding the use of this medicine in children. Special care may be needed. Overdosage: If you think you have taken too much of this medicine contact a poison control center or emergency room at once. NOTE: This medicine is only for you. Do not share this medicine with others. What if I miss a dose? This does not apply. What may interact with this medicine? Do not take this medicine with any of the following medications: -amprenavir -fosamprenavir This medicine may also interact with the following medications: -acitretin -aprepitant -armodafinil -bexarotene -bosentan -carbamazepine -certain medicines for fungal infections like fluconazole, ketoconazole, itraconazole and voriconazole -certain medicines to treat hepatitis, HIV or  AIDS -cyclosporine -felbamate -griseofulvin -lamotrigine -modafinil -oxcarbazepine -phenobarbital -phenytoin -primidone -rifabutin -rifampin -rifapentine -St. John's wort -topiramate This list may not describe all possible interactions. Give your health care provider a list of all the medicines, herbs, non-prescription drugs, or dietary supplements you use. Also tell them if you smoke, drink alcohol, or use illegal drugs. Some items may interact with your medicine. What should I watch for while using this medicine? This product does not protect you against HIV infection (AIDS) or other sexually transmitted diseases. You should be able to feel the implant by pressing your fingertips over the skin where it was inserted. Contact your doctor if you cannot feel the implant, and use a non-hormonal birth control method (such as condoms) until your doctor confirms that the implant is in place. Contact your doctor if you think that the implant may have broken or become bent while in your arm. You will receive a user card from your health care provider after the implant is inserted. The card is a record of the location of the implant in your upper arm and when it should be removed. Keep this card with your health records. What side effects may I notice from receiving this medicine? Side effects that you should report to your doctor or health care professional as soon as possible: -allergic reactions like skin rash, itching or hives, swelling of the face, lips, or tongue -breast lumps, breast tissue changes, or discharge -breathing problems -changes in emotions or moods -if you feel that the implant may have broken or bent while in your arm -high blood pressure -pain, irritation, swelling, or bruising at the insertion site -scar at site of insertion -signs of infection at the insertion site such as fever, and skin redness, pain or discharge -signs and symptoms of a blood clot such  as breathing  problems; changes in vision; chest pain; severe, sudden headache; pain, swelling, warmth in the leg; trouble speaking; sudden numbness or weakness of the face, arm or leg -signs and symptoms of liver injury like dark yellow or brown urine; general ill feeling or flu-like symptoms; light-colored stools; loss of appetite; nausea; right upper belly pain; unusually weak or tired; yellowing of the eyes or skin -unusual vaginal bleeding, discharge Side effects that usually do not require medical attention (report to your doctor or health care professional if they continue or are bothersome): -acne -breast pain or tenderness -headache -irregular menstrual bleeding -nausea This list may not describe all possible side effects. Call your doctor for medical advice about side effects. You may report side effects to FDA at 1-800-FDA-1088. Where should I keep my medicine? This drug is given in a hospital or clinic and will not be stored at home. NOTE: This sheet is a summary. It may not cover all possible information. If you have questions about this medicine, talk to your doctor, pharmacist, or health care provider.  2019 Elsevier/Gold Standard (2017-07-14 14:11:42) Health Maintenance, Female Adopting a healthy lifestyle and getting preventive care can go a long way to promote health and wellness. Talk with your health care provider about what schedule of regular examinations is right for you. This is a good chance for you to check in with your provider about disease prevention and staying healthy. In between checkups, there are plenty of things you can do on your own. Experts have done a lot of research about which lifestyle changes and preventive measures are most likely to keep you healthy. Ask your health care provider for more information. Weight and diet Eat a healthy diet  Be sure to include plenty of vegetables, fruits, low-fat dairy products, and lean protein.  Do not eat a lot of foods high in  solid fats, added sugars, or salt.  Get regular exercise. This is one of the most important things you can do for your health. ? Most adults should exercise for at least 150 minutes each week. The exercise should increase your heart rate and make you sweat (moderate-intensity exercise). ? Most adults should also do strengthening exercises at least twice a week. This is in addition to the moderate-intensity exercise. Maintain a healthy weight  Body mass index (BMI) is a measurement that can be used to identify possible weight problems. It estimates body fat based on height and weight. Your health care provider can help determine your BMI and help you achieve or maintain a healthy weight.  For females 76 years of age and older: ? A BMI below 18.5 is considered underweight. ? A BMI of 18.5 to 24.9 is normal. ? A BMI of 25 to 29.9 is considered overweight. ? A BMI of 30 and above is considered obese. Watch levels of cholesterol and blood lipids  You should start having your blood tested for lipids and cholesterol at 32 years of age, then have this test every 5 years.  You may need to have your cholesterol levels checked more often if: ? Your lipid or cholesterol levels are high. ? You are older than 32 years of age. ? You are at high risk for heart disease. Cancer screening Lung Cancer  Lung cancer screening is recommended for adults 44-48 years old who are at high risk for lung cancer because of a history of smoking.  A yearly low-dose CT scan of the lungs is recommended for people who: ? Currently  smoke. ? Have quit within the past 15 years. ? Have at least a 30-pack-year history of smoking. A pack year is smoking an average of one pack of cigarettes a day for 1 year.  Yearly screening should continue until it has been 15 years since you quit.  Yearly screening should stop if you develop a health problem that would prevent you from having lung cancer treatment. Breast  Cancer  Practice breast self-awareness. This means understanding how your breasts normally appear and feel.  It also means doing regular breast self-exams. Let your health care provider know about any changes, no matter how small.  If you are in your 20s or 30s, you should have a clinical breast exam (CBE) by a health care provider every 1-3 years as part of a regular health exam.  If you are 40 or older, have a CBE every year. Also consider having a breast X-ray (mammogram) every year.  If you have a family history of breast cancer, talk to your health care provider about genetic screening.  If you are at high risk for breast cancer, talk to your health care provider about having an MRI and a mammogram every year.  Breast cancer gene (BRCA) assessment is recommended for women who have family members with BRCA-related cancers. BRCA-related cancers include: ? Breast. ? Ovarian. ? Tubal. ? Peritoneal cancers.  Results of the assessment will determine the need for genetic counseling and BRCA1 and BRCA2 testing. Cervical Cancer Your health care provider may recommend that you be screened regularly for cancer of the pelvic organs (ovaries, uterus, and vagina). This screening involves a pelvic examination, including checking for microscopic changes to the surface of your cervix (Pap test). You may be encouraged to have this screening done every 3 years, beginning at age 21.  For women ages 30-65, health care providers may recommend pelvic exams and Pap testing every 3 years, or they may recommend the Pap and pelvic exam, combined with testing for human papilloma virus (HPV), every 5 years. Some types of HPV increase your risk of cervical cancer. Testing for HPV may also be done on women of any age with unclear Pap test results.  Other health care providers may not recommend any screening for nonpregnant women who are considered low risk for pelvic cancer and who do not have symptoms. Ask your  health care provider if a screening pelvic exam is right for you.  If you have had past treatment for cervical cancer or a condition that could lead to cancer, you need Pap tests and screening for cancer for at least 20 years after your treatment. If Pap tests have been discontinued, your risk factors (such as having a new sexual partner) need to be reassessed to determine if screening should resume. Some women have medical problems that increase the chance of getting cervical cancer. In these cases, your health care provider may recommend more frequent screening and Pap tests. Colorectal Cancer  This type of cancer can be detected and often prevented.  Routine colorectal cancer screening usually begins at 32 years of age and continues through 32 years of age.  Your health care provider may recommend screening at an earlier age if you have risk factors for colon cancer.  Your health care provider may also recommend using home test kits to check for hidden blood in the stool.  A small camera at the end of a tube can be used to examine your colon directly (sigmoidoscopy or colonoscopy). This is done to   check for the earliest forms of colorectal cancer.  Routine screening usually begins at age 50.  Direct examination of the colon should be repeated every 5-10 years through 32 years of age. However, you may need to be screened more often if early forms of precancerous polyps or small growths are found. Skin Cancer  Check your skin from head to toe regularly.  Tell your health care provider about any new moles or changes in moles, especially if there is a change in a mole's shape or color.  Also tell your health care provider if you have a mole that is larger than the size of a pencil eraser.  Always use sunscreen. Apply sunscreen liberally and repeatedly throughout the day.  Protect yourself by wearing long sleeves, pants, a wide-brimmed hat, and sunglasses whenever you are outside. Heart  disease, diabetes, and high blood pressure  High blood pressure causes heart disease and increases the risk of stroke. High blood pressure is more likely to develop in: ? People who have blood pressure in the high end of the normal range (130-139/85-89 mm Hg). ? People who are overweight or obese. ? People who are African American.  If you are 18-39 years of age, have your blood pressure checked every 3-5 years. If you are 40 years of age or older, have your blood pressure checked every year. You should have your blood pressure measured twice-once when you are at a hospital or clinic, and once when you are not at a hospital or clinic. Record the average of the two measurements. To check your blood pressure when you are not at a hospital or clinic, you can use: ? An automated blood pressure machine at a pharmacy. ? A home blood pressure monitor.  If you are between 55 years and 79 years old, ask your health care provider if you should take aspirin to prevent strokes.  Have regular diabetes screenings. This involves taking a blood sample to check your fasting blood sugar level. ? If you are at a normal weight and have a low risk for diabetes, have this test once every three years after 32 years of age. ? If you are overweight and have a high risk for diabetes, consider being tested at a younger age or more often. Preventing infection Hepatitis B  If you have a higher risk for hepatitis B, you should be screened for this virus. You are considered at high risk for hepatitis B if: ? You were born in a country where hepatitis B is common. Ask your health care provider which countries are considered high risk. ? Your parents were born in a high-risk country, and you have not been immunized against hepatitis B (hepatitis B vaccine). ? You have HIV or AIDS. ? You use needles to inject street drugs. ? You live with someone who has hepatitis B. ? You have had sex with someone who has hepatitis  B. ? You get hemodialysis treatment. ? You take certain medicines for conditions, including cancer, organ transplantation, and autoimmune conditions. Hepatitis C  Blood testing is recommended for: ? Everyone born from 1945 through 1965. ? Anyone with known risk factors for hepatitis C. Sexually transmitted infections (STIs)  You should be screened for sexually transmitted infections (STIs) including gonorrhea and chlamydia if: ? You are sexually active and are younger than 32 years of age. ? You are older than 32 years of age and your health care provider tells you that you are at risk for this type of   infection. ? Your sexual activity has changed since you were last screened and you are at an increased risk for chlamydia or gonorrhea. Ask your health care provider if you are at risk.  If you do not have HIV, but are at risk, it may be recommended that you take a prescription medicine daily to prevent HIV infection. This is called pre-exposure prophylaxis (PrEP). You are considered at risk if: ? You are sexually active and do not regularly use condoms or know the HIV status of your partner(s). ? You take drugs by injection. ? You are sexually active with a partner who has HIV. Talk with your health care provider about whether you are at high risk of being infected with HIV. If you choose to begin PrEP, you should first be tested for HIV. You should then be tested every 3 months for as long as you are taking PrEP. Pregnancy  If you are premenopausal and you may become pregnant, ask your health care provider about preconception counseling.  If you may become pregnant, take 400 to 800 micrograms (mcg) of folic acid every day.  If you want to prevent pregnancy, talk to your health care provider about birth control (contraception). Osteoporosis and menopause  Osteoporosis is a disease in which the bones lose minerals and strength with aging. This can result in serious bone fractures. Your  risk for osteoporosis can be identified using a bone density scan.  If you are 65 years of age or older, or if you are at risk for osteoporosis and fractures, ask your health care provider if you should be screened.  Ask your health care provider whether you should take a calcium or vitamin D supplement to lower your risk for osteoporosis.  Menopause may have certain physical symptoms and risks.  Hormone replacement therapy may reduce some of these symptoms and risks. Talk to your health care provider about whether hormone replacement therapy is right for you. Follow these instructions at home:  Schedule regular health, dental, and eye exams.  Stay current with your immunizations.  Do not use any tobacco products including cigarettes, chewing tobacco, or electronic cigarettes.  If you are pregnant, do not drink alcohol.  If you are breastfeeding, limit how much and how often you drink alcohol.  Limit alcohol intake to no more than 1 drink per day for nonpregnant women. One drink equals 12 ounces of beer, 5 ounces of wine, or 1 ounces of hard liquor.  Do not use street drugs.  Do not share needles.  Ask your health care provider for help if you need support or information about quitting drugs.  Tell your health care provider if you often feel depressed.  Tell your health care provider if you have ever been abused or do not feel safe at home. This information is not intended to replace advice given to you by your health care provider. Make sure you discuss any questions you have with your health care provider. Document Released: 03/10/2011 Document Revised: 01/31/2016 Document Reviewed: 05/29/2015 Elsevier Interactive Patient Education  2019 Elsevier Inc.  

## 2019-01-11 NOTE — Progress Notes (Signed)
Post Partum Exam  Lindsay Ramirez is a 33 y.o. [phone] female who presents for a postpartum visit. She is 4 weeks postpartum following a spontaneous vaginal delivery. I have fully reviewed the prenatal and intrapartum course. The delivery was at 2 gestational weeks.  Anesthesia: epidural. Postpartum course has been unremarkable. Baby's course has been unremarkable. Baby is feeding by bottle - [employer] Good Start. Bleeding no bleeding. Bowel function is normal. Bladder function is normal. Patient is not sexually active. Contraception method is Nexplanon. Postpartum depression screening:neg (score 0)  The following portions of the patient's history were reviewed and updated as appropriate: allergies, current medications, past family history, past medical history, past social history, past surgical history and problem list. Last pap smear done 05/20/18 and was Normal  Review of Systems Pertinent items are noted in HPI.    Objective:  not currently breastfeeding.  General:  alert   Breasts:  not examined  Lungs: clear to auscultation bilaterally  Heart:  regular rate and rhythm, S1, S2 normal, no murmur, click, rub or gallop  Abdomen: soft, non-tender; bowel sounds normal; no masses,  no organomegaly   Vulva:  not evaluated  Vagina: not evaluated  Cervix:  not evaluated  Corpus: not examined  Adnexa:  not evaluated  Rectal Exam: Not performed.         Nexplanon Insertion Procedure Patient identified, informed consent performed, consent signed.   Patient does understand that irregular bleeding is a very common side effect of this medication. She was advised to have backup contraception for one week after placement. Pregnancy test in clinic today was negative.  Appropriate time out taken.  Patient's left arm was prepped and draped in the usual sterile fashion.. The ruler used to measure and mark insertion area.  Patient was prepped with alcohol swab and then injected with 3 ml of 1%  lidocaine.  She was prepped with betadine, Nexplanon removed from packaging,  Device confirmed in needle, then inserted full length of needle and withdrawn per handbook instructions. Nexplanon was able to palpated in the patient's arm; patient palpated the insert herself. There was minimal blood loss.  Patient insertion site covered with guaze and a pressure bandage to reduce any bruising.  The patient tolerated the procedure well and was given post procedure instructions.              Assessment:    NL postpartum exam. Pap smear not done at today's visit.   Plan:   1. Contraception: Nexplanon 2. Return to nl ADL's 3. Follow up in: 1 yr or as needed.

## 2019-01-13 LAB — URINE CULTURE: Organism ID, Bacteria: NO GROWTH

## 2019-01-18 ENCOUNTER — Telehealth: Payer: Self-pay | Admitting: *Deleted

## 2019-01-18 NOTE — Telephone Encounter (Signed)
Pt called to office to discuss Nexplanon.  Return call to pt.  Pt states she is still having some soreness at Nexplanon site.  Wants to know if this is normal.  Pt denies any redness, irritation or swelling.  Pt made aware she may have some soreness at site. Pt made aware may take a week or so for soreness and entry site to heal. Pt made aware that she may take Ibuprofen/Tylenol as needed to help. Pt advised to monitor and if any signs of infection/swelling/redness to contact office.   Pt also made aware of UC test results.    Pt states understanding.

## 2019-01-18 NOTE — Telephone Encounter (Signed)
Pt needs appt to check Implanon insertion site Thanks Legrand Como

## 2019-01-19 NOTE — Telephone Encounter (Signed)
Pt made aware and was transferred to scheduling.

## 2019-01-26 ENCOUNTER — Ambulatory Visit: Payer: Medicaid Other | Admitting: Obstetrics and Gynecology

## 2019-04-19 ENCOUNTER — Other Ambulatory Visit: Payer: Medicaid Other

## 2019-09-25 ENCOUNTER — Other Ambulatory Visit: Payer: Self-pay

## 2019-09-25 ENCOUNTER — Emergency Department (HOSPITAL_COMMUNITY): Payer: Medicaid Other

## 2019-09-25 ENCOUNTER — Emergency Department (HOSPITAL_COMMUNITY)
Admission: EM | Admit: 2019-09-25 | Discharge: 2019-09-25 | Disposition: A | Discharge status: Home / Self Care | Payer: Medicaid Other | Attending: Emergency Medicine | Admitting: Emergency Medicine

## 2019-09-25 ENCOUNTER — Encounter (HOSPITAL_COMMUNITY): Payer: Self-pay | Admitting: Emergency Medicine

## 2019-09-25 DIAGNOSIS — L03032 Cellulitis of left toe: Secondary | ICD-10-CM | POA: Insufficient documentation

## 2019-09-25 DIAGNOSIS — J45909 Unspecified asthma, uncomplicated: Secondary | ICD-10-CM | POA: Diagnosis not present

## 2019-09-25 DIAGNOSIS — M79675 Pain in left toe(s): Secondary | ICD-10-CM | POA: Diagnosis present

## 2019-09-25 MED ORDER — LIDOCAINE HCL (PF) 1 % IJ SOLN
5.0000 mL | Freq: Once | INTRAMUSCULAR | Status: AC
  Administered 2019-09-25: 17:00:00 5 mL
  Filled 2019-09-25: qty 5

## 2019-09-25 NOTE — ED Notes (Signed)
Patient transported to X-ray 

## 2019-09-25 NOTE — Discharge Instructions (Signed)
Please read and follow all provided instructions.  Your diagnoses today include:  1. Paronychia of fourth toe, left     Tests performed today include:  Vital signs. See below for your results today.   X-ray of your toe-does not show any broken bones  Medications prescribed:  Use Tylenol or ibuprofen as directed on packaging for pain.  Take any prescribed medications only as directed.   Home care instructions:   Follow any educational materials contained in this packet  Keep your foot elevated as much as possible over the next 24 hours.  Continue warm soaks a couple times a day.  Follow-up instructions: Return to the Emergency Department in 48-72 hours for a recheck if your symptoms are not significantly improved.  Return instructions:  Return to the Emergency Department if you have:  Fever  Worsening symptoms  Worsening pain  Worsening swelling  Redness of the skin that moves away from the affected area, especially if it streaks away from the affected area   Any other emergent concerns  Your vital signs today were: BP 123/79 (BP Location: Left Arm)   Pulse (!) 54   Temp 98.1 F (36.7 C) (Oral)   Resp 16   LMP 08/30/2019   SpO2 100%  If your blood pressure (BP) was elevated above 135/85 this visit, please have this repeated by your doctor within one month. --------------

## 2019-09-25 NOTE — ED Notes (Signed)
Pt verbalized understanding of discharge paperwork and follow-up care.  °

## 2019-09-25 NOTE — ED Triage Notes (Signed)
C/o L 4th toe pain after pulling too much of her nail off 1 1/2 weeks ago.

## 2019-09-25 NOTE — ED Provider Notes (Signed)
[facility] EMERGENCY DEPARTMENT Provider Note   CSN: [id number] Arrival date & time: 09/25/19  1357     History Chief Complaint  Patient presents with  . Toe Pain    Lindsay Ramirez is a 34 y.o. female.  Patient with history of gout, bipolar disorder presents to the emergency department with complaint of left fourth toe pain ongoing over about 2 weeks.  She states that she had a hangnail and manipulated the nail about the time the symptoms started.  She then developed swelling and gradually worsening pain in her toe.  She has not had any drainage.  She struck the toe against an object yesterday causing more pain.  Walking is now more difficult due to pain.  No fevers.        Past Medical History:  Diagnosis Date  . Abnormal Pap smear    LAST PAP 06/2011  . Anemia    CHRONIC  . Asthma    rarely uses inhaler,  [facility]  . Bipolar affective disorder ([place])   . Bipolar affective disorder ([place]) 2006   TOOK MEDS X 3 YR  . Depression    PP 2013  . Gout    feet - no meds  . Headache(784.0)    otc med prn  . Infection    Hx -UTI  . Mental disorder   . Missed ab 10/2013  . Sickle cell trait ([place])   . SVD (spontaneous vaginal delivery)    x 4  . Vaginal Pap smear, abnormal     Patient Active Problem List   Diagnosis Date Noted  . Postpartum care following cesarean delivery 01/11/2019  . Dysuria 01/11/2019  . S/P cesarean section 12/11/2018  . Sickle cell trait ([place]) 06/17/2018  . Previous cesarean delivery, antepartum 09/19/2014  . History of sickle cell trait 05/16/2014  . Asthma 06/15/2013  . 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 CESAREAN SECTION    . CESAREAN SECTION MULTI-GESTATIONAL N/A 09/19/2014   Procedure: CESAREAN SECTION MULTI-GESTATIONAL;  Surgeon: Shelly Bombard, MD;  Location: [facility];  Service: Obstetrics;  Laterality: N/A;  . DILATION AND CURETTAGE OF UTERUS    .  DILATION AND EVACUATION N/A 11/04/2013   Procedure: DILATATION AND EVACUATION;  Surgeon: Alwyn Pea, MD;  Location: [facility];  Service: Gynecology;  Laterality: N/A;  . MOUTH SURGERY     wisdom teeth ext  . VAGINAL DELIVERY     x 4  . WISDOM TOOTH EXTRACTION  2012     OB History    Gravida  7   Para  6   Term  4   Preterm  2   AB  1   Living  7     SAB  1   TAB  0   Ectopic  0   Multiple  1   Live Births  7           Family History  Problem Relation Age of Onset  . Hypertension Mother   . Asthma Mother   . Heart disease Mother   . Hyperlipidemia Mother   . Thyroid disease Mother   . Alcohol abuse Father   . Cancer Maternal Grandmother 37       COLON  . Depression Maternal Grandmother   . Alcohol abuse Maternal Grandmother   . Drug abuse Maternal Grandmother   . Alcohol abuse Maternal Grandfather   . Drug abuse  Maternal Grandfather   . Stroke Maternal Grandfather   . Alcohol abuse Paternal Grandfather   . Drug abuse Paternal Grandfather   . Asthma Sister   . Depression Sister   . Diabetes Maternal Aunt   . HIV Maternal Aunt   . Asthma Sister     Social History   Tobacco Use  . Smoking status: Never Smoker  . Smokeless tobacco: Never Used  Substance Use Topics  . Alcohol use: No    Alcohol/week: 0.0 standard drinks  . Drug use: No    Home Medications Prior to Admission medications   Medication Sig Start Date End Date Taking? Authorizing Provider  acetaminophen (TYLENOL) 500 MG tablet Take 1,000 mg by mouth every 6 (six) hours as needed for moderate pain or headache.     [provider]    Allergies    Patient has no known allergies.  Review of Systems   Review of Systems  Constitutional: Negative for activity change.  Musculoskeletal: Positive for arthralgias and myalgias. Negative for back pain, joint swelling and neck pain.  Skin: Positive for wound.  Neurological: Negative for weakness and numbness.    Physical  Exam Updated Vital Signs BP 123/79 (BP Location: Left Arm)   Pulse (!) 54   Temp 98.1 F (36.7 C) (Oral)   Resp 16   LMP 08/30/2019   SpO2 100%   Physical Exam Vitals and nursing note reviewed.  Constitutional:      Appearance: She is well-developed.  HENT:     Head: Normocephalic and atraumatic.  Eyes:     Pupils: Pupils are equal, round, and reactive to light.  Cardiovascular:     Pulses: Normal pulses. No decreased pulses.  Musculoskeletal:        General: Tenderness present.     Cervical back: Normal range of motion and neck supple.     Left foot: Swelling and tenderness present.     Comments: Patient with swelling and tenderness of the left fourth toe.  Mild erythema noted.  There is palpable fluctuance at the eponychial fold of the toe suggestive of infection.  Patient is very tender with movement.  Skin:    General: Skin is warm and dry.  Neurological:     Mental Status: She is alert.     Sensory: No sensory deficit.     Comments: Motor, sensation, and vascular distal to the injury is fully intact.      ED Results / Procedures / Treatments   Labs (all labs ordered are listed, but only abnormal results are displayed) Labs Reviewed - No data to display  EKG None  Radiology No results found.  Procedures .Marland KitchenIncision and Drainage  Date/Time: 09/25/2019 5:36 PM Performed by: Carlisle Cater, PA-C Authorized by: Carlisle Cater, PA-C   Consent:    Consent obtained:  Verbal   Consent given by:  Patient   Risks discussed:  Pain, infection, incomplete drainage and bleeding   Alternatives discussed:  No treatment Location:    Type:  Abscess   Size:  1cm   Location:  Lower extremity   Lower extremity location:  Toe   Toe location:  L fourth toe Pre-procedure details:    Skin preparation:  Chloraprep (alcohol swab) Anesthesia (see MAR for exact dosages):    Anesthesia method:  Nerve block   Block location:  Left 4th toe   Block needle gauge:  27 G   Block  anesthetic:  Lidocaine 1% w/o epi   Block technique:  3-sided  ring block   Block injection procedure:  Anatomic landmarks identified, introduced needle, incremental injection, anatomic landmarks palpated and negative aspiration for blood   Block outcome:  Anesthesia achieved Procedure type:    Complexity:  Simple Procedure details:    Needle aspiration: no     Incision types:  Stab incision   Scalpel blade:  11   Drainage:  Purulent and serous   Drainage amount:  Moderate   Wound treatment:  Wound left open   Packing materials:  None Post-procedure details:    Patient tolerance of procedure:  Tolerated well, no immediate complications   (including critical care time)  Medications Ordered in ED Medications  lidocaine (PF) (XYLOCAINE) 1 % injection 5 mL (has no administration in time range)    ED Course  I have reviewed the triage vital signs and the nursing notes.  Pertinent labs & imaging results that were available during my care of the patient were reviewed by me and considered in my medical decision making (see chart for details).  Patient seen and examined.  Exam is suggestive of a nailbed infection.  Given trauma yesterday, will obtain x-ray.  Discussed procedure with patient and she agrees to proceed.  Vital signs reviewed and are as follows: BP 123/79 (BP Location: Left Arm)   Pulse (!) 54   Temp 98.1 F (36.7 C) (Oral)   Resp 16   LMP 08/30/2019   SpO2 100%   Patient tolerated procedure well without any complications.  She will be discharged home.  Encouraged elevation of the foot and warm soaks over the next day or so.  Encouraged recheck in 48 to 72 hours if not improving.  Encouraged use of NSAIDs and Tylenol for pain.  Crutches provided for comfort.    MDM Rules/Calculators/A&P                      Patient with trauma to the toe as well as clinical paronychia on exam.  X-rays negative for fracture.  Patient had a paronychia which was opened and drained well  in the emergency department.  No indications for antibiotics at this time.  Do not suspect felon.   Final Clinical Impression(s) / ED Diagnoses Final diagnoses:  Paronychia of fourth toe, left    Rx / DC Orders ED Discharge Orders    None       Carlisle Cater, PA-C 09/25/19 1738    Hayden Rasmussen, MD 09/26/19 1134

## 2020-09-08 DIAGNOSIS — U071 COVID-19: Secondary | ICD-10-CM

## 2020-09-08 HISTORY — DX: COVID-19: U07.1

## 2020-11-06 DIAGNOSIS — F331 Major depressive disorder, recurrent, moderate: Secondary | ICD-10-CM | POA: Insufficient documentation

## 2020-11-06 DIAGNOSIS — F411 Generalized anxiety disorder: Secondary | ICD-10-CM | POA: Insufficient documentation

## 2020-12-18 ENCOUNTER — Other Ambulatory Visit: Payer: Self-pay

## 2020-12-18 ENCOUNTER — Encounter: Payer: Self-pay | Admitting: Obstetrics

## 2020-12-18 ENCOUNTER — Ambulatory Visit (INDEPENDENT_AMBULATORY_CARE_PROVIDER_SITE_OTHER): Payer: Medicaid Other | Admitting: Obstetrics

## 2020-12-18 VITALS — BP 114/75 | HR 52 | Ht 62.0 in | Wt 193.0 lb

## 2020-12-18 DIAGNOSIS — Z30011 Encounter for initial prescription of contraceptive pills: Secondary | ICD-10-CM

## 2020-12-18 DIAGNOSIS — Z3046 Encounter for surveillance of implantable subdermal contraceptive: Secondary | ICD-10-CM

## 2020-12-18 DIAGNOSIS — Z3009 Encounter for other general counseling and advice on contraception: Secondary | ICD-10-CM

## 2020-12-18 DIAGNOSIS — J301 Allergic rhinitis due to pollen: Secondary | ICD-10-CM

## 2020-12-18 DIAGNOSIS — N946 Dysmenorrhea, unspecified: Secondary | ICD-10-CM

## 2020-12-18 MED ORDER — IBUPROFEN 800 MG PO TABS: 800.0000 mg | ORAL_TABLET | Freq: Three times a day (TID) | ORAL | 5 refills | Status: DC | PRN

## 2020-12-18 MED ORDER — NORETHIN ACE-ETH ESTRAD-FE 1-20 MG-MCG(24) PO TABS: 1.0000 | ORAL_TABLET | Freq: Every day | ORAL | 11 refills | Status: DC

## 2020-12-18 MED ORDER — LORATADINE 10 MG PO TABS: 10.0000 mg | ORAL_TABLET | Freq: Every day | ORAL | 11 refills | Status: DC

## 2020-12-18 NOTE — Progress Notes (Signed)
[facility] REMOVAL NOTE  Date of LMP:   unknown  Contraception used: *Nexplanon   Indications:  The patient desires removal of Nexplanon because of AUB.  She understands risks, benefits, and alternatives to Implanon and would like to proceed.  Anesthesia:   Lidocaine 1% plain.  Procedure:  A time-out was performed confirming the procedure and the patient's allergy status.  Complications: None                      The rod was palpated and the area was sterilely prepped.  The area beneath the distal tip was anesthetized with 1% xylocaine and the skin incised                       Over the tip and the tip was exposed, grasped with forcep and removed intact.  A single suture of 4-0 Vicryl was used to close incision.  Steri strip                       And a bandage applied and the arm was wrapped with gauze bandage.  The patient tolerated well.  Instructions:  The patient was instructed to remove the dressing in 24 hours and that some bruising is to be expected.  She was advised to use over the counter analgesics as needed for any pain at the site.  She is to keep the area dry for 24 hours and to call if her hand or arm becomes cold, numb, or blue.  Return visit:  Return in 2 weeks   Shelly Bombard, MD 12/18/2020 11:34 AM

## 2020-12-18 NOTE — Progress Notes (Signed)
GYN presents for Nexplanon removal. Last PAP 05/20/2018

## 2020-12-24 ENCOUNTER — Ambulatory Visit (INDEPENDENT_AMBULATORY_CARE_PROVIDER_SITE_OTHER): Payer: Medicaid Other | Admitting: Obstetrics and Gynecology

## 2020-12-24 ENCOUNTER — Other Ambulatory Visit: Payer: Self-pay

## 2020-12-24 DIAGNOSIS — Z4889 Encounter for other specified surgical aftercare: Secondary | ICD-10-CM | POA: Diagnosis not present

## 2020-12-24 NOTE — Progress Notes (Signed)
GYN presents for FU after Nexplanon removal.  Reports no complaints today.

## 2020-12-24 NOTE — Progress Notes (Signed)
  CC: wound check/suture removal Subjective:    Patient ID: Lindsay Ramirez, female    DOB: Feb 15, 1986, 35 y.o.   MRN: [id number]  HPI Pt returns for wound check and suture removal.  Pt had nexplanon device removed 2 weeks ago.  She denies any recent complications.   Review of Systems     Objective:   Physical Exam There were no vitals filed for this visit.  Single stitch noted in the left arm.  Removed in sterile fashion with scissors.Alfredo Bach of incision were well approximated.  No edema, erythema or signs of infection noted.     Assessment & Plan:   1. Encounter for post surgical wound check Post nexplanon removal stitch removed.  F/u prn Pt currently taking OCP without incident.    Griffin Basil, MD Faculty Attending, Center for [facility]

## 2021-06-19 ENCOUNTER — Encounter: Payer: Self-pay | Admitting: Obstetrics and Gynecology

## 2021-06-19 ENCOUNTER — Telehealth (INDEPENDENT_AMBULATORY_CARE_PROVIDER_SITE_OTHER): Payer: Medicaid Other | Admitting: Obstetrics and Gynecology

## 2021-06-19 DIAGNOSIS — Z3169 Encounter for other general counseling and advice on procreation: Secondary | ICD-10-CM

## 2021-06-19 NOTE — Progress Notes (Signed)
GYNECOLOGY VIRTUAL VISIT ENCOUNTER NOTE  Provider location: Center for [place] at [facility]   Patient location: Home  I connected with [name] on 06/19/21 at  4:10 PM EDT by MyChart Video Encounter and verified that I am speaking with the correct person using two identifiers.   I discussed the limitations, risks, security and privacy concerns of performing an evaluation and management service virtually and the availability of in person appointments. I also discussed with the patient that there may be a patient responsible charge related to this service. The patient expressed understanding and agreed to proceed.   History:  Lindsay Ramirez is a 35 y.o. [id number] female being evaluated today for preconceptional counseling. She denies any abnormal vaginal discharge, bleeding, pelvic pain or other concerns.   Pt notes she and her spouse have been trying since 12/2020 when she had her nexplanon removed.  Pt currently has regular menses lasting 5-7 days.  Current spouse is same FOB as her other 7 children.  Spouse is 61 years old.  Pt has had 5 SVD and 1 cesarean section for twins.   No hx of STD.  Discussed need for semen analysis to rule out female factor.  Hormonal embalance is unlikely, but can still check day 3 FSH and day 21 progesterone.   Past Medical History:  Diagnosis Date   Abnormal Pap smear    LAST PAP 06/2011   Anemia    CHRONIC   Asthma    rarely uses inhaler,  [facility]   Bipolar affective disorder ([place])    Bipolar affective disorder ([place]) 2006   TOOK MEDS X 3 YR   Depression    PP 2013   Gout    feet - no meds   Headache(784.0)    otc med prn   Infection    Hx -UTI   Mental disorder    Missed ab 10/2013   Sickle cell trait ([facility])    SVD (spontaneous vaginal delivery)    x 4   Vaginal Pap smear, abnormal    Past Surgical History:  Procedure Laterality Date   CESAREAN SECTION     CESAREAN SECTION MULTI-GESTATIONAL N/A 09/19/2014   Procedure:  CESAREAN SECTION MULTI-GESTATIONAL;  Surgeon: Shelly Bombard, MD;  Location: [facility];  Service: Obstetrics;  Laterality: N/A;   DILATION AND CURETTAGE OF UTERUS     DILATION AND EVACUATION N/A 11/04/2013   Procedure: DILATATION AND EVACUATION;  Surgeon: Alwyn Pea, MD;  Location: [facility];  Service: Gynecology;  Laterality: N/A;   MOUTH SURGERY     wisdom teeth ext   VAGINAL DELIVERY     x 4   WISDOM TOOTH EXTRACTION  2012   The following portions of the patient's history were reviewed and updated as appropriate: allergies, current medications, past family history, past medical history, past social history, past surgical history and problem list.   Health Maintenance:  Normal pap and negative HRHPV on 05/20/2018.    Review of Systems:  Pertinent items noted in HPI and remainder of comprehensive ROS otherwise negative.  Physical Exam:   General:  Alert, oriented and cooperative. Patient appears to be in no acute distress.  Mental Status: Normal mood and affect. Normal behavior. Normal judgment and thought content.   Respiratory: Normal respiratory effort, no problems with respiration noted  Rest of physical exam deferred due to type of encounter  Labs and Imaging No results found for this or any previous visit (from the past  336 hour(s)). No results found.     Assessment and Plan:     Encounter for conception counseling Pt will call on day 1 of menses to schedule day 3 FSH and day 21 progesterone. She is advised to have intercourse every other day for a week starting around day 14 of her cycle. If labs and female factor are normal may need to consider HSG or trial of clomid.      I discussed the assessment and treatment plan with the patient. The patient was provided an opportunity to ask questions and all were answered. The patient agreed with the plan and demonstrated an understanding of the instructions.   The patient was advised to call back or seek an in-person evaluation/go  to the ED if the symptoms worsen or if the condition fails to improve as anticipated.  I provided 10 minutes of face-to-face time during this encounter.   Griffin Basil, MD Center for [employer], [place]

## 2021-06-19 NOTE — Progress Notes (Signed)
S/w pt for virtual visit. Pt reports that she has been actively trying to get pregnant since her nexplanon was removed 12/18/20 with no success. LMP 05-28-21

## 2021-06-27 ENCOUNTER — Other Ambulatory Visit: Payer: Self-pay

## 2021-06-27 ENCOUNTER — Other Ambulatory Visit: Payer: Medicaid Other

## 2021-06-27 DIAGNOSIS — Z3169 Encounter for other general counseling and advice on procreation: Secondary | ICD-10-CM

## 2021-06-28 LAB — FOLLICLE STIMULATING HORMONE: FSH: 8.3 m[IU]/mL

## 2021-07-15 ENCOUNTER — Other Ambulatory Visit: Payer: Medicaid Other

## 2021-07-15 ENCOUNTER — Other Ambulatory Visit: Payer: Self-pay

## 2021-07-15 DIAGNOSIS — Z3169 Encounter for other general counseling and advice on procreation: Secondary | ICD-10-CM

## 2021-07-16 LAB — PROGESTERONE: Progesterone: 5.9 ng/mL

## 2021-07-18 ENCOUNTER — Ambulatory Visit (INDEPENDENT_AMBULATORY_CARE_PROVIDER_SITE_OTHER): Payer: Medicaid Other

## 2021-07-18 ENCOUNTER — Encounter (HOSPITAL_COMMUNITY): Payer: Self-pay | Admitting: Family Medicine

## 2021-07-18 ENCOUNTER — Other Ambulatory Visit: Payer: Self-pay

## 2021-07-18 ENCOUNTER — Inpatient Hospital Stay (HOSPITAL_COMMUNITY): Payer: Medicaid Other

## 2021-07-18 ENCOUNTER — Encounter: Payer: Self-pay | Admitting: Obstetrics

## 2021-07-18 ENCOUNTER — Inpatient Hospital Stay (HOSPITAL_COMMUNITY)
Admission: AD | Admit: 2021-07-18 | Discharge: 2021-07-18 | Disposition: A | Discharge status: Home / Self Care | Payer: Medicaid Other | Attending: Family Medicine | Admitting: Family Medicine

## 2021-07-18 ENCOUNTER — Telehealth: Payer: Self-pay | Admitting: *Deleted

## 2021-07-18 ENCOUNTER — Other Ambulatory Visit: Payer: Medicaid Other

## 2021-07-18 VITALS — BP 134/76 | HR 96 | Ht 62.0 in | Wt 184.0 lb

## 2021-07-18 DIAGNOSIS — R1030 Lower abdominal pain, unspecified: Secondary | ICD-10-CM | POA: Insufficient documentation

## 2021-07-18 DIAGNOSIS — Z3A01 Less than 8 weeks gestation of pregnancy: Secondary | ICD-10-CM | POA: Diagnosis not present

## 2021-07-18 DIAGNOSIS — O209 Hemorrhage in early pregnancy, unspecified: Secondary | ICD-10-CM | POA: Diagnosis present

## 2021-07-18 DIAGNOSIS — O3680X0 Pregnancy with inconclusive fetal viability, not applicable or unspecified: Secondary | ICD-10-CM | POA: Diagnosis not present

## 2021-07-18 DIAGNOSIS — N912 Amenorrhea, unspecified: Secondary | ICD-10-CM | POA: Diagnosis not present

## 2021-07-18 DIAGNOSIS — J45909 Unspecified asthma, uncomplicated: Secondary | ICD-10-CM

## 2021-07-18 LAB — URINALYSIS, ROUTINE W REFLEX MICROSCOPIC
Bacteria, UA: NONE SEEN
Bilirubin Urine: NEGATIVE
Glucose, UA: NEGATIVE mg/dL
Ketones, ur: NEGATIVE mg/dL
Leukocytes,Ua: NEGATIVE
Nitrite: NEGATIVE
Protein, ur: NEGATIVE mg/dL
Specific Gravity, Urine: 1.011 (ref 1.005–1.030)
pH: 7 (ref 5.0–8.0)

## 2021-07-18 LAB — CBC
HCT: 36.7 % (ref 36.0–46.0)
Hemoglobin: 11.9 g/dL — ABNORMAL LOW (ref 12.0–15.0)
MCH: 27.4 pg (ref 26.0–34.0)
MCHC: 32.4 g/dL (ref 30.0–36.0)
MCV: 84.6 fL (ref 80.0–100.0)
Platelets: 259 10*3/uL (ref 150–400)
RBC: 4.34 MIL/uL (ref 3.87–5.11)
RDW: 12.8 % (ref 11.5–15.5)
WBC: 9.6 10*3/uL (ref 4.0–10.5)
nRBC: 0 % (ref 0.0–0.2)

## 2021-07-18 LAB — WET PREP, GENITAL
Sperm: NONE SEEN
Trich, Wet Prep: NONE SEEN
WBC, Wet Prep HPF POC: NONE SEEN
Yeast Wet Prep HPF POC: NONE SEEN

## 2021-07-18 LAB — HCG, QUANTITATIVE, PREGNANCY: hCG, Beta Chain, Quant, S: 75 m[IU]/mL — ABNORMAL HIGH (ref <5)

## 2021-07-18 LAB — POCT URINE PREGNANCY: Preg Test, Ur: POSITIVE — AB

## 2021-07-18 MED ORDER — ALBUTEROL SULFATE HFA 108 (90 BASE) MCG/ACT IN AERS: 2.0000 | INHALATION_SPRAY | Freq: Four times a day (QID) | RESPIRATORY_TRACT | 0 refills | Status: DC | PRN

## 2021-07-18 NOTE — Progress Notes (Signed)
[name] presents today for UPT. She has no unusual complaints.  LMP: 06/25/21    OBJECTIVE: Appears well, in no apparent distress.  OB History     Gravida  8   Para  6   Term  4   Preterm  2   AB  1   Living  7      SAB  1   IAB  0   Ectopic  0   Multiple  1   Live Births  7          Home UPT Result: 07/17/21 Positive In-Office UPT result: 07/18/21 I have reviewed the patient's medical, obstetrical, social, and family histories, and medications.   ASSESSMENT: Positive pregnancy test, Quant to confirm  PLAN Prenatal care to be completed at: [facility].

## 2021-07-18 NOTE — MAU Provider Note (Addendum)
History     CSN: [id number]  Arrival date and time: 07/18/21 1807   Event Date/Time   First Provider Initiated Contact with Patient 07/18/21 1906      Chief Complaint  Patient presents with   Vaginal Bleeding   HPI Lindsay Ramirez is a 35 y.o. [id number] at [redacted]w[redacted]d who presents with vaginal bleeding. Symptoms started this afternoon. Reports pink spotting only when she wipes. Not saturating pads or passing clots. Had an episode of lower abdominal cramping prior to arrival but denies pain currently.  Denies fever, n/v, dysuria, abnormal vaginal discharge, or vaginal irritation.   OB History     Gravida  8   Para  6   Term  4   Preterm  2   AB  1   Living  7      SAB  1   IAB  0   Ectopic  0   Multiple  1   Live Births  7           Past Medical History:  Diagnosis Date   Anemia    CHRONIC   Asthma    rarely uses inhaler,  [facility]   Bipolar affective disorder ([place])    Depression    PP 2013   Gout    feet - no meds   Headache(784.0)    otc med prn   Sickle cell trait ([place])    Vaginal Pap smear, abnormal     Past Surgical History:  Procedure Laterality Date   CESAREAN SECTION MULTI-GESTATIONAL N/A 09/19/2014   Procedure: CESAREAN SECTION MULTI-GESTATIONAL;  Surgeon: Shelly Bombard, MD;  Location: [facility];  Service: Obstetrics;  Laterality: N/A;   DILATION AND CURETTAGE OF UTERUS     DILATION AND EVACUATION N/A 11/04/2013   Procedure: DILATATION AND EVACUATION;  Surgeon: Alwyn Pea, MD;  Location: [facility];  Service: Gynecology;  Laterality: N/A;   WISDOM TOOTH EXTRACTION  2012    Family History  Problem Relation Age of Onset   Hypertension Mother    Asthma Mother    Heart disease Mother    Hyperlipidemia Mother    Thyroid disease Mother    Alcohol abuse Father    Cancer Maternal Grandmother 42       COLON   Depression Maternal Grandmother    Alcohol abuse Maternal Grandmother    Drug abuse Maternal Grandmother    Alcohol abuse  Maternal Grandfather    Drug abuse Maternal Grandfather    Stroke Maternal Grandfather    Alcohol abuse Paternal Grandfather    Drug abuse Paternal Grandfather    Asthma Sister    Depression Sister    Diabetes Maternal Aunt    HIV Maternal Aunt    Asthma Sister     Social History   Tobacco Use   Smoking status: Never   Smokeless tobacco: Never  Vaping Use   Vaping Use: Never used  Substance Use Topics   Alcohol use: No    Alcohol/week: 0.0 standard drinks   Drug use: No    Allergies: No Known Allergies  Medications Prior to Admission  Medication Sig Dispense Refill Last Dose   albuterol (VENTOLIN HFA) 108 (90 Base) MCG/ACT inhaler Inhale 2 puffs into the lungs every 6 (six) hours as needed for wheezing or shortness of breath. 1 each 0     Review of Systems  Constitutional: Negative.   Gastrointestinal: Negative.   Genitourinary:  Positive for vaginal bleeding. Negative for dysuria and vaginal  discharge.  Physical Exam   Blood pressure 122/71, pulse 70, temperature 98.2 F (36.8 C), resp. rate 18, height 5\' 2"  (1.575 m), weight 84.4 kg, last menstrual period 06/25/2021.  Physical Exam Vitals and nursing note reviewed.  Constitutional:      Appearance: Normal appearance. She is not ill-appearing.  HENT:     Head: Normocephalic and atraumatic.  Eyes:     General: No scleral icterus. Pulmonary:     Effort: Pulmonary effort is normal. No respiratory distress.  Abdominal:     General: Abdomen is flat.     Palpations: Abdomen is soft.     Tenderness: There is no abdominal tenderness. There is no rebound.  Genitourinary:    Comments: NEFG No blood on pad Skin:    General: Skin is warm and dry.  Neurological:     General: No focal deficit present.     Mental Status: She is alert.  Psychiatric:        Mood and Affect: Mood normal.        Behavior: Behavior normal.    MAU Course  Procedures Results for orders placed or performed during the hospital encounter  of 07/18/21 (from the past 24 hour(s))  Wet prep, genital     Status: Abnormal   Collection Time: 07/18/21  7:15 PM  Result Value Ref Range   Yeast Wet Prep HPF POC NONE SEEN NONE SEEN   Trich, Wet Prep NONE SEEN NONE SEEN   Clue Cells Wet Prep HPF POC PRESENT (A) NONE SEEN   WBC, Wet Prep HPF POC NONE SEEN NONE SEEN   Sperm NONE SEEN    US OB LESS THAN 14 WEEKS WITH OB TRANSVAGINAL  Result Date: 07/18/2021 CLINICAL DATA:  Initial evaluation for vaginal bleeding, cramping, early pregnancy. EXAM: OBSTETRIC <14 WK [place] AND TRANSVAGINAL OB US TECHNIQUE: Both transabdominal and transvaginal ultrasound examinations were performed for complete evaluation of the gestation as well as the maternal uterus, adnexal regions, and pelvic cul-de-sac. Transvaginal technique was performed to assess early pregnancy. COMPARISON:  None available. FINDINGS: Intrauterine gestational sac: Negative. Yolk sac:  Negative. Embryo:  Negative. Cardiac Activity: Negative. Heart Rate: N/A Subchorionic hemorrhage:  None visualized. Maternal uterus/adnexae: Ovaries within normal limits. Small corpus luteal cyst noted on the right. No adnexal mass or free fluid. IMPRESSION: 1. Early pregnancy with no discrete IUP or adnexal mass identified. Finding is consistent with a pregnancy of unknown anatomic location. Differential considerations include IUP to early to visualize, recent SAB, or possibly occult ectopic pregnancy. Close clinical monitoring with serial beta HCGs and close interval follow-up ultrasound recommended as clinically warranted. 2. No other acute maternal uterine or adnexal abnormality. Electronically Signed   By: Jeannine Boga M.D.   On: 07/18/2021 19:44    MDM +UPT UA, wet prep, GC/chlamydia, CBC, ABO/Rh, quant hCG, and [place] today to rule out ectopic pregnancy which can be life threatening.   Reports scant pink spotting when she wipes. RH positive  Labs pending Care turned over to Hansel Feinstein CNM Jorje Guild, NP 07/18/2021 8:19 PM   Results for orders placed or performed during the hospital encounter of 07/18/21 (from the past 24 hour(s))  Wet prep, genital     Status: Abnormal   Collection Time: 07/18/21  7:15 PM  Result Value Ref Range   Yeast Wet Prep HPF POC NONE SEEN NONE SEEN   Trich, Wet Prep NONE SEEN NONE SEEN   Clue Cells Wet Prep HPF POC PRESENT (A) NONE SEEN  WBC, Wet Prep HPF POC NONE SEEN NONE SEEN   Sperm NONE SEEN   Urinalysis, Routine w reflex microscopic     Status: Abnormal   Collection Time: 07/18/21  7:24 PM  Result Value Ref Range   Color, Urine YELLOW YELLOW   APPearance CLEAR CLEAR   Specific Gravity, Urine 1.011 1.005 - 1.030   pH 7.0 5.0 - 8.0   Glucose, UA NEGATIVE NEGATIVE mg/dL   Hgb urine dipstick MODERATE (A) NEGATIVE   Bilirubin Urine NEGATIVE NEGATIVE   Ketones, ur NEGATIVE NEGATIVE mg/dL   Protein, ur NEGATIVE NEGATIVE mg/dL   Nitrite NEGATIVE NEGATIVE   Leukocytes,Ua NEGATIVE NEGATIVE   RBC / HPF 0-5 0 - 5 RBC/hpf   WBC, UA 0-5 0 - 5 WBC/hpf   Bacteria, UA NONE SEEN NONE SEEN   Squamous Epithelial / LPF 0-5 0 - 5  CBC     Status: Abnormal   Collection Time: 07/18/21  7:44 PM  Result Value Ref Range   WBC 9.6 4.0 - 10.5 K/uL   RBC 4.34 3.87 - 5.11 MIL/uL   Hemoglobin 11.9 (L) 12.0 - 15.0 g/dL   HCT 36.7 36.0 - 46.0 %   MCV 84.6 80.0 - 100.0 fL   MCH 27.4 26.0 - 34.0 pg   MCHC 32.4 30.0 - 36.0 g/dL   RDW 12.8 11.5 - 15.5 %   Platelets 259 150 - 400 K/uL   nRBC 0.0 0.0 - 0.2 %  hCG, quantitative, pregnancy     Status: Abnormal   Collection Time: 07/18/21  7:44 PM  Result Value Ref Range   hCG, Beta Chain, Quant, S 75 (H) <5 mIU/mL   Discussed possibility of normal early pregnancy with bleeding, SAB or ectopic.  Recommend followup HCG in 48 hours Assessment and Plan  A:   Pregnancy at [redacted]w[redacted]d        Bleeding in early pregnancy        Pregnancy of unknown location  P:   Discharge home        Ectopic precautions        Return in 48  hrs for repeat HCG level      Encouraged to return if she develops worsening of symptoms, increase in pain, fever, or other concerning symptoms.   Seabron Spates, CNM

## 2021-07-18 NOTE — Telephone Encounter (Signed)
TC from patient reporting small amount of vaginal blood with wiping. Repots mild cramping. Patient is [redacted]w[redacted]d by LMP. Advised small amount of bleeding can be normal in early pregnancy. Advised patient to seek care in MAU if bleeding continues or if abdominal pain worsens. Patient verbalized understanding.

## 2021-07-18 NOTE — MAU Note (Signed)
Pt reports she has some vag bleeding when wiping and some mild cramping that stared today. Had positive pregnancy test at doctors office

## 2021-07-19 LAB — GC/CHLAMYDIA PROBE AMP (~~LOC~~) NOT AT ARMC
Chlamydia: NEGATIVE
Comment: NEGATIVE
Comment: NORMAL
Neisseria Gonorrhea: NEGATIVE

## 2021-07-19 LAB — BETA HCG QUANT (REF LAB): hCG Quant: 51 m[IU]/mL

## 2021-07-19 NOTE — Progress Notes (Signed)
TC to patient to confirm plan for repeat bHCG in MAU 07/20/21. Patient confirmed plan to return to MAU 07/20/21.

## 2021-07-20 ENCOUNTER — Other Ambulatory Visit: Payer: Self-pay

## 2021-07-20 ENCOUNTER — Inpatient Hospital Stay (HOSPITAL_COMMUNITY)
Admission: AD | Admit: 2021-07-20 | Discharge: 2021-07-20 | Disposition: A | Discharge status: Home / Self Care | Payer: Medicaid Other | Attending: Obstetrics and Gynecology | Admitting: Obstetrics and Gynecology

## 2021-07-20 DIAGNOSIS — Z3A01 Less than 8 weeks gestation of pregnancy: Secondary | ICD-10-CM | POA: Insufficient documentation

## 2021-07-20 DIAGNOSIS — O3680X0 Pregnancy with inconclusive fetal viability, not applicable or unspecified: Secondary | ICD-10-CM | POA: Insufficient documentation

## 2021-07-20 LAB — HCG, QUANTITATIVE, PREGNANCY: hCG, Beta Chain, Quant, S: 210 m[IU]/mL — ABNORMAL HIGH (ref <5)

## 2021-07-20 NOTE — MAU Provider Note (Signed)
Subjective:  Lindsay Ramirez is a 35 y.o. [id number] at [redacted]w[redacted]d who presents today for FU BHCG. She was seen on 07/18/2274. Results from that day show no IUP on [place], and HCG 75. She denies vaginal bleeding. She denies abdominal or pelvic pain.  Objective:  Physical Exam  Nursing note and vitals reviewed. Constitutional: She is oriented to person, place, and time. She appears well-developed and well-nourished. No distress.  HENT:  Head: Normocephalic.  Cardiovascular: Normal rate.  Respiratory: Effort normal.  GI: Soft. There is no tenderness.  Neurological: She is alert and oriented to person, place, and time. Skin: Skin is warm and dry.  Psychiatric: She has a normal mood and affect.   Results for orders placed or performed during the hospital encounter of 07/20/21 (from the past 24 hour(s))  hCG, quantitative, pregnancy     Status: Abnormal   Collection Time: 07/20/21 12:32 PM  Result Value Ref Range   hCG, Beta Chain, Quant, S 210 (H) <5 mIU/mL    Assessment/Plan: Pregnancy of unknown location HCG did  rise appropriately FU in 1 weeks for: repeat US Strict return precautions Pelvic rest  Noni Saupe I, NP 07/20/2021 2:51 PM

## 2021-07-20 NOTE — MAU Note (Signed)
Lindsay Ramirez is a 35 y.o. at [redacted]w[redacted]d here in MAU reporting: here for follow up hcg. Denies both pain and bleeding.  Onset of complaint: ongoing  Pain score: 0/10  Vitals:   07/20/21 1225  BP: (!) 104/54  Pulse: 65  Resp: 16  Temp: 98.2 F (36.8 C)  SpO2: 100%     Lab orders placed from triage: hcg

## 2021-07-24 ENCOUNTER — Other Ambulatory Visit: Payer: Self-pay | Admitting: Medical

## 2021-07-24 DIAGNOSIS — O3680X0 Pregnancy with inconclusive fetal viability, not applicable or unspecified: Secondary | ICD-10-CM

## 2021-07-29 ENCOUNTER — Other Ambulatory Visit: Payer: Self-pay

## 2021-07-29 ENCOUNTER — Ambulatory Visit
Admission: RE | Admit: 2021-07-29 | Discharge: 2021-07-29 | Disposition: A | Discharge status: Home / Self Care | Payer: Medicaid Other | Source: Ambulatory Visit | Attending: Medical | Admitting: Medical

## 2021-07-29 DIAGNOSIS — O3680X0 Pregnancy with inconclusive fetal viability, not applicable or unspecified: Secondary | ICD-10-CM

## 2021-08-15 ENCOUNTER — Telehealth: Payer: Self-pay | Admitting: Obstetrics and Gynecology

## 2021-08-15 MED ORDER — DOXYLAMINE-PYRIDOXINE 10-10 MG PO TBEC: DELAYED_RELEASE_TABLET | ORAL | 2 refills | Status: DC

## 2021-08-15 NOTE — Telephone Encounter (Signed)
Patient called requesting something to help with her nausea.  Rx for Diclegis routed to pharmacy per protocol.

## 2021-08-21 ENCOUNTER — Ambulatory Visit: Payer: Medicaid Other | Admitting: *Deleted

## 2021-08-21 ENCOUNTER — Other Ambulatory Visit: Payer: Self-pay

## 2021-08-21 DIAGNOSIS — F419 Anxiety disorder, unspecified: Secondary | ICD-10-CM

## 2021-08-21 DIAGNOSIS — Z3480 Encounter for supervision of other normal pregnancy, unspecified trimester: Secondary | ICD-10-CM | POA: Insufficient documentation

## 2021-08-21 MED ORDER — BLOOD PRESSURE MONITOR KIT: 1.0000 | PACK | Freq: Once | 0 refills | Status: AC

## 2021-08-21 NOTE — Progress Notes (Signed)
New OB Intake  I connected with  [place] on 08/21/21 at  2:00 PM EST by telephone Video Visit and verified that I am speaking with the correct person using two identifiers. Nurse is located at [facility] and pt is located at home.  I discussed the limitations, risks, security and privacy concerns of performing an evaluation and management service by telephone and the availability of in person appointments. I also discussed with the patient that there may be a patient responsible charge related to this service. The patient expressed understanding and agreed to proceed.  I explained I am completing New OB Intake today. We discussed her EDD of 04/01/22 that is based on LMP of 06/25/21. Pt is G8/P7. I reviewed her allergies, medications, Medical/Surgical/OB history, and appropriate screenings. I informed her of [facility] services. Based on history, this is a/an  pregnancy uncomplicated .   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   MyChart/Babyscripts MyChart access verified. I explained pt will have some visits in office and some virtually. Babyscripts instructions given and order placed. Patient verifies receipt of registration text/e-mail. Account successfully created and app downloaded.  Blood Pressure Cuff  Blood pressure cuff ordered for patient to pick-up from [employer]. Explained after first prenatal appt pt will check weekly and document in 46.  Weight scale: Patient  does not have weight  scale. Weight scale ordered for patient to pick up form [facility].   Anatomy US Explained first scheduled Korea will be around 19 weeks.  Labs Discussed Johnsie Cancel genetic screening with patient. Would like both Panorama and Horizon drawn at new OB visit. Routine prenatal labs needed.  Covid Vaccine Patient has not covid vaccine.    Informed patient of [facility] website  and placed link in her AVS.   Social Determinants of Health Food Insecurity: Patient denies food insecurity. WIC Referral: Patient is not interested in referral to [facility].  Transportation: Patient denies transportation needs. Childcare: Discussed no children allowed at ultrasound appointments. Offered childcare services; patient declines childcare services at this time.  Send link to Pregnancy Navigators   Placed OB Box on problem list and updated  First visit review I reviewed new OB appt with pt. I explained she will have a pelvic exam, ob bloodwork with genetic screening, and PAP smear. Explained pt will be seen by Tomi Bamberger, NP at first visit; encounter routed to appropriate provider. Explained that patient will be seen by pregnancy navigator following visit with provider. [facility] information placed in AVS.   Lorin Mercy, [place] 08/21/2021  2:04 PM

## 2021-08-24 ENCOUNTER — Encounter (HOSPITAL_COMMUNITY): Payer: Self-pay | Admitting: Obstetrics and Gynecology

## 2021-08-24 ENCOUNTER — Other Ambulatory Visit: Payer: Self-pay

## 2021-08-24 ENCOUNTER — Inpatient Hospital Stay (HOSPITAL_COMMUNITY): Payer: Medicaid Other

## 2021-08-24 ENCOUNTER — Inpatient Hospital Stay (HOSPITAL_COMMUNITY)
Admission: AD | Admit: 2021-08-24 | Discharge: 2021-08-24 | Disposition: A | Discharge status: Home / Self Care | Payer: Medicaid Other | Attending: Obstetrics and Gynecology | Admitting: Obstetrics and Gynecology

## 2021-08-24 DIAGNOSIS — Z6790 Unspecified blood type, Rh positive: Secondary | ICD-10-CM | POA: Insufficient documentation

## 2021-08-24 DIAGNOSIS — N930 Postcoital and contact bleeding: Secondary | ICD-10-CM | POA: Diagnosis not present

## 2021-08-24 DIAGNOSIS — Z3A08 8 weeks gestation of pregnancy: Secondary | ICD-10-CM | POA: Diagnosis not present

## 2021-08-24 DIAGNOSIS — O209 Hemorrhage in early pregnancy, unspecified: Secondary | ICD-10-CM | POA: Diagnosis present

## 2021-08-24 LAB — CBC
HCT: 33.4 % — ABNORMAL LOW (ref 36.0–46.0)
Hemoglobin: 11.1 g/dL — ABNORMAL LOW (ref 12.0–15.0)
MCH: 28 pg (ref 26.0–34.0)
MCHC: 33.2 g/dL (ref 30.0–36.0)
MCV: 84.1 fL (ref 80.0–100.0)
Platelets: 271 10*3/uL (ref 150–400)
RBC: 3.97 MIL/uL (ref 3.87–5.11)
RDW: 12.6 % (ref 11.5–15.5)
WBC: 6.2 10*3/uL (ref 4.0–10.5)
nRBC: 0 % (ref 0.0–0.2)

## 2021-08-24 LAB — HCG, QUANTITATIVE, PREGNANCY: hCG, Beta Chain, Quant, S: 13628 m[IU]/mL — ABNORMAL HIGH (ref <5)

## 2021-08-24 NOTE — MAU Provider Note (Addendum)
Chief Complaint: Vaginal Bleeding   Event Date/Time   First Provider Initiated Contact with Patient 08/24/21 1401      SUBJECTIVE HPI: Lindsay Ramirez is a 35 y.o. [id number] at [redacted]w[redacted]d by LMP who presents to maternity admissions reporting onset of vaginal bleeding yesterday, 08/23/21. She reports light red bleeding, like menstrual bleeding, requiring a pad but not soaking pads.  There is no pain. There are no other associated symptoms.    HPI  Past Medical History:  Diagnosis Date   Anemia    CHRONIC   Asthma    rarely uses inhaler,  [facility]   Bipolar affective disorder ([place])    Depression    PP 2013   Gout    feet - no meds   Headache(784.0)    otc med prn   Sickle cell trait ([place])    Vaginal Pap smear, abnormal    Past Surgical History:  Procedure Laterality Date   CESAREAN SECTION MULTI-GESTATIONAL N/A 09/19/2014   Procedure: CESAREAN SECTION MULTI-GESTATIONAL;  Surgeon: Shelly Bombard, MD;  Location: [facility];  Service: Obstetrics;  Laterality: N/A;   DILATION AND CURETTAGE OF UTERUS     DILATION AND EVACUATION N/A 11/04/2013   Procedure: DILATATION AND EVACUATION;  Surgeon: Alwyn Pea, MD;  Location: [facility];  Service: Gynecology;  Laterality: N/A;   WISDOM TOOTH EXTRACTION  2012   Social History   Socioeconomic History   Marital status: Single    Spouse name: Not on file   Number of children: 3   Years of education: 13   Highest education level: Not on file  Occupational History   Occupation: STUDENT    Employer: [employer]   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  Tobacco Use   Smoking status: Never   Smokeless tobacco: Never  Vaping Use   Vaping Use: Never used  Substance and Sexual Activity   Alcohol use: No    Alcohol/week: 0.0 standard drinks   Drug use: No   Sexual activity: Yes    Partners: Male    Birth control/protection: None  Other Topics Concern   Not on file  Social History Narrative   Not on file   Social Determinants of Health    Financial Resource Strain: Not on file  Food Insecurity: Not on file  Transportation Needs: Not on file  Physical Activity: Not on file  Stress: Not on file  Social Connections: Not on file  Intimate Partner Violence: Not on file   No current facility-administered medications on file prior to encounter.   Current Outpatient Medications on File Prior to Encounter  Medication Sig Dispense Refill   albuterol (VENTOLIN HFA) 108 (90 Base) MCG/ACT inhaler Inhale 2 puffs into the lungs every 6 (six) hours as needed for wheezing or shortness of breath. 1 each 0   Doxylamine-Pyridoxine (DICLEGIS) 10-10 MG TBEC Take two tablets at bedtime, may add 1 tablet at breakfast and 1 tablet at lunch if needed. 100 tablet 2   No Known Allergies  ROS:  Review of Systems  Constitutional:  Negative for chills, fatigue and fever.  Respiratory:  Negative for shortness of breath.   Cardiovascular:  Negative for chest pain.  Genitourinary:  Positive for vaginal bleeding. Negative for difficulty urinating, dysuria, flank pain, pelvic pain, vaginal discharge and vaginal pain.  Neurological:  Negative for dizziness and headaches.  Psychiatric/Behavioral: Negative.      I have reviewed patient's Past Medical Hx, Surgical Hx, Family Hx, Social Hx, medications and allergies.  Physical Exam  Patient Vitals for the past 24 hrs:  BP Temp Temp src Pulse Resp SpO2 Height Weight  08/24/21 1526 (!) 110/58 -- -- (!) 59 -- -- -- --  08/24/21 1344 (!) 102/53 98.3 F (36.8 C) Oral 69 16 98 % -- --  08/24/21 1337 -- -- -- -- -- -- 5\' 2"  (1.575 m) 86.2 kg   Constitutional: Well-developed, well-nourished female in no acute distress.  Cardiovascular: normal rate Respiratory: normal effort GI: Abd soft, non-tender. Pos BS x 4 MS: Extremities nontender, no edema, normal ROM Neurologic: Alert and oriented x 4.  GU: Neg CVAT.  PELVIC EXAM: Deferred   LAB RESULTS Results for orders placed or performed during the  hospital encounter of 08/24/21 (from the past 24 hour(s))  CBC     Status: Abnormal   Collection Time: 08/24/21  2:03 PM  Result Value Ref Range   WBC 6.2 4.0 - 10.5 K/uL   RBC 3.97 3.87 - 5.11 MIL/uL   Hemoglobin 11.1 (L) 12.0 - 15.0 g/dL   HCT 33.4 (L) 36.0 - 46.0 %   MCV 84.1 80.0 - 100.0 fL   MCH 28.0 26.0 - 34.0 pg   MCHC 33.2 30.0 - 36.0 g/dL   RDW 12.6 11.5 - 15.5 %   Platelets 271 150 - 400 K/uL   nRBC 0.0 0.0 - 0.2 %  hCG, quantitative, pregnancy     Status: Abnormal   Collection Time: 08/24/21  2:03 PM  Result Value Ref Range   hCG, Beta Chain, Quant, S 13,628 (H) <5 mIU/mL       IMAGING US OB Transvaginal  Result Date: 08/24/2021 CLINICAL DATA:  Vaginal bleeding in pregnancy EXAM: TRANSVAGINAL OB ULTRASOUND TECHNIQUE: Transvaginal ultrasound was performed for complete evaluation of the gestation as well as the maternal uterus, adnexal regions, and pelvic cul-de-sac. COMPARISON:  None. FINDINGS: Intrauterine gestational sac: Single Yolk sac:  Visualized. Embryo:  Visualized. Cardiac Activity: Visualized. Heart Rate: 154 bpm CRL:   15.6 mm   7 w 6 d                  [place] EDC: 04/06/22 Subchorionic hemorrhage:  None visualized. Maternal uterus/adnexae: There is a hypoechoic mass in the uterus measuring 1.5 x 1.2 x 1.4 cm, possibly a fibroid. Normal appearance of the bilateral ovaries. No free fluid in the pelvis. IMPRESSION: 1. Single viable intrauterine pregnancy with estimated gestational age [redacted] weeks 6 days. 2.  Small mass in the uterus measuring 1.5 cm, possibly a fibroid. Electronically Signed   By: Audie Pinto M.D.   On: 08/24/2021 14:56    MAU Management/MDM: Orders Placed This Encounter  Procedures   US OB Transvaginal   CBC   hCG, quantitative, pregnancy   Discharge patient    No orders of the defined types were placed in this encounter.   Viable IUP on today's [place], c/w LMP dates.  Likely postcoital cervical bleeding.  D/C home with bleeding precautions. Pt to  start prenatal care as planned.  Outpatient [place] scheduled on 12/22 is not needed and was cancelled today. Pt to return to MAU with worsening symptoms.  ASSESSMENT 1. Vaginal bleeding in pregnancy, first trimester   2. [redacted] weeks gestation of pregnancy   3. Postcoital bleeding   4. Blood type, Rh positive     PLAN Discharge home Allergies as of 08/24/2021   No Known Allergies      Medication List     TAKE these medications  albuterol 108 (90 Base) MCG/ACT inhaler Commonly known as: Ventolin HFA Inhale 2 puffs into the lungs every 6 (six) hours as needed for wheezing or shortness of breath.   Doxylamine-Pyridoxine 10-10 MG Tbec Commonly known as: Diclegis Take two tablets at bedtime, may add 1 tablet at breakfast and 1 tablet at lunch if needed.        Follow-up Information     [facility] Follow up.   Specialty: Obstetrics and Gynecology Why: As needed for emergencies Contact information: [address] [id number] [place] Follow up.   Why: As scheduled for prenatal visit 09/05/21. Contact information: [place] [id number] [place] Certified Nurse-Midwife 08/24/2021  3:27 PM

## 2021-08-24 NOTE — MAU Note (Signed)
Lindsay Ramirez is a 35 y.o. at [redacted]w[redacted]d here in MAU reporting: vaginal bleeding that started yesterday. States it started out light and now is a little heavier. Not wearing a pad but states she needs one. Did have IC yesterday. Had some pain earlier but none at this time.  Onset of complaint: yesterday  Pain score: 0/10  Vitals:   08/24/21 1344  BP: (!) 102/53  Pulse: 69  Resp: 16  Temp: 98.3 F (36.8 C)  SpO2: 98%     Lab orders placed from triage: none

## 2021-08-26 ENCOUNTER — Institutional Professional Consult (permissible substitution): Payer: Medicaid Other | Admitting: Licensed Clinical Social Worker

## 2021-08-27 ENCOUNTER — Ambulatory Visit (INDEPENDENT_AMBULATORY_CARE_PROVIDER_SITE_OTHER): Payer: Medicaid Other | Admitting: Licensed Clinical Social Worker

## 2021-08-27 DIAGNOSIS — F439 Reaction to severe stress, unspecified: Secondary | ICD-10-CM

## 2021-08-27 DIAGNOSIS — F419 Anxiety disorder, unspecified: Secondary | ICD-10-CM

## 2021-08-27 NOTE — BH Specialist Note (Signed)
Integrated Behavioral Health via Telemedicine Visit  08/27/2021 DHANVI BOESEN [id number]  Number of [place] visits: 1 Session Start time: 908am  Session End time: 930am Total time: 32 mins via mychart video   Referring Provider: Dr. Damita Dunnings  Patient/Family location: Home  [facility] Provider location: [place]  All persons participating in visit: Pt S [place] and LCSW A.Myrl Lazarus  Types of Service: Individual psychotherapy video visit   I connected with [place] and/or [place] via  Telephone or Geologist, engineering  (Video is Caregility application) and verified that I am speaking with the correct person using two identifiers. Discussed confidentiality: Yes   I discussed the limitations of telemedicine and the availability of in person appointments.  Discussed there is a possibility of technology failure and discussed alternative modes of communication if that failure occurs.  I discussed that engaging in this telemedicine visit, they consent to the provision of behavioral healthcare and the services will be billed under their insurance.  Patient and/or legal guardian expressed understanding and consented to Telemedicine visit: Yes   Presenting Concerns: Patient and/or family reports the following symptoms/concerns: depressed mood, family conflict, limited support,  Duration of problem: over one year ; Severity of problem: mild  Patient and/or Family's Strengths/Protective Factors: Concrete supports in place (healthy food, safe environments, etc.)  Goals Addressed: Patient will:  Reduce symptoms of: depression and stress   Increase knowledge and/or ability of: coping skills   Demonstrate ability to: Increase adequate support systems for patient/family  Progress towards Goals: Ongoing  Interventions: Interventions utilized:  Supportive Counseling Standardized Assessments completed: PHQ 9  Assessment: Patient  currently experiencing stress and anxiety .   Patient may benefit from integrated behavioral health  Plan: Follow up with behavioral health clinician on : 3 weeks via mychart video Behavioral recommendations: Avoid conflict and stress, prioritize rest, create and sustain boundaries, keep medical appts.  Referral(s): [place] (In Clinic)  I discussed the assessment and treatment plan with the patient and/or parent/guardian. They were provided an opportunity to ask questions and all were answered. They agreed with the plan and demonstrated an understanding of the instructions.   They were advised to call back or seek an in-person evaluation if the symptoms worsen or if the condition fails to improve as anticipated.  Lynnea Ferrier, LCSW

## 2021-08-29 ENCOUNTER — Ambulatory Visit: Payer: Medicaid Other

## 2021-08-30 ENCOUNTER — Inpatient Hospital Stay (HOSPITAL_COMMUNITY)
Admission: AD | Admit: 2021-08-30 | Discharge: 2021-08-30 | Disposition: A | Discharge status: Home / Self Care | Payer: Medicaid Other | Attending: Obstetrics and Gynecology | Admitting: Obstetrics and Gynecology

## 2021-08-30 ENCOUNTER — Other Ambulatory Visit: Payer: Self-pay

## 2021-08-30 ENCOUNTER — Encounter (HOSPITAL_COMMUNITY): Payer: Self-pay | Admitting: Obstetrics and Gynecology

## 2021-08-30 ENCOUNTER — Inpatient Hospital Stay (HOSPITAL_COMMUNITY): Payer: Medicaid Other

## 2021-08-30 DIAGNOSIS — O021 Missed abortion: Secondary | ICD-10-CM | POA: Diagnosis not present

## 2021-08-30 DIAGNOSIS — Z3A09 9 weeks gestation of pregnancy: Secondary | ICD-10-CM | POA: Diagnosis not present

## 2021-08-30 DIAGNOSIS — O209 Hemorrhage in early pregnancy, unspecified: Secondary | ICD-10-CM

## 2021-08-30 DIAGNOSIS — Z3480 Encounter for supervision of other normal pregnancy, unspecified trimester: Secondary | ICD-10-CM

## 2021-08-30 LAB — URINALYSIS, ROUTINE W REFLEX MICROSCOPIC
Bilirubin Urine: NEGATIVE
Glucose, UA: NEGATIVE mg/dL
Ketones, ur: NEGATIVE mg/dL
Leukocytes,Ua: NEGATIVE
Nitrite: NEGATIVE
Protein, ur: NEGATIVE mg/dL
Specific Gravity, Urine: 1.01 (ref 1.005–1.030)
pH: 8 (ref 5.0–8.0)

## 2021-08-30 LAB — CBC
HCT: 33.7 % — ABNORMAL LOW (ref 36.0–46.0)
Hemoglobin: 10.9 g/dL — ABNORMAL LOW (ref 12.0–15.0)
MCH: 27.4 pg (ref 26.0–34.0)
MCHC: 32.3 g/dL (ref 30.0–36.0)
MCV: 84.7 fL (ref 80.0–100.0)
Platelets: 265 10*3/uL (ref 150–400)
RBC: 3.98 MIL/uL (ref 3.87–5.11)
RDW: 13 % (ref 11.5–15.5)
WBC: 5.3 10*3/uL (ref 4.0–10.5)
nRBC: 0 % (ref 0.0–0.2)

## 2021-08-30 LAB — URINALYSIS, MICROSCOPIC (REFLEX): RBC / HPF: >50 RBC/hpf (ref 0–5)

## 2021-08-30 MED ORDER — PROMETHAZINE HCL 12.5 MG PO TABS: 12.5000 mg | ORAL_TABLET | Freq: Four times a day (QID) | ORAL | 0 refills | Status: DC | PRN

## 2021-08-30 MED ORDER — HYDROCODONE-ACETAMINOPHEN 5-325 MG PO TABS
2.0000 | ORAL_TABLET | Freq: Once | ORAL | Status: AC
  Administered 2021-08-30: 17:00:00 2 via ORAL
  Filled 2021-08-30: qty 2

## 2021-08-30 MED ORDER — MISOPROSTOL 200 MCG PO TABS
800.0000 ug | ORAL_TABLET | Freq: Once | ORAL | Status: AC
  Administered 2021-08-30: 17:00:00 800 ug via ORAL
  Filled 2021-08-30: qty 4

## 2021-08-30 MED ORDER — PROMETHAZINE HCL 25 MG PO TABS
25.0000 mg | ORAL_TABLET | Freq: Once | ORAL | Status: AC
  Administered 2021-08-30: 17:00:00 25 mg via ORAL
  Filled 2021-08-30: qty 1

## 2021-08-30 MED ORDER — HYDROCODONE-ACETAMINOPHEN 5-325 MG PO TABS
2.0000 | ORAL_TABLET | Freq: Four times a day (QID) | ORAL | 0 refills | Status: AC | PRN
Start: 2021-08-30 — End: 2021-09-01

## 2021-08-30 MED ORDER — IBUPROFEN 600 MG PO TABS: 600.0000 mg | ORAL_TABLET | Freq: Four times a day (QID) | ORAL | 0 refills | Status: AC | PRN

## 2021-08-30 NOTE — Discharge Instructions (Signed)
EARLY LOSS (BEFORE 14 WEEKS)  What to expect -- Abdominal pain, cramps, and vaginal bleeding are expected during the medication abortion process. Some people also experience fever, nausea, vomiting, or diarrhea on the day they take the misoprostol. This may be uncomfortable or unpleasant, but most strong effects only last for a few hours.  ?Vaginal bleeding - It is normal to experience vaginal bleeding during an early medication abortion; this means the medication is working. The bleeding may be heavy, especially in the first few hours after you take the misoprostol. You will likely see clots and may see some pregnancy tissue, especially if you are farther along in your pregnancy (more than 8 or [redacted] weeks pregnant). Bleeding usually decreases after the pregnancy tissue passes out of your uterus. It may continue for several weeks but should be lighter than a menstrual period after the first few days.  If you soak through two full menstrual pads in an hour for two hours in a row and are still bleeding, you should contact your health care provider, clinic, or online pharmacy. If you do not have any bleeding at all after you take the medications, you should also contact them because this could mean the medications did not work.  ?Pain and cramps - It is normal to have abdominal pain and cramps after taking the second medication (misoprostol). They may be mild or strong. The pain usually improves after the pregnancy tissue has passed out of your uterus. For most people, this happens within 2 to 24 hours after taking the misoprostol.  You can take ibuprofen (sample brand names: Advil, Motrin) for pain if needed (unless there is a medical reason you cannot take this medication). You can also use a heating pad on your abdomen, but make sure it is not hot enough to burn you. Some clinicians give a prescription for a stronger pain medication to use if needed. If you have severe pain that is not relieved by these  treatments, call your health care provider or clinic immediately.  Use of UpToDate is subject to the Terms of Use. This generalized information is a limited summary of diagnosis, treatment, and/or medication information. It is not meant to be comprehensive and should be used as a tool to help the user understand and/or assess potential diagnostic and treatment options. It does NOT include all information about conditions, treatments, medications, side effects, or risks that may apply to a specific patient. It is not intended to be medical advice or a substitute for the medical advice, diagnosis, or treatment of a health care provider based on the health care provider's examination and assessment of a patient's specific and unique circumstances. Patients must speak with a health care provider for complete information about their health, medical questions, and treatment options, including any risks or benefits regarding use of medications. This information does not endorse any treatments or medications as safe, effective, or approved for treating a specific patient. UpToDate, Inc. and its affiliates disclaim any warranty or liability relating to this information or the use thereof. The use of this information is governed by the Terms of Use, available at https://www.wolterskluwer.com/en/know/clinical-effectiveness-terms 2022 UpToDate, Inc. and its affiliates and/or [place]. All rights reserved. Topic 8400 Version 39.0  ?Other side effects - Some people experience a mild fever, nausea, vomiting, or diarrhea after taking the second medication (misoprostol). These side effects usually go away quickly on their own without treatment. If you get a fever higher than 100.32F (38C) or if you have chills, vomiting,  or diarrhea that does not go away within several hours, call your health care provider or clinic.  If early medication abortion does not work in ending your pregnancy, you will need to have a procedural  abortion. Continuing a pregnancy after a medication abortion is not recommended because there is an increased risk of congenital anomalies in the fetus from the misoprostol. It is possible that your medication abortion was not successful if:  ?You do not have vaginal bleeding after taking the medications.  ?You still have pregnancy symptoms (breast tenderness, nausea) more than a week   ?You continue to bleed for more than two weeks   ?You have a positive pregnancy test four weeks   ?You do not have a menstrual period within six weeks   If you have any of these signs, contact your health care provider or clinic. They may want to see you or speak with you on the phone. They may suggest taking a home pregnancy test (if you have not already done so); the results, along with your symptoms, can help them figure out if the medication abortion was successful. However, it is important to be aware that home pregnancy tests will continue to be positive for at least several weeks after an abortion. Also, if your home pregnancy test is negative but you feel unwell or that something may be wrong, let your provider know.

## 2021-08-30 NOTE — MAU Provider Note (Signed)
History     CSN: [id number]  Arrival date and time: 08/30/21 1338   Event Date/Time   First Provider Initiated Contact with Patient 08/30/21 1631      Chief Complaint  Patient presents with   Vaginal Bleeding   HPI Lindsay Ramirez is [id number] at [redacted]w[redacted]d who presents to MAU with chief complaint of vaginal bleeding. This is a recurrent problem. Patient was evaluated for this complaint in MAU on 08/24/2021. She states her bleeding was originally triggered by sexual intercourse but has never stopped. She endorses bleeding to some degree throughout the day every day since her MAU visit.  Patient also reports lower abdominal cramping, new onset within the past day. Pain is suprapubic. Pain score is 5/10. She denies aggravating or alleviating factors. She has not taken medication or tried other treatments for this complaint. She is remote from sexual intercourse.  OB History     Gravida  8   Para  6   Term  4   Preterm  2   AB  1   Living  7      SAB  1   IAB  0   Ectopic  0   Multiple  1   Live Births  7           Past Medical History:  Diagnosis Date   Anemia    CHRONIC   Asthma    rarely uses inhaler,  [facility]   Bipolar affective disorder ([place])    Depression    PP 2013   Gout    feet - no meds   Headache(784.0)    otc med prn   Sickle cell trait ([place])    Vaginal Pap smear, abnormal     Past Surgical History:  Procedure Laterality Date   CESAREAN SECTION MULTI-GESTATIONAL N/A 09/19/2014   Procedure: CESAREAN SECTION MULTI-GESTATIONAL;  Surgeon: Shelly Bombard, MD;  Location: [facility];  Service: Obstetrics;  Laterality: N/A;   DILATION AND CURETTAGE OF UTERUS     DILATION AND EVACUATION N/A 11/04/2013   Procedure: DILATATION AND EVACUATION;  Surgeon: Alwyn Pea, MD;  Location: [facility];  Service: Gynecology;  Laterality: N/A;   WISDOM TOOTH EXTRACTION  2012    Family History  Problem Relation Age of Onset   Hypertension Mother     Asthma Mother    Heart disease Mother    Hyperlipidemia Mother    Thyroid disease Mother    Alcohol abuse Father    Cancer Maternal Grandmother 28       COLON   Depression Maternal Grandmother    Alcohol abuse Maternal Grandmother    Drug abuse Maternal Grandmother    Alcohol abuse Maternal Grandfather    Drug abuse Maternal Grandfather    Stroke Maternal Grandfather    Alcohol abuse Paternal Grandfather    Drug abuse Paternal Grandfather    Asthma Sister    Depression Sister    Diabetes Maternal Aunt    HIV Maternal Aunt    Asthma Sister     Social History   Tobacco Use   Smoking status: Never   Smokeless tobacco: Never  Vaping Use   Vaping Use: Never used  Substance Use Topics   Alcohol use: No    Alcohol/week: 0.0 standard drinks   Drug use: No    Allergies: No Known Allergies  Medications Prior to Admission  Medication Sig Dispense Refill Last Dose   Doxylamine-Pyridoxine (DICLEGIS) 10-10 MG TBEC Take two tablets at  bedtime, may add 1 tablet at breakfast and 1 tablet at lunch if needed. 100 tablet 2 08/29/2021   albuterol (VENTOLIN HFA) 108 (90 Base) MCG/ACT inhaler Inhale 2 puffs into the lungs every 6 (six) hours as needed for wheezing or shortness of breath. 1 each 0 More than a month    Review of Systems  Gastrointestinal:  Positive for abdominal pain.  Genitourinary:  Positive for vaginal bleeding.  All other systems reviewed and are negative. Physical Exam   Blood pressure 125/80, pulse 60, temperature 99.4 F (37.4 C), temperature source Oral, resp. rate 16, height 5\' 2"  (1.575 m), weight 88.4 kg, last menstrual period 06/25/2021, SpO2 100 %.  Physical Exam Vitals and nursing note reviewed. Exam conducted with a chaperone present.  Constitutional:      Appearance: Normal appearance.  Cardiovascular:     Rate and Rhythm: Normal rate and regular rhythm.     Pulses: Normal pulses.     Heart sounds: Normal heart sounds.  Pulmonary:     Effort:  Pulmonary effort is normal.     Breath sounds: Normal breath sounds.  Abdominal:     General: Abdomen is flat.     Tenderness: There is no abdominal tenderness.  Genitourinary:    Comments: Pelvic exam: Visible scant dried blood on labia. External genitalia otherwise normal, vaginal walls pink and well rugated, cervix visually closed, no lesions noted. Small to moderate reddish brown blood throughout vagina. Removed with fox swab x 2.    Skin:    Capillary Refill: Capillary refill takes less than 2 seconds.  Neurological:     Mental Status: She is alert and oriented to person, place, and time.  Psychiatric:        Mood and Affect: Mood normal.        Behavior: Behavior normal.        Thought Content: Thought content normal.        Judgment: Judgment normal.    MAU Course/MDM  Procedures: speculum exam, OB ultrasound  --Absent cardiac activity on bedside ultrasound  Orders Placed This Encounter  Procedures   US OB LESS THAN 14 WEEKS WITH OB TRANSVAGINAL   Urinalysis, Routine w reflex microscopic Urine, Clean Catch   CBC   Urinalysis, Microscopic (reflex)   Discharge patient   Patient Vitals for the past 24 hrs:  BP Temp Temp src Pulse Resp SpO2 Height Weight  08/30/21 1757 132/90 98.7 F (37.1 C) Oral (!) 57 18 100 % -- --  08/30/21 1520 -- -- -- -- -- 100 % -- --  08/30/21 1518 125/80 99.4 F (37.4 C) Oral 60 16 -- -- --  08/30/21 1457 -- -- -- -- -- -- 5\' 2"  (1.575 m) 88.4 kg   Results for orders placed or performed during the hospital encounter of 08/30/21 (from the past 24 hour(s))  Urinalysis, Routine w reflex microscopic Urine, Clean Catch     Status: Abnormal   Collection Time: 08/30/21  2:59 PM  Result Value Ref Range   Color, Urine YELLOW YELLOW   APPearance CLEAR CLEAR   Specific Gravity, Urine 1.010 1.005 - 1.030   pH 8.0 5.0 - 8.0   Glucose, UA NEGATIVE NEGATIVE mg/dL   Hgb urine dipstick LARGE (A) NEGATIVE   Bilirubin Urine NEGATIVE NEGATIVE   Ketones,  ur NEGATIVE NEGATIVE mg/dL   Protein, ur NEGATIVE NEGATIVE mg/dL   Nitrite NEGATIVE NEGATIVE   Leukocytes,Ua NEGATIVE NEGATIVE  Urinalysis, Microscopic (reflex)     Status: Abnormal  Collection Time: 08/30/21  2:59 PM  Result Value Ref Range   RBC / HPF >50 0 - 5 RBC/hpf   WBC, UA 0-5 0 - 5 WBC/hpf   Bacteria, UA RARE (A) NONE SEEN   Squamous Epithelial / LPF 0-5 0 - 5  CBC     Status: Abnormal   Collection Time: 08/30/21  3:16 PM  Result Value Ref Range   WBC 5.3 4.0 - 10.5 K/uL   RBC 3.98 3.87 - 5.11 MIL/uL   Hemoglobin 10.9 (L) 12.0 - 15.0 g/dL   HCT 33.7 (L) 36.0 - 46.0 %   MCV 84.7 80.0 - 100.0 fL   MCH 27.4 26.0 - 34.0 pg   MCHC 32.3 30.0 - 36.0 g/dL   RDW 13.0 11.5 - 15.5 %   Platelets 265 150 - 400 K/uL   nRBC 0.0 0.0 - 0.2 %   US OB LESS THAN 14 WEEKS WITH OB TRANSVAGINAL  Result Date: 08/30/2021 CLINICAL DATA:  Bleeding in first trimester of pregnancy, LMP 06/25/2021 EXAM: OBSTETRIC <14 WK [place] AND TRANSVAGINAL OB US TECHNIQUE: Both transabdominal and transvaginal ultrasound examinations were performed for complete evaluation of the gestation as well as the maternal uterus, adnexal regions, and pelvic cul-de-sac. Transvaginal technique was performed to assess early pregnancy. COMPARISON:  08/24/2021 FINDINGS: Intrauterine gestational sac: Present, single Yolk sac:  Present Embryo:  Present Cardiac Activity: Absent Heart Rate: N/A  bpm CRL:  21 mm   8 w   4 d                  [place] EDC: 04/07/2022 Subchorionic hemorrhage:  None visualized. Maternal uterus/adnexae: Ovaries unremarkable. Question small intramural leiomyoma within posterior mid uterus. No free pelvic fluid or adnexal masses. IMPRESSION: Intrauterine gestational sac seen containing a fetal pole and yolk sac. Fetal cardiac activity present on the previous exam is no longer identified. Findings meet definitive criteria for failed pregnancy. This follows SRU consensus guidelines: Diagnostic Criteria for Nonviable Pregnancy  Early in the First Trimester. Alison Stalling J Med [phone]. Electronically Signed   By: Lavonia Dana M.D.   On: 08/30/2021 16:14      Early Intrauterine Pregnancy Failure  X  Documented intrauterine pregnancy failure less than or equal to [redacted] weeks gestation  X  No serious current illness  X  Baseline Hgb greater than or equal to 10g/dl  X  Patient has easily accessible transportation to the hospital  X Clear preference  X  Practitioner/physician deems patient reliable  X  Counseling by practitioner or physician  X  Patient education by RN  N/A Rho-Gam given by RN if indicated  X  Medication dispensed   ___   Cytotec 800 mcg (given orally in MAU)    ___  Ibuprofen 600 mg 1 tablet by mouth every 6 hours as needed #30  ___  Hydrocodone/acetaminophen 5/325 mg by mouth every 4 to 6 hours as needed  ___  Phenergan 12.5 mg by mouth every 4 hours as needed for nausea    Meds ordered this encounter  Medications   misoprostol (CYTOTEC) tablet 800 mcg    Missed abortion   HYDROcodone-acetaminophen (NORCO/VICODIN) 5-325 MG per tablet 2 tablet   promethazine (PHENERGAN) tablet 25 mg   promethazine (PHENERGAN) 12.5 MG tablet    Sig: Take 1 tablet (12.5 mg total) by mouth every 6 (six) hours as needed for nausea or vomiting.    Dispense:  30 tablet    Refill:  0  ibuprofen (ADVIL) 600 MG tablet    Sig: Take 1 tablet (600 mg total) by mouth every 6 (six) hours as needed.    Dispense:  120 tablet    Refill:  0   HYDROcodone-acetaminophen (NORCO/VICODIN) 5-325 MG tablet    Sig: Take 2 tablets by mouth every 6 (six) hours as needed for up to 2 days for severe pain.    Dispense:  16 tablet    Refill:  0    Given Cytotec for Missed Abortion   Assessment and Plan  --35 y.o. [id number] with missed AB measuring 8w 4d --Cytotec given in MAU per patient preference --Blood type O POS --Extensive counseling on expectations for bleeding, pain, usage of prescribed medications --Discharge  home in stable condition with bleeding precautions  F/U: Office appointment in one week at [facility]. Message sent by Kieth Brightly, CNM 08/30/2021, 7:48 PM

## 2021-08-30 NOTE — MAU Note (Signed)
Was here a wk ago for bleeding, probable cause was intercourse.  Pt reports bleeding is heavier and it never stopped.  Changes pad frequently- doesn't like there to be blood on it, changing every 30 min.  Is having cramping in lower abd, started 2 days ago.

## 2021-09-05 ENCOUNTER — Other Ambulatory Visit: Payer: Self-pay

## 2021-09-05 ENCOUNTER — Encounter: Payer: Medicaid Other | Admitting: Medical

## 2021-09-05 ENCOUNTER — Ambulatory Visit: Payer: Medicaid Other | Admitting: Medical

## 2021-09-05 ENCOUNTER — Encounter: Payer: Self-pay | Admitting: Medical

## 2021-09-05 VITALS — BP 113/67 | HR 55 | Wt 194.0 lb

## 2021-09-05 DIAGNOSIS — O021 Missed abortion: Secondary | ICD-10-CM

## 2021-09-05 MED ORDER — MISOPROSTOL 200 MCG PO TABS: 800.0000 ug | ORAL_TABLET | Freq: Once | ORAL | 0 refills | Status: DC

## 2021-09-05 NOTE — Progress Notes (Signed)
History:  [name] is a 35 y.o. [id number] who presents to clinic today for follow-up after missed AB diagnosed in MAU on 08/30/21 and treated with Cytotec. The patient states bleeding has been similar daily since MAU visit without significant increase or POC noted. She denies pain or fever today.    The following portions of the patient's history were reviewed and updated as appropriate: allergies, current medications, family history, past medical history, social history, past surgical history and problem list.  Review of Systems:  Review of Systems  Constitutional:  Negative for chills and fever.  Gastrointestinal:  Negative for abdominal pain.  Genitourinary:        + vaginal bleeding     Objective:  Physical Exam BP 113/67    Pulse (!) 55    Wt 194 lb (88 kg)    LMP 06/25/2021    BMI 35.48 kg/m  Physical Exam Vitals and nursing note reviewed.  Constitutional:      General: She is not in acute distress.    Appearance: Normal appearance. She is well-developed.  Pulmonary:     Effort: Pulmonary effort is normal.  Abdominal:     General: There is no distension.     Palpations: Abdomen is soft.  Genitourinary:    General: Normal vulva.     Vagina: No vaginal discharge or bleeding.     Cervix: No cervical motion tenderness, discharge or friability.     Uterus: Not enlarged and not tender.      Adnexa:        Right: No mass or tenderness.         Left: No mass or tenderness.    Skin:    General: Skin is warm and dry.     Findings: No erythema.  Neurological:     Mental Status: She is alert and oriented to person, place, and time.  Psychiatric:        Mood and Affect: Mood normal.   US OB Transvaginal  Result Date: 08/24/2021 CLINICAL DATA:  Vaginal bleeding in pregnancy EXAM: TRANSVAGINAL OB ULTRASOUND TECHNIQUE: Transvaginal ultrasound was performed for complete evaluation of the gestation as well as the maternal uterus, adnexal regions, and pelvic  cul-de-sac. COMPARISON:  None. FINDINGS: Intrauterine gestational sac: Single Yolk sac:  Visualized. Embryo:  Visualized. Cardiac Activity: Visualized. Heart Rate: 154 bpm CRL:   15.6 mm   7 w 6 d                  [place] EDC: 04/06/22 Subchorionic hemorrhage:  None visualized. Maternal uterus/adnexae: There is a hypoechoic mass in the uterus measuring 1.5 x 1.2 x 1.4 cm, possibly a fibroid. Normal appearance of the bilateral ovaries. No free fluid in the pelvis. IMPRESSION: 1. Single viable intrauterine pregnancy with estimated gestational age [redacted] weeks 6 days. 2.  Small mass in the uterus measuring 1.5 cm, possibly a fibroid. Electronically Signed   By: Audie Pinto M.D.   On: 08/24/2021 14:56   US OB LESS THAN 14 WEEKS WITH OB TRANSVAGINAL  Result Date: 08/30/2021 CLINICAL DATA:  Bleeding in first trimester of pregnancy, LMP 06/25/2021 EXAM: OBSTETRIC <14 WK [place] AND TRANSVAGINAL OB US TECHNIQUE: Both transabdominal and transvaginal ultrasound examinations were performed for complete evaluation of the gestation as well as the maternal uterus, adnexal regions, and pelvic cul-de-sac. Transvaginal technique was performed to assess early pregnancy. COMPARISON:  08/24/2021 FINDINGS: Intrauterine gestational sac: Present, single Yolk sac:  Present Embryo:  Present Cardiac  Activity: Absent Heart Rate: N/A  bpm CRL:  21 mm   8 w   4 d                  [place] EDC: 04/07/2022 Subchorionic hemorrhage:  None visualized. Maternal uterus/adnexae: Ovaries unremarkable. Question small intramural leiomyoma within posterior mid uterus. No free pelvic fluid or adnexal masses. IMPRESSION: Intrauterine gestational sac seen containing a fetal pole and yolk sac. Fetal cardiac activity present on the previous exam is no longer identified. Findings meet definitive criteria for failed pregnancy. This follows SRU consensus guidelines: Diagnostic Criteria for Nonviable Pregnancy Early in the First Trimester. Alison Stalling J Med [phone].  Electronically Signed   By: Lavonia Dana M.D.   On: 08/30/2021 16:14  -   Health Maintenance Due  Topic Date Due   COVID-19 Vaccine (1) Never done   Pneumococcal Vaccine 38-23 Years old (1 - PCV) Never done   Hepatitis C Screening  Never done   INFLUENZA VACCINE  04/08/2021   PAP SMEAR-Modifier  05/20/2021     Assessment & Plan:  1. Missed ab - Based on bleeding described by patient, concern pregnancy has not passed - In office [place] confirmed Mariano Colon without FHR still present - Patient is stable and afebrile - Discussed options of repeat Cytotec vs D&E and patient would like to try Cytotec again - misoprostol (CYTOTEC) 200 MCG tablet; Place 4 tablets (800 mcg total) vaginally once for 1 dose.  Dispense: 4 tablet; Refill: 0 - Patient advised to send me a MyChart message if no results in 24 hours so that D&E can be scheduled for next week - Bleeding precautions and warning signs for worsening condition reviewed  - Hgb was normal 12/23, did not repeat today  Approximately 20 minutes of total time was spent with this patient on history taking, coordination of care, patient education and documentation.   Luvenia Redden, PA-C 09/05/2021 11:39 AM

## 2021-09-05 NOTE — Progress Notes (Signed)
Pt is in office for follow up to recent SAB. Pt states she is still having heavy bleeding, denies any pain.  Pt would like to discuss when she may try to conceive.

## 2021-09-06 ENCOUNTER — Telehealth: Payer: Self-pay | Admitting: Medical

## 2021-09-06 ENCOUNTER — Inpatient Hospital Stay (HOSPITAL_COMMUNITY)
Admission: AD | Admit: 2021-09-06 | Discharge: 2021-09-06 | Disposition: A | Discharge status: Home / Self Care | Payer: Medicaid Other | Attending: Obstetrics and Gynecology | Admitting: Obstetrics and Gynecology

## 2021-09-06 DIAGNOSIS — Z3A10 10 weeks gestation of pregnancy: Secondary | ICD-10-CM

## 2021-09-06 DIAGNOSIS — O034 Incomplete spontaneous abortion without complication: Secondary | ICD-10-CM

## 2021-09-06 DIAGNOSIS — Z3480 Encounter for supervision of other normal pregnancy, unspecified trimester: Secondary | ICD-10-CM

## 2021-09-06 LAB — COMPREHENSIVE METABOLIC PANEL
ALT: 11 U/L (ref 0–44)
AST: 16 U/L (ref 15–41)
Albumin: 3.7 g/dL (ref 3.5–5.0)
Alkaline Phosphatase: 39 U/L (ref 38–126)
Anion gap: 6 (ref 5–15)
BUN: 5 mg/dL — ABNORMAL LOW (ref 6–20)
CO2: 25 mmol/L (ref 22–32)
Calcium: 9.1 mg/dL (ref 8.9–10.3)
Chloride: 104 mmol/L (ref 98–111)
Creatinine, Ser: 0.71 mg/dL (ref 0.44–1.00)
GFR, Estimated: >60 mL/min (ref >60)
Glucose, Bld: 89 mg/dL (ref 70–99)
Potassium: 3.5 mmol/L (ref 3.5–5.1)
Sodium: 135 mmol/L (ref 135–145)
Total Bilirubin: 0.6 mg/dL (ref 0.3–1.2)
Total Protein: 6.9 g/dL (ref 6.5–8.1)

## 2021-09-06 LAB — CBC WITH DIFFERENTIAL/PLATELET
Abs Immature Granulocytes: 0.01 10*3/uL (ref 0.00–0.07)
Basophils Absolute: 0 10*3/uL (ref 0.0–0.1)
Basophils Relative: 0 %
Eosinophils Absolute: 0.1 10*3/uL (ref 0.0–0.5)
Eosinophils Relative: 2 %
HCT: 33.5 % — ABNORMAL LOW (ref 36.0–46.0)
Hemoglobin: 11.1 g/dL — ABNORMAL LOW (ref 12.0–15.0)
Immature Granulocytes: 0 %
Lymphocytes Relative: 32 %
Lymphs Abs: 2.2 10*3/uL (ref 0.7–4.0)
MCH: 27.6 pg (ref 26.0–34.0)
MCHC: 33.1 g/dL (ref 30.0–36.0)
MCV: 83.3 fL (ref 80.0–100.0)
Monocytes Absolute: 0.6 10*3/uL (ref 0.1–1.0)
Monocytes Relative: 9 %
Neutro Abs: 3.7 10*3/uL (ref 1.7–7.7)
Neutrophils Relative %: 57 %
Platelets: 258 10*3/uL (ref 150–400)
RBC: 4.02 MIL/uL (ref 3.87–5.11)
RDW: 12.7 % (ref 11.5–15.5)
WBC: 6.7 10*3/uL (ref 4.0–10.5)
nRBC: 0 % (ref 0.0–0.2)

## 2021-09-06 LAB — HCG, QUANTITATIVE, PREGNANCY: hCG, Beta Chain, Quant, S: 5908 m[IU]/mL — ABNORMAL HIGH (ref <5)

## 2021-09-06 LAB — TYPE AND SCREEN
ABO/RH(D): O POS
Antibody Screen: NEGATIVE

## 2021-09-06 NOTE — MAU Note (Signed)
PT SAYS SHE WAS HERE  1 WEEK  AGO- SHE MISCARRIED- LABS AND U/S GAVE CYTOTEC- DIDN'T PASS ANYTHING  SO WENT TO [facility] YESTERDAY  DID ANOTHER U/S - PREG IS STILL IN UTERUS SO GAVE MORE CYTOTEC- TOOK AT 10PM- LAST NIGHT   ALL DAY TODAY- SAME BLEEDING- DOES NOT THINK SHE PASSED ANYTHING  FEELS PERIOD CRAMPING  IS  [facility] FOR D/C NEXT Friday ON 6TH- BUT THEY CALLED TODAY AND SHE TOLD THEM SHE HAD A FEVER -100.4- TOLD TO COME HERE- TO SEE IF NEEDS D/C SOONER

## 2021-09-06 NOTE — Telephone Encounter (Signed)
Telephone call to patient to notify her of her scheduled D&E.  Posted for 09/13/21 at 10:15 at [facility]    Patient did report she now has a fever of 100.4.  Notified provider and advised patient to go to MAU for evaluation.

## 2021-09-06 NOTE — Discharge Instructions (Signed)
Keep your appointment for surgery on 09/13/2021.  Return to MAU: If you have heavy bleeding that soaks through more that 2 pads per hour for an hour or more If you bleed so much that you feel like you might pass out or you do pass out If you have significant abdominal pain that is not improved with Tylenol 1000 mg every 8 hours as needed for pain If you develop a fever > 100.5 and not relieved by Tylenol

## 2021-09-06 NOTE — MAU Provider Note (Signed)
History     CSN: [id number]  Arrival date and time: 09/06/21 1839   Event Date/Time   First Provider Initiated Contact with Patient 09/06/21 2102      Chief Complaint  Patient presents with   Abdominal Pain   HPI [name] is a 35 y.o. year old [id number] female at [redacted]w[redacted]d weeks gestation who presents to MAU reporting she was in MAU 1 week ago with miscarriage. She was given Cytotec, but has not passed the pregnancy yet. She was seen at [facility] on 09/05/2021 where she had an US showing the pregnancy is still in her uterus. She took more Cytotec at 2200 last night and has not passed anything. She has only had period like cramping. She reports the same amount of bleeding she has previously had. She does not think she has passed anything today either. She reports having a fever today of 100.4 and was told to come here for evaluation. She is scheduled for a D&E on 09/13/2021.   OB History     Gravida  8   Para  6   Term  4   Preterm  2   AB  1   Living  7      SAB  1   IAB  0   Ectopic  0   Multiple  1   Live Births  7           Past Medical History:  Diagnosis Date   Anemia    CHRONIC   Asthma    rarely uses inhaler,  [facility]   Bipolar affective disorder ([place])    Depression    PP 2013   Gout    feet - no meds   Headache(784.0)    otc med prn   Sickle cell trait ([place])    Vaginal Pap smear, abnormal     Past Surgical History:  Procedure Laterality Date   CESAREAN SECTION MULTI-GESTATIONAL N/A 09/19/2014   Procedure: CESAREAN SECTION MULTI-GESTATIONAL;  Surgeon: Shelly Bombard, MD;  Location: [facility];  Service: Obstetrics;  Laterality: N/A;   DILATION AND CURETTAGE OF UTERUS     DILATION AND EVACUATION N/A 11/04/2013   Procedure: DILATATION AND EVACUATION;  Surgeon: Alwyn Pea, MD;  Location: [facility];  Service: Gynecology;  Laterality: N/A;   WISDOM TOOTH EXTRACTION  2012    Family History  Problem Relation Age of Onset    Hypertension Mother    Asthma Mother    Heart disease Mother    Hyperlipidemia Mother    Thyroid disease Mother    Alcohol abuse Father    Cancer Maternal Grandmother 86       COLON   Depression Maternal Grandmother    Alcohol abuse Maternal Grandmother    Drug abuse Maternal Grandmother    Alcohol abuse Maternal Grandfather    Drug abuse Maternal Grandfather    Stroke Maternal Grandfather    Alcohol abuse Paternal Grandfather    Drug abuse Paternal Grandfather    Asthma Sister    Depression Sister    Diabetes Maternal Aunt    HIV Maternal Aunt    Asthma Sister     Social History   Tobacco Use   Smoking status: Never   Smokeless tobacco: Never  Vaping Use   Vaping Use: Never used  Substance Use Topics   Alcohol use: No    Alcohol/week: 0.0 standard drinks   Drug use: No    Allergies: No Known Allergies  Medications Prior to Admission  Medication Sig Dispense Refill Last Dose   albuterol (VENTOLIN HFA) 108 (90 Base) MCG/ACT inhaler Inhale 2 puffs into the lungs every 6 (six) hours as needed for wheezing or shortness of breath. 1 each 0    ibuprofen (ADVIL) 600 MG tablet Take 1 tablet (600 mg total) by mouth every 6 (six) hours as needed. 120 tablet 0    misoprostol (CYTOTEC) 200 MCG tablet Place 4 tablets (800 mcg total) vaginally once for 1 dose. 4 tablet 0    promethazine (PHENERGAN) 12.5 MG tablet Take 1 tablet (12.5 mg total) by mouth every 6 (six) hours as needed for nausea or vomiting. 30 tablet 0     Review of Systems  Constitutional: Negative.   HENT: Negative.    Eyes: Negative.   Respiratory: Negative.    Cardiovascular: Negative.   Gastrointestinal: Negative.   Endocrine: Negative.   Genitourinary:  Positive for pelvic pain (cramping) and vaginal bleeding.  Musculoskeletal: Negative.   Skin: Negative.   Allergic/Immunologic: Negative.   Neurological: Negative.   Hematological: Negative.   Psychiatric/Behavioral: Negative.     Physical Exam    Blood pressure 118/68, pulse (!) 54, temperature 98.8 F (37.1 C), temperature source Oral, resp. rate 18, height 5\' 2"  (1.575 m), weight 88.3 kg, last menstrual period 06/25/2021.  Physical Exam Vitals and nursing note reviewed.  Constitutional:      Appearance: Normal appearance. She is obese.  Cardiovascular:     Rate and Rhythm: Normal rate.  Pulmonary:     Effort: Pulmonary effort is normal.  Abdominal:     Palpations: Abdomen is soft.     Tenderness: There is no abdominal tenderness. There is no guarding or rebound.  Genitourinary:    Comments: deferred Musculoskeletal:        General: Normal range of motion.  Skin:    General: Skin is warm and dry.  Neurological:     Mental Status: She is alert and oriented to person, place, and time.  Psychiatric:        Mood and Affect: Mood normal.        Behavior: Behavior normal.        Thought Content: Thought content normal.        Judgment: Judgment normal.    MAU Course  Procedures  MDM CBC w/Diff Type and Rh HCG  Results for orders placed or performed during the hospital encounter of 09/06/21 (from the past 24 hour(s))  CBC with Differential/Platelet     Status: Abnormal   Collection Time: 09/06/21  7:09 PM  Result Value Ref Range   WBC 6.7 4.0 - 10.5 K/uL   RBC 4.02 3.87 - 5.11 MIL/uL   Hemoglobin 11.1 (L) 12.0 - 15.0 g/dL   HCT 33.5 (L) 36.0 - 46.0 %   MCV 83.3 80.0 - 100.0 fL   MCH 27.6 26.0 - 34.0 pg   MCHC 33.1 30.0 - 36.0 g/dL   RDW 12.7 11.5 - 15.5 %   Platelets 258 150 - 400 K/uL   nRBC 0.0 0.0 - 0.2 %   Neutrophils Relative % 57 %   Neutro Abs 3.7 1.7 - 7.7 K/uL   Lymphocytes Relative 32 %   Lymphs Abs 2.2 0.7 - 4.0 K/uL   Monocytes Relative 9 %   Monocytes Absolute 0.6 0.1 - 1.0 K/uL   Eosinophils Relative 2 %   Eosinophils Absolute 0.1 0.0 - 0.5 K/uL   Basophils Relative 0 %   Basophils Absolute  0.0 0.0 - 0.1 K/uL   Immature Granulocytes 0 %   Abs Immature Granulocytes 0.01 0.00 - 0.07 K/uL   Comprehensive metabolic panel     Status: Abnormal   Collection Time: 09/06/21  7:09 PM  Result Value Ref Range   Sodium 135 135 - 145 mmol/L   Potassium 3.5 3.5 - 5.1 mmol/L   Chloride 104 98 - 111 mmol/L   CO2 25 22 - 32 mmol/L   Glucose, Bld 89 70 - 99 mg/dL   BUN 5 (L) 6 - 20 mg/dL   Creatinine, Ser 0.71 0.44 - 1.00 mg/dL   Calcium 9.1 8.9 - 10.3 mg/dL   Total Protein 6.9 6.5 - 8.1 g/dL   Albumin 3.7 3.5 - 5.0 g/dL   AST 16 15 - 41 U/L   ALT 11 0 - 44 U/L   Alkaline Phosphatase 39 38 - 126 U/L   Total Bilirubin 0.6 0.3 - 1.2 mg/dL   GFR, Estimated >60 >60 mL/min   Anion gap 6 5 - 15  Type and screen [place]     Status: None   Collection Time: 09/06/21  7:09 PM  Result Value Ref Range   ABO/RH(D) O POS    Antibody Screen NEG    Sample Expiration      09/09/2021,2359 Performed at [facility], [place] [address], [place], [place] [address]   hCG, quantitative, pregnancy     Status: Abnormal   Collection Time: 09/06/21  7:09 PM  Result Value Ref Range   hCG, Beta Chain, Quant, S 5,908 (H) <5 mIU/mL    *Consult with Dr. Elgie Congo @ 2045 - notified of patient's complaints, assessments, and lab, tx plan d/c home and keep D&E appointment, return to MAU for temp over 100.5, heavy vaginal bleeding and/or pain not relieved by Tyenol - ok to d/c home, agrees with plan   Assessment and Plan  Incomplete miscarriage  - Keep your appointment for surgery on 09/13/2021.  - Return to MAU: If you have heavy bleeding that soaks through more that 2 pads per hour for an hour or more If you bleed so much that you feel like you might pass out or you do pass out If you have significant abdominal pain that is not improved with Tylenol 1000 mg every 8 hours as needed for pain If you develop a fever > 100.5 and not relieved by Tylenol  [redacted] weeks gestation of pregnancy   - Discharge patient - F/U with [facility] - Patient verbalized an understanding of the plan of care and agrees.    Laury Deep, CNM 09/06/2021, 9:02 PM

## 2021-09-11 ENCOUNTER — Other Ambulatory Visit: Payer: Self-pay

## 2021-09-11 ENCOUNTER — Encounter (HOSPITAL_COMMUNITY): Payer: Self-pay | Admitting: Obstetrics and Gynecology

## 2021-09-11 NOTE — Progress Notes (Signed)
PCP - Denies Cardiologist - Denies  PPM/ICD - Denies  Chest x-ray - 06/03/13 EKG - 09/25/19 Stress Test - Denies ECHO - Denies Cardiac Cath - Denies  CPAP - Denies  ERAS Protcol - NPO  COVID TEST- N/A Ambulatory sx  Anesthesia review: N  Patient verbally denies any shortness of breath, fever, cough and chest pain during phone call   -------------  SDW INSTRUCTIONS given:  Your procedure is scheduled on 09/13/21.  Report to [facility] Main Entrance "A" at [email or web address].M., and check in at the Admitting office.  Call this number if you have problems the morning of surgery:  [phone]   Remember:  Do not eat after midnight the night before your surgery    Take these medicines the morning of surgery with A SIP OF WATER: Phenergan Albuterol (Bring with you on day of surgery)  As of today, STOP taking any Aspirin (unless otherwise instructed by your surgeon) Aleve, Naproxen, Ibuprofen, Motrin, Advil, Goody's, BC's, all herbal medications, fish oil, and all vitamins.                      Do not wear jewelry, make up, or nail polish            Do not wear lotions, powders, perfumes/colognes, or deodorant.            Do not shave 48 hours prior to surgery.  Men may shave face and neck.            Do not bring valuables to the hospital.            [facility] is not responsible for any belongings or valuables.  Do NOT Smoke (Tobacco/Vaping) or drink Alcohol 24 hours prior to your procedure If you use a CPAP at night, you may bring all equipment for your overnight stay.   Contacts, glasses, dentures or bridgework may not be worn into surgery.      For patients admitted to the hospital, discharge time will be determined by your treatment team.   Patients discharged the day of surgery will not be allowed to drive home, and someone needs to stay with them for 24 hours.    Special instructions:   [facility]- Preparing For Surgery  Before surgery, you can play an important role.  Because skin is not sterile, your skin needs to be as free of germs as possible. You can reduce the number of germs on your skin by washing with CHG (chlorahexidine gluconate) Soap before surgery.  CHG is an antiseptic cleaner which kills germs and bonds with the skin to continue killing germs even after washing.    Oral Hygiene is also important to reduce your risk of infection.  Remember - BRUSH YOUR TEETH THE MORNING OF SURGERY WITH YOUR REGULAR TOOTHPASTE  Please do not use if you have an allergy to CHG or antibacterial soaps. If your skin becomes reddened/irritated stop using the CHG.  Do not shave (including legs and underarms) for at least 48 hours prior to first CHG shower. It is OK to shave your face.  Please follow these instructions carefully.   Shower the NIGHT BEFORE SURGERY and the MORNING OF SURGERY with DIAL Soap.   Pat yourself dry with a CLEAN TOWEL.  Wear CLEAN PAJAMAS to bed the night before surgery  Place CLEAN SHEETS on your bed the night of your first shower and DO NOT SLEEP WITH PETS.   Day of Surgery: Please shower  morning of surgery  Wear Clean/Comfortable clothing the morning of surgery Do not apply any deodorants/lotions.   Remember to brush your teeth WITH YOUR REGULAR TOOTHPASTE.   Questions were answered. Patient verbalized understanding of instructions.

## 2021-09-12 NOTE — H&P (Signed)
Lindsay Ramirez is an 36 y.o. female who presents for D&E for miscarriage. She has attempted miso twice (12/23 and redose sent on 12/29). She has not passed the miscarriage with both doses. She desires definitive management at this time.   By [place] on 12/23, CRL was 71mm with no cardiac activity.   Her beta was checked on 12/17 and it was 13,638. On 12/30 it was 5908.   She is Rh positive.   Pertinent Gynecological History: Contraception: none Blood transfusions: none Previous GYN Procedures: DNC and c-sections   Last pap: normal, HPV negative; Date: 05/2018 OB History: G8, P7   Menstrual History: Patient's last menstrual period was 06/25/2021.    Past Medical History:  Diagnosis Date   Anemia    CHRONIC   Asthma    rarely uses inhaler,  [facility]   Bipolar affective disorder ([place])    COVID-19 09/2020   Depression    PP 2013   Gout    feet - no meds   Headache(784.0)    otc med prn   Sickle cell trait ([place])    Vaginal Pap smear, abnormal     Past Surgical History:  Procedure Laterality Date   CESAREAN SECTION MULTI-GESTATIONAL N/A 09/19/2014   Procedure: CESAREAN SECTION MULTI-GESTATIONAL;  Surgeon: Shelly Bombard, MD;  Location: [facility];  Service: Obstetrics;  Laterality: N/A;   DILATION AND CURETTAGE OF UTERUS     DILATION AND EVACUATION N/A 11/04/2013   Procedure: DILATATION AND EVACUATION;  Surgeon: Alwyn Pea, MD;  Location: [facility];  Service: Gynecology;  Laterality: N/A;   WISDOM TOOTH EXTRACTION  2012    Family History  Problem Relation Age of Onset   Hypertension Mother    Asthma Mother    Heart disease Mother    Hyperlipidemia Mother    Thyroid disease Mother    Alcohol abuse Father    Cancer Maternal Grandmother 12       COLON   Depression Maternal Grandmother    Alcohol abuse Maternal Grandmother    Drug abuse Maternal Grandmother    Alcohol abuse Maternal Grandfather    Drug abuse Maternal Grandfather    Stroke Maternal Grandfather     Alcohol abuse Paternal Grandfather    Drug abuse Paternal Grandfather    Asthma Sister    Depression Sister    Diabetes Maternal Aunt    HIV Maternal Aunt    Asthma Sister     Social History:  reports that she has never smoked. She has never used smokeless tobacco. She reports that she does not drink alcohol and does not use drugs.  Allergies: No Known Allergies  No medications prior to admission.    Review of Systems  Constitutional: Negative.   HENT: Negative.    Eyes: Negative.   Respiratory: Negative.    Cardiovascular: Negative.   Gastrointestinal: Negative.   Endocrine: Negative.   Genitourinary:  Positive for vaginal bleeding.  Musculoskeletal: Negative.   Neurological: Negative.   Psychiatric/Behavioral: Negative.     Height 5\' 2"  (1.575 m), weight 88 kg, last menstrual period 06/25/2021. Physical Exam Constitutional:      Appearance: Normal appearance. She is normal weight.  HENT:     Head: Normocephalic and atraumatic.     Nose: Nose normal.     Mouth/Throat:     Mouth: Mucous membranes are moist.     Pharynx: Oropharynx is clear.  Eyes:     Extraocular Movements: Extraocular movements intact.  Conjunctiva/sclera: Conjunctivae normal.     Pupils: Pupils are equal, round, and reactive to light.  Cardiovascular:     Rate and Rhythm: Normal rate and regular rhythm.  Pulmonary:     Effort: Pulmonary effort is normal.     Breath sounds: Normal breath sounds.  Abdominal:     General: Abdomen is flat. Bowel sounds are normal.     Palpations: Abdomen is soft.  Musculoskeletal:        General: Normal range of motion.     Cervical back: Normal range of motion.  Skin:    General: Skin is warm.  Neurological:     General: No focal deficit present.     Mental Status: She is alert and oriented to person, place, and time. Mental status is at baseline.  Psychiatric:        Mood and Affect: Mood normal.        Behavior: Behavior normal.        Thought  Content: Thought content normal.        Judgment: Judgment normal.    No results found for this or any previous visit (from the past 24 hour(s)).  No results found. - See HPI  Assessment/Plan: Missed abortion - Discussed options: expectant management, misoprostol and D&E. Discussed the risks and benefits to each.  She has already tried miso without success.  - We discussed genetics: we reviewed the benefits - she Accepts - Risks of surgery include but are not limited to: bleeding, infection, injury to surrounding organs/tissues (i.e. bowel/bladder/ureters), need for additional procedures, wound complications, hospital re-admission, and conversion to open surgery - We discussed postop restrictions, precautions and expectations. We discussed typical hospital course and stay.  - She would like to proceed with D&E as scheduled today. She has no questions.  - For post-procedure birth control, she would like nothing. She declines.    Radene Gunning 09/12/2021, 3:33 PM

## 2021-09-13 ENCOUNTER — Encounter (HOSPITAL_COMMUNITY)
Admission: RE | Disposition: A | Discharge status: Home / Self Care | Payer: Self-pay | Source: Home / Self Care | Attending: Obstetrics and Gynecology

## 2021-09-13 ENCOUNTER — Ambulatory Visit (HOSPITAL_COMMUNITY)
Admission: RE | Admit: 2021-09-13 | Discharge: 2021-09-13 | Disposition: A | Discharge status: Home / Self Care | Payer: Medicaid Other | Attending: Obstetrics and Gynecology | Admitting: Obstetrics and Gynecology

## 2021-09-13 ENCOUNTER — Encounter (HOSPITAL_COMMUNITY): Payer: Self-pay | Admitting: Obstetrics and Gynecology

## 2021-09-13 ENCOUNTER — Ambulatory Visit (HOSPITAL_COMMUNITY): Payer: Medicaid Other | Admitting: Certified Registered"

## 2021-09-13 ENCOUNTER — Other Ambulatory Visit: Payer: Self-pay

## 2021-09-13 DIAGNOSIS — D573 Sickle-cell trait: Secondary | ICD-10-CM | POA: Insufficient documentation

## 2021-09-13 DIAGNOSIS — O021 Missed abortion: Secondary | ICD-10-CM | POA: Diagnosis present

## 2021-09-13 DIAGNOSIS — O039 Complete or unspecified spontaneous abortion without complication: Secondary | ICD-10-CM | POA: Diagnosis not present

## 2021-09-13 HISTORY — PX: DILATION AND EVACUATION: SHX1459

## 2021-09-13 LAB — TYPE AND SCREEN
ABO/RH(D): O POS
Antibody Screen: NEGATIVE

## 2021-09-13 SURGERY — DILATION AND EVACUATION, UTERUS
Anesthesia: General

## 2021-09-13 MED ORDER — ORAL CARE MOUTH RINSE: 15.0000 mL | Freq: Once | OROMUCOSAL | Status: AC

## 2021-09-13 MED ORDER — DOXYCYCLINE HYCLATE 100 MG IV SOLR
200.0000 mg | INTRAVENOUS | Status: AC
  Administered 2021-09-13: 200 mg via INTRAVENOUS
  Filled 2021-09-13: qty 200

## 2021-09-13 MED ORDER — FENTANYL CITRATE (PF) 250 MCG/5ML IJ SOLN
INTRAMUSCULAR | Status: DC | PRN
  Administered 2021-09-13: 50 ug via INTRAVENOUS

## 2021-09-13 MED ORDER — CHLORHEXIDINE GLUCONATE 0.12 % MT SOLN
OROMUCOSAL | Status: AC
  Administered 2021-09-13: 15 mL via OROMUCOSAL
  Filled 2021-09-13: qty 15

## 2021-09-13 MED ORDER — MIDAZOLAM HCL 2 MG/2ML IJ SOLN
INTRAMUSCULAR | Status: DC | PRN
Start: 2021-09-13 — End: 2021-09-13
  Administered 2021-09-13: 2 mg via INTRAVENOUS

## 2021-09-13 MED ORDER — LIDOCAINE 2% (20 MG/ML) 5 ML SYRINGE
INTRAMUSCULAR | Status: AC
  Filled 2021-09-13: qty 5

## 2021-09-13 MED ORDER — MIDAZOLAM HCL 2 MG/2ML IJ SOLN
INTRAMUSCULAR | Status: AC
  Filled 2021-09-13: qty 2

## 2021-09-13 MED ORDER — LIDOCAINE 2% (20 MG/ML) 5 ML SYRINGE
INTRAMUSCULAR | Status: DC | PRN
  Administered 2021-09-13: 100 mg via INTRAVENOUS

## 2021-09-13 MED ORDER — KETOROLAC TROMETHAMINE 15 MG/ML IJ SOLN: 15.0000 mg | INTRAMUSCULAR | Status: AC

## 2021-09-13 MED ORDER — ONDANSETRON HCL 4 MG/2ML IJ SOLN
INTRAMUSCULAR | Status: DC | PRN
  Administered 2021-09-13: 4 mg via INTRAVENOUS

## 2021-09-13 MED ORDER — FENTANYL CITRATE (PF) 250 MCG/5ML IJ SOLN
INTRAMUSCULAR | Status: AC
  Filled 2021-09-13: qty 5

## 2021-09-13 MED ORDER — ACETAMINOPHEN 500 MG PO TABS
ORAL_TABLET | ORAL | Status: AC
  Administered 2021-09-13: 1000 mg via ORAL
  Filled 2021-09-13: qty 2

## 2021-09-13 MED ORDER — PROPOFOL 10 MG/ML IV BOLUS
INTRAVENOUS | Status: DC | PRN
  Administered 2021-09-13: 200 mg via INTRAVENOUS

## 2021-09-13 MED ORDER — CHLORHEXIDINE GLUCONATE 0.12 % MT SOLN: 15.0000 mL | Freq: Once | OROMUCOSAL | Status: AC

## 2021-09-13 MED ORDER — LACTATED RINGERS IV SOLN: INTRAVENOUS | Status: DC

## 2021-09-13 MED ORDER — DEXAMETHASONE SODIUM PHOSPHATE 10 MG/ML IJ SOLN
INTRAMUSCULAR | Status: DC | PRN
  Administered 2021-09-13: 10 mg via INTRAVENOUS

## 2021-09-13 MED ORDER — POVIDONE-IODINE 10 % EX SWAB
2.0000 "application " | Freq: Once | CUTANEOUS | Status: AC
  Administered 2021-09-13: 2 via TOPICAL

## 2021-09-13 MED ORDER — ACETAMINOPHEN 500 MG PO TABS: 1000.0000 mg | ORAL_TABLET | ORAL | Status: AC

## 2021-09-13 MED ORDER — DEXAMETHASONE SODIUM PHOSPHATE 10 MG/ML IJ SOLN
INTRAMUSCULAR | Status: AC
  Filled 2021-09-13: qty 1

## 2021-09-13 MED ORDER — KETOROLAC TROMETHAMINE 15 MG/ML IJ SOLN
INTRAMUSCULAR | Status: AC
  Administered 2021-09-13: 15 mg via INTRAVENOUS
  Filled 2021-09-13: qty 1

## 2021-09-13 MED ORDER — LIDOCAINE-EPINEPHRINE 1 %-1:100000 IJ SOLN
INTRAMUSCULAR | Status: AC
  Filled 2021-09-13: qty 1

## 2021-09-13 MED ORDER — ONDANSETRON HCL 4 MG/2ML IJ SOLN
INTRAMUSCULAR | Status: AC
  Filled 2021-09-13: qty 2

## 2021-09-13 MED ORDER — FENTANYL CITRATE (PF) 100 MCG/2ML IJ SOLN: 25.0000 ug | INTRAMUSCULAR | Status: DC | PRN

## 2021-09-13 SURGICAL SUPPLY — 20 items
CATH ROBINSON RED A/P 16FR (CATHETERS) ×2 IMPLANT
DECANTER SPIKE VIAL GLASS SM (MISCELLANEOUS) ×2 IMPLANT
FILTER UTR ASPR ASSEMBLY (MISCELLANEOUS) ×2 IMPLANT
GLOVE SURG ENC MOIS LTX SZ6 (GLOVE) ×2 IMPLANT
GLOVE SURG UNDER LTX SZ6.5 (GLOVE) ×4 IMPLANT
GOWN STRL REUS W/ TWL LRG LVL3 (GOWN DISPOSABLE) ×2 IMPLANT
GOWN STRL REUS W/TWL LRG LVL3 (GOWN DISPOSABLE) ×4
HOSE CONNECTING 18IN BERKELEY (TUBING) ×2 IMPLANT
KIT BERKELEY 1ST TRI 3/8 NO TR (MISCELLANEOUS) ×2 IMPLANT
KIT BERKELEY 1ST TRIMESTER 3/8 (MISCELLANEOUS) ×2 IMPLANT
NS IRRIG 1000ML POUR BTL (IV SOLUTION) ×2 IMPLANT
PACK VAGINAL MINOR WOMEN LF (CUSTOM PROCEDURE TRAY) ×2 IMPLANT
PAD OB MATERNITY 4.3X12.25 (PERSONAL CARE ITEMS) ×2 IMPLANT
SET BERKELEY SUCTION TUBING (SUCTIONS) ×2 IMPLANT
TOWEL GREEN STERILE FF (TOWEL DISPOSABLE) ×4 IMPLANT
UNDERPAD 30X36 HEAVY ABSORB (UNDERPADS AND DIAPERS) ×2 IMPLANT
VACURETTE 10 RIGID CVD (CANNULA) IMPLANT
VACURETTE 7MM CVD STRL WRAP (CANNULA) IMPLANT
VACURETTE 8 RIGID CVD (CANNULA) IMPLANT
VACURETTE 9 RIGID CVD (CANNULA) IMPLANT

## 2021-09-13 NOTE — Anesthesia Postprocedure Evaluation (Signed)
Anesthesia Post Note  Patient: Lindsay Ramirez  Procedure(s) Performed: DILATATION AND EVACUATION     Patient location during evaluation: PACU Anesthesia Type: General Level of consciousness: awake Pain management: pain level controlled Vital Signs Assessment: post-procedure vital signs reviewed and stable Respiratory status: spontaneous breathing Cardiovascular status: stable Postop Assessment: no apparent nausea or vomiting Anesthetic complications: no   No notable events documented.  Last Vitals:  Vitals:   09/13/21 1300 09/13/21 1315  BP: 98/70 103/69  Pulse: (!) 58 (!) 42  Resp: 15 19  Temp:  36.6 C  SpO2: 100% 100%    Last Pain:  Vitals:   09/13/21 1315  TempSrc:   PainSc: 0-No pain                 Safira Proffit

## 2021-09-13 NOTE — Anesthesia Preprocedure Evaluation (Signed)
Anesthesia Evaluation  Patient identified by MRN, date of birth, ID band Patient awake    Reviewed: Allergy & Precautions, NPO status , Patient's Chart, lab work & pertinent test results  Airway Mallampati: II  TM Distance: >3 FB     Dental   Pulmonary asthma ,    breath sounds clear to auscultation       Cardiovascular negative cardio ROS   Rhythm:Regular Rate:Normal     Neuro/Psych  Headaches, PSYCHIATRIC DISORDERS    GI/Hepatic negative GI ROS, Neg liver ROS,   Endo/Other  negative endocrine ROS  Renal/GU negative Renal ROS     Musculoskeletal   Abdominal   Peds  Hematology   Anesthesia Other Findings   Reproductive/Obstetrics                             Anesthesia Physical Anesthesia Plan  ASA: 2  Anesthesia Plan: General   Post-op Pain Management:    Induction: Intravenous  PONV Risk Score and Plan: 3 and Ondansetron, Dexamethasone and Midazolam  Airway Management Planned:   Additional Equipment:   Intra-op Plan:   Post-operative Plan: Extubation in OR  Informed Consent:     Dental advisory given  Plan Discussed with: CRNA and Anesthesiologist  Anesthesia Plan Comments:         Anesthesia Quick Evaluation

## 2021-09-13 NOTE — Transfer of Care (Signed)
Immediate Anesthesia Transfer of Care Note  Patient: Tajha L Gabrys  Procedure(s) Performed: DILATATION AND EVACUATION  Patient Location: PACU  Anesthesia Type:General  Level of Consciousness: awake  Airway & Oxygen Therapy: Patient Spontanous Breathing  Post-op Assessment: Report given to RN and Post -op Vital signs reviewed and stable  Post vital signs: Reviewed and stable  Last Vitals:  Vitals Value Taken Time  BP 103/69 09/13/21 1316  Temp 36.6 C 09/13/21 1315  Pulse 41 09/13/21 1319  Resp 16 09/13/21 1319  SpO2 100 % 09/13/21 1319  Vitals shown include unvalidated device data.  Last Pain:  Vitals:   09/13/21 1315  TempSrc:   PainSc: 0-No pain         Complications: No notable events documented.

## 2021-09-13 NOTE — Op Note (Signed)
Preop: Missed abortion Postop: Same Op: Dilation and evacuation Surgeon: Dr. Damita Dunnings Assist: None Anesthesia: LMA EBL 25  cc IVF: 500 cc  UOP: 269 cc Complications: None  Findings: Normal anteverted uterus, sounded to 9 cm, products of conception noted  Description of the procedure: Preop antibiotics of doxycycline given. Informed consent reviewed and signed. Pt given opportunity to ask questions.   Pt prepped and draped in the dorsal lithotomy fashion after LMA anesthesia found to be adequate. Timeout performed.   Open-sided speculum placed into the vagina. Cervix grasped with a single tooth tenaculum. Cervix progressively dilated to a 27 pratt.  I used a 9 mm rigid curette. Several passes done with the suction curette and the tissue was retrieved. A gentle pass was done with a 1 curette. A gritty texture noted in all areas of the uterus confirming complete evacuation. Minimal bleeding noted. Procedure completed. All instruments removed. Counts correct x2.   Products of conception sent to pathology. Accepts genetics. Rh Positive - rhogam not indicated  Pt taken to recovery room in stable condition.

## 2021-09-13 NOTE — Anesthesia Procedure Notes (Signed)
Procedure Name: LMA Insertion Date/Time: 09/13/2021 12:07 PM Performed by: Hewitt Blade, CRNA Pre-anesthesia Checklist: Patient identified, Emergency Drugs available, Suction available and Patient being monitored Oxygen Delivery Method: Circle system utilized Preoxygenation: Pre-oxygenation with 100% oxygen Induction Type: IV induction Ventilation: Mask ventilation without difficulty LMA: LMA inserted LMA Size: 4.0 Number of attempts: 1 Placement Confirmation: positive ETCO2 and breath sounds checked- equal and bilateral Tube secured with: Tape Dental Injury: Teeth and Oropharynx as per pre-operative assessment

## 2021-09-14 ENCOUNTER — Encounter (HOSPITAL_COMMUNITY): Payer: Self-pay | Admitting: Obstetrics and Gynecology

## 2021-09-16 LAB — SURGICAL PATHOLOGY

## 2021-09-17 ENCOUNTER — Ambulatory Visit: Payer: Medicaid Other | Admitting: Obstetrics and Gynecology

## 2021-09-17 ENCOUNTER — Other Ambulatory Visit: Payer: Self-pay | Admitting: Obstetrics and Gynecology

## 2021-09-17 DIAGNOSIS — J45909 Unspecified asthma, uncomplicated: Secondary | ICD-10-CM

## 2021-09-30 ENCOUNTER — Encounter: Payer: Self-pay | Admitting: Obstetrics and Gynecology

## 2021-10-01 ENCOUNTER — Other Ambulatory Visit: Payer: Self-pay | Admitting: *Deleted

## 2021-10-02 ENCOUNTER — Other Ambulatory Visit: Payer: Self-pay | Admitting: Obstetrics and Gynecology

## 2021-10-02 DIAGNOSIS — R898 Other abnormal findings in specimens from other organs, systems and tissues: Secondary | ICD-10-CM

## 2021-10-02 NOTE — Progress Notes (Signed)
Order placed for genetic counseling secondary to triploidy after miscarriage

## 2021-10-02 NOTE — Progress Notes (Signed)
Referral placed by Dr. Elgie Congo.

## 2021-12-23 ENCOUNTER — Other Ambulatory Visit (HOSPITAL_COMMUNITY)
Admission: RE | Admit: 2021-12-23 | Discharge: 2021-12-23 | Disposition: A | Discharge status: Home / Self Care | Payer: Medicaid Other | Source: Ambulatory Visit | Attending: Obstetrics and Gynecology | Admitting: Obstetrics and Gynecology

## 2021-12-23 ENCOUNTER — Ambulatory Visit: Payer: Medicaid Other | Admitting: Obstetrics and Gynecology

## 2021-12-23 ENCOUNTER — Encounter: Payer: Self-pay | Admitting: Obstetrics and Gynecology

## 2021-12-23 VITALS — BP 118/73 | HR 71 | Ht 61.0 in | Wt 199.0 lb

## 2021-12-23 DIAGNOSIS — Z01419 Encounter for gynecological examination (general) (routine) without abnormal findings: Secondary | ICD-10-CM | POA: Insufficient documentation

## 2021-12-23 MED ORDER — VITAFOL ULTRA 29-0.6-0.4-200 MG PO CAPS: 1.0000 | ORAL_CAPSULE | Freq: Every day | ORAL | 12 refills | Status: DC

## 2021-12-23 NOTE — Progress Notes (Signed)
Subjective:  ?  ? Lindsay Ramirez is a 36 y.o. female with LMP 12/07/21 and BMI 37 who is here for a comprehensive physical exam. The patient reports an inability to conceive since her miscarriage in December. Patient is sexually active without contraception seeking pregnancy with the father of her other 7 children. Patient denies pelvic pain or abnormal discharge. Patient reports a monthly period lasting 4-10 days. The father of her children is 38 and is a smoker. Patient reports last semen analysis a year ago was normal. Patient reports using ovulation predictor kits which all indicates that she is ovulating. Patient is in a hurry to conceive. Patient is without any other complaints ? ?Past Medical History:  ?Diagnosis Date  ? Anemia   ? CHRONIC  ? Asthma   ? rarely uses inhaler,  [facility]  ? Bipolar affective disorder ([place])   ? COVID-19 09/2020  ? Depression   ? PP 2013  ? Gout   ? feet - no meds  ? Headache(784.0)   ? otc med prn  ? Sickle cell trait ([place])   ? Vaginal Pap smear, abnormal   ? ?Past Surgical History:  ?Procedure Laterality Date  ? CESAREAN SECTION MULTI-GESTATIONAL N/A 09/19/2014  ? Procedure: CESAREAN SECTION MULTI-GESTATIONAL;  Surgeon: Shelly Bombard, MD;  Location: [facility];  Service: Obstetrics;  Laterality: N/A;  ? DILATION AND CURETTAGE OF UTERUS    ? DILATION AND EVACUATION N/A 11/04/2013  ? Procedure: DILATATION AND EVACUATION;  Surgeon: Alwyn Pea, MD;  Location: [facility];  Service: Gynecology;  Laterality: N/A;  ? DILATION AND EVACUATION N/A 09/13/2021  ? Procedure: DILATATION AND EVACUATION;  Surgeon: Radene Gunning, MD;  Location: [facility];  Service: Gynecology;  Laterality: N/A;  ? [place] EXTRACTION  2012  ? ?Family History  ?Problem Relation Age of Onset  ? Hypertension Mother   ? Asthma Mother   ? Heart disease Mother   ? Hyperlipidemia Mother   ? Thyroid disease Mother   ? Alcohol abuse Father   ? Cancer Maternal Grandmother 27  ?     COLON  ? Depression Maternal  Grandmother   ? Alcohol abuse Maternal Grandmother   ? Drug abuse Maternal Grandmother   ? Alcohol abuse Maternal Grandfather   ? Drug abuse Maternal Grandfather   ? Stroke Maternal Grandfather   ? Alcohol abuse Paternal Grandfather   ? Drug abuse Paternal Grandfather   ? Asthma Sister   ? Depression Sister   ? Diabetes Maternal Aunt   ? HIV Maternal Aunt   ? Asthma Sister   ? ? ? ?Social History  ? ?Socioeconomic History  ? Marital status: Single  ?  Spouse name: Not on file  ? Number of children: 3  ? Years of education: 88  ? Highest education level: Not on file  ?Occupational History  ? Occupation: STUDENT  ?  Employer: [employer]  ? 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 Smoking status: Never  ? Smokeless tobacco: Never  ?Vaping Use  ? Vaping Use: Never used  ?Substance and Sexual Activity  ? Alcohol use: No  ?  Alcohol/week: 0.0 standard drinks  ? Drug use: No  ? Sexual activity: Yes  ?  Partners: Male  ?  Birth control/protection: None  ?Other Topics Concern  ? Not on file  ?Social History Narrative  ? Not on file  ? ?Social Determinants of Health  ? ?Financial Resource Strain: Not on file  ?Food  Insecurity: Not on file  ?Transportation Needs: Not on file  ?Physical Activity: Not on file  ?Stress: Not on file  ?Social Connections: Not on file  ?Intimate Partner Violence: Not on file  ? ?Health Maintenance  ?Topic Date Due  ? COVID-19 Vaccine (1) Never done  ? Hepatitis C Screening  Never done  ? PAP SMEAR-Modifier  05/20/2021  ? INFLUENZA VACCINE  04/08/2022  ? TETANUS/TDAP  11/14/2030  ? HIV Screening  Completed  ? HPV VACCINES  Aged Out  ? ? ?  ? ?Review of Systems ?Pertinent items noted in HPI and remainder of comprehensive ROS otherwise negative.  ? ?Objective:  ?Blood pressure 118/73, pulse 71, height '5\' 1"'$  (1.549 m), weight 199 lb (90.3 kg), last menstrual period 12/07/2021. ? ? GENERAL: Well-developed, well-nourished female in no acute distress.  ?HEENT: Normocephalic, atraumatic. Sclerae  anicteric.  ?NECK: Supple. Normal thyroid.  ?LUNGS: Clear to auscultation bilaterally.  ?HEART: Regular rate and rhythm. ?BREASTS: Symmetric in size. No palpable masses or lymphadenopathy, skin changes, or nipple drainage. ?ABDOMEN: Soft, nontender, nondistended. No organomegaly. ?PELVIC: Normal external female genitalia. Vagina is pink and rugated.  Normal discharge. Normal appearing cervix. Uterus is normal in size. No adnexal mass or tenderness. Chaperone present during the pelvic exam ?EXTREMITIES: No cyanosis, clubbing, or edema, 2+ distal pulses. ?  ?  ?Assessment:  ? ? Healthy female exam.    ?  ?Plan:  ? ? Pap smear collected ?A1C collected ?Patient with normal TSH 10/2020 ?Advised patient to keep tracking ovulation and timing intercourse around it. ?Advised patient to start taking prenatal vitamins ?Patient will be contacted with abnormal results ?See After Visit Summary for Counseling Recommendations  ? ?

## 2021-12-24 LAB — HEMOGLOBIN A1C
Est. average glucose Bld gHb Est-mCnc: 111 mg/dL
Hgb A1c MFr Bld: 5.5 % (ref 4.8–5.6)

## 2021-12-25 LAB — CYTOLOGY - PAP
Comment: NEGATIVE
Diagnosis: NEGATIVE
High risk HPV: NEGATIVE

## 2022-01-03 ENCOUNTER — Ambulatory Visit (INDEPENDENT_AMBULATORY_CARE_PROVIDER_SITE_OTHER): Payer: Medicaid Other | Admitting: Emergency Medicine

## 2022-01-03 ENCOUNTER — Encounter: Payer: Self-pay | Admitting: Emergency Medicine

## 2022-01-03 VITALS — BP 117/79 | HR 72 | Wt 202.2 lb

## 2022-01-03 DIAGNOSIS — Z3201 Encounter for pregnancy test, result positive: Secondary | ICD-10-CM | POA: Diagnosis not present

## 2022-01-03 DIAGNOSIS — Z3200 Encounter for pregnancy test, result unknown: Secondary | ICD-10-CM

## 2022-01-03 LAB — POCT URINE PREGNANCY: Preg Test, Ur: POSITIVE — AB

## 2022-01-03 NOTE — Progress Notes (Signed)
Patient was assessed and managed by nursing staff during this encounter. I have reviewed the chart and agree with the documentation and plan. I have also made any necessary editorial changes. ? ?Griffin Basil, MD ?01/03/2022 12:01 PM   ?

## 2022-01-03 NOTE — Progress Notes (Signed)
Ms. Greenwell presents today for UPT. She has no unusual complaints and complains of nausea without vomiting for 2 days. ? ?LMP: 12/07/2021 ?   ?OBJECTIVE: Appears well, in no apparent distress.  ?OB History   ? ? Gravida  ?8  ? Para  ?6  ? Term  ?4  ? Preterm  ?2  ? AB  ?2  ? Living  ?7  ?  ? ? SAB  ?2  ? IAB  ?0  ? Ectopic  ?0  ? Multiple  ?1  ? Live Births  ?7  ?   ?  ?  ? ?Home UPT Result: Positive ?In-Office UPT result: Positive ?I have reviewed the patient's medical, obstetrical, social, and family histories, and medications.  ? ?ASSESSMENT: Positive pregnancy test ? ?PLAN ?Prenatal care to be completed at: [facility] ?Pt advised to try unisom and B12 for nausea ?  ?

## 2022-01-22 ENCOUNTER — Inpatient Hospital Stay (HOSPITAL_COMMUNITY): Payer: Medicaid Other

## 2022-01-22 ENCOUNTER — Inpatient Hospital Stay (HOSPITAL_COMMUNITY)
Admission: AD | Admit: 2022-01-22 | Discharge: 2022-01-23 | Disposition: A | Discharge status: Home / Self Care | Payer: Medicaid Other | Attending: Obstetrics & Gynecology | Admitting: Obstetrics & Gynecology

## 2022-01-22 DIAGNOSIS — Z3A01 Less than 8 weeks gestation of pregnancy: Secondary | ICD-10-CM | POA: Insufficient documentation

## 2022-01-22 DIAGNOSIS — Z6790 Unspecified blood type, Rh positive: Secondary | ICD-10-CM | POA: Diagnosis not present

## 2022-01-22 DIAGNOSIS — O26891 Other specified pregnancy related conditions, first trimester: Secondary | ICD-10-CM | POA: Diagnosis not present

## 2022-01-22 DIAGNOSIS — O209 Hemorrhage in early pregnancy, unspecified: Secondary | ICD-10-CM | POA: Diagnosis present

## 2022-01-22 DIAGNOSIS — Z3491 Encounter for supervision of normal pregnancy, unspecified, first trimester: Secondary | ICD-10-CM | POA: Diagnosis not present

## 2022-01-22 DIAGNOSIS — R1030 Lower abdominal pain, unspecified: Secondary | ICD-10-CM | POA: Insufficient documentation

## 2022-01-22 LAB — URINALYSIS, ROUTINE W REFLEX MICROSCOPIC
Bilirubin Urine: NEGATIVE
Glucose, UA: NEGATIVE mg/dL
Hgb urine dipstick: NEGATIVE
Ketones, ur: NEGATIVE mg/dL
Nitrite: NEGATIVE
Protein, ur: NEGATIVE mg/dL
Specific Gravity, Urine: 1.009 (ref 1.005–1.030)
pH: 6 (ref 5.0–8.0)

## 2022-01-22 LAB — CBC
HCT: 32.1 % — ABNORMAL LOW (ref 36.0–46.0)
Hemoglobin: 10.7 g/dL — ABNORMAL LOW (ref 12.0–15.0)
MCH: 27.9 pg (ref 26.0–34.0)
MCHC: 33.3 g/dL (ref 30.0–36.0)
MCV: 83.6 fL (ref 80.0–100.0)
Platelets: 245 10*3/uL (ref 150–400)
RBC: 3.84 MIL/uL — ABNORMAL LOW (ref 3.87–5.11)
RDW: 13.3 % (ref 11.5–15.5)
WBC: 8.6 10*3/uL (ref 4.0–10.5)
nRBC: 0 % (ref 0.0–0.2)

## 2022-01-22 LAB — WET PREP, GENITAL
Sperm: NONE SEEN
Trich, Wet Prep: NONE SEEN
WBC, Wet Prep HPF POC: <10 (ref <10)
Yeast Wet Prep HPF POC: NONE SEEN

## 2022-01-22 NOTE — MAU Provider Note (Signed)
History     CSN: [id number]  Arrival date and time: 01/22/22 2248   Event Date/Time   First Provider Initiated Contact with Patient 01/22/22 2324      Chief Complaint  Patient presents with   Vaginal Bleeding   HPI Lindsay Ramirez is a 36 y.o. [id number] at 68w4dwho presents with vaginal bleeding. She reports she went to the bathroom and saw light pink spotting when she wiped. She has not had to wear a pad. She reports intermittent lower abdominal cramping that she rates a 6/10. [facility] She denies any vaginal discharge or irritation.  OB History     Gravida  9   Para  6   Term  4   Preterm  2   AB  2   Living  7      SAB  2   IAB  0   Ectopic  0   Multiple  1   Live Births  7           Past Medical History:  Diagnosis Date   Anemia    CHRONIC   Asthma    rarely uses inhaler,  [facility]   [place]    COVID-19 09/2020   Depression    PP 2013   Gout    feet - no meds   Headache(784.0)    otc med prn   [place]ickle cell trait ([place]    Vaginal Pap smear, abnormal     Past Surgical History:  Procedure Laterality Date   CESAREAN SECTION MULTI-GESTATIONAL N/A 09/19/2014   Procedure: CESAREAN SECTION MULTI-GESTATIONAL;  Surgeon: CShelly Bombard MD;  [facility];  Service: Obstetrics;  Laterality: N/A;   DILATION AND CURETTAGE OF UTERUS     DILATION AND EVACUATION N/A 11/04/2013   Procedure: DILATATION AND EVACUATION;  Surgeon: SAlwyn Pea MD;  [facility];  Service: Gynecology;  Laterality: N/A;   DILATION AND EVACUATION N/A 09/13/2021   Procedure: DILATATION AND EVACUATION;  Surgeon: DRadene Gunning MD;  [facility]  Service: Gynecology;  Laterality: N/A;   WISDOM TOOTH EXTRACTION  2012    Family History  Problem Relation Age of Onset   Hypertension Mother    Asthma Mother    Heart disease Mother    Hyperlipidemia Mother    Thyroid disease Mother     Alcohol abuse Father    Cancer Maternal Grandmother 386      COLON   Depression Maternal Grandmother    Alcohol abuse Maternal Grandmother    Drug abuse Maternal Grandmother    Alcohol abuse Maternal Grandfather    Drug abuse Maternal Grandfather    Stroke Maternal Grandfather    Alcohol abuse Paternal Grandfather    Drug abuse Paternal Grandfather    Asthma Sister    Depression Sister    Diabetes Maternal Aunt    HIV Maternal Aunt    Asthma Sister     Social History   Tobacco Use   Smoking status: Never   Smokeless tobacco: Never  Vaping Use   Vaping Use: Never used  Substance Use Topics   Alcohol use: No    Alcohol/week: 0.0 standard drinks   Drug use: No    Allergies: No Known Allergies  Medications Prior to Admission  Medication Sig Dispense Refill Last Dose   Prenat-Fe Poly-Methfol-FA-DHA (VITAFOL ULTRA) 29-0.6-0.4-200 MG CAPS Take 1 tablet by mouth daily. 30 capsule  12    VENTOLIN HFA 108 (90 Base) MCG/ACT inhaler INHALE 2 PUFFS INTO THE LUNGA EVERY 6 HOURS AS NEEDED FOR WHEEZING OR SHORTNESS OF BREATH 18 g 8     Review of Systems  Constitutional: Negative.  Negative for fatigue and fever.  HENT: Negative.    Respiratory: Negative.  Negative for shortness of breath.   Cardiovascular: Negative.  Negative for chest pain.  Gastrointestinal:  Positive for abdominal pain. Negative for constipation, diarrhea, nausea and vomiting.  Genitourinary:  Positive for vaginal bleeding. Negative for dysuria and vaginal discharge.  Neurological: Negative.  Negative for dizziness and headaches.  Physical Exam   Blood pressure 129/78, pulse 80, temperature 99 F (37.2 C), temperature source Oral, resp. rate 18, height '5\' 1"'$  (1.549 m), weight 95 kg, last menstrual period 12/07/2021, SpO2 100 %.  Physical Exam Vitals and nursing note reviewed.  Constitutional:      General: She is not in acute distress.    Appearance: She is well-developed.  HENT:     Head:  Normocephalic.  Eyes:     Pupils: Pupils are equal, round, and reactive to light.  Cardiovascular:     Rate and Rhythm: Normal rate and regular rhythm.     Heart sounds: Normal heart sounds.  Pulmonary:     Effort: Pulmonary effort is normal. No respiratory distress.     Breath sounds: Normal breath sounds.  Abdominal:     General: Bowel sounds are normal. There is no distension.     Palpations: Abdomen is soft.     Tenderness: There is no abdominal tenderness.  Skin:    General: Skin is warm and dry.  Neurological:     Mental Status: She is alert and oriented to person, place, and time.  Psychiatric:        Mood and Affect: Mood normal.        Behavior: Behavior normal.        Thought Content: Thought content normal.        Judgment: Judgment normal.    MAU Course  Procedures Results for orders placed or performed during the hospital encounter of 01/22/22 (from the past 24 hour(s))  CBC     Status: Abnormal   Collection Time: 01/22/22 10:58 PM  Result Value Ref Range   WBC 8.6 4.0 - 10.5 K/uL   RBC 3.84 (L) 3.87 - 5.11 MIL/uL   Hemoglobin 10.7 (L) 12.0 - 15.0 g/dL   HCT 32.1 (L) 36.0 - 46.0 %   MCV 83.6 80.0 - 100.0 fL   MCH 27.9 26.0 - 34.0 pg   MCHC 33.3 30.0 - 36.0 g/dL   RDW 13.3 11.5 - 15.5 %   Platelets 245 150 - 400 K/uL   nRBC 0.0 0.0 - 0.2 %  hCG, quantitative, pregnancy     Status: Abnormal   Collection Time: 01/22/22 10:58 PM  Result Value Ref Range   hCG, Beta Chain, Quant, S 21,263 (H) <5 mIU/mL  Urinalysis, Routine w reflex microscopic     Status: Abnormal   Collection Time: 01/22/22 11:10 PM  Result Value Ref Range   Color, Urine YELLOW YELLOW   APPearance CLEAR CLEAR   Specific Gravity, Urine 1.009 1.005 - 1.030   pH 6.0 5.0 - 8.0   Glucose, UA NEGATIVE NEGATIVE mg/dL   Hgb urine dipstick NEGATIVE NEGATIVE   Bilirubin Urine NEGATIVE NEGATIVE   Ketones, ur NEGATIVE NEGATIVE mg/dL   Protein, ur NEGATIVE NEGATIVE mg/dL   Nitrite NEGATIVE NEGATIVE  Leukocytes,Ua TRACE (A) NEGATIVE   RBC / HPF 0-5 0 - 5 RBC/hpf   WBC, UA 0-5 0 - 5 WBC/hpf   Bacteria, UA RARE (A) NONE SEEN   Squamous Epithelial / LPF 0-5 0 - 5  Wet prep, genital     Status: Abnormal   Collection Time: 01/22/22 11:44 PM  Result Value Ref Range   Yeast Wet Prep HPF POC NONE SEEN NONE SEEN   Trich, Wet Prep NONE SEEN NONE SEEN   Clue Cells Wet Prep HPF POC PRESENT (A) NONE SEEN   WBC, Wet Prep HPF POC <10 <10   Sperm NONE SEEN     US OB LESS THAN 14 WEEKS WITH OB TRANSVAGINAL  Result Date: 01/23/2022 CLINICAL DATA:  Abdominal cramping and vaginal bleeding. EXAM: OBSTETRIC <14 WK [place] AND TRANSVAGINAL OB US DOPPLER ULTRASOUND OF OVARIES TECHNIQUE: Both transabdominal and transvaginal ultrasound examinations were performed for complete evaluation of the gestation as well as the maternal uterus, adnexal regions, and pelvic cul-de-sac. Transvaginal technique was performed to assess early pregnancy. Color Doppler ultrasound was utilized to evaluate blood flow to the ovaries. COMPARISON:  None Available for this pregnancy. FINDINGS: Intrauterine gestational sac: Single Yolk sac:  Visualized. Embryo:  Visualized. Cardiac Activity: Visualized. Heart Rate: 114 bpm MSD: Embryo detected, not measured. CRL:   2.3 mm   5 w 5 d +/-3 days; [place] EDC: 09/19/2022 Subchorionic hemorrhage:  None visualized. Maternal uterus/adnexae: The uterus is anteverted. There is a heterogeneous mostly hypoechoic 1.5 cm intramural fibroid of the dorsal body of the uterus. No other focal wall mass is seen. The cervix is closed, unremarkable, and measures 3.9 cm in length Both ovaries are visible and sonographically unremarkable and normal in size. There is scattered color Doppler flow to both ovaries. No free fluid or adnexal mass is seen. IMPRESSION: 1. Living single IUP at 5 weeks 5 days +/-3 days, [facility] 09/19/2022. 2. No visible subchorionic bleed and no cervical funneling or shortening. 3. 1.5 cm dorsal intramural  fibroid of the body of the uterus without other appreciable wall abnormality. 4. Anatomic fetal survey suggested 18-20 weeks' gestation. Electronically Signed   By: Telford Nab M.D.   On: 01/23/2022 00:21     MDM UA, UPT CBC, HCG ABO/Rh- O Pos Wet prep and gc/chlamydia US OB Comp Less 14 weeks with Transvaginal   Assessment and Plan   1. Normal intrauterine pregnancy on prenatal ultrasound in first trimester   2. [redacted] weeks gestation of pregnancy    -Discharge home in stable condition -First trimester precautions discussed -Patient advised to follow-up with OB as [facility] at [facility] for [facility] care -Patient may return to MAU as needed or if her condition were to change or worsen   [place] 01/22/2022, 11:24 PM

## 2022-01-22 NOTE — MAU Note (Signed)
.  Lindsay Ramirez is a 36 y.o. at 64w4dhere in MAU reporting: light pink VB  with wiping since 1900 tonight. Pt reports lower ABD intermittent cramping that started shortly prior to the VB, causing the patient to go to the bathroom and noticed the VB. Pt denies LOF, abnormal discharge, and recent intercourse in the last 48 hours. Pt took a urine preg test [facility].  ? ?Onset of complaint: 1900 ?Pain score: 6/10 ABD ?Vitals:  ? 01/22/22 2317  ?BP: 129/78  ?Pulse: 80  ?Resp: 18  ?Temp: 99 ?F (37.2 ?C)  ?SpO2: 100%  ?   ? ?Lab orders placed from triage:   UA ?

## 2022-01-23 ENCOUNTER — Other Ambulatory Visit: Payer: Self-pay

## 2022-01-23 ENCOUNTER — Inpatient Hospital Stay (EMERGENCY_DEPARTMENT_HOSPITAL)
Admission: AD | Admit: 2022-01-23 | Discharge: 2022-01-23 | Disposition: A | Discharge status: Home / Self Care | Payer: Medicaid Other | Source: Home / Self Care | Attending: Obstetrics & Gynecology | Admitting: Obstetrics & Gynecology

## 2022-01-23 ENCOUNTER — Telehealth: Payer: Self-pay | Admitting: *Deleted

## 2022-01-23 DIAGNOSIS — O209 Hemorrhage in early pregnancy, unspecified: Secondary | ICD-10-CM | POA: Diagnosis not present

## 2022-01-23 DIAGNOSIS — Z6790 Unspecified blood type, Rh positive: Secondary | ICD-10-CM | POA: Insufficient documentation

## 2022-01-23 DIAGNOSIS — O26851 Spotting complicating pregnancy, first trimester: Secondary | ICD-10-CM | POA: Insufficient documentation

## 2022-01-23 DIAGNOSIS — Z3491 Encounter for supervision of normal pregnancy, unspecified, first trimester: Secondary | ICD-10-CM | POA: Diagnosis not present

## 2022-01-23 DIAGNOSIS — Z3A01 Less than 8 weeks gestation of pregnancy: Secondary | ICD-10-CM

## 2022-01-23 LAB — HCG, QUANTITATIVE, PREGNANCY: hCG, Beta Chain, Quant, S: 21263 m[IU]/mL — ABNORMAL HIGH (ref <5)

## 2022-01-23 NOTE — Discharge Instructions (Signed)

## 2022-01-23 NOTE — MAU Provider Note (Signed)
Event Date/Time   First Provider Initiated Contact with Patient 01/23/22 1815      S [name] is a 36 y.o. [id number] patient who presents to MAU today with complaint of increased vaginal bleeding. Patient was seen about midnight last night for vaginal bleeding and found to have a viable IUP on [place]. Patient called the office as she was experiencing increased bleeding and was told to come to MAU for evaluation. Patient states that when she came in last night she was only having spotting after wiping when using the restroom, but now she is having more bleeding when wiping than before, but it is still pink as before. Patient states she is wearing a pad, but she is not experiencing any blood on the pad. Patient is concerned that this is a sign of a miscarriage.  O BP (!) 114/59 (BP Location: Right Arm)   Pulse 68   Temp 99.1 F (37.3 C) (Oral)   Resp 16   LMP 12/07/2021   SpO2 100%   Patient Vitals for the past 24 hrs:  BP Temp Temp src Pulse Resp SpO2  01/23/22 1756 (!) 114/59 99.1 F (37.3 C) Oral 68 16 100 %   Physical Exam Vitals and nursing note reviewed.  Constitutional:      General: She is not in acute distress.    Appearance: Normal appearance. She is not ill-appearing, toxic-appearing or diaphoretic.  HENT:     Head: Normocephalic and atraumatic.  Pulmonary:     Effort: Pulmonary effort is normal.  Neurological:     Mental Status: She is alert and oriented to person, place, and time.  Psychiatric:        Mood and Affect: Mood normal.        Behavior: Behavior normal.        Thought Content: Thought content normal.        Judgment: Judgment normal.   A Medical screening exam complete Vaginal bleeding RH positive  P Discharge from MAU in stable condition Dating not performed by measuring fetal pole on [place] last night, messaged [facility] office to schedule for dating [place] in 2 weeks Discussed possible causes of bleeding in early pregnancy, s/sx of SAB and return  MAU precautions Warning signs for worsening condition that would warrant emergency follow-up discussed Patient may return to MAU as needed   Dahlia Nifong, Gerrie Nordmann, NP 01/23/2022 6:45 PM

## 2022-01-23 NOTE — MAU Note (Signed)
Lindsay Ramirez is a 36 y.o. at 71w5dhere in MAU reporting: having bleeding and cramps.  Was here yesterday for bleeding, it has gotten worse. Still pink and mainly when she wipes.  Mild cramping Onset of complaint: worse today Pain score: mild Vitals:   01/23/22 1756  BP: (!) 114/59  Pulse: 68  Resp: 16  Temp: 99.1 F (37.3 C)  SpO2: 100%      Lab orders placed from triage:  none

## 2022-01-23 NOTE — Telephone Encounter (Signed)
TC from pt. Reports she cont to have VB, more than when she went in to MAU 5/17. Reports bleeding is not like a period but that it is more than before and reports increased abdomen and "side" pain. Reports recent SAB and is anxious about another loss. Pt seeking appt in office today. Advised that we are unable to accommodate for same day appt and in the setting of VB in early pregnancy we general advise pts to seek care in MAU.  Pt verbalized understanding and plans to return to MAU.

## 2022-01-24 ENCOUNTER — Other Ambulatory Visit: Payer: Self-pay | Admitting: Emergency Medicine

## 2022-01-24 DIAGNOSIS — O209 Hemorrhage in early pregnancy, unspecified: Secondary | ICD-10-CM

## 2022-01-24 LAB — GC/CHLAMYDIA PROBE AMP (~~LOC~~) NOT AT ARMC
Chlamydia: NEGATIVE
Comment: NEGATIVE
Comment: NORMAL
Neisseria Gonorrhea: NEGATIVE

## 2022-02-06 ENCOUNTER — Other Ambulatory Visit: Payer: Self-pay | Admitting: Obstetrics and Gynecology

## 2022-02-06 ENCOUNTER — Ambulatory Visit
Admission: RE | Admit: 2022-02-06 | Discharge: 2022-02-06 | Disposition: A | Discharge status: Home / Self Care | Payer: Medicaid Other | Source: Ambulatory Visit | Attending: Obstetrics and Gynecology | Admitting: Obstetrics and Gynecology

## 2022-02-06 DIAGNOSIS — O209 Hemorrhage in early pregnancy, unspecified: Secondary | ICD-10-CM

## 2022-02-24 ENCOUNTER — Ambulatory Visit (INDEPENDENT_AMBULATORY_CARE_PROVIDER_SITE_OTHER): Payer: Medicaid Other

## 2022-02-24 ENCOUNTER — Ambulatory Visit (INDEPENDENT_AMBULATORY_CARE_PROVIDER_SITE_OTHER): Payer: Medicaid Other | Admitting: Family Medicine

## 2022-02-24 ENCOUNTER — Other Ambulatory Visit (HOSPITAL_COMMUNITY)
Admission: RE | Admit: 2022-02-24 | Discharge: 2022-02-24 | Disposition: A | Discharge status: Home / Self Care | Payer: Medicaid Other | Source: Ambulatory Visit | Attending: Family Medicine | Admitting: Family Medicine

## 2022-02-24 ENCOUNTER — Encounter (HOSPITAL_COMMUNITY): Payer: Self-pay | Admitting: Obstetrics and Gynecology

## 2022-02-24 ENCOUNTER — Ambulatory Visit (INDEPENDENT_AMBULATORY_CARE_PROVIDER_SITE_OTHER): Payer: Medicaid Other | Admitting: Licensed Clinical Social Worker

## 2022-02-24 ENCOUNTER — Telehealth: Payer: Self-pay

## 2022-02-24 ENCOUNTER — Encounter: Payer: Self-pay | Admitting: Family Medicine

## 2022-02-24 VITALS — BP 116/80 | HR 63 | Wt 208.6 lb

## 2022-02-24 DIAGNOSIS — Z3480 Encounter for supervision of other normal pregnancy, unspecified trimester: Secondary | ICD-10-CM | POA: Diagnosis present

## 2022-02-24 DIAGNOSIS — F439 Reaction to severe stress, unspecified: Secondary | ICD-10-CM

## 2022-02-24 DIAGNOSIS — Z862 Personal history of diseases of the blood and blood-forming organs and certain disorders involving the immune mechanism: Secondary | ICD-10-CM

## 2022-02-24 DIAGNOSIS — F419 Anxiety disorder, unspecified: Secondary | ICD-10-CM

## 2022-02-24 DIAGNOSIS — O039 Complete or unspecified spontaneous abortion without complication: Secondary | ICD-10-CM | POA: Diagnosis not present

## 2022-02-24 DIAGNOSIS — O021 Missed abortion: Secondary | ICD-10-CM

## 2022-02-24 DIAGNOSIS — F4321 Adjustment disorder with depressed mood: Secondary | ICD-10-CM | POA: Diagnosis not present

## 2022-02-24 DIAGNOSIS — O368390 Maternal care for abnormalities of the fetal heart rate or rhythm, unspecified trimester, not applicable or unspecified: Secondary | ICD-10-CM

## 2022-02-24 DIAGNOSIS — F411 Generalized anxiety disorder: Secondary | ICD-10-CM

## 2022-02-24 DIAGNOSIS — Z591 Inadequate housing, unspecified: Secondary | ICD-10-CM | POA: Diagnosis not present

## 2022-02-24 DIAGNOSIS — Z98891 History of uterine scar from previous surgery: Secondary | ICD-10-CM

## 2022-02-24 MED ORDER — CVS PRENATAL GUMMY 0.4 MG PO CHEW
1.0000 | CHEWABLE_TABLET | Freq: Every day | ORAL | 11 refills | Status: DC
Start: 2022-02-24 — End: 2022-02-24

## 2022-02-24 MED ORDER — GOJJI WEIGHT SCALE MISC: 1.0000 | 0 refills | Status: DC

## 2022-02-24 MED ORDER — BLOOD PRESSURE KIT DEVI
1.0000 | Freq: Every day | 0 refills | Status: DC
Start: 2022-02-24 — End: 2022-02-24

## 2022-02-24 NOTE — Progress Notes (Signed)
Opened in error

## 2022-02-24 NOTE — Progress Notes (Signed)
[id number]  GAD-21 Hx of anxiety, depression BPD

## 2022-02-24 NOTE — H&P (Signed)
Faculty Practice Obstetrics and Gynecology Attending History and Physical  Lindsay Ramirez is a 36 y.o. [phone] who presented to the OR today for D&E due to loss at 7w by [place] but 11w by [place]. She most recently had an SAB in January and genetics showed triploidy of maternal origin at the time.  She is not having any bleeding or abdominal pain. She preferred D&E for management of the miscarriage.  Denies any abnormal vaginal discharge, fevers, chills, sweats, dysuria, nausea, vomiting, other GI or GU symptoms or other general symptoms.  Past Medical History:  Diagnosis Date   Anemia    CHRONIC   Asthma    rarely uses inhaler,  [facility]   Bipolar affective disorder ([place])    COVID-19 09/2020   Depression    PP 2013   Gout    feet - no meds   Headache(784.0)    otc med prn   Sickle cell trait ([place])    Vaginal Pap smear, abnormal    Past Surgical History:  Procedure Laterality Date   CESAREAN SECTION MULTI-GESTATIONAL N/A 09/19/2014   Procedure: CESAREAN SECTION MULTI-GESTATIONAL;  Surgeon: Shelly Bombard, MD;  Location: [facility];  Service: Obstetrics;  Laterality: N/A;   DILATION AND CURETTAGE OF UTERUS     DILATION AND EVACUATION N/A 11/04/2013   Procedure: DILATATION AND EVACUATION;  Surgeon: Alwyn Pea, MD;  Location: [facility];  Service: Gynecology;  Laterality: N/A;   DILATION AND EVACUATION N/A 09/13/2021   Procedure: DILATATION AND EVACUATION;  Surgeon: Radene Gunning, MD;  Location: [facility];  Service: Gynecology;  Laterality: N/A;   WISDOM TOOTH EXTRACTION  2012   OB History  Gravida Para Term Preterm AB Living  9 6 4 2 2 7   SAB IAB Ectopic Multiple Live Births  2 0 0 1 7    # Outcome Date GA Lbr Len/2nd Weight Sex Delivery Anes PTL Lv  9 Current           8 SAB 08/30/21 [redacted]w[redacted]d        7 Term 12/12/18 331w5d9:49 / 00:21 3635 g M Vag-Spont EPI  LIV  6A Preterm 09/19/14 368w1d290 g F CS-LTranv Spinal  LIV  6B Preterm 09/19/14 36w80w1d50 g M CS-LTranv Spinal  LIV  5  SAB 2015          4 Preterm 12/12/12 36w6102w6d8 / 00:06 2605 g M Vag-Spont EPI  LIV     Birth Comments: none  3 Term 02/14/12 61w0d5w0d / 00:18 3120 g M Vag-Spont EPI  LIV     Birth Comments: WNL  2 Term 03/2011 [redacted]w[redacted]d 56w0d3260 g F Vag-Spont EPI  LIV  1 Term 07/2006 [redacted]w[redacted]d 038w4d345 g F Vag-Spont EPI  LIV  Patient denies any other pertinent gynecologic issues.  No current facility-administered medications on file prior to encounter.   Current Outpatient Medications on File Prior to Encounter  Medication Sig Dispense Refill   Blood Pressure Monitoring (BLOOD PRESSURE KIT) DEVI 1 Device by Does not apply route daily. 1 each 0   Misc. Devices (GOJJI WEIGHT SCALE) MISC 1 Device by Does not apply route once a week. 1 each 0   Prenat-Fe Poly-Methfol-FA-DHA (VITAFOL ULTRA) 29-0.6-0.4-200 MG CAPS Take 1 tablet by mouth daily. 30 capsule 12   Prenatal Multivit-Min-FA ([employer] PRENATAL GUMMY) 0.4 MG CHEW Chew 1 tablet by mouth daily. 30 tablet 11   Prenatal MV & Min w/FA-DHA ([employer] PRENATAL GUMMY) 0.18-25 MG  CHEW Chew by mouth.     VENTOLIN HFA 108 (90 Base) MCG/ACT inhaler INHALE 2 PUFFS INTO THE LUNGA EVERY 6 HOURS AS NEEDED FOR WHEEZING OR SHORTNESS OF BREATH 18 g 8   No Known Allergies  Social History:   reports that she has never smoked. She has never used smokeless tobacco. She reports that she does not drink alcohol and does not use drugs. Family History  Problem Relation Age of Onset   Hypertension Mother    Asthma Mother    Heart disease Mother    Hyperlipidemia Mother    Thyroid disease Mother    Alcohol abuse Father    Cancer Maternal Grandmother 49       COLON   Depression Maternal Grandmother    Alcohol abuse Maternal Grandmother    Drug abuse Maternal Grandmother    Alcohol abuse Maternal Grandfather    Drug abuse Maternal Grandfather    Stroke Maternal Grandfather    Alcohol abuse Paternal Grandfather    Drug abuse Paternal Grandfather    Asthma Sister    Depression Sister     Diabetes Maternal Aunt    HIV Maternal Aunt    Asthma Sister     Review of Systems: Pertinent items noted in HPI and remainder of comprehensive ROS otherwise negative.  PHYSICAL EXAM: Last menstrual period 12/07/2021. CONSTITUTIONAL: Well-developed, well-nourished female in no acute distress.  HENT:  Normocephalic, atraumatic, External right and left ear normal. Oropharynx is clear and moist EYES: Conjunctivae and EOM are normal. Pupils are equal, round, and reactive to light. No scleral icterus.  NECK: Normal range of motion, supple, no masses SKIN: Skin is warm and dry. No rash noted. Not diaphoretic. No erythema. No pallor. NEUROLOGIC: Alert and oriented to person, place, and time. Normal reflexes, muscle tone coordination. No cranial nerve deficit noted. PSYCHIATRIC: Normal mood and affect. Normal behavior. Normal judgment and thought content. CARDIOVASCULAR: Normal heart rate noted, regular rhythm RESPIRATORY: Effort and breath sounds normal, no problems with respiration noted ABDOMEN: Soft, nontender, nondistended. PELVIC: Deferred  MUSCULOSKELETAL: Normal range of motion. No tenderness.  No cyanosis, clubbing, or edema.  2+ distal pulses.  Labs: No results found for this or any previous visit (from the past 336 hour(s)).  Imaging Studies: US OB Transvaginal  Result Date: 02/06/2022 CLINICAL DATA:  Vaginal bleeding in 1st trimester pregnancy. EXAM: TRANSVAGINAL OB ULTRASOUND TECHNIQUE: Transvaginal ultrasound was performed for complete evaluation of the gestation as well as the maternal uterus, adnexal regions, and pelvic cul-de-sac. COMPARISON:  01/22/2022 FINDINGS: Intrauterine gestational sac: Single Yolk sac:  Visualized. Embryo:  Visualized. Cardiac Activity: Visualized. Heart Rate: 121 bpm CRL:   15 mm   7 w 6 d                  [place] EDC: 09/19/2022 Subchorionic hemorrhage:  None visualized. Maternal uterus/adnexae: A small posterior uterine fibroid is again seen, measuring  approximately 2 cm. Both ovaries are normal in appearance. No adnexal mass or abnormal free fluid identified. IMPRESSION: Single living intrauterine gestation with assigned gestational age of [redacted] weeks 6 days by prior ultrasound. Appropriate interval growth. 2 cm posterior uterine fibroid. Electronically Signed   By: Marlaine Hind M.D.   On: 02/06/2022 11:48    Reviewed [place] images myself from 6/19 when FHT not found in the office with doppler. Formal US showed CRL measuring 34w5dand CRL 184mand no FHT which is definitve for SAB. Final report pending at this time.   Assessment:  Active Problems:   Miscarriage   Plan: - I recommend genetics due to second loss and abnormal genetics at the time of January 2023 SAB.  - Risks of surgery include but are not limited to: bleeding, infection, injury to surrounding organs/tissues (i.e. bowel/bladder/ureters), need for additional procedures, wound complications, hospital re-admission, and uterine perforation. - She is rh positive.    Radene Gunning, MD, [place] for [facility], [place]

## 2022-02-24 NOTE — Progress Notes (Signed)
PCP - none Cardiologist - n/a  Chest x-ray - n/a EKG - n/a Stress Test - n/a ECHO - n/a Cardiac Cath - n/a  ICD Pacemaker/Loop - n/a  Sleep Study -  n/a CPAP - none  ERAS: Clear liquids til 9:30 AM DOS  STOP now taking any Aspirin (unless otherwise instructed by your surgeon), Aleve, Naproxen, Ibuprofen, Motrin, Advil, Goody's, BC's, all herbal medications, fish oil, and all vitamins.   Coronavirus Screening Do you have any of the following symptoms:  Cough yes/no: No Fever (>100.7F)  yes/no: No Runny nose yes/no: No Sore throat yes/no: No Difficulty breathing/shortness of breath  yes/no: No  Have you traveled in the last 14 days and where? yes/no: No  Patient verbalized understanding of instructions that were given via phone.

## 2022-02-24 NOTE — Telephone Encounter (Signed)
Called patient, no answer, left voicemail with surgery date, time, location and preop instructions. 

## 2022-02-24 NOTE — Progress Notes (Signed)
GYNECOLOGY OFFICE VISIT NOTE  History:  36 y.o. [id number] here today for Initial OB however no heart tones on doppler and found to have a non-viable pregnancy on TVUS in office. She denies any abnormal vaginal discharge, bleeding, pelvic pain or other concerns.   Of note, patient went to [facility]U with vaginal bleeding on 5/18 At that time found to have viable IUP at 73w6dwith HR at 121 BPM Since then her bleeding stopped (the day after MAU visit) Patient with hx of prior SAB in 09/2021 (received cytotec x2 which did not lead to release of pregnancy tissue and she ultimately had a D&C)   The following portions of the patient's history were reviewed and updated as appropriate: allergies, current medications, past family history, past medical history, past social history, past surgical history and problem list.    Review of Systems:  Pertinent items noted in HPI Review of Systems  Constitutional:  Negative for chills and fever.  Gastrointestinal:  Negative for abdominal pain.  Genitourinary:  Negative for dysuria and urgency.  Musculoskeletal:  Negative for back pain.  Neurological:  Negative for dizziness and headaches.  All other systems reviewed and are negative.   Objective:  Physical Exam BP 116/80   Pulse 63   Wt 208 lb 9.6 oz (94.6 kg)   LMP 12/07/2021   BMI 39.41 kg/m  Physical Exam Vitals and nursing note reviewed. Exam conducted with a chaperone present.  HENT:     Head: Normocephalic.  Cardiovascular:     Rate and Rhythm: Normal rate.     Pulses: Normal pulses.  Pulmonary:     Effort: Pulmonary effort is normal.  Abdominal:     General: Abdomen is flat. There is no distension.     Tenderness: There is no abdominal tenderness.  Musculoskeletal:        General: Normal range of motion.  Skin:    General: Skin is warm.     Capillary Refill: Capillary refill takes less than 2 seconds.  Neurological:     General: No focal deficit present.     Mental Status: She is  alert and oriented to person, place, and time.     Labs and Imaging No results found for this or any previous visit (from the past 168 hour(s)). [place] Limited  Result Date: 02/24/2022 ----------------------------------------------------------------------  OBSTETRICS REPORT                        (Signed Final 02/24/2022 01:09 pm) ---------------------------------------------------------------------- Patient Info  ID #:       [id number]                         D.O.B.:  [birth year](35 yrs)  Name:       SMEIYA WISLERRICHMOND               Visit Date: 02/24/2022 11:15 am ---------------------------------------------------------------------- Performed By  Attending:        LLynnda ShieldsMD       Referred By:       ARenard MatterMD  Performed By:     AElyn PeersRN         [facility]                                                              [place] ---------------------------------------------------------------------- Orders  #  Description                           Code        Ordered By  1  US OB LIMITED                         [id number]     Vivan Vanderveer ----------------------------------------------------------------------  #  Order #                     Accession #                Episode #  1  [id number]                   [id number]                 [id number] ---------------------------------------------------------------------- Indications  Less than [redacted] weeks gestation of pregnancy        Z3A.01 ---------------------------------------------------------------------- Fetal Evaluation  Num Of Fetuses:          1  Cardiac Activity:        Not visualized  Comment:    No fetal heartbeat noted ---------------------------------------------------------------------- Biometry  GS:       32.1  mm     G. Age:  9w 0d                   EDD:    09/29/22  CRL:      13.1  mm     G.  Age:  7w 4d                   EDD:    10/09/22 ---------------------------------------------------------------------- Gestational Age  Best:          7w 4d      Det. By:  U/S C R L (02/24/22)     EDD:   10/09/22 ---------------------------------------------------------------------- Comments  Non viable IUP at 25w4dby [place] Measurements are  consistent with previous scan on 6/1 which showed a viable  pregnancy with FHR 121. ----------------------------------------------------------------------                 LLynnda Shields MD Electronically Signed Final Report   02/24/2022 01:09 pm ----------------------------------------------------------------------  [place] Transvaginal  Result Date: 02/06/2022 CLINICAL DATA:  Vaginal bleeding in 1st trimester pregnancy. EXAM: TRANSVAGINAL OB ULTRASOUND TECHNIQUE: Transvaginal ultrasound was performed for complete evaluation of the gestation as well as the maternal uterus, adnexal regions, and pelvic cul-de-sac. COMPARISON:  01/22/2022 FINDINGS: Intrauterine gestational sac: Single Yolk sac:  Visualized. Embryo:  Visualized. Cardiac Activity: Visualized. Heart Rate: 121 bpm CRL:   15 mm  7 w 6 d                  [place] EDC: 09/19/2022 Subchorionic hemorrhage:  None visualized. Maternal uterus/adnexae: A small posterior uterine fibroid is again seen, measuring approximately 2 cm. Both ovaries are normal in appearance. No adnexal mass or abnormal free fluid identified. IMPRESSION: Single living intrauterine gestation with assigned gestational age of [redacted] weeks 6 days by prior ultrasound. Appropriate interval growth. 2 cm posterior uterine fibroid. Electronically Signed   By: Marlaine Hind M.D.   On: 02/06/2022 11:48    Assessment & Plan:   1. Unable to hear fetal heart tones as reason for ultrasound scan 2. Spontaneous abortion/Missed abortion Patient presented for initial OB visit and unable to get heart tones on doppler. Abdominal US done as well as TVUS and showed fetus at ~[redacted]weeks  gestation without heart beat. Previously US was done on 6/1 which showed 7 week6d fetus with HR. Therefore this current [place] is diagnostic for non-viable pregnancy. Additionally patient currently not having any bleeding or cramping therefore likely missed abortion/miscarriage.  - Discussed options: expectant management, mife/misoprostol and D&C. Discussed the risks and benefits to each.   - Patient had misoprostol/cytotec with her last SAB in January and it was not successful so this time would like to opt for [facility] - We discussed genetics: we reviewed the benefits - she is considering getting genetics done. - Risks of surgery briefly discussed and discussed with patient that the surgeon performing the procedure would further discuss with her.   - message sent to [place] to schedule procedure  2. Hx of anxiety 3. SAB Patient would like to meet with [facility] provider. Referral placed. 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 Last time genetics was done and showed triploidy. Patient considering having genetics done during [facility] procedure. Patient unsure if she would like any future pregnancies but if she decides she desires future pregnancy could over recurrent pregnancy loss workup after resolution of current SAB. Discussed this with patient and she expresses understanding   Total face-to-face time with patient: 30 minutes.  Over 50% of encounter was spent on counseling and coordination of care.  Renard Matter, MD, MPH OB Fellow, [place] for [facility], [place]

## 2022-02-25 ENCOUNTER — Encounter (HOSPITAL_COMMUNITY): Payer: Self-pay | Admitting: Obstetrics and Gynecology

## 2022-02-25 ENCOUNTER — Ambulatory Visit (HOSPITAL_COMMUNITY): Payer: Medicaid Other | Admitting: Certified Registered Nurse Anesthetist

## 2022-02-25 ENCOUNTER — Ambulatory Visit (HOSPITAL_BASED_OUTPATIENT_CLINIC_OR_DEPARTMENT_OTHER): Payer: Medicaid Other | Admitting: Certified Registered Nurse Anesthetist

## 2022-02-25 ENCOUNTER — Encounter (HOSPITAL_COMMUNITY)
Admission: RE | Disposition: A | Discharge status: Home / Self Care | Payer: Self-pay | Source: Home / Self Care | Attending: Obstetrics and Gynecology

## 2022-02-25 ENCOUNTER — Ambulatory Visit (HOSPITAL_COMMUNITY)
Admission: RE | Admit: 2022-02-25 | Discharge: 2022-02-25 | Disposition: A | Discharge status: Home / Self Care | Payer: Medicaid Other | Attending: Obstetrics and Gynecology | Admitting: Obstetrics and Gynecology

## 2022-02-25 ENCOUNTER — Other Ambulatory Visit: Payer: Self-pay

## 2022-02-25 DIAGNOSIS — J45909 Unspecified asthma, uncomplicated: Secondary | ICD-10-CM | POA: Diagnosis not present

## 2022-02-25 DIAGNOSIS — O021 Missed abortion: Secondary | ICD-10-CM

## 2022-02-25 DIAGNOSIS — O039 Complete or unspecified spontaneous abortion without complication: Secondary | ICD-10-CM | POA: Diagnosis not present

## 2022-02-25 HISTORY — DX: Anxiety disorder, unspecified: F41.9

## 2022-02-25 HISTORY — PX: DILATION AND EVACUATION: SHX1459

## 2022-02-25 LAB — CBC
HCT: 36.5 % (ref 36.0–46.0)
Hemoglobin: 12.1 g/dL (ref 12.0–15.0)
MCH: 28.7 pg (ref 26.0–34.0)
MCHC: 33.2 g/dL (ref 30.0–36.0)
MCV: 86.5 fL (ref 80.0–100.0)
Platelets: 211 10*3/uL (ref 150–400)
RBC: 4.22 MIL/uL (ref 3.87–5.11)
RDW: 13.1 % (ref 11.5–15.5)
WBC: 4.7 10*3/uL (ref 4.0–10.5)
nRBC: 0 % (ref 0.0–0.2)

## 2022-02-25 LAB — CERVICOVAGINAL ANCILLARY ONLY
Bacterial Vaginitis (gardnerella): POSITIVE — AB
Candida Glabrata: NEGATIVE
Candida Vaginitis: POSITIVE — AB
Chlamydia: NEGATIVE
Comment: NEGATIVE
Comment: NEGATIVE
Comment: NEGATIVE
Comment: NEGATIVE
Comment: NEGATIVE
Comment: NORMAL
Neisseria Gonorrhea: NEGATIVE
Trichomonas: NEGATIVE

## 2022-02-25 LAB — TYPE AND SCREEN
ABO/RH(D): O POS
Antibody Screen: NEGATIVE

## 2022-02-25 SURGERY — DILATION AND EVACUATION, UTERUS
Anesthesia: General

## 2022-02-25 MED ORDER — PROPOFOL 10 MG/ML IV BOLUS
INTRAVENOUS | Status: DC | PRN
  Administered 2022-02-25: 200 mg via INTRAVENOUS

## 2022-02-25 MED ORDER — LIDOCAINE 2% (20 MG/ML) 5 ML SYRINGE
INTRAMUSCULAR | Status: DC | PRN
  Administered 2022-02-25: 60 mg via INTRAVENOUS

## 2022-02-25 MED ORDER — LACTATED RINGERS IV SOLN: INTRAVENOUS | Status: DC

## 2022-02-25 MED ORDER — IBUPROFEN 800 MG PO TABS
800.0000 mg | ORAL_TABLET | Freq: Three times a day (TID) | ORAL | 0 refills | Status: DC | PRN
Start: 2022-02-25 — End: 2022-08-05

## 2022-02-25 MED ORDER — ORAL CARE MOUTH RINSE: 15.0000 mL | Freq: Once | OROMUCOSAL | Status: AC

## 2022-02-25 MED ORDER — MIDAZOLAM HCL 5 MG/5ML IJ SOLN
INTRAMUSCULAR | Status: DC | PRN
  Administered 2022-02-25: 2 mg via INTRAVENOUS

## 2022-02-25 MED ORDER — FENTANYL CITRATE (PF) 250 MCG/5ML IJ SOLN
INTRAMUSCULAR | Status: AC
  Filled 2022-02-25: qty 5

## 2022-02-25 MED ORDER — DEXAMETHASONE SODIUM PHOSPHATE 10 MG/ML IJ SOLN
INTRAMUSCULAR | Status: DC | PRN
  Administered 2022-02-25: 5 mg via INTRAVENOUS

## 2022-02-25 MED ORDER — KETOROLAC TROMETHAMINE 15 MG/ML IJ SOLN
15.0000 mg | INTRAMUSCULAR | Status: AC
  Administered 2022-02-25: 15 mg via INTRAVENOUS
  Filled 2022-02-25: qty 1

## 2022-02-25 MED ORDER — PHENYLEPHRINE HCL (PRESSORS) 10 MG/ML IV SOLN
INTRAVENOUS | Status: DC | PRN
  Administered 2022-02-25 (×2): 80 ug via INTRAVENOUS
  Administered 2022-02-25: 120 ug via INTRAVENOUS

## 2022-02-25 MED ORDER — OXYCODONE HCL 5 MG PO TABS: 5.0000 mg | ORAL_TABLET | Freq: Once | ORAL | Status: DC | PRN

## 2022-02-25 MED ORDER — FENTANYL CITRATE (PF) 100 MCG/2ML IJ SOLN: 25.0000 ug | INTRAMUSCULAR | Status: DC | PRN

## 2022-02-25 MED ORDER — DOXYCYCLINE HYCLATE 100 MG IV SOLR
200.0000 mg | INTRAVENOUS | Status: AC
  Administered 2022-02-25: 200 mg via INTRAVENOUS
  Filled 2022-02-25: qty 200

## 2022-02-25 MED ORDER — FENTANYL CITRATE (PF) 250 MCG/5ML IJ SOLN
INTRAMUSCULAR | Status: DC | PRN
  Administered 2022-02-25: 50 ug via INTRAVENOUS

## 2022-02-25 MED ORDER — ACETAMINOPHEN 10 MG/ML IV SOLN: 1000.0000 mg | Freq: Once | INTRAVENOUS | Status: DC | PRN

## 2022-02-25 MED ORDER — OXYCODONE HCL 5 MG/5ML PO SOLN: 5.0000 mg | Freq: Once | ORAL | Status: DC | PRN

## 2022-02-25 MED ORDER — ACETAMINOPHEN 500 MG PO TABS
1000.0000 mg | ORAL_TABLET | ORAL | Status: AC
  Administered 2022-02-25: 1000 mg via ORAL
  Filled 2022-02-25: qty 2

## 2022-02-25 MED ORDER — MIDAZOLAM HCL 2 MG/2ML IJ SOLN
INTRAMUSCULAR | Status: AC
  Filled 2022-02-25: qty 2

## 2022-02-25 MED ORDER — ACETAMINOPHEN 500 MG PO TABS: 1000.0000 mg | ORAL_TABLET | Freq: Once | ORAL | Status: DC | PRN

## 2022-02-25 MED ORDER — CHLORHEXIDINE GLUCONATE 0.12 % MT SOLN
15.0000 mL | Freq: Once | OROMUCOSAL | Status: AC
  Administered 2022-02-25: 15 mL via OROMUCOSAL
  Filled 2022-02-25: qty 15

## 2022-02-25 MED ORDER — ACETAMINOPHEN 160 MG/5ML PO SOLN: 1000.0000 mg | Freq: Once | ORAL | Status: DC | PRN

## 2022-02-25 MED ORDER — ONDANSETRON HCL 4 MG/2ML IJ SOLN
INTRAMUSCULAR | Status: DC | PRN
  Administered 2022-02-25: 4 mg via INTRAVENOUS

## 2022-02-25 MED ORDER — POVIDONE-IODINE 10 % EX SWAB
2.0000 | Freq: Once | CUTANEOUS | Status: AC
  Administered 2022-02-25: 2 via TOPICAL

## 2022-02-25 SURGICAL SUPPLY — 23 items
CATH ROBINSON RED A/P 16FR (CATHETERS) ×2 IMPLANT
FILTER UTR ASPR ASSEMBLY (MISCELLANEOUS) ×2 IMPLANT
GAUZE 4X4 16PLY ~~LOC~~+RFID DBL (SPONGE) ×2 IMPLANT
GLOVE BIOGEL PI IND STRL 7.0 (GLOVE) ×2 IMPLANT
GLOVE BIOGEL PI INDICATOR 7.0 (GLOVE) ×2
GLOVE ECLIPSE 6.5 STRL STRAW (GLOVE) ×4 IMPLANT
GLOVE SURG ENC MOIS LTX SZ6 (GLOVE) ×2 IMPLANT
GOWN STRL REUS W/ TWL LRG LVL3 (GOWN DISPOSABLE) ×2 IMPLANT
GOWN STRL REUS W/TWL LRG LVL3 (GOWN DISPOSABLE) ×4
HOSE CONNECTING 18IN BERKELEY (TUBING) ×2 IMPLANT
KIT BERKELEY 1ST TRI 3/8 NO TR (MISCELLANEOUS) ×2 IMPLANT
KIT BERKELEY 1ST TRIMESTER 3/8 (MISCELLANEOUS) ×2 IMPLANT
NS IRRIG 1000ML POUR BTL (IV SOLUTION) ×2 IMPLANT
PACK VAGINAL MINOR WOMEN LF (CUSTOM PROCEDURE TRAY) ×2 IMPLANT
PAD OB MATERNITY 4.3X12.25 (PERSONAL CARE ITEMS) ×2 IMPLANT
SET BERKELEY SUCTION TUBING (SUCTIONS) ×2 IMPLANT
SPIKE FLUID TRANSFER (MISCELLANEOUS) ×2 IMPLANT
TOWEL GREEN STERILE FF (TOWEL DISPOSABLE) ×2 IMPLANT
UNDERPAD 30X36 HEAVY ABSORB (UNDERPADS AND DIAPERS) ×2 IMPLANT
VACURETTE 10 RIGID CVD (CANNULA) IMPLANT
VACURETTE 7MM CVD STRL WRAP (CANNULA) IMPLANT
VACURETTE 8 RIGID CVD (CANNULA) IMPLANT
VACURETTE 9 RIGID CVD (CANNULA) ×1 IMPLANT

## 2022-02-25 NOTE — Anesthesia Preprocedure Evaluation (Signed)
Anesthesia Evaluation  Patient identified by MRN, date of birth, ID band Patient awake    Reviewed: Allergy & Precautions, NPO status , Patient's Chart, lab work & pertinent test results  History of Anesthesia Complications Negative for: history of anesthetic complications  Airway Mallampati: II  TM Distance: >3 FB Neck ROM: Full    Dental  (+) Dental Advisory Given, Teeth Intact   Pulmonary asthma ,    breath sounds clear to auscultation       Cardiovascular negative cardio ROS   Rhythm:Regular     Neuro/Psych  Headaches, negative psych ROS   GI/Hepatic negative GI ROS, Neg liver ROS,   Endo/Other  negative endocrine ROS  Renal/GU negative Renal ROS     Musculoskeletal   Abdominal   Peds  Hematology negative hematology ROS (+) Lab Results      Component                Value               Date                      WBC                      4.7                 02/25/2022                HGB                      12.1                02/25/2022                HCT                      36.5                02/25/2022                MCV                      86.5                02/25/2022                PLT                      211                 02/25/2022              Anesthesia Other Findings   Reproductive/Obstetrics negative OB ROS (+) Pregnancy Missed abortion                             Anesthesia Physical Anesthesia Plan  ASA: 2  Anesthesia Plan: General   Post-op Pain Management: Toradol IV (intra-op)* and Tylenol PO (pre-op)*   Induction: Intravenous  PONV Risk Score and Plan: 3 and Ondansetron and Dexamethasone  Airway Management Planned: LMA  Additional Equipment: None  Intra-op Plan:   Post-operative Plan: Extubation in OR  Informed Consent: I have reviewed the patients History and Physical, chart, labs and discussed the procedure including the risks, benefits and  alternatives for the proposed anesthesia with the patient or  authorized representative who has indicated his/her understanding and acceptance.     Dental advisory given  Plan Discussed with: CRNA  Anesthesia Plan Comments:         Anesthesia Quick Evaluation

## 2022-02-25 NOTE — Op Note (Signed)
Preop: Missed abortion Postop: Same Op: Dilation and evacuation Surgeon: Dr. Damita Dunnings Assist: None Anesthesia: LMA EBL 25 cc IVF: 500 cc  UOP: Not drained Complications: None  Findings: Normal anteverted uterus, products of conception noted  Description of the procedure: Preop antibiotics of doxycycline given. Informed consent reviewed and signed. Pt given opportunity to ask questions.   Pt prepped and draped in the dorsal lithotomy fashion after LMA anesthesia found to be adequate. Timeout performed.   Open-sided speculum placed into the vagina. Cervix grasped with a single tooth tenaculum. Cervix progressively dilated to a 27 pratt.  I used a 9 mm rigid curette. Two passes done with the suction curette and the tissue was retrieved. A gentle pass was done with a 1 curette. A gritty texture noted in all areas of the uterus confirming complete evacuation. Minimal bleeding noted. Procedure completed. All instruments removed. Counts correct x2.   Products of conception sent to pathology. Accepts genetics. Rh Positive - rhogam not indicated  Pt taken to recovery room in stable condition.  Radene Gunning, MD Attending [place], [facility] for [facility], [place]

## 2022-02-25 NOTE — Interval H&P Note (Signed)
History and Physical Interval Note:  02/25/2022 12:08 PM  Spray  has presented today for surgery, with the diagnosis of MAB.  The various methods of treatment have been discussed with the patient and family. After consideration of risks, benefits and other options for treatment, the patient has consented to  Procedure(s): DILATATION AND EVACUATION (N/A) as a surgical intervention.  The patient's history has been reviewed, patient examined, no change in status, stable for surgery.  I have reviewed the patient's chart and labs.  Questions were answered to the patient's satisfaction.    We discussed the indication for genetics and the patient agrees and consents to this as well.    Radene Gunning

## 2022-02-25 NOTE — Transfer of Care (Signed)
Immediate Anesthesia Transfer of Care Note  Patient: [name]  Procedure(s) Performed: DILATATION AND EVACUATION  Patient Location: PACU  Anesthesia Type:MAC  Level of Consciousness: awake, patient cooperative and responds to stimulation  Airway & Oxygen Therapy: Patient Spontanous Breathing and Patient connected to face mask oxygen  Post-op Assessment: Report given to RN and Post -op Vital signs reviewed and stable  Post vital signs: Reviewed and stable  Last Vitals:  Vitals Value Taken Time  BP 102/56 02/25/22 1342  Temp    Pulse 65 02/25/22 1343  Resp 21 02/25/22 1343  SpO2 100 % 02/25/22 1343  Vitals shown include unvalidated device data.  Last Pain:  Vitals:   02/25/22 1034  TempSrc:   PainSc: 0-No pain         Complications: No notable events documented.

## 2022-02-25 NOTE — BH Specialist Note (Signed)
Integrated Behavioral Health Initial In-Person Visit  MRN: [id number] Name: Lindsay Ramirez  Number of [place] Clinician visits: 1 Session Start time:   11:20am Session End time: 12:00pm Total time in minutes: 40 mins in person at [facility]   Types of Service: Individual psychotherapy  Interpretor:No. Interpretor Name and Language: none   Warm Hand Off Completed.        Subjective: Lindsay Ramirez is a 36 y.o. female accompanied by n/a Patient was referred by Malvin Johns RN for positive depression screen and pregnancy loss. 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   Assessment: Patient currently experiencing pregnancy loss, unsafe housing condition and stress.   Patient may benefit from integrated behavioral health.  Plan: Follow up with behavioral health clinician on : 03/25/2022 Behavioral recommendations: Consider engaging in a pregnancy loss group, contact [place] legal aid regarding unsafe housing conditions, prioritize rest, develop smart goals  Referral(s):  [place] (In Clinic) "From scale of 1-10, how likely are you to follow plan?":    Lynnea Ferrier, LCSW

## 2022-02-25 NOTE — OR Nursing (Signed)
Anora specimen and blood draw where obtained and sent out in kit via [employer].

## 2022-02-26 ENCOUNTER — Encounter (HOSPITAL_COMMUNITY): Payer: Self-pay | Admitting: Obstetrics and Gynecology

## 2022-02-26 LAB — SURGICAL PATHOLOGY

## 2022-02-26 NOTE — Anesthesia Postprocedure Evaluation (Signed)
Anesthesia Post Note  Patient: Jackilyn L Haigler  Procedure(s) Performed: DILATATION AND EVACUATION     Patient location during evaluation: PACU Anesthesia Type: General Level of consciousness: awake and alert Pain management: pain level controlled Vital Signs Assessment: post-procedure vital signs reviewed and stable Respiratory status: spontaneous breathing, nonlabored ventilation, respiratory function stable and patient connected to nasal cannula oxygen Cardiovascular status: blood pressure returned to baseline and stable Postop Assessment: no apparent nausea or vomiting Anesthetic complications: no   No notable events documented.  Last Vitals:  Vitals:   02/25/22 1400 02/25/22 1415  BP: 108/76 110/76  Pulse: 61 66  Resp: (!) 24 20  Temp: 36.4 C 36.4 C  SpO2: 100% 100%    Last Pain:  Vitals:   02/25/22 1415  TempSrc:   PainSc: 0-No pain                 Quintan Saldivar

## 2022-03-07 ENCOUNTER — Other Ambulatory Visit: Payer: Self-pay | Admitting: Family Medicine

## 2022-03-07 MED ORDER — FLUCONAZOLE 150 MG PO TABS: 150.0000 mg | ORAL_TABLET | Freq: Every day | ORAL | 0 refills | Status: AC

## 2022-03-07 MED ORDER — METRONIDAZOLE 500 MG PO TABS: 500.0000 mg | ORAL_TABLET | Freq: Two times a day (BID) | ORAL | 0 refills | Status: AC

## 2022-03-14 ENCOUNTER — Telehealth: Payer: Self-pay

## 2022-03-14 NOTE — Telephone Encounter (Signed)
Called pt with Fayrene Fearing results that show abnormal female result. [facility] genetic counseling phone number provided.

## 2022-03-25 ENCOUNTER — Ambulatory Visit: Payer: Medicaid Other | Admitting: Licensed Clinical Social Worker

## 2022-03-25 ENCOUNTER — Encounter: Payer: Medicaid Other | Admitting: Obstetrics and Gynecology

## 2022-03-25 ENCOUNTER — Ambulatory Visit (INDEPENDENT_AMBULATORY_CARE_PROVIDER_SITE_OTHER): Payer: Medicaid Other | Admitting: Licensed Clinical Social Worker

## 2022-03-25 DIAGNOSIS — F4321 Adjustment disorder with depressed mood: Secondary | ICD-10-CM | POA: Diagnosis not present

## 2022-03-25 NOTE — BH Specialist Note (Signed)
Integrated Behavioral Health via Telemedicine Visit  03/25/2022 Lindsay Ramirez [id number]  Number of Integrated Behavioral Health Clinician visits: 2- Second Visit Via phone per pt request  Session Start time: 0881   Session End time: 1031  Total time in minutes: 33 In person at [place]   Referring Provider: Dr. Cy Blamer  Patient/Family location: Home  [facility] Provider location: [place]  All persons participating in visit: Pt Lindsay Ramirez and LCSW A. Dal Blew  Types of Service: Individual psychotherapy and Telephone visit  I connected with [place] and/or [place] via  Telephone or Geologist, engineering  (Video is Caregility application) and verified that I am speaking with the correct person using two identifiers. Discussed confidentiality: Yes   I discussed the limitations of telemedicine and the availability of in person appointments.  Discussed there is a possibility of technology failure and discussed alternative modes of communication if that failure occurs.  I discussed that engaging in this telemedicine visit, they consent to the provision of behavioral healthcare and the services will be billed under their insurance.  Patient and/or legal guardian expressed understanding and consented to Telemedicine visit: Yes   Presenting Concerns: Patient and/or family reports the following symptoms/concerns: grief associated with pregnancy loss  Duration of problem: one month; Severity of problem: mild  Patient and/or Family'Lindsay Strengths/Protective Factors: Concrete supports in place (healthy food, safe environments, etc.)  Goals Addressed: Patient will:  Reduce symptoms of: depression   Increase knowledge and/or ability of: coping skills   Demonstrate ability to: Begin healthy grieving over loss  Progress towards Goals: Ongoing  Interventions: Interventions utilized:  Supportive Counseling Standardized Assessments completed: Not  Needed  Patient and/or Family Response: Lindsay Ramirez responded well to visit  Assessment: Patient currently experiencing grief of pregnancy loss.   Patient may benefit from integrated behavioral health.  Plan: Follow up with behavioral health clinician on : prn Behavioral recommendations: contact  legal aid regarding unsafe housing conditions, prioritize rest, develop smart goals  Referral(Lindsay): [place] (In Clinic)  I discussed the assessment and treatment plan with the patient and/or parent/guardian. They were provided an opportunity to ask questions and all were answered. They agreed with the plan and demonstrated an understanding of the instructions.   They were advised to call back or seek an in-person evaluation if the symptoms worsen or if the condition fails to improve as anticipated.  Lynnea Ferrier, LCSW

## 2022-04-25 ENCOUNTER — Emergency Department (HOSPITAL_COMMUNITY)
Admission: EM | Admit: 2022-04-25 | Discharge: 2022-04-25 | Discharge status: Against Medical Advice | Payer: Medicaid Other | Attending: Emergency Medicine | Admitting: Emergency Medicine

## 2022-04-25 ENCOUNTER — Encounter (HOSPITAL_COMMUNITY): Payer: Self-pay | Admitting: Emergency Medicine

## 2022-04-25 ENCOUNTER — Other Ambulatory Visit: Payer: Self-pay

## 2022-04-25 DIAGNOSIS — R519 Headache, unspecified: Secondary | ICD-10-CM | POA: Diagnosis not present

## 2022-04-25 DIAGNOSIS — Z5321 Procedure and treatment not carried out due to patient leaving prior to being seen by health care provider: Secondary | ICD-10-CM | POA: Diagnosis not present

## 2022-04-25 DIAGNOSIS — R059 Cough, unspecified: Secondary | ICD-10-CM | POA: Insufficient documentation

## 2022-04-25 DIAGNOSIS — A64 Unspecified sexually transmitted disease: Secondary | ICD-10-CM | POA: Insufficient documentation

## 2022-04-25 LAB — URINALYSIS, ROUTINE W REFLEX MICROSCOPIC
Bacteria, UA: NONE SEEN
Bilirubin Urine: NEGATIVE
Glucose, UA: NEGATIVE mg/dL
Ketones, ur: NEGATIVE mg/dL
Leukocytes,Ua: NEGATIVE
Nitrite: NEGATIVE
Protein, ur: NEGATIVE mg/dL
Specific Gravity, Urine: 1.015 (ref 1.005–1.030)
pH: 5 (ref 5.0–8.0)

## 2022-04-25 LAB — PREGNANCY, URINE: Preg Test, Ur: NEGATIVE

## 2022-04-25 NOTE — ED Notes (Signed)
Patient states wait is too long and she is leaving

## 2022-04-25 NOTE — ED Triage Notes (Signed)
Patient requesting STD screening reports partner has STD , denies any symptoms ,she adds persistent cough and headache .

## 2022-08-04 ENCOUNTER — Other Ambulatory Visit: Payer: Self-pay

## 2022-08-04 ENCOUNTER — Emergency Department (HOSPITAL_COMMUNITY)
Admission: EM | Admit: 2022-08-04 | Discharge: 2022-08-05 | Disposition: A | Discharge status: Home / Self Care | Payer: Medicaid Other | Attending: Emergency Medicine | Admitting: Emergency Medicine

## 2022-08-04 ENCOUNTER — Encounter (HOSPITAL_COMMUNITY): Payer: Self-pay

## 2022-08-04 DIAGNOSIS — W19XXXA Unspecified fall, initial encounter: Secondary | ICD-10-CM

## 2022-08-04 DIAGNOSIS — S0990XA Unspecified injury of head, initial encounter: Secondary | ICD-10-CM | POA: Insufficient documentation

## 2022-08-04 DIAGNOSIS — W541XXA Struck by dog, initial encounter: Secondary | ICD-10-CM | POA: Insufficient documentation

## 2022-08-04 NOTE — ED Triage Notes (Signed)
Patient BIB [facility] from home. Was running from a dog and hit the left bottom back of her head and left shoulder on a wooden pole. No LOC. No blood thinners. C collar on arrival.

## 2022-08-05 MED ORDER — CYCLOBENZAPRINE HCL 10 MG PO TABS: 10.0000 mg | ORAL_TABLET | Freq: Three times a day (TID) | ORAL | 0 refills | Status: DC | PRN

## 2022-08-05 MED ORDER — IBUPROFEN 600 MG PO TABS: 600.0000 mg | ORAL_TABLET | Freq: Four times a day (QID) | ORAL | 0 refills | Status: DC | PRN

## 2022-08-05 NOTE — ED Provider Notes (Incomplete)
  [facility] Emergency Department Provider Note MRN:  [id number]  Arrival date & time: 08/05/22     Chief Complaint   Fall   History of Present Illness   Lindsay Ramirez is a 36 y.o. year-old female presents to the ED with chief complaint of ***.  {rbhistorian:27070} {RB interpreter (Optional):27221}  Review of Systems  Pertinent positive and negative review of systems noted in HPI.    Physical Exam   Vitals:   08/04/22 1719 08/04/22 2332  BP: (!) 135/99 115/80  Pulse: 83 (!) 56  Resp: 15 17  Temp: 98 F (36.7 C) 98 F (36.7 C)  SpO2: 100% 96%    CONSTITUTIONAL:  ***-appearing, NAD NEURO:  Alert and oriented x 3, CN 3-12 grossly intact*** EYES:  eyes equal and reactive ENT/NECK:  Supple, no stridor *** CARDIO:  ***, ***regular rhythm, appears well-perfused *** PULM:  No respiratory distress***, *** GI/GU:  non-distended, *** MSK/SPINE:  No gross deformities, no edema, moves all extremities *** SKIN:  no rash, atraumatic***   *Additional and/or pertinent findings included in MDM below  Diagnostic and Interventional Summary    EKG Interpretation  Date/Time:    Ventricular Rate:    PR Interval:    QRS Duration:   QT Interval:    QTC Calculation:   R Axis:     Text Interpretation:        *** Labs Reviewed - No data to display  No orders to display    Medications - No data to display   Procedures  /  Critical Care Procedures  ED Course and Medical Decision Making  I have reviewed the triage vital signs, the nursing notes, and pertinent available records from the EMR.  Social Determinants Affecting Complexity of Care: Patient {rbSocial Determinants:27067}. {rbsocialsolutions:27068}  ED Course:    Medical Decision Making Risk Prescription drug management.     Consultants: {rbconsultants:27072}   Treatment and Plan: {rbadmissionvdc:27069}  {rbattending:27073}  Final Clinical Impressions(s) / ED Diagnoses      ICD-10-CM   1. Fall, initial encounter  W19.XXXA     2. Injury of head, initial encounter  S09.90XA       ED Discharge Orders          Ordered    ibuprofen (ADVIL) 600 MG tablet  Every 6 hours PRN        08/05/22 0003    cyclobenzaprine (FLEXERIL) 10 MG tablet  3 times daily PRN        08/05/22 0003              Discharge Instructions Discussed with and Provided to Patient:   Discharge Instructions   None

## 2022-08-05 NOTE — ED Provider Notes (Signed)
[facility] Emergency Department Provider Note MRN:  [id number]  Arrival date & time: 08/05/22     Chief Complaint   Fall   History of Present Illness   Lindsay Ramirez is a 36 y.o. year-old female presents to the ED with chief complaint of fall.  She states that she was pulled down by her dog and her face and shoulder hit a pull this afternoon.  She denies LOC.  Was BIB EMS in a c-collar.  She states that she doesn't feel like anything is broken.  She is not anticoagulated.  Denies other injuries.  History provided by patient.   Review of Systems  Pertinent positive and negative review of systems noted in HPI.    Physical Exam   Vitals:   08/04/22 1719 08/04/22 2332  BP: (!) 135/99 115/80  Pulse: 83 (!) 56  Resp: 15 17  Temp: 98 F (36.7 C) 98 F (36.7 C)  SpO2: 100% 96%    CONSTITUTIONAL:  well-appearing, NAD NEURO:  Alert and oriented x 3, CN 3-12 grossly intact EYES:  eyes equal and reactive ENT/NECK:  Supple, no stridor, no midline c-spine tenderness, no mandible tenderness, normal ROM CARDIO:  normal rate, regular rhythm, appears well-perfused  PULM:  No respiratory distress, CTAB GI/GU:  non-distended,  MSK/SPINE:  No gross deformities, no edema, moves all extremities, normal overhead ROM SKIN:  no rash, atraumatic   *Additional and/or pertinent findings included in MDM below  Diagnostic and Interventional Summary    EKG Interpretation  Date/Time:    Ventricular Rate:    PR Interval:    QRS Duration:   QT Interval:    QTC Calculation:   R Axis:     Text Interpretation:         Labs Reviewed - No data to display  No orders to display    Medications - No data to display   Procedures  /  Critical Care Procedures  ED Course and Medical Decision Making  I have reviewed the triage vital signs, the nursing notes, and pertinent available records from the EMR.  Social Determinants Affecting Complexity of Care: Patient  has no clinically significant social determinants affecting this chief complaint..   ED Course:    Medical Decision Making Patient here with a fall.  A dog pulled on the leash causing patient to hit head and shoulder on a wooden pole.  Nexus c-spine criteria used to clear c-spine (patient had already removed c-collar). [place] head CT rules used to clear head, no advanced imaging needed.  Normal ROM of left shoulder without bony deformity or abnormality.  Doubt fx or dislocation.  Feel that patient can be safely discharge home at this point.  She has been in the ED for >7 hours without any deterioration.  PCP follow-up.  Risk Prescription drug management.     Consultants: No consultations were needed in caring for this patient.   Treatment and Plan: Emergency department workup does not suggest an emergent condition requiring admission or immediate intervention beyond  what has been performed at this time. The patient is safe for discharge and has  been instructed to return immediately for worsening symptoms, change in  symptoms or any other concerns    Final Clinical Impressions(s) / ED Diagnoses     ICD-10-CM   1. Fall, initial encounter  W19.XXXA     2. Injury of head, initial encounter  S09.90XA       ED Discharge Orders  Ordered    ibuprofen (ADVIL) 600 MG tablet  Every 6 hours PRN        08/05/22 0003    cyclobenzaprine (FLEXERIL) 10 MG tablet  3 times daily PRN        08/05/22 0003              Discharge Instructions Discussed with and Provided to Patient:   Discharge Instructions   None      Montine Circle, PA-C 08/05/22 0019    Maudie Flakes, MD 08/05/22 [phone]

## 2022-11-11 ENCOUNTER — Encounter: Payer: Self-pay | Admitting: Family Medicine

## 2022-11-11 ENCOUNTER — Ambulatory Visit (INDEPENDENT_AMBULATORY_CARE_PROVIDER_SITE_OTHER): Payer: Medicaid Other | Admitting: Family Medicine

## 2022-11-11 VITALS — BP 122/80 | HR 60 | Ht 62.0 in | Wt 195.6 lb

## 2022-11-11 DIAGNOSIS — Z319 Encounter for procreative management, unspecified: Secondary | ICD-10-CM | POA: Diagnosis not present

## 2022-11-11 DIAGNOSIS — N96 Recurrent pregnancy loss: Secondary | ICD-10-CM

## 2022-11-11 NOTE — Progress Notes (Signed)
Pt present for conception consult. Pt has been trying to conceive since SAB 02/25/22. LMP 11-07-22. Pt feels "something is wrong" because she has been trying to consecutive since SAB. Denies any abnormal pain. Pt states periods have always been irregular.

## 2022-11-11 NOTE — Progress Notes (Signed)
GYNECOLOGY OFFICE VISIT NOTE  History:   Lindsay Ramirez is a 37 y.o. [id number] here today for concerns about conception. She has had 2 successive spontaneous abortions over the last 2 years. She is concerned about the difficulty conceiving a new baby, as well as the recurrent abortions. She denies any abnormal vaginal discharge, bleeding, pelvic pain or other concerns. She has 7 live children, some of whom are with her. Youngest child's father is with her and she is desiring more children. Menstrual periods are sometimes irregular. LMP 11/07/2022. She had D&Cs following the last 2 SABs. No other symptoms.   Past Medical History:  Diagnosis Date   Anemia    CHRONIC   Anxiety    Asthma    rarely uses inhaler   Bipolar affective disorder ([place])    COVID-19 09/2020   Depression    PP 2013   Gout    feet - no meds   Headache(784.0)    otc med prn   Sickle cell trait ([place])    Vaginal Pap smear, abnormal     Past Surgical History:  Procedure Laterality Date   CESAREAN SECTION MULTI-GESTATIONAL N/A 09/19/2014   Procedure: CESAREAN SECTION MULTI-GESTATIONAL;  Surgeon: Shelly Bombard, MD;  Location: [facility];  Service: Obstetrics;  Laterality: N/A;   DILATION AND CURETTAGE OF UTERUS     DILATION AND EVACUATION N/A 11/04/2013   Procedure: DILATATION AND EVACUATION;  Surgeon: Alwyn Pea, MD;  Location: [facility];  Service: Gynecology;  Laterality: N/A;   DILATION AND EVACUATION N/A 09/13/2021   Procedure: DILATATION AND EVACUATION;  Surgeon: Radene Gunning, MD;  Location: [facility];  Service: Gynecology;  Laterality: N/A;   DILATION AND EVACUATION N/A 02/25/2022   Procedure: DILATATION AND EVACUATION;  Surgeon: Radene Gunning, MD;  Location: [facility];  Service: Gynecology;  Laterality: N/A;   [place] EXTRACTION  2012    The following portions of the patient's history were reviewed and updated as appropriate: allergies, current medications, past family history, past medical history, past social  history, past surgical history and problem list.   Health Maintenance:  Normal pap and negative HRHPV on 12/2021.   Review of Systems:  Pertinent items noted in HPI and remainder of comprehensive ROS otherwise negative.  Physical Exam:  BP 122/80   Pulse 60   Ht '5\' 2"'$  (1.575 m)   Wt 195 lb 9.6 oz (88.7 kg)   LMP 11/07/2022   Breastfeeding Unknown   BMI 35.78 kg/m  CONSTITUTIONAL: Well-developed, well-nourished female in no acute distress.  HEENT:  Normocephalic, atraumatic.  NECK: Normal range of motion, supple, no masses noted on observation SKIN: No rash noted. Not diaphoretic. No erythema. No pallor. NEUROLOGIC: Alert and oriented to person, place, and time. Normal muscle tone coordination. No cranial nerve deficit noted. PSYCHIATRIC: Normal mood and affect. Normal behavior. Normal judgment and thought content. CARDIOVASCULAR: Normal heart rate noted RESPIRATORY: Effort and breath sounds normal, no problems with respiration noted ABDOMEN: No masses noted. No other overt distention noted.   PELVIC: Deferred  Labs and Imaging No results found for this or any previous visit (from the past 168 hour(s)). No results found.    Assessment and Plan:  Patient desires pregnancy -     US PELVIC COMPLETE WITH TRANSVAGINAL; Future  History of recurrent abortion, not currently pregnant -     Lupus anticoagulant panel -     Cardiolipin antibodies, IgM+IgG -     APTT -  Beta 2 microglobulin, serum -     TSH Rfx on Abnormal to Free T4 -     Beta-2 Glycoprotein I Ab,G/M  37 y.o. [id number] with a desire to conceive and a history of recurrent SAB. We discussed possibly etiologies, including decreased fertility with age, genetic malformations leading to SAB - initial one in 2022 was noted to have a Trisomy; autoimmune dx like thyroid d/o and antiphospholipid syndrome, uterine synechia and other uterine anomalies like fibroids. Will get labs and [place] to evaluate further.  Routine preventative  health maintenance measures emphasized. Please refer to After Visit Summary for other counseling recommendations.   Follow up in 2 weeks after labs and [place].  I spent  36  minutes dedicated to the care of this patient including pre-visit review of records, face to face time with the patient discussing her conditions and treatments and post visit orders.  Lindsay Channel MD MPH OB Fellow, [place] for [place] 11/17/2022

## 2022-11-13 LAB — CARDIOLIPIN ANTIBODIES, IGM+IGG
Anticardiolipin IgG: <9 GPL U/mL (ref 0–14)
Anticardiolipin IgM: <9 MPL U/mL (ref 0–12)

## 2022-11-13 LAB — BETA-2 GLYCOPROTEIN I AB,G/M
Beta-2 Glyco 1 IgM: <9 GPI IgM units (ref 0–32)
Beta-2 Glyco I IgG: <9 GPI IgG units (ref 0–20)

## 2022-11-13 LAB — APTT: aPTT: 28 s (ref 24–33)

## 2022-11-13 LAB — BETA 2 MICROGLOBULIN, SERUM: Beta-2: 1.4 mg/L (ref 0.6–2.4)

## 2022-11-13 LAB — LUPUS ANTICOAGULANT PANEL
Dilute Viper Venom Time: 36.2 s (ref 0.0–47.0)
PTT Lupus Anticoagulant: 37.9 s (ref 0.0–43.5)

## 2022-11-13 LAB — TSH RFX ON ABNORMAL TO FREE T4: TSH: 1.16 u[IU]/mL (ref 0.450–4.500)

## 2022-11-17 ENCOUNTER — Encounter: Payer: Self-pay | Admitting: Family Medicine

## 2022-11-17 ENCOUNTER — Ambulatory Visit (HOSPITAL_BASED_OUTPATIENT_CLINIC_OR_DEPARTMENT_OTHER)
Admission: RE | Admit: 2022-11-17 | Discharge: 2022-11-17 | Disposition: A | Discharge status: Home / Self Care | Payer: Medicaid Other | Source: Ambulatory Visit | Attending: Family Medicine | Admitting: Family Medicine

## 2022-11-17 DIAGNOSIS — Z319 Encounter for procreative management, unspecified: Secondary | ICD-10-CM | POA: Diagnosis not present

## 2022-12-04 ENCOUNTER — Ambulatory Visit: Payer: Medicaid Other | Admitting: Family Medicine

## 2022-12-04 ENCOUNTER — Encounter: Payer: Self-pay | Admitting: Family Medicine

## 2022-12-04 VITALS — BP 114/74 | HR 63 | Ht 61.0 in | Wt 199.0 lb

## 2022-12-04 DIAGNOSIS — Z3169 Encounter for other general counseling and advice on procreation: Secondary | ICD-10-CM

## 2022-12-04 DIAGNOSIS — Z319 Encounter for procreative management, unspecified: Secondary | ICD-10-CM

## 2022-12-04 MED ORDER — CLOMIPHENE CITRATE 50 MG PO TABS: ORAL_TABLET | ORAL | 3 refills | Status: AC

## 2022-12-04 NOTE — Progress Notes (Signed)
GYNECOLOGY OFFICE VISIT NOTE  History:   Lindsay Ramirez is a 37 y.o. [id number] here today for follow-up on concerns about trying to conceive.  She was last seen on 11/11/22  Labs ordered to rule out antiphospholipid syndrome and thyroid disease, all came back negative.  Also had a pelvic ultrasound which showed a very small leiomyoma, otherwise normal. She denies any abnormal vaginal discharge, bleeding, pelvic pain or other concerns.    Past Medical History:  Diagnosis Date   Anemia    CHRONIC   Anxiety    Asthma    rarely uses inhaler   Bipolar affective disorder ([place])    COVID-19 09/2020   Depression    PP 2013   Gout    feet - no meds   Headache(784.0)    otc med prn   Sickle cell trait ([place])    Vaginal Pap smear, abnormal     Past Surgical History:  Procedure Laterality Date   CESAREAN SECTION MULTI-GESTATIONAL N/A 09/19/2014   Procedure: CESAREAN SECTION MULTI-GESTATIONAL;  Surgeon: Shelly Bombard, MD;  Location: [facility];  Service: Obstetrics;  Laterality: N/A;   DILATION AND CURETTAGE OF UTERUS     DILATION AND EVACUATION N/A 11/04/2013   Procedure: DILATATION AND EVACUATION;  Surgeon: Alwyn Pea, MD;  Location: [facility];  Service: Gynecology;  Laterality: N/A;   DILATION AND EVACUATION N/A 09/13/2021   Procedure: DILATATION AND EVACUATION;  Surgeon: Radene Gunning, MD;  Location: [facility];  Service: Gynecology;  Laterality: N/A;   DILATION AND EVACUATION N/A 02/25/2022   Procedure: DILATATION AND EVACUATION;  Surgeon: Radene Gunning, MD;  Location: [facility];  Service: Gynecology;  Laterality: N/A;   [place] EXTRACTION  2012    The following portions of the patient's history were reviewed and updated as appropriate: allergies, current medications, past family history, past medical history, past social history, past surgical history and problem list.    Review of Systems:  Pertinent items noted in HPI and remainder of comprehensive ROS otherwise negative.  Physical  Exam:  BP 114/74   Pulse 63   Ht 5\' 1"  (1.549 m)   Wt 199 lb (90.3 kg)   LMP 12/03/2022   Breastfeeding No   BMI 37.60 kg/m   CONSTITUTIONAL: Well-developed, well-nourished female in no acute distress.  CARDIOVASCULAR: Normal heart rate noted RESPIRATORY: Effort and breath sounds normal, no problems with respiration noted ABDOMEN: No masses noted. No other overt distention noted.   PELVIC: Deferred  Labs and Imaging No results found for this or any previous visit (from the past 168 hour(s)). US PELVIC COMPLETE WITH TRANSVAGINAL  Result Date: 11/17/2022 CLINICAL DATA:  Recurrent abortions, trying to conceive, question Asherman's syndrome or other uterine abnormalities, history of D&C x2, LMP 11/07/2022 EXAM: TRANSABDOMINAL AND TRANSVAGINAL ULTRASOUND OF PELVIS TECHNIQUE: Both transabdominal and transvaginal ultrasound examinations of the pelvis were performed. Transabdominal technique was performed for global imaging of the pelvis including uterus, ovaries, adnexal regions, and pelvic cul-de-sac. It was necessary to proceed with endovaginal exam following the transabdominal exam to visualize the endometrium and ovaries. COMPARISON:  09/05/2008 FINDINGS: Uterus Measurements: 9.2 x 4.4 x 4.5 cm = volume: 96 mL. Anteverted. Anterior wall Caesarean section scar present. Single small intramural leiomyoma posterior upper uterus 14 mm greatest diameter. Endometrium Thickness: 4 mm.  No endometrial fluid or mass Right ovary Measurements: 3.6 x 1.7 x 3.8 cm = volume: 12.2 mL. Normal morphology without mass Left ovary Measurements: 3.7 x 2.0 x 3.9 cm =  volume: 14.8 mL. Normal morphology without mass Other findings No free pelvic fluid or adnexal masses. IMPRESSION: Single small intramural leiomyoma posterior upper uterus 14 mm diameter. Otherwise negative exam. Electronically Signed   By: Lavonia Dana M.D.   On: 11/17/2022 17:18      Assessment and Plan:   Patient desires pregnancy -     clomiPHENE  Citrate; Take 1 tablet daily for 5 days, starting from day 5 of the menstrual cycle.  Dispense: 5 tablet; Refill: 3  Pre-conception counseling  Reviewed labs and imaging, all within normal limit. No evidence of thyroid dx, phospholipid syndrome, or uterine synechiae. Has regular menstrual periods and consistent cycle lengths, so is likely ovulating.  We discussed options of timed intercourse and allowing for some more time for pregnancy to occur, possible seminal analysis testing for partner and further testing for patient.  She would rather not wait and declines further testing. She is desiring to try ovulation induction with clomiphene. Its unclear if it will beneficial, but worth a try.   Rx sent. Recommend taking a 5 day course each month, starting on day 5 of her cycle. Can repeat if not pregnant. Encouraged timed intercourse  Follow up in 3 months with ObGyn.    I spent 25 minutes dedicated to the care of this patient including pre-visit review of records, face to face time with the patient discussing her conditions and treatments and post visit orders.  Liliane Channel MD MPH OB Fellow, [place] for [place] 12/04/2022

## 2022-12-04 NOTE — Progress Notes (Signed)
Pt presents for f/u. Pt has been trying to conceive without success. Labs drawn at visit on 11/11/22. No new concerns.

## 2023-05-28 ENCOUNTER — Emergency Department (HOSPITAL_COMMUNITY)
Admission: EM | Admit: 2023-05-28 | Discharge: 2023-05-29 | Disposition: A | Discharge status: Home / Self Care | Payer: Medicaid Other | Attending: Emergency Medicine | Admitting: Emergency Medicine

## 2023-05-28 ENCOUNTER — Other Ambulatory Visit: Payer: Self-pay

## 2023-05-28 ENCOUNTER — Encounter (HOSPITAL_COMMUNITY): Payer: Self-pay

## 2023-05-28 DIAGNOSIS — E876 Hypokalemia: Secondary | ICD-10-CM | POA: Insufficient documentation

## 2023-05-28 DIAGNOSIS — R600 Localized edema: Secondary | ICD-10-CM | POA: Diagnosis not present

## 2023-05-28 DIAGNOSIS — M7989 Other specified soft tissue disorders: Secondary | ICD-10-CM | POA: Diagnosis present

## 2023-05-28 LAB — CBC WITH DIFFERENTIAL/PLATELET
Abs Immature Granulocytes: 0.03 10*3/uL (ref 0.00–0.07)
Basophils Absolute: 0 10*3/uL (ref 0.0–0.1)
Basophils Relative: 0 %
Eosinophils Absolute: 0.2 10*3/uL (ref 0.0–0.5)
Eosinophils Relative: 2 %
HCT: 35.3 % — ABNORMAL LOW (ref 36.0–46.0)
Hemoglobin: 11.1 g/dL — ABNORMAL LOW (ref 12.0–15.0)
Immature Granulocytes: 0 %
Lymphocytes Relative: 29 %
Lymphs Abs: 2.4 10*3/uL (ref 0.7–4.0)
MCH: 26.8 pg (ref 26.0–34.0)
MCHC: 31.4 g/dL (ref 30.0–36.0)
MCV: 85.3 fL (ref 80.0–100.0)
Monocytes Absolute: 0.7 10*3/uL (ref 0.1–1.0)
Monocytes Relative: 8 %
Neutro Abs: 4.8 10*3/uL (ref 1.7–7.7)
Neutrophils Relative %: 61 %
Platelets: 291 10*3/uL (ref 150–400)
RBC: 4.14 MIL/uL (ref 3.87–5.11)
RDW: 12.7 % (ref 11.5–15.5)
WBC: 8.1 10*3/uL (ref 4.0–10.5)
nRBC: 0 % (ref 0.0–0.2)

## 2023-05-28 LAB — BASIC METABOLIC PANEL
Anion gap: 9 (ref 5–15)
BUN: 5 mg/dL — ABNORMAL LOW (ref 6–20)
CO2: 21 mmol/L — ABNORMAL LOW (ref 22–32)
Calcium: 9.3 mg/dL (ref 8.9–10.3)
Chloride: 107 mmol/L (ref 98–111)
Creatinine, Ser: 0.82 mg/dL (ref 0.44–1.00)
GFR, Estimated: >60 mL/min (ref >60)
Glucose, Bld: 112 mg/dL — ABNORMAL HIGH (ref 70–99)
Potassium: 3.1 mmol/L — ABNORMAL LOW (ref 3.5–5.1)
Sodium: 137 mmol/L (ref 135–145)

## 2023-05-28 NOTE — ED Triage Notes (Signed)
Pt c/o feet edemax2d. Pt has 2+ edema of feet bilat.

## 2023-05-29 MED ORDER — POTASSIUM CHLORIDE CRYS ER 20 MEQ PO TBCR
30.0000 meq | EXTENDED_RELEASE_TABLET | Freq: Once | ORAL | Status: AC
  Administered 2023-05-29: 30 meq via ORAL
  Filled 2023-05-29: qty 1

## 2023-05-29 NOTE — ED Provider Notes (Signed)
[place] EMERGENCY DEPARTMENT AT [facility] Provider Note   CSN: [id number] Arrival date & time: 05/28/23  1841     History  Chief Complaint  Patient presents with   Foot Swelling    Lindsay Ramirez is a 37 y.o. female.  The history is provided by the patient.  Illness Location:  B feet Quality:  Swelling Severity:  Mild Onset quality:  Gradual Duration:  2 days Timing:  Constant Progression:  Unchanged Chronicity:  New Context:  Homeless, walks a lot Relieved by:  Nothing Ineffective treatments:  None Associated symptoms: no abdominal pain, no chest pain, no fever and no wheezing        Home Medications Prior to Admission medications   Medication Sig Start Date End Date Taking? Authorizing Provider  clomiPHENE (CLOMID) 50 MG tablet Take 1 tablet daily for 5 days, starting from day 5 of the menstrual cycle. 12/04/22   Ndulue, Nadene Rubins, MD      Allergies    Patient has no known allergies.    Review of Systems   Review of Systems  Constitutional:  Negative for fever.  HENT:  Negative for facial swelling.   Eyes:  Negative for redness.  Respiratory:  Negative for wheezing and stridor.   Cardiovascular:  Negative for chest pain.  Gastrointestinal:  Negative for abdominal pain.  All other systems reviewed and are negative.   Physical Exam Updated Vital Signs BP 130/83 (BP Location: Right Arm)   Pulse 67   Temp 98.5 F (36.9 C)   Resp 17   Ht 5\' 1"  (1.549 m)   Wt 90.3 kg   SpO2 100%   BMI 37.62 kg/m  Physical Exam Vitals and nursing note reviewed.  Constitutional:      General: She is not in acute distress.    Appearance: Normal appearance. She is well-developed.  HENT:     Head: Normocephalic and atraumatic.     Nose: Nose normal.  Eyes:     Pupils: Pupils are equal, round, and reactive to light.  Cardiovascular:     Rate and Rhythm: Normal rate and regular rhythm.     Pulses: Normal pulses.     Heart sounds: Normal heart  sounds.  Pulmonary:     Effort: Pulmonary effort is normal. No respiratory distress.     Breath sounds: Normal breath sounds.  Abdominal:     General: Bowel sounds are normal. There is no distension.     Palpations: Abdomen is soft.     Tenderness: There is no abdominal tenderness. There is no guarding or rebound.  Musculoskeletal:        General: Normal range of motion.     Cervical back: Neck supple.     Comments: Trace pedal edema.  No wounds intact pulses  Skin:    General: Skin is warm and dry.     Capillary Refill: Capillary refill takes less than 2 seconds.     Findings: No erythema or rash.  Neurological:     General: No focal deficit present.     Mental Status: She is alert and oriented to person, place, and time.     Deep Tendon Reflexes: Reflexes normal.  Psychiatric:        Mood and Affect: Mood normal.     ED Results / Procedures / Treatments   Labs (all labs ordered are listed, but only abnormal results are displayed) Results for orders placed or performed during the hospital encounter of 05/28/23  Basic metabolic panel  Result Value Ref Range   Sodium 137 135 - 145 mmol/L   Potassium 3.1 (L) 3.5 - 5.1 mmol/L   Chloride 107 98 - 111 mmol/L   CO2 21 (L) 22 - 32 mmol/L   Glucose, Bld 112 (H) 70 - 99 mg/dL   BUN 5 (L) 6 - 20 mg/dL   Creatinine, Ser 1.61 0.44 - 1.00 mg/dL   Calcium 9.3 8.9 - 09.6 mg/dL   GFR, Estimated >04 >54 mL/min   Anion gap 9 5 - 15  CBC with Differential  Result Value Ref Range   WBC 8.1 4.0 - 10.5 K/uL   RBC 4.14 3.87 - 5.11 MIL/uL   Hemoglobin 11.1 (L) 12.0 - 15.0 g/dL   HCT 09.8 (L) 11.9 - 14.7 %   MCV 85.3 80.0 - 100.0 fL   MCH 26.8 26.0 - 34.0 pg   MCHC 31.4 30.0 - 36.0 g/dL   RDW 82.9 56.2 - 13.0 %   Platelets 291 150 - 400 K/uL   nRBC 0.0 0.0 - 0.2 %   Neutrophils Relative % 61 %   Neutro Abs 4.8 1.7 - 7.7 K/uL   Lymphocytes Relative 29 %   Lymphs Abs 2.4 0.7 - 4.0 K/uL   Monocytes Relative 8 %   Monocytes Absolute 0.7  0.1 - 1.0 K/uL   Eosinophils Relative 2 %   Eosinophils Absolute 0.2 0.0 - 0.5 K/uL   Basophils Relative 0 %   Basophils Absolute 0.0 0.0 - 0.1 K/uL   Immature Granulocytes 0 %   Abs Immature Granulocytes 0.03 0.00 - 0.07 K/uL   No results found.  Radiology No results found.  Procedures Procedures    Medications Ordered in ED Medications  potassium chloride (KLOR-CON M) CR tablet 30 mEq (has no administration in time range)    ED Course/ Medical Decision Making/ A&P                                 Medical Decision Making Feet swelling x 2 days   Amount and/or Complexity of Data Reviewed Labs: ordered.    Details: Normal white count 8.1, hemoglobin slight low 11.1, normal platelets.  Normal sodium 137, potassium slight low 3.1, normal creatinine   Risk Risk Details: Patient with pedal edema from walking will start compression stockings.  Potassium given.  Stable for discharge.  Strict return     Final Clinical Impression(s) / ED Diagnoses Final diagnoses:  Pedal edema   Return for intractable cough, coughing up blood, fevers > 100.4 unrelieved by medication, shortness of breath, intractable vomiting, chest pain, shortness of breath, weakness, numbness, changes in speech, facial asymmetry, abdominal pain, passing out, Inability to tolerate liquids or food, cough, altered mental status or any concerns. No signs of systemic illness or infection. The patient is nontoxic-appearing on exam and vital signs are within normal limits.  I have reviewed the triage vital signs and the nursing notes. Pertinent labs & imaging results that were available during my care of the patient were reviewed by me and considered in my medical decision making (see chart for details). After history, exam, and medical workup I feel the patient has been appropriately medically screened and is safe for discharge home. Pertinent diagnoses were discussed with the patient. Patient was given return  precautions.    Rx / DC Orders ED Discharge Orders          Ordered  Compression stockings        05/29/23 0002              Denaly Gatling, MD 05/29/23 0981

## 2023-08-26 ENCOUNTER — Ambulatory Visit (HOSPITAL_COMMUNITY): Payer: Medicaid Other
